# Patient Record
Sex: Female | Born: 1970 | Race: Black or African American | Hispanic: No | Marital: Single | Smoking: Current every day smoker
Health system: Southern US, Community
[De-identification: ages and names within clinical notes are randomized; demographics above are authoritative.]

## PROBLEM LIST (undated history)

## (undated) ENCOUNTER — Ambulatory Visit (HOSPITAL_COMMUNITY): Source: Home / Self Care

## (undated) DIAGNOSIS — F32A Depression, unspecified: Secondary | ICD-10-CM

## (undated) DIAGNOSIS — J45909 Unspecified asthma, uncomplicated: Secondary | ICD-10-CM

## (undated) DIAGNOSIS — F419 Anxiety disorder, unspecified: Secondary | ICD-10-CM

## (undated) DIAGNOSIS — G43019 Migraine without aura, intractable, without status migrainosus: Secondary | ICD-10-CM

## (undated) DIAGNOSIS — G4730 Sleep apnea, unspecified: Secondary | ICD-10-CM

## (undated) DIAGNOSIS — F329 Major depressive disorder, single episode, unspecified: Secondary | ICD-10-CM

## (undated) DIAGNOSIS — E785 Hyperlipidemia, unspecified: Secondary | ICD-10-CM

## (undated) DIAGNOSIS — I10 Essential (primary) hypertension: Secondary | ICD-10-CM

## (undated) HISTORY — DX: Hyperlipidemia, unspecified: E78.5

## (undated) HISTORY — DX: Unspecified asthma, uncomplicated: J45.909

## (undated) HISTORY — DX: Sleep apnea, unspecified: G47.30

## (undated) HISTORY — PX: OTHER SURGICAL HISTORY: SHX169

## (undated) HISTORY — PX: TUBAL LIGATION: SHX77

## (undated) HISTORY — DX: Migraine without aura, intractable, without status migrainosus: G43.019

## (undated) HISTORY — PX: CHOLECYSTECTOMY: SHX55

---

## 1997-06-23 ENCOUNTER — Inpatient Hospital Stay (HOSPITAL_COMMUNITY): Admission: AD | Admit: 1997-06-23 | Discharge: 1997-06-23 | Payer: Self-pay | Admitting: Family Medicine

## 1998-03-22 ENCOUNTER — Emergency Department (HOSPITAL_COMMUNITY): Admission: EM | Admit: 1998-03-22 | Discharge: 1998-03-22 | Payer: Self-pay | Admitting: Emergency Medicine

## 1998-04-29 ENCOUNTER — Inpatient Hospital Stay (HOSPITAL_COMMUNITY): Admission: AD | Admit: 1998-04-29 | Discharge: 1998-04-29 | Payer: Self-pay | Admitting: Family Medicine

## 1998-04-29 ENCOUNTER — Encounter: Payer: Self-pay | Admitting: Family Medicine

## 1998-04-30 ENCOUNTER — Emergency Department (HOSPITAL_COMMUNITY): Admission: EM | Admit: 1998-04-30 | Discharge: 1998-04-30 | Payer: Self-pay | Admitting: Emergency Medicine

## 1998-08-14 ENCOUNTER — Emergency Department (HOSPITAL_COMMUNITY): Admission: EM | Admit: 1998-08-14 | Discharge: 1998-08-14 | Payer: Self-pay | Admitting: Emergency Medicine

## 1998-08-14 ENCOUNTER — Encounter: Payer: Self-pay | Admitting: Emergency Medicine

## 1999-08-10 ENCOUNTER — Inpatient Hospital Stay (HOSPITAL_COMMUNITY): Admission: AD | Admit: 1999-08-10 | Discharge: 1999-08-10 | Payer: Self-pay | Admitting: Family Medicine

## 1999-08-10 ENCOUNTER — Encounter: Payer: Self-pay | Admitting: Obstetrics

## 1999-08-17 ENCOUNTER — Other Ambulatory Visit: Admission: RE | Admit: 1999-08-17 | Discharge: 1999-08-17 | Payer: Self-pay | Admitting: Obstetrics

## 1999-10-02 ENCOUNTER — Inpatient Hospital Stay (HOSPITAL_COMMUNITY): Admission: AD | Admit: 1999-10-02 | Discharge: 1999-10-02 | Payer: Self-pay | Admitting: Obstetrics

## 2000-01-24 ENCOUNTER — Inpatient Hospital Stay (HOSPITAL_COMMUNITY): Admission: AD | Admit: 2000-01-24 | Discharge: 2000-01-24 | Payer: Self-pay | Admitting: Obstetrics

## 2000-02-14 ENCOUNTER — Inpatient Hospital Stay (HOSPITAL_COMMUNITY): Admission: AD | Admit: 2000-02-14 | Discharge: 2000-02-14 | Payer: Self-pay | Admitting: Obstetrics

## 2000-03-15 ENCOUNTER — Encounter: Payer: Self-pay | Admitting: Obstetrics

## 2000-03-15 ENCOUNTER — Emergency Department (HOSPITAL_COMMUNITY): Admission: EM | Admit: 2000-03-15 | Discharge: 2000-03-15 | Payer: Self-pay | Admitting: Emergency Medicine

## 2000-03-21 ENCOUNTER — Inpatient Hospital Stay (HOSPITAL_COMMUNITY): Admission: AD | Admit: 2000-03-21 | Discharge: 2000-03-23 | Payer: Self-pay | Admitting: Obstetrics

## 2000-04-19 ENCOUNTER — Emergency Department (HOSPITAL_COMMUNITY): Admission: EM | Admit: 2000-04-19 | Discharge: 2000-04-19 | Payer: Self-pay | Admitting: Emergency Medicine

## 2000-06-21 ENCOUNTER — Ambulatory Visit (HOSPITAL_COMMUNITY): Admission: RE | Admit: 2000-06-21 | Discharge: 2000-06-21 | Payer: Self-pay | Admitting: Obstetrics

## 2000-08-01 ENCOUNTER — Encounter: Payer: Self-pay | Admitting: Emergency Medicine

## 2000-08-01 ENCOUNTER — Emergency Department (HOSPITAL_COMMUNITY): Admission: EM | Admit: 2000-08-01 | Discharge: 2000-08-01 | Payer: Self-pay | Admitting: Emergency Medicine

## 2000-09-07 ENCOUNTER — Encounter: Payer: Self-pay | Admitting: Emergency Medicine

## 2000-09-07 ENCOUNTER — Emergency Department (HOSPITAL_COMMUNITY): Admission: EM | Admit: 2000-09-07 | Discharge: 2000-09-07 | Payer: Self-pay | Admitting: Emergency Medicine

## 2001-03-07 ENCOUNTER — Inpatient Hospital Stay (HOSPITAL_COMMUNITY): Admission: AD | Admit: 2001-03-07 | Discharge: 2001-03-07 | Payer: Self-pay | Admitting: Obstetrics

## 2002-05-28 ENCOUNTER — Inpatient Hospital Stay (HOSPITAL_COMMUNITY): Admission: AD | Admit: 2002-05-28 | Discharge: 2002-05-28 | Payer: Self-pay | Admitting: Obstetrics

## 2002-06-09 ENCOUNTER — Encounter (INDEPENDENT_AMBULATORY_CARE_PROVIDER_SITE_OTHER): Payer: Self-pay | Admitting: Specialist

## 2002-06-09 ENCOUNTER — Ambulatory Visit (HOSPITAL_COMMUNITY): Admission: RE | Admit: 2002-06-09 | Discharge: 2002-06-10 | Payer: Self-pay | Admitting: General Surgery

## 2002-06-09 ENCOUNTER — Encounter (HOSPITAL_BASED_OUTPATIENT_CLINIC_OR_DEPARTMENT_OTHER): Payer: Self-pay | Admitting: General Surgery

## 2002-06-27 ENCOUNTER — Ambulatory Visit (HOSPITAL_COMMUNITY): Admission: RE | Admit: 2002-06-27 | Discharge: 2002-06-27 | Payer: Self-pay | Admitting: Urology

## 2002-06-27 ENCOUNTER — Encounter: Payer: Self-pay | Admitting: Urology

## 2003-01-15 ENCOUNTER — Encounter: Payer: Self-pay | Admitting: Emergency Medicine

## 2003-01-15 ENCOUNTER — Emergency Department (HOSPITAL_COMMUNITY): Admission: EM | Admit: 2003-01-15 | Discharge: 2003-01-15 | Payer: Self-pay

## 2003-02-27 ENCOUNTER — Ambulatory Visit (HOSPITAL_COMMUNITY): Admission: RE | Admit: 2003-02-27 | Discharge: 2003-02-27 | Payer: Self-pay | Admitting: Urology

## 2003-03-08 ENCOUNTER — Emergency Department (HOSPITAL_COMMUNITY): Admission: AD | Admit: 2003-03-08 | Discharge: 2003-03-08 | Payer: Self-pay | Admitting: Family Medicine

## 2003-05-08 ENCOUNTER — Emergency Department (HOSPITAL_COMMUNITY): Admission: EM | Admit: 2003-05-08 | Discharge: 2003-05-08 | Payer: Self-pay | Admitting: Family Medicine

## 2003-05-18 ENCOUNTER — Ambulatory Visit (HOSPITAL_COMMUNITY): Admission: RE | Admit: 2003-05-18 | Discharge: 2003-05-18 | Payer: Self-pay | Admitting: Urology

## 2003-06-05 ENCOUNTER — Ambulatory Visit (HOSPITAL_BASED_OUTPATIENT_CLINIC_OR_DEPARTMENT_OTHER): Admission: RE | Admit: 2003-06-05 | Discharge: 2003-06-05 | Payer: Self-pay | Admitting: Urology

## 2003-06-05 ENCOUNTER — Ambulatory Visit (HOSPITAL_COMMUNITY): Admission: RE | Admit: 2003-06-05 | Discharge: 2003-06-05 | Payer: Self-pay | Admitting: Urology

## 2003-06-05 ENCOUNTER — Encounter (INDEPENDENT_AMBULATORY_CARE_PROVIDER_SITE_OTHER): Payer: Self-pay | Admitting: Specialist

## 2003-06-09 ENCOUNTER — Ambulatory Visit (HOSPITAL_COMMUNITY): Admission: RE | Admit: 2003-06-09 | Discharge: 2003-06-09 | Payer: Self-pay | Admitting: Sports Medicine

## 2003-06-09 ENCOUNTER — Encounter: Payer: Self-pay | Admitting: Sports Medicine

## 2003-12-28 ENCOUNTER — Emergency Department (HOSPITAL_COMMUNITY): Admission: EM | Admit: 2003-12-28 | Discharge: 2003-12-28 | Payer: Self-pay | Admitting: Emergency Medicine

## 2004-01-13 ENCOUNTER — Emergency Department (HOSPITAL_COMMUNITY): Admission: EM | Admit: 2004-01-13 | Discharge: 2004-01-13 | Payer: Self-pay | Admitting: Emergency Medicine

## 2004-04-01 ENCOUNTER — Emergency Department (HOSPITAL_COMMUNITY): Admission: EM | Admit: 2004-04-01 | Discharge: 2004-04-01 | Payer: Self-pay

## 2004-04-24 ENCOUNTER — Emergency Department (HOSPITAL_COMMUNITY): Admission: EM | Admit: 2004-04-24 | Discharge: 2004-04-24 | Payer: Self-pay | Admitting: Emergency Medicine

## 2004-06-19 ENCOUNTER — Emergency Department (HOSPITAL_COMMUNITY): Admission: EM | Admit: 2004-06-19 | Discharge: 2004-06-19 | Payer: Self-pay | Admitting: Family Medicine

## 2004-08-18 ENCOUNTER — Emergency Department (HOSPITAL_COMMUNITY): Admission: EM | Admit: 2004-08-18 | Discharge: 2004-08-18 | Payer: Self-pay | Admitting: Emergency Medicine

## 2004-11-17 ENCOUNTER — Ambulatory Visit (HOSPITAL_COMMUNITY): Admission: RE | Admit: 2004-11-17 | Discharge: 2004-11-17 | Payer: Self-pay | Admitting: Obstetrics

## 2005-01-11 ENCOUNTER — Emergency Department (HOSPITAL_COMMUNITY): Admission: EM | Admit: 2005-01-11 | Discharge: 2005-01-11 | Payer: Self-pay | Admitting: Emergency Medicine

## 2005-04-08 ENCOUNTER — Emergency Department (HOSPITAL_COMMUNITY): Admission: EM | Admit: 2005-04-08 | Discharge: 2005-04-08 | Payer: Self-pay | Admitting: Emergency Medicine

## 2005-07-05 ENCOUNTER — Emergency Department (HOSPITAL_COMMUNITY): Admission: EM | Admit: 2005-07-05 | Discharge: 2005-07-05 | Payer: Self-pay | Admitting: Emergency Medicine

## 2005-11-15 ENCOUNTER — Emergency Department (HOSPITAL_COMMUNITY): Admission: RE | Admit: 2005-11-15 | Discharge: 2005-11-15 | Payer: Self-pay | Admitting: Emergency Medicine

## 2005-12-14 ENCOUNTER — Emergency Department (HOSPITAL_COMMUNITY): Admission: EM | Admit: 2005-12-14 | Discharge: 2005-12-14 | Payer: Self-pay | Admitting: Emergency Medicine

## 2006-07-03 ENCOUNTER — Emergency Department (HOSPITAL_COMMUNITY): Admission: EM | Admit: 2006-07-03 | Discharge: 2006-07-04 | Payer: Self-pay | Admitting: Emergency Medicine

## 2006-09-27 ENCOUNTER — Inpatient Hospital Stay (HOSPITAL_COMMUNITY): Admission: AD | Admit: 2006-09-27 | Discharge: 2006-09-27 | Payer: Self-pay | Admitting: Obstetrics

## 2006-11-08 ENCOUNTER — Ambulatory Visit (HOSPITAL_COMMUNITY): Admission: RE | Admit: 2006-11-08 | Discharge: 2006-11-08 | Payer: Self-pay | Admitting: Obstetrics

## 2007-02-13 ENCOUNTER — Emergency Department (HOSPITAL_COMMUNITY): Admission: EM | Admit: 2007-02-13 | Discharge: 2007-02-13 | Payer: Self-pay | Admitting: Emergency Medicine

## 2007-02-13 ENCOUNTER — Inpatient Hospital Stay (HOSPITAL_COMMUNITY): Admission: AD | Admit: 2007-02-13 | Discharge: 2007-02-13 | Payer: Self-pay | Admitting: Obstetrics

## 2007-08-04 ENCOUNTER — Emergency Department (HOSPITAL_COMMUNITY): Admission: EM | Admit: 2007-08-04 | Discharge: 2007-08-04 | Payer: Self-pay | Admitting: Emergency Medicine

## 2007-09-14 ENCOUNTER — Emergency Department (HOSPITAL_COMMUNITY): Admission: EM | Admit: 2007-09-14 | Discharge: 2007-09-14 | Payer: Self-pay | Admitting: Family Medicine

## 2007-09-27 ENCOUNTER — Inpatient Hospital Stay (HOSPITAL_COMMUNITY): Admission: AD | Admit: 2007-09-27 | Discharge: 2007-09-27 | Payer: Self-pay | Admitting: Obstetrics

## 2007-11-30 ENCOUNTER — Emergency Department (HOSPITAL_COMMUNITY): Admission: EM | Admit: 2007-11-30 | Discharge: 2007-11-30 | Payer: Self-pay | Admitting: Emergency Medicine

## 2008-03-14 ENCOUNTER — Emergency Department (HOSPITAL_COMMUNITY): Admission: EM | Admit: 2008-03-14 | Discharge: 2008-03-14 | Payer: Self-pay | Admitting: Emergency Medicine

## 2008-07-19 ENCOUNTER — Emergency Department (HOSPITAL_COMMUNITY): Admission: EM | Admit: 2008-07-19 | Discharge: 2008-07-19 | Payer: Self-pay | Admitting: Family Medicine

## 2008-07-22 ENCOUNTER — Emergency Department (HOSPITAL_COMMUNITY): Admission: EM | Admit: 2008-07-22 | Discharge: 2008-07-22 | Payer: Self-pay | Admitting: Family Medicine

## 2009-04-06 ENCOUNTER — Emergency Department (HOSPITAL_COMMUNITY): Admission: EM | Admit: 2009-04-06 | Discharge: 2009-04-06 | Payer: Self-pay | Admitting: Family Medicine

## 2009-04-12 ENCOUNTER — Emergency Department (HOSPITAL_COMMUNITY): Admission: EM | Admit: 2009-04-12 | Discharge: 2009-04-12 | Payer: Self-pay | Admitting: Family Medicine

## 2009-05-30 ENCOUNTER — Emergency Department (HOSPITAL_COMMUNITY): Admission: EM | Admit: 2009-05-30 | Discharge: 2009-05-30 | Payer: Self-pay | Admitting: Emergency Medicine

## 2009-07-20 ENCOUNTER — Emergency Department (HOSPITAL_COMMUNITY): Admission: EM | Admit: 2009-07-20 | Discharge: 2009-07-20 | Payer: Self-pay | Admitting: Family Medicine

## 2009-08-02 ENCOUNTER — Emergency Department (HOSPITAL_COMMUNITY): Admission: EM | Admit: 2009-08-02 | Discharge: 2009-08-02 | Payer: Self-pay | Admitting: Emergency Medicine

## 2009-09-06 ENCOUNTER — Emergency Department (HOSPITAL_COMMUNITY): Admission: EM | Admit: 2009-09-06 | Discharge: 2009-09-07 | Payer: Self-pay | Admitting: Emergency Medicine

## 2009-09-27 ENCOUNTER — Emergency Department (HOSPITAL_COMMUNITY)
Admission: EM | Admit: 2009-09-27 | Discharge: 2009-09-27 | Payer: Self-pay | Source: Home / Self Care | Admitting: Family Medicine

## 2009-12-22 ENCOUNTER — Emergency Department (HOSPITAL_COMMUNITY)
Admission: EM | Admit: 2009-12-22 | Discharge: 2009-12-22 | Payer: Self-pay | Source: Home / Self Care | Admitting: Emergency Medicine

## 2010-01-04 ENCOUNTER — Ambulatory Visit (HOSPITAL_BASED_OUTPATIENT_CLINIC_OR_DEPARTMENT_OTHER): Admission: RE | Admit: 2010-01-04 | Discharge: 2010-01-04 | Payer: Self-pay | Admitting: Internal Medicine

## 2010-01-08 ENCOUNTER — Ambulatory Visit: Payer: Self-pay | Admitting: Internal Medicine

## 2010-01-22 ENCOUNTER — Emergency Department (HOSPITAL_COMMUNITY)
Admission: EM | Admit: 2010-01-22 | Discharge: 2010-01-22 | Payer: Self-pay | Source: Home / Self Care | Admitting: Emergency Medicine

## 2010-01-24 ENCOUNTER — Emergency Department (HOSPITAL_COMMUNITY)
Admission: EM | Admit: 2010-01-24 | Discharge: 2010-01-24 | Payer: Self-pay | Source: Home / Self Care | Admitting: Emergency Medicine

## 2010-02-15 ENCOUNTER — Emergency Department (HOSPITAL_COMMUNITY): Admission: EM | Admit: 2010-02-15 | Discharge: 2010-02-15 | Payer: Self-pay | Admitting: Family Medicine

## 2010-04-30 ENCOUNTER — Encounter: Payer: Self-pay | Admitting: Obstetrics

## 2010-05-02 ENCOUNTER — Encounter: Payer: Self-pay | Admitting: Obstetrics

## 2010-06-22 LAB — URINALYSIS, ROUTINE W REFLEX MICROSCOPIC
Bilirubin Urine: NEGATIVE
Ketones, ur: NEGATIVE mg/dL
Nitrite: NEGATIVE
Protein, ur: NEGATIVE mg/dL
Urobilinogen, UA: 0.2 mg/dL (ref 0.0–1.0)

## 2010-08-12 ENCOUNTER — Other Ambulatory Visit: Payer: Self-pay | Admitting: Gastroenterology

## 2010-08-15 ENCOUNTER — Other Ambulatory Visit: Payer: Self-pay | Admitting: Gastroenterology

## 2010-08-19 ENCOUNTER — Other Ambulatory Visit: Payer: Self-pay

## 2010-08-26 NOTE — Op Note (Signed)
[facility]  Patient:    [name], [name]                       MRN: [id number] Adm. Date:  [id number] Attending:  Venita Sheffield                           Operative Report  PREOPERATIVE DIAGNOSIS:       Multiparity.  PROCEDURE:                    Open laparoscopic tubal sterilization.  SURGEON:                      Kathreen Cosier, M.D.  DESCRIPTION OF PROCEDURE:     Under general anesthesia with the patient in the lithotomy position the abdomen, perineum and vagina were prepped and draped and the bladder emptied with a straight catheter.  A weighted speculum was placed in the vagina and the anterior lip of the cervix grasped with a tenaculum.  A Hulka tenaculum was placed in the cervix.  An umbilical transverse incision was made and carried down to the fascia.  The fascia was cleaned, grasped with two Kochers and the fascia and the peritoneum opened with Mayo scissors.  The sleeve and the trocar were inserted intraperitoneally and 3 L of carbon dioxide was infused intraperitoneally.  The visualizing scope was inserted.  The uterus, tubes and ovaries were normal.  The cautery prove was inserted through the sleeve of the scope.  The right tube was grasped 1 inch from the cornua and cauterized.  The tube was cauterized until the ______ was moving lateral from the first site of cautery.  Approximately 2 inches of tube were cauterized.  The procedure was done in a similar fashion on the other side.  The probe was removed and CO2 gas was allowed to escape from the peritoneal cavity.  The fascia was closed with a subcuticular stitch of 3-0 plain.  The patient tolerated the procedure well and was taken to the recovery room in good condition. DD:  06/21/00 TD:  06/21/00 Job: [id number]

## 2010-08-26 NOTE — Op Note (Signed)
NAMEKESHANNA, RISO                          ACCOUNT NO.:  [id number]   MEDICAL RECORD NO.:  [id number]                   PATIENT TYPE:  OIB   LOCATION:  2550                                 FACILITY:  [facility]   PHYSICIAN:  Leonie Man, M.D.                DATE OF BIRTH:  1971/02/01   DATE OF PROCEDURE:  06/09/2002  DATE OF DISCHARGE:                                 OPERATIVE REPORT   PREOPERATIVE DIAGNOSIS:  Subacute cholecystitis and cholelithiasis.   POSTOPERATIVE DIAGNOSIS:  Subacute cholecystitis and cholelithiasis.   OPERATION:  Laparoscopic cholecystectomy with intraoperative cholangiogram.   SURGEON:  Leonie Man, M.D.   ASSISTANT:  Joanne Gavel, M.D.   ANESTHESIA:  General   INDICATIONS FOR PROCEDURE:  This patient is a 40 year old woman with a two  day history of recurring epigastric pain with nausea and emesis usually  occurring after meals.  No relief with Prevacid.  Abdominal ultrasound shows  cholelithiasis.  Recent history of increasing pain, nausea and vomiting.  No  history of fever, chills, or jaundice.  She comes to the operating room now  after the risks and potential benefits of laparoscopic cholecystectomy had  been fully discussed.  All questions answered and consent obtained.   DESCRIPTION OF PROCEDURE:  Following the induction of satisfactory general  anesthesia, the patient was positioned supinely.  The abdomen was routinely  prepped and draped to be included in the sterile operative field.  Open  laparoscopy created at the umbilicus with insufflation of the peritoneal  cavity through a Hasson cannula with 14 mmHg pressure.  Camera was inserted.  Medial exploration of the abdomen carried out.  Liver edge is sharp.  There  are multiple adhesions from the diaphragm down to the dome of the liver.  Gallbladder was noted to be intrahepatic and chronically scarred with some  acute inflammatory response near the ampulla with multiple adhesions around  the region of the ampulla.  Neither the small or large intestine appeared to  be abnormal.  No pelvic organs were visualized.   Under direct vision epigastrium and lap ports were placed.  The gallbladder  was grasped and retracted cephalad.  Dissection was carried down in the  region of the ampulla with observation of the cystic artery and cystic duct.  The cystic artery was traced to the gallbladder wall where it enters.  The  cystic duct is traced up to the gallbladder cystic duct junction.  The ring  of Calot's triangle was well visualized.  The cystic artery was doubly  clipped and transected.  The cystic duct was shifted proximally and opened.  A cystic duct cholangiogram was carried out by passing a 13 gauge  angiocatheter into the abdomen and a Reddick catheter was passed into the  cystic duct.  I injected 0.5% Isopaque into the biliary system under  fluoroscopic guidance.  The resulting cholangiogram shows free flow of  contrast into  the duodenum.  Normal tapering of the distal common bile duct.  No filling defects noted.  The upper radicles were normal.  The cystic duct  catheter was then removed.  The cystic duct was doubly clipped and  transected and the gallbladder was then dissected free from the liver bed  maintaining hemostasis throughout the course of dissection.  At the end of  the dissection, liver bed was again thoroughly inspected.  Additional  bleeding points were treated with electrocautery.  The right upper quadrant  was then thoroughly irrigated with normal saline.  Saline was aspirated.  Sponge, instrument and sharp counts verified.  Trocars were removed under  direct vision and the pneumoperitoneum allowed to deflate after the  gallbladder was retrieved from the abdomen with an EndoCatch.  Gallbladder  was forwarded for pathologic evaluation.  Wound was closed in layers as  follows:  The umbilical wound in two layers with 0 Dexon and 4-0 Dexon.  Epigastrium and  lateral flank was closed with 4-0 Dexon sutures and the  wound was then reinforced with Steri-Strips and a sterile dressing was  applied.  Anesthetic was reversed.  The patient removed from the operating  room to the recovery room in stable condition.  She tolerated the procedure  well.                                               Leonie Man, M.D.    PB/MEDQ  D:  06/09/2002  T:  06/09/2002  Job:  [id number]

## 2010-08-26 NOTE — Op Note (Signed)
[name], [name]                          ACCOUNT NO.:  [id number]   MEDICAL RECORD NO.:  [id number]                   PATIENT TYPE:  AMB   LOCATION:  [facility]                                 FACILITY:  [facility]   PHYSICIAN:  Lindaann Slough, M.D.               DATE OF BIRTH:  11-23-1970   DATE OF PROCEDURE:  06/05/2003  DATE OF DISCHARGE:                                 OPERATIVE REPORT   PREOPERATIVE DIAGNOSIS:  Urethral diverticulum.   POSTOPERATIVE DIAGNOSIS:  Urethral diverticulum.   PROCEDURES:  1. Urethral diverticulectomy.  2. Cystoscopy.   SURGEON:  Lindaann Slough, M.D.   ASSISTANT:  Susanne Borders, M.D.   ANESTHESIA:  General endotracheal plus local injection of vaginal mucosa  with 0.5% Marcaine with epinephrine.   ESTIMATED BLOOD LOSS:  50 mL.   SPECIMENS:  Urethral diverticulum to pathology.   COMPLICATIONS:  None.   DRAINS:  77 French Foley catheter to straight drain.   DISPOSITION:  Postanesthesia care unit in stable condition.   BRIEF INDICATION FOR PROCEDURE:  [name] is a 40 year old African-  American female who has had intermittent symptoms of dysuria and recurrent  urinary tract infections.  She was evaluated in the past with a voiding  cystourethrogram, which was essentially normal.  She has been treated  intermittently with antibiotics for her infections.  She was recently found  on physical examination to have a small mass protruding at her anterior  vaginal mucosa.  A repeat voiding cystourethrogram was obtained, which  demonstrated a small urethral diverticulum just to the left of the midline.  There was also a smaller diverticulum possibly on the right with  questionable communication with the left-sided diverticulum.  Because of the  patient's symptoms and recurrent infections, she was offered a urethral  diverticulectomy.  She consented to this after understanding the risks,  benefits, and alternatives.   PROCEDURE IN DETAIL:  The patient  was brought to the operating room and  correctly identified by her identification bracelet.  She was given  preoperative antibiotics and general endotracheal anesthesia.  She was  placed in the dorsal lithotomy position.  Her perineum and vagina were  prepped and draped in typical sterile fashion.  The labia majora were tacked  to the skin of the thighs for retraction with a 2-0 silk.  A weighted  vaginal speculum was placed in the posterior vagina.  Exam under anesthesia  again revealed a soft tissue mass just posterior to the urethra consistent  with the finding in the voiding cystourethrogram.  In the anterior vaginal  mucosa from just below the urethral meatus to approximately 4 cm proximal,  the vaginal mucosa was injected with 0.5% Marcaine with epinephrine.  This  resulted in creation of a tissue plane between the vaginal mucosa and the  urethra posteriorly.  An incision was made in the vaginal mucosa with a 15  blade scalpel.  Allis clamps were  used for lateral retraction of the vaginal  mucosa.  Strully scissors were then used to dissect the vaginal mucosa down  to the paraurethral tissues.  A transverse incision was then made in the  tissue overlying the diverticulum, and this was very carefully dissected  superiorly and inferiorly away from the diverticulum with Strully scissors.  This exposed the urethral diverticulum nicely, which was approximately 2 cm  in diameter.  We carefully dissected to the left and the right of the  largest diverticulum and there was no readily apparent smaller diverticulum,  although it could have been slightly more anterior and to the right of the  larger diverticulum.  A Foley catheter, which had been placed at the  beginning of the case, was removed and cystourethroscopy was performed with  a 22 French cystoscope sheath employing 12 degree lens.  The patient was  noted to have a somewhat elongated urethra.  At the level of the midurethra  on the  left and right side, there was an indentation posteriorly.  This was  actually more prominent on the right and the beak of the scope could be  torqued down into the actual diverticulum itself.  The scope could be  palpated through the diverticulum.  This was consistent with the large  diverticulum that we had dissected here.  The cystoscope was placed in the  bladder and a survey of the bladder revealed no mucosal abnormalities.  The  ureteral orifices were identified in their normal anatomic location.  The  cystoscope was removed.  The Strully scissors were then used to incise the  urethral diverticulum from the remainder of the urethra.  There appeared to  be some extra material to the right of the largest diverticulum, which might  have been the smaller subdiverticular pocket that was noted on the voiding  cystourethrogram.  This tissue was also excised.  Care was taken not to  excise excess urethral tissue for later closure.  Once the diverticulum was  satisfactorily resected, the area was carefully explored and the edges were  trimmed up for reapproximation of the urethra.  The Foley catheter was seen  below the urethral incision.  The transverse urethral incision was then  closed with interrupted 4-0 Vicryl sutures.  The cystoscope was then  replaced in the urethral meatus and irrigation was turned on.  The incision  was carefully inspected and appeared to be watertight.  Posteriorly there  was no longer the obvious urethral indentation consistent with the  diverticulum, as it had been excised.  The periurethral tissues overlying  the urethra were then reapproximated with a running 4-0 Vicryl in a  transverse fashion.  The incision was copiously irrigated with antibiotic  solution.  The vaginal mucosa was then reapproximated with a running 2-0  Vicryl.  The Foley catheter was connected to a leg bag.  The patient was awakened from her anesthesia without complications and taken to   postanesthesia care unit in stable condition, having tolerated the  procedure.  Please note that Dr. Brunilda Payor was present and participated in all  aspects of the case as he was the primary surgeon.     Susanne Borders, MD                           Lindaann Slough, M.D.    DR/MEDQ  D:  06/05/2003  T:  06/05/2003  Job:  [id number]

## 2010-08-29 ENCOUNTER — Emergency Department (HOSPITAL_COMMUNITY): Payer: Medicaid Other

## 2010-08-29 ENCOUNTER — Emergency Department (HOSPITAL_COMMUNITY)
Admission: EM | Admit: 2010-08-29 | Discharge: 2010-08-29 | Disposition: A | Discharge status: Home / Self Care | Payer: Medicaid Other | Attending: Emergency Medicine | Admitting: Emergency Medicine

## 2010-08-29 DIAGNOSIS — S139XXA Sprain of joints and ligaments of unspecified parts of neck, initial encounter: Secondary | ICD-10-CM | POA: Insufficient documentation

## 2010-08-29 DIAGNOSIS — E669 Obesity, unspecified: Secondary | ICD-10-CM | POA: Insufficient documentation

## 2010-08-29 DIAGNOSIS — S335XXA Sprain of ligaments of lumbar spine, initial encounter: Secondary | ICD-10-CM | POA: Insufficient documentation

## 2010-08-29 DIAGNOSIS — M545 Low back pain, unspecified: Secondary | ICD-10-CM | POA: Insufficient documentation

## 2010-08-29 DIAGNOSIS — M542 Cervicalgia: Secondary | ICD-10-CM | POA: Insufficient documentation

## 2010-09-07 ENCOUNTER — Inpatient Hospital Stay (HOSPITAL_COMMUNITY)
Admission: AD | Admit: 2010-09-07 | Discharge: 2010-09-07 | Disposition: A | Discharge status: Home / Self Care | Payer: Medicaid Other | Source: Ambulatory Visit | Attending: Obstetrics | Admitting: Obstetrics

## 2010-09-07 DIAGNOSIS — N368 Other specified disorders of urethra: Secondary | ICD-10-CM | POA: Insufficient documentation

## 2010-09-07 DIAGNOSIS — N949 Unspecified condition associated with female genital organs and menstrual cycle: Secondary | ICD-10-CM | POA: Insufficient documentation

## 2010-09-07 LAB — URINE MICROSCOPIC-ADD ON: WBC, UA: NONE SEEN WBC/hpf (ref <3)

## 2010-09-07 LAB — URINALYSIS, ROUTINE W REFLEX MICROSCOPIC
Bilirubin Urine: NEGATIVE
Glucose, UA: NEGATIVE mg/dL
Ketones, ur: NEGATIVE mg/dL
Nitrite: NEGATIVE
Specific Gravity, Urine: >1.03 — ABNORMAL HIGH (ref 1.005–1.030)
pH: 6 (ref 5.0–8.0)

## 2010-09-09 LAB — URINE CULTURE

## 2010-09-26 ENCOUNTER — Other Ambulatory Visit (HOSPITAL_COMMUNITY): Payer: Self-pay | Admitting: Urology

## 2010-09-26 DIAGNOSIS — N361 Urethral diverticulum: Secondary | ICD-10-CM

## 2010-10-04 ENCOUNTER — Ambulatory Visit (HOSPITAL_COMMUNITY)
Admission: RE | Admit: 2010-10-04 | Discharge: 2010-10-04 | Disposition: A | Discharge status: Home / Self Care | Payer: Medicaid Other | Source: Ambulatory Visit | Attending: Urology | Admitting: Urology

## 2010-10-04 DIAGNOSIS — N361 Urethral diverticulum: Secondary | ICD-10-CM | POA: Insufficient documentation

## 2010-10-04 MED ORDER — DIATRIZOATE MEGLUMINE 30 % UR SOLN
Freq: Once | URETHRAL | Status: AC | PRN
  Administered 2010-10-04: 210 mL

## 2010-10-06 ENCOUNTER — Emergency Department (HOSPITAL_COMMUNITY)
Admission: EM | Admit: 2010-10-06 | Discharge: 2010-10-07 | Disposition: A | Discharge status: Home / Self Care | Payer: Medicaid Other | Attending: Emergency Medicine | Admitting: Emergency Medicine

## 2010-10-06 DIAGNOSIS — I10 Essential (primary) hypertension: Secondary | ICD-10-CM | POA: Insufficient documentation

## 2010-10-06 DIAGNOSIS — E119 Type 2 diabetes mellitus without complications: Secondary | ICD-10-CM | POA: Insufficient documentation

## 2010-10-06 DIAGNOSIS — N9489 Other specified conditions associated with female genital organs and menstrual cycle: Secondary | ICD-10-CM | POA: Insufficient documentation

## 2010-10-06 DIAGNOSIS — R05 Cough: Secondary | ICD-10-CM | POA: Insufficient documentation

## 2010-10-06 DIAGNOSIS — R509 Fever, unspecified: Secondary | ICD-10-CM | POA: Insufficient documentation

## 2010-10-06 DIAGNOSIS — R059 Cough, unspecified: Secondary | ICD-10-CM | POA: Insufficient documentation

## 2010-10-07 ENCOUNTER — Encounter (HOSPITAL_COMMUNITY): Payer: Self-pay

## 2010-10-07 ENCOUNTER — Emergency Department (HOSPITAL_COMMUNITY): Payer: Medicaid Other

## 2010-10-11 ENCOUNTER — Ambulatory Visit (INDEPENDENT_AMBULATORY_CARE_PROVIDER_SITE_OTHER): Payer: Self-pay | Admitting: General Surgery

## 2010-10-17 ENCOUNTER — Emergency Department (HOSPITAL_COMMUNITY)
Admission: EM | Admit: 2010-10-17 | Discharge: 2010-10-18 | Disposition: A | Discharge status: Home / Self Care | Payer: Medicaid Other | Attending: Emergency Medicine | Admitting: Emergency Medicine

## 2010-10-17 DIAGNOSIS — E119 Type 2 diabetes mellitus without complications: Secondary | ICD-10-CM | POA: Insufficient documentation

## 2010-10-17 DIAGNOSIS — F411 Generalized anxiety disorder: Secondary | ICD-10-CM | POA: Insufficient documentation

## 2010-10-17 DIAGNOSIS — I10 Essential (primary) hypertension: Secondary | ICD-10-CM | POA: Insufficient documentation

## 2010-10-18 LAB — POCT I-STAT, CHEM 8
Calcium, Ion: 1.27 mmol/L (ref 1.12–1.32)
Chloride: 103 mEq/L (ref 96–112)
Creatinine, Ser: 0.7 mg/dL (ref 0.50–1.10)
Glucose, Bld: 136 mg/dL — ABNORMAL HIGH (ref 70–99)
HCT: 42 % (ref 36.0–46.0)

## 2010-10-18 LAB — CBC
HCT: 38.9 % (ref 36.0–46.0)
MCHC: 35.5 g/dL (ref 30.0–36.0)
MCV: 73.4 fL — ABNORMAL LOW (ref 78.0–100.0)
RDW: 14.6 % (ref 11.5–15.5)
WBC: 14.2 10*3/uL — ABNORMAL HIGH (ref 4.0–10.5)

## 2010-10-18 LAB — DIFFERENTIAL
Basophils Absolute: 0 10*3/uL (ref 0.0–0.1)
Eosinophils Relative: 1 % (ref 0–5)
Lymphocytes Relative: 32 % (ref 12–46)
Lymphs Abs: 4.5 10*3/uL — ABNORMAL HIGH (ref 0.7–4.0)
Monocytes Relative: 6 % (ref 3–12)

## 2010-10-19 ENCOUNTER — Telehealth (INDEPENDENT_AMBULATORY_CARE_PROVIDER_SITE_OTHER): Payer: Self-pay

## 2010-10-19 NOTE — Telephone Encounter (Signed)
i called [name] to reschedule her appointment on 10/20/10 per Dr Lindie Spruce because his schedule is overbooked and he has 2 cases after clinic. The only phone number that is working is [phone] and it goes straight to voicemail and the in box is full so I cannot leave a message. I was able to leave a message yesterday with her and I told her to call me back so I could reschedule it but I have not got a call from her.

## 2010-10-20 ENCOUNTER — Encounter (INDEPENDENT_AMBULATORY_CARE_PROVIDER_SITE_OTHER): Payer: Self-pay | Admitting: General Surgery

## 2010-10-21 ENCOUNTER — Ambulatory Visit (INDEPENDENT_AMBULATORY_CARE_PROVIDER_SITE_OTHER): Payer: Medicaid Other | Admitting: General Surgery

## 2011-01-17 LAB — COMPREHENSIVE METABOLIC PANEL
Albumin: 3.4 — ABNORMAL LOW
Alkaline Phosphatase: 81
BUN: 8
Creatinine, Ser: 0.57
Glucose, Bld: 83
Total Bilirubin: 0.6
Total Protein: 6.3

## 2011-01-17 LAB — WET PREP, GENITAL
Trich, Wet Prep: NONE SEEN
Yeast Wet Prep HPF POC: NONE SEEN

## 2011-01-17 LAB — DIFFERENTIAL
Basophils Absolute: 0.1
Basophils Relative: 0
Eosinophils Relative: 0
Monocytes Absolute: 0.8 — ABNORMAL HIGH
Monocytes Relative: 6

## 2011-01-17 LAB — CBC
Platelets: 264
RBC: 4.83
WBC: 14.3 — ABNORMAL HIGH

## 2011-01-17 LAB — URINE MICROSCOPIC-ADD ON

## 2011-01-17 LAB — GC/CHLAMYDIA PROBE AMP, GENITAL
Chlamydia, DNA Probe: NEGATIVE
GC Probe Amp, Genital: NEGATIVE

## 2011-01-17 LAB — URINALYSIS, ROUTINE W REFLEX MICROSCOPIC
Bilirubin Urine: NEGATIVE
Ketones, ur: NEGATIVE
Nitrite: NEGATIVE
pH: 6

## 2011-01-17 LAB — POCT PREGNANCY, URINE
Operator id: 220991
Preg Test, Ur: NEGATIVE

## 2011-01-25 LAB — URINALYSIS, ROUTINE W REFLEX MICROSCOPIC
Glucose, UA: NEGATIVE
Leukocytes, UA: NEGATIVE
Protein, ur: NEGATIVE
Specific Gravity, Urine: 1.025
Urobilinogen, UA: 0.2

## 2011-01-25 LAB — DIFFERENTIAL
Basophils Absolute: 0.1
Basophils Relative: 1
Eosinophils Absolute: 0.1
Eosinophils Relative: 1
Monocytes Absolute: 0.6
Monocytes Relative: 5

## 2011-01-25 LAB — GC/CHLAMYDIA PROBE AMP, GENITAL
Chlamydia, DNA Probe: NEGATIVE
GC Probe Amp, Genital: NEGATIVE

## 2011-01-25 LAB — CBC
Hemoglobin: 14.1
MCHC: 33.5
MCV: 77.1 — ABNORMAL LOW
RBC: 5.46 — ABNORMAL HIGH
RDW: 14.7 — ABNORMAL HIGH

## 2011-01-25 LAB — URINE MICROSCOPIC-ADD ON

## 2011-01-25 LAB — POCT PREGNANCY, URINE: Preg Test, Ur: NEGATIVE

## 2011-03-27 ENCOUNTER — Encounter (HOSPITAL_COMMUNITY): Payer: Self-pay | Admitting: Emergency Medicine

## 2011-03-27 ENCOUNTER — Emergency Department (HOSPITAL_COMMUNITY): Payer: Medicaid Other

## 2011-03-27 ENCOUNTER — Emergency Department (HOSPITAL_COMMUNITY)
Admission: EM | Admit: 2011-03-27 | Discharge: 2011-03-27 | Disposition: A | Discharge status: Home / Self Care | Payer: Medicaid Other | Attending: Emergency Medicine | Admitting: Emergency Medicine

## 2011-03-27 DIAGNOSIS — R062 Wheezing: Secondary | ICD-10-CM | POA: Insufficient documentation

## 2011-03-27 DIAGNOSIS — R21 Rash and other nonspecific skin eruption: Secondary | ICD-10-CM | POA: Insufficient documentation

## 2011-03-27 DIAGNOSIS — E119 Type 2 diabetes mellitus without complications: Secondary | ICD-10-CM | POA: Insufficient documentation

## 2011-03-27 DIAGNOSIS — B349 Viral infection, unspecified: Secondary | ICD-10-CM

## 2011-03-27 DIAGNOSIS — J40 Bronchitis, not specified as acute or chronic: Secondary | ICD-10-CM | POA: Insufficient documentation

## 2011-03-27 DIAGNOSIS — F17200 Nicotine dependence, unspecified, uncomplicated: Secondary | ICD-10-CM | POA: Insufficient documentation

## 2011-03-27 DIAGNOSIS — IMO0001 Reserved for inherently not codable concepts without codable children: Secondary | ICD-10-CM | POA: Insufficient documentation

## 2011-03-27 DIAGNOSIS — B9789 Other viral agents as the cause of diseases classified elsewhere: Secondary | ICD-10-CM | POA: Insufficient documentation

## 2011-03-27 DIAGNOSIS — I10 Essential (primary) hypertension: Secondary | ICD-10-CM | POA: Insufficient documentation

## 2011-03-27 HISTORY — DX: Essential (primary) hypertension: I10

## 2011-03-27 HISTORY — DX: Anxiety disorder, unspecified: F41.9

## 2011-03-27 HISTORY — DX: Depression, unspecified: F32.A

## 2011-03-27 HISTORY — DX: Major depressive disorder, single episode, unspecified: F32.9

## 2011-03-27 MED ORDER — ALBUTEROL SULFATE (5 MG/ML) 0.5% IN NEBU
5.0000 mg | INHALATION_SOLUTION | Freq: Once | RESPIRATORY_TRACT | Status: AC
  Administered 2011-03-27: 5 mg via RESPIRATORY_TRACT
  Filled 2011-03-27: qty 1

## 2011-03-27 MED ORDER — HYDROCOD POLST-CHLORPHEN POLST 10-8 MG/5ML PO LQCR: 5.0000 mL | Freq: Two times a day (BID) | ORAL | Status: DC | PRN

## 2011-03-27 MED ORDER — IPRATROPIUM BROMIDE 0.02 % IN SOLN
0.5000 mg | Freq: Once | RESPIRATORY_TRACT | Status: AC
  Administered 2011-03-27: 0.5 mg via RESPIRATORY_TRACT
  Filled 2011-03-27: qty 2.5

## 2011-03-27 NOTE — ED Notes (Signed)
Pt c/o cough, body aches states feels feverish but not registering on thermometer

## 2011-03-27 NOTE — ED Provider Notes (Signed)
History     CSN: [id number] Arrival date & time: 03/27/2011  5:37 PM   First MD Initiated Contact with Patient 03/27/11 1853      Chief Complaint  Patient presents with  . Cough     Patient is a 40 y.o. female presenting with cough.  Cough This is a recurrent problem. The current episode started more than 1 week ago. The problem occurs every few minutes. The problem has been gradually worsening. The cough is non-productive. There has been no fever. Associated symptoms include chills, headaches, myalgias and wheezing. Pertinent negatives include no ear pain.  Reports > 1 week of persistent dry cough that has been associated w/ other viral type symptoms. Has been using her inhaler as instructed for bronchitis but states coughing prevents her from getting any sleep. Pt admits she is a smoker.  Past Medical History  Diagnosis Date  . Hypertension   . Diabetes mellitus   . Depression   . Anxiety     History reviewed. No pertinent past surgical history.  History reviewed. No pertinent family history.  History  Substance Use Topics  . Smoking status: Current Everyday Smoker    Types: Cigarettes  . Smokeless tobacco: Not on file  . Alcohol Use: No    OB History    Grav Para Term Preterm Abortions TAB SAB Ect Mult Living                  Review of Systems  Constitutional: Positive for chills.  HENT: Negative for ear pain.   Respiratory: Positive for cough and wheezing.   Musculoskeletal: Positive for myalgias.  Neurological: Positive for headaches.    Allergies  Review of patient's allergies indicates no known allergies.  Home Medications   Current Outpatient Rx  Name Route Sig Dispense Refill  . ALBUTEROL SULFATE HFA 108 (90 BASE) MCG/ACT IN AERS Inhalation Inhale 2 puffs into the lungs every 6 (six) hours as needed. For shortness of breath.     . BECLOMETHASONE DIPROPIONATE 80 MCG/ACT IN AERS Inhalation Inhale 2 puffs into the lungs 2 (two) times daily.      Marland Kitchen  HYDROXYZINE HCL 25 MG PO TABS Oral Take 25 mg by mouth 2 (two) times daily as needed. For anxiety.     Marland Kitchen LISINOPRIL 5 MG PO TABS Oral Take 5 mg by mouth daily.      Marland Kitchen METFORMIN HCL 500 MG PO TABS Oral Take 500 mg by mouth 2 (two) times daily with a meal.      . ALKA-SELTZER PLUS COLD & FLU PO Oral Take 2 tablets by mouth 2 (two) times daily as needed. For cold symptoms.     . NYQUIL PO Oral Take 30 mLs by mouth every 6 (six) hours as needed. For cold symptoms.     . SERTRALINE HCL 50 MG PO TABS Oral Take 100 mg by mouth daily.        BP 138/88  Pulse 109  Temp(Src) 99.4 F (37.4 C) (Oral)  Resp 20  SpO2 100%  LMP 03/07/2011  Physical Exam  Constitutional: She is oriented to person, place, and time. She appears well-developed and well-nourished.  HENT:  Head: Normocephalic and atraumatic.  Eyes: Conjunctivae are normal.  Neck: Neck supple.  Cardiovascular: Normal rate and regular rhythm.   Pulmonary/Chest: Effort normal. She has wheezes.       Bil exp wheezes.  Abdominal: Soft. Bowel sounds are normal.  Musculoskeletal: Normal range of motion.  Neurological: She is  alert and oriented to person, place, and time.  Skin: Skin is warm and dry. Rash noted. Rash is papular. No erythema.  Psychiatric: She has a normal mood and affect.    ED Course  Procedures Findings and impression discussed w/ pt. Encouraged her to consider smoking cessation. Will plan for d/c home w/ short course of medication for cough.Pt agreeable w/ plan.  Labs Reviewed - No data to display Dg Chest 2 View  03/27/2011  *RADIOLOGY REPORT*  Clinical Data: Cough, fever  CHEST - 2 VIEW  Comparison:  10/07/2010  Findings:  The heart size and mediastinal contours are within normal limits.  Both lungs are clear.  The visualized skeletal structures are unremarkable.  IMPRESSION: No active cardiopulmonary disease.  Original Report Authenticated By: Judie Petit. Ruel Favors, M.D.     1. Viral syndrome   2. Bronchitis        MDM  Bronchitis/Viral syndrome   Medical screening examination/treatment/procedure(s) were performed by non-physician practitioner and as supervising physician I was immediately available for consultation/collaboration. Osvaldo Human, M.D.      Leanne Chang, NP 03/27/11 2132  Carleene Cooper III, MD 03/28/11 1100

## 2011-04-11 ENCOUNTER — Encounter (HOSPITAL_COMMUNITY): Payer: Self-pay | Admitting: *Deleted

## 2011-04-11 ENCOUNTER — Inpatient Hospital Stay (HOSPITAL_COMMUNITY)
Admission: AD | Admit: 2011-04-11 | Discharge: 2011-04-11 | Disposition: A | Discharge status: Home / Self Care | Payer: Medicaid Other | Source: Ambulatory Visit | Attending: Obstetrics | Admitting: Obstetrics

## 2011-04-11 ENCOUNTER — Emergency Department (INDEPENDENT_AMBULATORY_CARE_PROVIDER_SITE_OTHER)
Admission: EM | Admit: 2011-04-11 | Discharge: 2011-04-11 | Disposition: A | Discharge status: Nursing Home (Includes ICF) | Payer: Medicaid Other | Source: Home / Self Care

## 2011-04-11 DIAGNOSIS — N760 Acute vaginitis: Secondary | ICD-10-CM

## 2011-04-11 DIAGNOSIS — N949 Unspecified condition associated with female genital organs and menstrual cycle: Secondary | ICD-10-CM | POA: Insufficient documentation

## 2011-04-11 DIAGNOSIS — N368 Other specified disorders of urethra: Secondary | ICD-10-CM

## 2011-04-11 LAB — WET PREP, GENITAL

## 2011-04-11 MED ORDER — CEPHALEXIN 500 MG PO CAPS: 500.0000 mg | ORAL_CAPSULE | Freq: Four times a day (QID) | ORAL | Status: AC

## 2011-04-11 MED ORDER — HYDROMORPHONE HCL 4 MG PO TABS: 4.0000 mg | ORAL_TABLET | ORAL | Status: AC | PRN

## 2011-04-11 MED ORDER — CEFTRIAXONE SODIUM 1 G IJ SOLR
1.0000 g | Freq: Once | INTRAMUSCULAR | Status: AC
  Administered 2011-04-11: 1 g via INTRAMUSCULAR
  Filled 2011-04-11: qty 10

## 2011-04-11 MED ORDER — HYDROMORPHONE HCL PF 1 MG/ML IJ SOLN
2.0000 mg | Freq: Once | INTRAMUSCULAR | Status: AC
  Administered 2011-04-11: 2 mg via INTRAMUSCULAR
  Filled 2011-04-11: qty 2

## 2011-04-11 MED ORDER — IBUPROFEN 800 MG PO TABS
ORAL_TABLET | ORAL | Status: AC
  Filled 2011-04-11: qty 1

## 2011-04-11 MED ORDER — IBUPROFEN 800 MG PO TABS
800.0000 mg | ORAL_TABLET | Freq: Once | ORAL | Status: AC
  Administered 2011-04-11: 800 mg via ORAL

## 2011-04-11 NOTE — ED Notes (Signed)
PT  REPORTS  SJ=HE  HAS  SWOLLEN PAINFULL  HARD  AREA  IN  VAGINAL  REGION  SHE  REPORT   THE SYMPTOMS    X  SEV  WEEKS  GETTING  WORSE

## 2011-04-11 NOTE — Progress Notes (Signed)
E. Rice, PA at bedside.  Assessment done and poc discussed with pt.   

## 2011-04-11 NOTE — ED Provider Notes (Signed)
History     CSN: [id number]  Arrival date & time 04/11/11  1926   None     Chief Complaint  Patient presents with  . Groin Swelling    (Consider location/radiation/quality/duration/timing/severity/associated sxs/prior treatment) HPI Comments: Pt states she began having pain in her vaginal area 2 mos ago. She was seen by her PCP, Dr Harriett Sine and was unable to find anything causing her pain. Yesterday she developed a hard swollen painful lump in the area where she has been having pain, but the pain is severe, much worse than the last couple months. No discharge. She is urinating, but smaller amounts and is more difficult to go. No fever or chills. She states he had a urethral cyst years ago but was not this painful or swollen.  The history is provided by the patient.    Past Medical History  Diagnosis Date  . Hypertension   . Diabetes mellitus   . Depression   . Anxiety     Past Surgical History  Procedure Date  . Btl     History reviewed. No pertinent family history.  History  Substance Use Topics  . Smoking status: Current Everyday Smoker    Types: Cigarettes  . Smokeless tobacco: Not on file  . Alcohol Use: No    OB History    Grav Para Term Preterm Abortions TAB SAB Ect Mult Living                  Review of Systems  Constitutional: Negative for fever and chills.  Genitourinary: Positive for decreased urine volume and vaginal pain. Negative for dysuria, frequency, vaginal bleeding, vaginal discharge and pelvic pain.    Allergies  Review of patient's allergies indicates no known allergies.  Home Medications   Current Outpatient Rx  Name Route Sig Dispense Refill  . ALBUTEROL SULFATE HFA 108 (90 BASE) MCG/ACT IN AERS Inhalation Inhale 2 puffs into the lungs every 6 (six) hours as needed. For shortness of breath.     . BECLOMETHASONE DIPROPIONATE 80 MCG/ACT IN AERS Inhalation Inhale 2 puffs into the lungs 2 (two) times daily.      Marland Kitchen HYDROCOD POLST-CPM POLST  ER 10-8 MG/5ML PO LQCR Oral Take 5 mLs by mouth every 12 (twelve) hours as needed. 30 mL 0  . HYDROXYZINE HCL 25 MG PO TABS Oral Take 25 mg by mouth 2 (two) times daily as needed. For anxiety.     Marland Kitchen LISINOPRIL 5 MG PO TABS Oral Take 5 mg by mouth daily.      Marland Kitchen METFORMIN HCL 500 MG PO TABS Oral Take 500 mg by mouth 2 (two) times daily with a meal.      . ALKA-SELTZER PLUS COLD & FLU PO Oral Take 2 tablets by mouth 2 (two) times daily as needed. For cold symptoms.     . NYQUIL PO Oral Take 30 mLs by mouth every 6 (six) hours as needed. For cold symptoms.     . SERTRALINE HCL 50 MG PO TABS Oral Take 100 mg by mouth daily.        BP 148/96  Pulse 102  Temp(Src) 98 F (36.7 C) (Oral)  Resp 22  SpO2 98%  LMP 03/07/2011  Physical Exam  Nursing note and vitals reviewed. Constitutional: She appears well-developed and well-nourished. No distress.  Genitourinary:    There is no rash, tenderness or lesion on the right labia. There is no rash, tenderness or lesion on the left labia. There is tenderness around  the vagina. No bleeding around the vagina. No signs of injury around the vagina. No vaginal discharge found.  Neurological: She is alert.  Skin: Skin is warm and dry.  Psychiatric: She has a normal mood and affect.    ED Course  Procedures (including critical care time)  Labs Reviewed - No data to display No results found.   1. Abscess of vagina       MDM  Pt discharged to go by private vehicle to [facility] ED for evaluation & treatment.         Melody Comas, [place] 04/11/11 2134

## 2011-04-11 NOTE — Progress Notes (Signed)
Pt presents to [facility] from urgent care for vaginal pain.  Brought back from lobby for c/o pain.  Pt talking on cell phone and ambulating without difficulty.

## 2011-04-11 NOTE — ED Provider Notes (Signed)
History   Pt presents today c/o severe vag pain. She states the pain first began about 5 months ago. She was diagnosed with a urethrocele and saw Dr. Gaynell Face who referred her to urology. She states she saw the urologist about 2 months ago but her sx had somewhat resolved at that time. However, recently her pain has worsened and she has developed a "hard knot." She denies fever, abd pain, or any other sx. She does request STD testing.  Chief Complaint  Patient presents with  . Pain   HPI  OB History    Grav Para Term Preterm Abortions TAB SAB Ect Mult Living                  Past Medical History  Diagnosis Date  . Hypertension   . Diabetes mellitus   . Depression   . Anxiety     Past Surgical History  Procedure Date  . Btl     No family history on file.  History  Substance Use Topics  . Smoking status: Current Everyday Smoker    Types: Cigarettes  . Smokeless tobacco: Not on file  . Alcohol Use: No    Allergies: No Known Allergies  Prescriptions prior to admission  Medication Sig Dispense Refill  . albuterol (PROVENTIL HFA;VENTOLIN HFA) 108 (90 BASE) MCG/ACT inhaler Inhale 2 puffs into the lungs 2 (two) times daily as needed. For shortness of breath.      . beclomethasone (QVAR) 80 MCG/ACT inhaler Inhale 2 puffs into the lungs 2 (two) times daily.        . hydrOXYzine (ATARAX/VISTARIL) 25 MG tablet Take 25 mg by mouth 2 (two) times daily as needed. For anxiety.       Marland Kitchen lisinopril (PRINIVIL,ZESTRIL) 5 MG tablet Take 5 mg by mouth daily.        . metFORMIN (GLUCOPHAGE) 500 MG tablet Take 500 mg by mouth 2 (two) times daily with a meal.        . sertraline (ZOLOFT) 50 MG tablet Take 100 mg by mouth daily.        Marland Kitchen DISCONTD: chlorpheniramine-HYDROcodone (TUSSIONEX PENNKINETIC ER) 10-8 MG/5ML LQCR Take 5 mLs by mouth every 12 (twelve) hours as needed.  30 mL  0    Review of Systems  Constitutional: Negative for fever.  Eyes: Negative for blurred vision.    Cardiovascular: Negative for chest pain and palpitations.  Gastrointestinal: Negative for nausea, vomiting, abdominal pain, diarrhea and constipation.  Genitourinary: Negative for dysuria, urgency, frequency and hematuria.  Neurological: Negative for dizziness and headaches.  Psychiatric/Behavioral: Negative for depression and suicidal ideas.   Physical Exam   Blood pressure 153/56, pulse 96, temperature 99.3 F (37.4 C), temperature source Oral, resp. rate 22, height 5\' 5"  (1.651 m), weight 265 lb (120.203 kg), last menstrual period 03/15/2011.  Physical Exam  Nursing note and vitals reviewed. Constitutional: She is oriented to person, place, and time. She appears well-developed and well-nourished. No distress.  HENT:  Head: Normocephalic and atraumatic.  Eyes: EOM are normal. Pupils are equal, round, and reactive to light.  GI: Soft. She exhibits no distension and no mass. There is no tenderness. There is no rebound and no guarding.  Genitourinary: Vaginal discharge found.       Pt with what appears to be a large, firm urethrocele. Pt exquisitely tender to palpation.  Neurological: She is alert and oriented to person, place, and time.  Skin: Skin is warm and dry. She is not diaphoretic.  Psychiatric: She has a normal mood and affect. Her behavior is normal. Judgment and thought content normal.    MAU Course  Procedures  Wet prep done.  Results for orders placed during the hospital encounter of 04/11/11 (from the past 24 hour(s))  WET PREP, GENITAL     Status: Abnormal   Collection Time   04/11/11 10:10 PM      Component Value Range   Yeast, Wet Prep NONE SEEN  NONE SEEN    Trich, Wet Prep NONE SEEN  NONE SEEN    Clue Cells, Wet Prep NONE SEEN  NONE SEEN    WBC, Wet Prep HPF POC FEW (*) NONE SEEN    Discussed pt with Dr. Clearance Coots. Pt to f/u with Dr. Gaynell Face and urology.  Assessment and Plan  Urethrocele: pt received IM Rocephin and will be given Rx for keflex and dilaudid.  She will f/u with Dr. Gaynell Face in the am. Discussed diet, activity, risks, and precautions.  Clinton Gallant. Rice III, DrHSc, MPAS, PA-C  04/11/2011, 10:17 PM   Henrietta Hoover, PA 04/11/11 2332

## 2011-04-13 ENCOUNTER — Other Ambulatory Visit (HOSPITAL_COMMUNITY): Payer: Self-pay | Admitting: Urology

## 2011-04-13 DIAGNOSIS — N361 Urethral diverticulum: Secondary | ICD-10-CM

## 2011-04-13 LAB — GC/CHLAMYDIA PROBE AMP, URINE: Chlamydia, Swab/Urine, PCR: NEGATIVE

## 2011-04-15 NOTE — ED Provider Notes (Signed)
Medical screening examination/treatment/procedure(s) were performed by non-physician practitioner and as supervising physician I was immediately available for consultation/collaboration.  Luiz Blare MD   Luiz Blare, MD 04/15/11 [phone]

## 2011-04-17 ENCOUNTER — Inpatient Hospital Stay (HOSPITAL_COMMUNITY)
Admission: AD | Admit: 2011-04-17 | Discharge: 2011-04-17 | Disposition: A | Discharge status: Home / Self Care | Payer: Medicaid Other | Source: Ambulatory Visit | Attending: Obstetrics and Gynecology | Admitting: Obstetrics and Gynecology

## 2011-04-17 ENCOUNTER — Encounter (HOSPITAL_COMMUNITY): Payer: Self-pay | Admitting: *Deleted

## 2011-04-17 DIAGNOSIS — B379 Candidiasis, unspecified: Secondary | ICD-10-CM

## 2011-04-17 DIAGNOSIS — B3731 Acute candidiasis of vulva and vagina: Secondary | ICD-10-CM | POA: Insufficient documentation

## 2011-04-17 DIAGNOSIS — B373 Candidiasis of vulva and vagina: Secondary | ICD-10-CM | POA: Insufficient documentation

## 2011-04-17 DIAGNOSIS — L293 Anogenital pruritus, unspecified: Secondary | ICD-10-CM | POA: Insufficient documentation

## 2011-04-17 DIAGNOSIS — N898 Other specified noninflammatory disorders of vagina: Secondary | ICD-10-CM

## 2011-04-17 DIAGNOSIS — N899 Noninflammatory disorder of vagina, unspecified: Secondary | ICD-10-CM

## 2011-04-17 LAB — WET PREP, GENITAL: Yeast Wet Prep HPF POC: NONE SEEN

## 2011-04-17 LAB — HERPES SIMPLEX VIRUS CULTURE

## 2011-04-17 MED ORDER — NYSTATIN-TRIAMCINOLONE 100000-0.1 UNIT/GM-% EX OINT: TOPICAL_OINTMENT | Freq: Two times a day (BID) | CUTANEOUS | Status: AC

## 2011-04-17 MED ORDER — KETOROLAC TROMETHAMINE 60 MG/2ML IM SOLN
60.0000 mg | Freq: Once | INTRAMUSCULAR | Status: AC
  Administered 2011-04-17: 60 mg via INTRAMUSCULAR
  Filled 2011-04-17: qty 2

## 2011-04-17 MED ORDER — FLUCONAZOLE 150 MG PO TABS
150.0000 mg | ORAL_TABLET | Freq: Once | ORAL | Status: AC
  Administered 2011-04-17: 150 mg via ORAL
  Filled 2011-04-17: qty 1

## 2011-04-17 NOTE — ED Provider Notes (Signed)
History     CSN: [id number]  Arrival date & time 04/17/11  1858   None     Chief Complaint  Patient presents with  . Vaginal Itching    HPI [name] IS a 41 y.o. female who presents to [facility] for vaginal itching and burning that started 2 days ago. She was here 04/11/11 for vaginal discharge and had cultures for GC and Chlamydia that were negative. The itching and burning have gotten worse. Current sex partner x 1 year, no history of STD's. She has an appointment with Dr. Julien Girt (urology) tomorrow for ? urethral cyst. The history was provided by the patient.  Past Medical History  Diagnosis Date  . Hypertension   . Diabetes mellitus   . Depression   . Anxiety     Past Surgical History  Procedure Date  . Btl     No family history on file.  History  Substance Use Topics  . Smoking status: Current Everyday Smoker    Types: Cigarettes  . Smokeless tobacco: Not on file  . Alcohol Use: No    OB History    Grav Para Term Preterm Abortions TAB SAB Ect Mult Living                  Review of Systems  Constitutional: Negative for fever, chills, diaphoresis and fatigue.  HENT: Negative for ear pain, congestion, sore throat, facial swelling, neck pain, neck stiffness, dental problem and sinus pressure.   Eyes: Negative for photophobia, pain and discharge.  Respiratory: Negative for cough, chest tightness and wheezing.   Gastrointestinal: Negative for nausea, vomiting, abdominal pain, diarrhea, constipation and abdominal distention.  Genitourinary: Positive for vaginal discharge and vaginal pain. Negative for dysuria, frequency, flank pain, vaginal bleeding and difficulty urinating.  Musculoskeletal: Negative for myalgias, back pain and gait problem.  Skin: Negative for color change and rash.  Neurological: Negative for dizziness, speech difficulty, weakness, light-headedness, numbness and headaches.  Psychiatric/Behavioral: Negative for confusion and agitation.    Allergies   Review of patient's allergies indicates no known allergies.  Home Medications  No current outpatient prescriptions on file.  BP 132/61  Pulse 75  Temp 98.5 F (36.9 C)  Resp 20  Ht 5' 5.5" (1.664 m)  Wt 269 lb (122.018 kg)  BMI 44.08 kg/m2  LMP 04/06/2011  Physical Exam  Nursing note and vitals reviewed. Constitutional: She is oriented to person, place, and time. No distress.       Obese, A/A female  HENT:  Head: Normocephalic.  Eyes: EOM are normal.  Neck: Neck supple.  Cardiovascular: Normal rate.   Pulmonary/Chest: Effort normal.  Abdominal: Soft. There is no tenderness.  Genitourinary:       External genitalia without lesions. Erythema and irritation in area of urethra and urinary medius. Minimal tenderness on exam. White discharge vaginal vault. No cervical lesions noted, no CMT, no adnexal tenderness. Uterus without palpable enlargement, exam limited due to patient habitus.    Musculoskeletal: Normal range of motion.  Neurological: She is alert and oriented to person, place, and time. No cranial nerve deficit.  Skin: Skin is warm and dry.  Psychiatric: She has a normal mood and affect. Her behavior is normal. Judgment and thought content normal.    Assessment: Monilia vaginitis  Plan:  Culture for HSV   Toradol 60 mg IM x 1   Diflucan 150 mg po   Mycolog Cream Rx   Keep appointment with urologist tomorrow ED Course  Procedures  MDM          Kerrie Buffalo, NP 04/17/11 2144

## 2011-04-17 NOTE — Progress Notes (Signed)
Pt reports she was here last week and she was told she has a "cyst in my urethra" and was put on antibiotics. Today started having a itching, irritation in vaginal area and noticed a "blister" on labia. Denies fever.

## 2011-04-18 NOTE — ED Provider Notes (Signed)
Agree with above note.  [name] 04/18/2011 7:23 AM

## 2011-04-19 ENCOUNTER — Ambulatory Visit (HOSPITAL_COMMUNITY)
Admission: RE | Admit: 2011-04-19 | Discharge: 2011-04-19 | Disposition: A | Discharge status: Home / Self Care | Payer: Medicaid Other | Source: Ambulatory Visit | Attending: Urology | Admitting: Urology

## 2011-04-19 DIAGNOSIS — N750 Cyst of Bartholin's gland: Secondary | ICD-10-CM | POA: Insufficient documentation

## 2011-04-19 DIAGNOSIS — N9489 Other specified conditions associated with female genital organs and menstrual cycle: Secondary | ICD-10-CM | POA: Insufficient documentation

## 2011-04-19 DIAGNOSIS — N361 Urethral diverticulum: Secondary | ICD-10-CM

## 2011-04-19 DIAGNOSIS — N83209 Unspecified ovarian cyst, unspecified side: Secondary | ICD-10-CM | POA: Insufficient documentation

## 2011-04-19 LAB — CREATININE, SERUM
Creatinine, Ser: 0.7 mg/dL (ref 0.50–1.10)
GFR calc Af Amer: >90 mL/min (ref >90)
GFR calc non Af Amer: >90 mL/min (ref >90)

## 2011-04-19 MED ORDER — GADOBENATE DIMEGLUMINE 529 MG/ML IV SOLN
20.0000 mL | Freq: Once | INTRAVENOUS | Status: AC | PRN
  Administered 2011-04-19: 20 mL via INTRAVENOUS

## 2014-02-09 ENCOUNTER — Encounter (HOSPITAL_COMMUNITY): Payer: Self-pay | Admitting: *Deleted

## 2016-02-07 ENCOUNTER — Encounter (HOSPITAL_COMMUNITY): Payer: Self-pay | Admitting: Family Medicine

## 2016-02-07 ENCOUNTER — Ambulatory Visit (HOSPITAL_COMMUNITY)
Admission: EM | Admit: 2016-02-07 | Discharge: 2016-02-07 | Disposition: A | Discharge status: Home / Self Care | Payer: Medicaid Other | Attending: Emergency Medicine | Admitting: Emergency Medicine

## 2016-02-07 DIAGNOSIS — E139 Other specified diabetes mellitus without complications: Secondary | ICD-10-CM

## 2016-02-07 DIAGNOSIS — E109 Type 1 diabetes mellitus without complications: Secondary | ICD-10-CM

## 2016-02-07 DIAGNOSIS — J40 Bronchitis, not specified as acute or chronic: Secondary | ICD-10-CM

## 2016-02-07 DIAGNOSIS — Z760 Encounter for issue of repeat prescription: Secondary | ICD-10-CM

## 2016-02-07 MED ORDER — AZITHROMYCIN 250 MG PO TABS
250.0000 mg | ORAL_TABLET | Freq: Every day | ORAL | 0 refills | Status: DC
Start: 2016-02-07 — End: 2016-03-09

## 2016-02-07 MED ORDER — "INSULIN SYRINGE 29G X 1/2"" 0.5 ML MISC": 1.0000 | Freq: Two times a day (BID) | 0 refills | Status: DC

## 2016-02-07 MED ORDER — BENZONATATE 100 MG PO CAPS: 200.0000 mg | ORAL_CAPSULE | Freq: Three times a day (TID) | ORAL | 0 refills | Status: DC | PRN

## 2016-02-07 MED ORDER — INSULIN NPH ISOPHANE & REGULAR (70-30) 100 UNIT/ML ~~LOC~~ SUSP: 28.0000 [IU] | Freq: Two times a day (BID) | SUBCUTANEOUS | 0 refills | Status: DC

## 2016-02-07 NOTE — ED Triage Notes (Signed)
Pt sts that she hasn't had her insulin for a week. sts that she takes 70/30. sts she also has metformin.

## 2016-02-07 NOTE — ED Provider Notes (Signed)
CSN: [id number]     Arrival date & time 02/07/16  1641 History   None    Chief Complaint  Patient presents with  . Medication Refill   (Consider location/radiation/quality/duration/timing/severity/associated sxs/prior Treatment) Patient states she just got out of jail and she does not have her insulin.  She takes novolin 70/30 28 units bid and she needs refill.  She also needs refill on her insulin needles.  She is also c/o cough and congestion.  She is a smoker.  She has had cough for over 3 weeks.   The history is provided by the patient.  Medication Refill  Medications/supplies requested:  Insulin novolog 70/30 Reason for request:  Clinic/provider not available Medications taken before: yes - see home medications   Cough  Cough characteristics:  Productive Sputum characteristics:  Yellow Severity:  Moderate Onset quality:  Sudden Duration:  3 weeks Timing:  Constant Progression:  Worsening Chronicity:  New Smoker: yes   Context: smoke exposure and upper respiratory infection   Relieved by:  Nothing Worsened by:  Nothing Ineffective treatments:  None tried Associated symptoms: chills, shortness of breath, sore throat and wheezing     Past Medical History:  Diagnosis Date  . Anxiety   . Depression   . Diabetes mellitus   . Hypertension    Past Surgical History:  Procedure Laterality Date  . BTL     History reviewed. No pertinent family history. Social History  Substance Use Topics  . Smoking status: Current Every Day Smoker    Types: Cigarettes  . Smokeless tobacco: Never Used  . Alcohol use No   OB History    Gravida Para Term Preterm AB Living   5       2 3    SAB TAB Ectopic Multiple Live Births   1 1           Review of Systems  Constitutional: Positive for chills.  HENT: Positive for sore throat.   Eyes: Negative.   Respiratory: Positive for cough, shortness of breath and wheezing.   Gastrointestinal: Negative.   Endocrine: Negative.    Genitourinary: Negative.   Musculoskeletal: Negative.   Skin: Negative.   Allergic/Immunologic: Negative.   Neurological: Negative.   Hematological: Negative.   Psychiatric/Behavioral: Negative.     Allergies  Review of patient's allergies indicates no known allergies.  Home Medications   Prior to Admission medications   Medication Sig Start Date End Date Taking? Authorizing Provider  insulin aspart protamine- aspart (NOVOLOG MIX 70/30) (70-30) 100 UNIT/ML injection Inject into the skin.   Yes Historical Provider, MD  albuterol (PROVENTIL HFA;VENTOLIN HFA) 108 (90 BASE) MCG/ACT inhaler Inhale 2 puffs into the lungs 2 (two) times daily as needed. For shortness of breath.    Historical Provider, MD  beclomethasone (QVAR) 80 MCG/ACT inhaler Inhale 2 puffs into the lungs 2 (two) times daily.      Historical Provider, MD  hydrOXYzine (ATARAX/VISTARIL) 25 MG tablet Take 25 mg by mouth 2 (two) times daily as needed. For anxiety.     Historical Provider, MD  insulin NPH-regular Human (NOVOLIN 70/30) (70-30) 100 UNIT/ML injection Inject 28 Units into the skin 2 (two) times daily with a meal. 02/07/16   Deatra CanterWilliam J Oxford, FNP  INSULIN SYRINGE .5CC/29G (SAFETY INSULIN SYRINGES) 29G X 1/2" 0.5 ML MISC 1 Syringe by Does not apply route 2 (two) times daily. 02/07/16   Deatra CanterWilliam J Oxford, FNP  lisinopril (PRINIVIL,ZESTRIL) 5 MG tablet Take 5 mg by mouth daily.  Historical Provider, MD  metFORMIN (GLUCOPHAGE) 500 MG tablet Take 500 mg by mouth 2 (two) times daily with a meal.      Historical Provider, MD  sertraline (ZOLOFT) 50 MG tablet Take 100 mg by mouth daily.      Historical Provider, MD   Meds Ordered and Administered this Visit  Medications - No data to display  BP 112/57 (BP Location: Left Arm)   Pulse 89   Temp 98.2 F (36.8 C) (Oral)   Resp 12   SpO2 100%  No data found.   Physical Exam  Constitutional: She appears well-developed and well-nourished.  HENT:  Head: Normocephalic  and atraumatic.  Right Ear: External ear normal.  Left Ear: External ear normal.  Mouth/Throat: Oropharynx is clear and moist.  Eyes: Conjunctivae and EOM are normal. Pupils are equal, round, and reactive to light.  Neck: Normal range of motion. Neck supple.  Cardiovascular: Normal rate, regular rhythm and normal heart sounds.   Pulmonary/Chest: Effort normal and breath sounds normal.  Nursing note and vitals reviewed.   Urgent Care Course   Clinical Course    Procedures (including critical care time)  Labs Review Labs Reviewed - No data to display  Imaging Review No results found.   Visual Acuity Review  Right Eye Distance:   Left Eye Distance:   Bilateral Distance:    Right Eye Near:   Left Eye Near:    Bilateral Near:         MDM   1. Medication refill   2. Diabetes 1.5, managed as type 2 (HCC)   3. Bronchitis   Zpak as directed #6 Tessalon Perles 200mg  one po tid prn #21 Push po fluids, rest, tylenol and motrin otc prn as directed for fever, arthralgias, and myalgias.  Follow up prn if sx's continue or persist.    Deatra CanterWilliam J Oxford, FNP 02/07/16 1755    Deatra CanterWilliam J Oxford, FNP 02/07/16 [phone]

## 2016-03-09 ENCOUNTER — Ambulatory Visit (HOSPITAL_COMMUNITY)
Admission: EM | Admit: 2016-03-09 | Discharge: 2016-03-09 | Disposition: A | Discharge status: Home / Self Care | Payer: Medicaid Other | Attending: Family Medicine | Admitting: Family Medicine

## 2016-03-09 ENCOUNTER — Encounter (HOSPITAL_COMMUNITY): Payer: Self-pay | Admitting: Emergency Medicine

## 2016-03-09 DIAGNOSIS — Z794 Long term (current) use of insulin: Secondary | ICD-10-CM

## 2016-03-09 DIAGNOSIS — J069 Acute upper respiratory infection, unspecified: Secondary | ICD-10-CM | POA: Diagnosis not present

## 2016-03-09 DIAGNOSIS — E119 Type 2 diabetes mellitus without complications: Secondary | ICD-10-CM

## 2016-03-09 DIAGNOSIS — Z760 Encounter for issue of repeat prescription: Secondary | ICD-10-CM

## 2016-03-09 DIAGNOSIS — N83201 Unspecified ovarian cyst, right side: Secondary | ICD-10-CM

## 2016-03-09 MED ORDER — CHLORPHENIRAMINE-DM 2-15 MG/5ML PO LIQD: ORAL | 0 refills | Status: DC

## 2016-03-09 MED ORDER — INSULIN NPH ISOPHANE & REGULAR (70-30) 100 UNIT/ML ~~LOC~~ SUSP: SUBCUTANEOUS | 0 refills | Status: DC

## 2016-03-09 MED ORDER — DICLOFENAC POTASSIUM 50 MG PO TABS: 50.0000 mg | ORAL_TABLET | Freq: Three times a day (TID) | ORAL | 0 refills | Status: DC

## 2016-03-09 NOTE — ED Provider Notes (Signed)
CSN: [id number]     Arrival date & time 03/09/16  1914 History   None    Chief Complaint  Patient presents with  . Medication Refill   (Consider location/radiation/quality/duration/timing/severity/associated sxs/prior Treatment) 45 year old obese female with type 2 diabetes mellitus and recently released from jail presents to the urgent care for the second time in the past month for refills of her chronic medications and for pain medicine of her right ovarian cyst. She is complaining of upper respiratory congestion, sore throat and stuffy nose. She would like a refill of her insulin. She states she has a right ovarian cyst but does not have pain medicine for it. She is not taking any medications because she does not have any. No fever or chills or shortness of breath.      Past Medical History:  Diagnosis Date  . Anxiety   . Depression   . Diabetes mellitus   . Hypertension    Past Surgical History:  Procedure Laterality Date  . BTL     No family history on file. Social History  Substance Use Topics  . Smoking status: Current Every Day Smoker    Types: Cigarettes  . Smokeless tobacco: Never Used  . Alcohol use No   OB History    Gravida Para Term Preterm AB Living   5       2 3    SAB TAB Ectopic Multiple Live Births   1 1           Review of Systems  Constitutional: Negative for activity change, appetite change, chills, fatigue and fever.  HENT: Positive for congestion, postnasal drip and rhinorrhea. Negative for facial swelling.   Eyes: Negative.   Respiratory: Positive for cough.   Cardiovascular: Negative.   Musculoskeletal: Negative for neck pain and neck stiffness.  Skin: Negative for pallor and rash.  Neurological: Negative.   All other systems reviewed and are negative.   Allergies  Patient has no known allergies.  Home Medications   Prior to Admission medications   Medication Sig Start Date End Date Taking? Authorizing Provider  insulin aspart  protamine- aspart (NOVOLOG MIX 70/30) (70-30) 100 UNIT/ML injection Inject into the skin.   Yes Historical Provider, MD  INSULIN SYRINGE .5CC/29G (SAFETY INSULIN SYRINGES) 29G X 1/2" 0.5 ML MISC 1 Syringe by Does not apply route 2 (two) times daily. 02/07/16  Yes Deatra CanterWilliam J Oxford, FNP  albuterol (PROVENTIL HFA;VENTOLIN HFA) 108 (90 BASE) MCG/ACT inhaler Inhale 2 puffs into the lungs 2 (two) times daily as needed. For shortness of breath.    Historical Provider, MD  beclomethasone (QVAR) 80 MCG/ACT inhaler Inhale 2 puffs into the lungs 2 (two) times daily.      Historical Provider, MD  Chlorpheniramine-DM 2-15 MG/5ML LIQD Take 1/2-1 teaspoon by mouth every 4 hours as needed for cough and drainage. May cause drowsiness. 03/09/16   Hayden Rasmussenavid Gedalia Mcmillon, NP  diclofenac (CATAFLAM) 50 MG tablet Take 1 tablet (50 mg total) by mouth 3 (three) times daily. One tablet TID with food prn pain. 03/09/16   Hayden Rasmussenavid Henny Strauch, NP  hydrOXYzine (ATARAX/VISTARIL) 25 MG tablet Take 25 mg by mouth 2 (two) times daily as needed. For anxiety.     Historical Provider, MD  insulin NPH-regular Human (NOVOLIN 70/30) (70-30) 100 UNIT/ML injection Inject 28 units into the skin bid before meals 03/09/16   Hayden Rasmussenavid Linsi Humann, NP  lisinopril (PRINIVIL,ZESTRIL) 5 MG tablet Take 5 mg by mouth daily.      Historical Provider, MD  metFORMIN (  GLUCOPHAGE) 500 MG tablet Take 500 mg by mouth 2 (two) times daily with a meal.      Historical Provider, MD  sertraline (ZOLOFT) 50 MG tablet Take 100 mg by mouth daily.      Historical Provider, MD   Meds Ordered and Administered this Visit  Medications - No data to display  BP (!) 108/47 (BP Location: Left Arm)   Pulse 94   Temp 98.9 F (37.2 C) (Oral)   Resp 22   LMP 02/13/2016   SpO2 98%  No data found.   Physical Exam  Constitutional: She is oriented to person, place, and time. She appears well-developed and well-nourished. No distress.  HENT:  Head: Normocephalic.  Right Ear: External ear normal.  Left  Ear: External ear normal.  Mouth/Throat: No oropharyngeal exudate.  Oropharynx with clear PND. Minimal erythema. No swelling. No exudates.  Bilateral TMs are normal.  Eyes: EOM are normal.  Neck: Normal range of motion. Neck supple.  Cardiovascular: Normal rate, regular rhythm and normal heart sounds.   No murmur heard. Pulmonary/Chest: Effort normal. No respiratory distress. She has no wheezes. She has no rales.  Musculoskeletal: Normal range of motion.  Lymphadenopathy:    She has no cervical adenopathy.  Neurological: She is alert and oriented to person, place, and time. No cranial nerve deficit.  Skin: Skin is warm and dry.  Psychiatric: She has a normal mood and affect.  Nursing note and vitals reviewed.   Urgent Care Course   Clinical Course     Procedures (including critical care time)  Labs Review Labs Reviewed - No data to display  Imaging Review No results found.   Visual Acuity Review  Right Eye Distance:   Left Eye Distance:   Bilateral Distance:    Right Eye Near:   Left Eye Near:    Bilateral Near:         MDM   1. Medication refill   2. Acute URI   3. Type 2 diabetes mellitus without complication, with long-term current use of insulin (HCC)   4. Cyst of right ovary    For nasal congestion take Sudafed PE 10 mg every 4 hours. Use saline nasal spray frequently. Drink plenty fluids and stay well-hydrated. Take the medication prescription for cough and drainage as directed. May cause drowsiness. Meds ordered this encounter  Medications  . Chlorpheniramine-DM 2-15 MG/5ML LIQD    Sig: Take 1/2-1 teaspoon by mouth every 4 hours as needed for cough and drainage. May cause drowsiness.    Dispense:  120 mL    Refill:  0    Order Specific Question:   Supervising Provider    Answer:   Charm RingsHONIG, ERIN J [id number]]  . diclofenac (CATAFLAM) 50 MG tablet    Sig: Take 1 tablet (50 mg total) by mouth 3 (three) times daily. One tablet TID with food prn pain.     Dispense:  21 tablet    Refill:  0    Order Specific Question:   Supervising Provider    Answer:   Charm RingsHONIG, ERIN J [id number]]  . insulin NPH-regular Human (NOVOLIN 70/30) (70-30) 100 UNIT/ML injection    Sig: Inject 28 units into the skin bid before meals    Dispense:  10 mL    Refill:  0    Order Specific Question:   Supervising Provider    Answer:   Micheline ChapmanHONIG, ERIN J [[id number]]       Hayden Rasmussenavid Samrat Hayward, NP 03/09/16 2105

## 2016-03-09 NOTE — ED Triage Notes (Signed)
Reports congestion, sore throat, congested for a week.    Patient requesting insulin medications.  Patient recently incarcerated and does not have a pcp

## 2016-03-09 NOTE — ED Notes (Signed)
Patient has 2 minor children being seen in the same treatment room, same provider

## 2016-03-09 NOTE — Discharge Instructions (Signed)
For nasal congestion take Sudafed PE 10 mg every 4 hours. Use saline nasal spray frequently. Drink plenty fluids and stay well-hydrated. Take the medication prescription for cough and drainage as directed. May cause drowsiness.

## 2016-04-19 ENCOUNTER — Ambulatory Visit (HOSPITAL_COMMUNITY)
Admission: EM | Admit: 2016-04-19 | Discharge: 2016-04-19 | Disposition: A | Discharge status: Home / Self Care | Payer: Medicaid Other | Attending: Internal Medicine | Admitting: Internal Medicine

## 2016-04-19 ENCOUNTER — Encounter (HOSPITAL_COMMUNITY): Payer: Self-pay | Admitting: Emergency Medicine

## 2016-04-19 DIAGNOSIS — R05 Cough: Secondary | ICD-10-CM | POA: Insufficient documentation

## 2016-04-19 DIAGNOSIS — Z794 Long term (current) use of insulin: Secondary | ICD-10-CM | POA: Diagnosis not present

## 2016-04-19 DIAGNOSIS — I10 Essential (primary) hypertension: Secondary | ICD-10-CM | POA: Diagnosis not present

## 2016-04-19 DIAGNOSIS — Z79899 Other long term (current) drug therapy: Secondary | ICD-10-CM | POA: Insufficient documentation

## 2016-04-19 DIAGNOSIS — F17210 Nicotine dependence, cigarettes, uncomplicated: Secondary | ICD-10-CM | POA: Diagnosis not present

## 2016-04-19 DIAGNOSIS — B9789 Other viral agents as the cause of diseases classified elsewhere: Secondary | ICD-10-CM

## 2016-04-19 DIAGNOSIS — F329 Major depressive disorder, single episode, unspecified: Secondary | ICD-10-CM | POA: Diagnosis not present

## 2016-04-19 DIAGNOSIS — J029 Acute pharyngitis, unspecified: Secondary | ICD-10-CM | POA: Diagnosis not present

## 2016-04-19 DIAGNOSIS — F419 Anxiety disorder, unspecified: Secondary | ICD-10-CM | POA: Diagnosis not present

## 2016-04-19 DIAGNOSIS — J069 Acute upper respiratory infection, unspecified: Secondary | ICD-10-CM | POA: Diagnosis not present

## 2016-04-19 DIAGNOSIS — E119 Type 2 diabetes mellitus without complications: Secondary | ICD-10-CM | POA: Diagnosis not present

## 2016-04-19 LAB — POCT RAPID STREP A: Streptococcus, Group A Screen (Direct): NEGATIVE

## 2016-04-19 MED ORDER — PREDNISONE 10 MG (21) PO TBPK: ORAL_TABLET | ORAL | 0 refills | Status: DC

## 2016-04-19 MED ORDER — ONDANSETRON 4 MG PO TBDP: 4.0000 mg | ORAL_TABLET | Freq: Three times a day (TID) | ORAL | 0 refills | Status: DC | PRN

## 2016-04-19 MED ORDER — GUAIFENESIN-CODEINE 100-10 MG/5ML PO SOLN: 5.0000 mL | Freq: Three times a day (TID) | ORAL | 0 refills | Status: DC | PRN

## 2016-04-19 NOTE — ED Triage Notes (Signed)
[facility] a [facility]. The patient reported that the cough is productive and she has seen some blood in the phlegm.  The patient also requested a refill on her insulin..Marland Kitchen

## 2016-04-19 NOTE — ED Provider Notes (Signed)
CSN: [id number]     Arrival date & time 04/19/16  1449 History   First MD Initiated Contact with Patient 04/19/16 1654     Chief Complaint  Patient presents with  . Cough  . Medication Refill   (Consider location/radiation/quality/duration/timing/severity/associated sxs/prior Treatment) 46 year old female presents with chief complaint of cough, congestion, body aches, sore throat and fever. She denies any nausea, vomiting, or diarrhea, as well as shortness of breath or wheezing. She has had symptoms for 4-5 days and has had no improvement with OTC medications. Cough is non-productive, dry, and hacking.   The history is provided by the patient.    Past Medical History:  Diagnosis Date  . Anxiety   . Depression   . Diabetes mellitus   . Hypertension    Past Surgical History:  Procedure Laterality Date  . BTL     History reviewed. No pertinent family history. Social History  Substance Use Topics  . Smoking status: Current Every Day Smoker    Types: Cigarettes  . Smokeless tobacco: Never Used  . Alcohol use No   OB History    Gravida Para Term Preterm AB Living   5       2 3    SAB TAB Ectopic Multiple Live Births   1 1           Review of Systems  Reason unable to perform ROS: As covered in HPI.  Eyes: Negative.   Respiratory: Positive for cough. Negative for shortness of breath and wheezing.   Cardiovascular: Negative.   Musculoskeletal: Negative.   Neurological: Negative.   All other systems reviewed and are negative.   Allergies  Patient has no known allergies.  Home Medications   Prior to Admission medications   Medication Sig Start Date End Date Taking? Authorizing Provider  insulin NPH-regular Human (NOVOLIN 70/30) (70-30) 100 UNIT/ML injection Inject 28 units into the skin bid before meals 03/09/16  Yes Hayden Rasmussen, NP  lisinopril (PRINIVIL,ZESTRIL) 5 MG tablet Take 5 mg by mouth daily.     Yes Historical Provider, MD  metFORMIN (GLUCOPHAGE) 500 MG tablet  Take 500 mg by mouth 2 (two) times daily with a meal.     Yes Historical Provider, MD  guaiFENesin-codeine 100-10 MG/5ML syrup Take 5 mLs by mouth 3 (three) times daily as needed for cough. 04/19/16   Dorena Bodo, NP  ondansetron (ZOFRAN ODT) 4 MG disintegrating tablet Take 1 tablet (4 mg total) by mouth every 8 (eight) hours as needed for nausea or vomiting. 04/19/16   Dorena Bodo, NP  predniSONE (STERAPRED UNI-PAK 21 TAB) 10 MG (21) TBPK tablet Take 6 tablets today, then reduce by 1 each day till finished (6,9,6,2,9,5) 04/19/16   Dorena Bodo, NP   Meds Ordered and Administered this Visit  Medications - No data to display  BP 141/81 (BP Location: Left Arm)   Pulse 96   Temp 98.5 F (36.9 C) (Oral)   Resp 20   SpO2 100%  No data found.   Physical Exam  Constitutional: She is oriented to person, place, and time. She appears well-developed. She appears ill. No distress.  HENT:  Head: Normocephalic.  Right Ear: Tympanic membrane and external ear normal.  Left Ear: Tympanic membrane and external ear normal.  Nose: Nose normal.  Mouth/Throat: Oropharynx is clear and moist and mucous membranes are normal. No oropharyngeal exudate, posterior oropharyngeal edema or posterior oropharyngeal erythema.  Eyes: Pupils are equal, round, and reactive to light.  Neck: Normal  range of motion. Neck supple. No JVD present.  Cardiovascular: Normal rate.   Pulmonary/Chest: Effort normal and breath sounds normal.  Abdominal: Soft. Bowel sounds are normal. She exhibits no distension.  Lymphadenopathy:    She has cervical adenopathy.  Neurological: She is alert and oriented to person, place, and time.  Skin: Skin is warm and dry. Capillary refill takes less than 2 seconds. She is not diaphoretic. No erythema. No pallor.  Psychiatric: She has a normal mood and affect.  Nursing note and vitals reviewed.   Urgent Care Course   Clinical Course     Procedures (including critical care  time)  Labs Review Labs Reviewed  POCT RAPID STREP A    Imaging Review No results found.   Visual Acuity Review  Right Eye Distance:   Left Eye Distance:   Bilateral Distance:    Right Eye Near:   Left Eye Near:    Bilateral Near:         MDM   1. Viral URI with cough   You have been given a cough medicine with guaifenesine and codeine. This medicine will caw drowsiness, do not drive or drink alcohol while taking this medicine. I have also prescribed a prednisone dose pack. This will help with the swelling and pain in your throat. I have also written you a prescription for Zofran for nausea.   Take tylenol every 4-6 hours for fever and for pain and antihistamines for congestion.  I have declined to refill your insulin.  Schedule an appointment with your primary care provider for management of your diabetes and follow up with them should your symptoms fail to resolve.      Dorena BodoLawrence Riah Kehoe, NP 04/19/16 1725

## 2016-04-19 NOTE — Discharge Instructions (Signed)
You have been given a cough medicine with guaifenesine and codeine. This medicine will caw drowsiness, do not drive or drink alcohol while taking this medicine. I have also prescribed a prednisone dose pack. This will help with the swelling and pain in your throat. I have also written you a prescription for Zofran for nausea.   Take tylenol every 4-6 hours for fever and for pain and antihistamines for congestion.  I have declined to refill your insulin.  Schedule an appointment with your primary care provider for management of your diabetes and follow up with them should your symptoms fail to resolve.

## 2016-04-21 LAB — CULTURE, GROUP A STREP (THRC)

## 2016-04-24 ENCOUNTER — Emergency Department (HOSPITAL_COMMUNITY)
Admission: EM | Admit: 2016-04-24 | Discharge: 2016-04-25 | Disposition: A | Discharge status: Home / Self Care | Payer: Medicaid Other | Attending: Emergency Medicine | Admitting: Emergency Medicine

## 2016-04-24 ENCOUNTER — Ambulatory Visit (HOSPITAL_COMMUNITY)
Admission: EM | Admit: 2016-04-24 | Discharge: 2016-04-24 | Disposition: A | Discharge status: Home / Self Care | Payer: Medicaid Other | Attending: Family Medicine | Admitting: Family Medicine

## 2016-04-24 ENCOUNTER — Encounter (HOSPITAL_COMMUNITY): Payer: Self-pay | Admitting: Emergency Medicine

## 2016-04-24 ENCOUNTER — Emergency Department (HOSPITAL_COMMUNITY): Payer: Medicaid Other

## 2016-04-24 DIAGNOSIS — J40 Bronchitis, not specified as acute or chronic: Secondary | ICD-10-CM | POA: Diagnosis not present

## 2016-04-24 DIAGNOSIS — F17210 Nicotine dependence, cigarettes, uncomplicated: Secondary | ICD-10-CM | POA: Insufficient documentation

## 2016-04-24 DIAGNOSIS — E1165 Type 2 diabetes mellitus with hyperglycemia: Secondary | ICD-10-CM | POA: Diagnosis present

## 2016-04-24 DIAGNOSIS — Z79899 Other long term (current) drug therapy: Secondary | ICD-10-CM | POA: Insufficient documentation

## 2016-04-24 DIAGNOSIS — E1065 Type 1 diabetes mellitus with hyperglycemia: Secondary | ICD-10-CM

## 2016-04-24 DIAGNOSIS — Z794 Long term (current) use of insulin: Secondary | ICD-10-CM | POA: Diagnosis not present

## 2016-04-24 DIAGNOSIS — I10 Essential (primary) hypertension: Secondary | ICD-10-CM | POA: Diagnosis not present

## 2016-04-24 DIAGNOSIS — R739 Hyperglycemia, unspecified: Secondary | ICD-10-CM

## 2016-04-24 LAB — URINALYSIS, ROUTINE W REFLEX MICROSCOPIC
BACTERIA UA: NONE SEEN
Bilirubin Urine: NEGATIVE
Glucose, UA: >=500 mg/dL — AB
Hgb urine dipstick: NEGATIVE
Ketones, ur: NEGATIVE mg/dL
Leukocytes, UA: NEGATIVE
Nitrite: NEGATIVE
Protein, ur: NEGATIVE mg/dL
SPECIFIC GRAVITY, URINE: 1.036 — AB (ref 1.005–1.030)
pH: 5 (ref 5.0–8.0)

## 2016-04-24 LAB — POCT I-STAT, CHEM 8
BUN: 14 mg/dL (ref 6–20)
CALCIUM ION: 1.2 mmol/L (ref 1.15–1.40)
CHLORIDE: 97 mmol/L — AB (ref 101–111)
Creatinine, Ser: 0.8 mg/dL (ref 0.44–1.00)
GLUCOSE: 486 mg/dL — AB (ref 65–99)
HCT: 46 % (ref 36.0–46.0)
Hemoglobin: 15.6 g/dL — ABNORMAL HIGH (ref 12.0–15.0)
Potassium: 3.9 mmol/L (ref 3.5–5.1)
SODIUM: 135 mmol/L (ref 135–145)
TCO2: 25 mmol/L (ref 0–100)

## 2016-04-24 LAB — BASIC METABOLIC PANEL
ANION GAP: 11 (ref 5–15)
BUN: 11 mg/dL (ref 6–20)
CO2: 25 mmol/L (ref 22–32)
Calcium: 9.6 mg/dL (ref 8.9–10.3)
Chloride: 95 mmol/L — ABNORMAL LOW (ref 101–111)
Creatinine, Ser: 0.84 mg/dL (ref 0.44–1.00)
GFR calc Af Amer: >60 mL/min (ref >60)
GFR calc non Af Amer: >60 mL/min (ref >60)
GLUCOSE: 479 mg/dL — AB (ref 65–99)
POTASSIUM: 3.8 mmol/L (ref 3.5–5.1)
Sodium: 131 mmol/L — ABNORMAL LOW (ref 135–145)

## 2016-04-24 LAB — CBG MONITORING, ED
GLUCOSE-CAPILLARY: 500 mg/dL — AB (ref 65–99)
GLUCOSE-CAPILLARY: 503 mg/dL — AB (ref 65–99)
GLUCOSE-CAPILLARY: 471 mg/dL — AB (ref 65–99)

## 2016-04-24 LAB — CBC
HEMATOCRIT: 37.3 % (ref 36.0–46.0)
HEMOGLOBIN: 12.8 g/dL (ref 12.0–15.0)
MCH: 24.4 pg — AB (ref 26.0–34.0)
MCHC: 34.3 g/dL (ref 30.0–36.0)
MCV: 71.2 fL — ABNORMAL LOW (ref 78.0–100.0)
Platelets: 335 10*3/uL (ref 150–400)
RBC: 5.24 MIL/uL — ABNORMAL HIGH (ref 3.87–5.11)
RDW: 14.8 % (ref 11.5–15.5)
WBC: 16.2 10*3/uL — ABNORMAL HIGH (ref 4.0–10.5)

## 2016-04-24 LAB — PREGNANCY, URINE: PREG TEST UR: NEGATIVE

## 2016-04-24 MED ORDER — INSULIN ASPART 100 UNIT/ML ~~LOC~~ SOLN
10.0000 [IU] | Freq: Once | SUBCUTANEOUS | Status: AC
  Administered 2016-04-24: 10 [IU] via INTRAVENOUS
  Filled 2016-04-24: qty 1

## 2016-04-24 MED ORDER — SODIUM CHLORIDE 0.9 % IV BOLUS (SEPSIS)
1000.0000 mL | Freq: Once | INTRAVENOUS | Status: AC
  Administered 2016-04-24: 1000 mL via INTRAVENOUS

## 2016-04-24 NOTE — ED Notes (Addendum)
Pt reports high sugar. Pt reports she is a type I diabetic and has run out of insulin. Pt reports she just got out of prison and cannot get her insulin or in to see a doctor til February. Pt reports going to UC but they could not do anything for her so they referred her to the ED. Pt reports she had a cough as well that she would like to have evaluated.

## 2016-04-24 NOTE — ED Triage Notes (Addendum)
Patient reports no insulin in 5 days.  Patient just arrived home from prison.  Patient states cbg is 500 and vision is blurry.  Says she has no money and requesting we give her insulin.   Patient also complaining about cough for 2 weeks and interrupting sleep

## 2016-04-24 NOTE — ED Triage Notes (Signed)
States her sugar is high , she ran out of insulin  X 5 days states she has been going to er to get meds, states has been taking metformin, mouth is dry and she feels bad and she has a bad cough

## 2016-04-24 NOTE — ED Provider Notes (Signed)
[facility]    CSN: [id number] Arrival date & time: 04/24/16  1457     History   Chief Complaint Chief Complaint  Patient presents with  . Medication Refill    HPI [name] is a 46 y.o. female.   The history is provided by the patient.  Medication Refill  Reason for request:  Medications not available Patient has complete original prescription information: no (pt reports no insulin for 1 wk, just out of prison)   Source of information:  Patient reported - no other verification available   Past Medical History:  Diagnosis Date  . Anxiety   . Depression   . Diabetes mellitus   . Hypertension     There are no active problems to display for this patient.   Past Surgical History:  Procedure Laterality Date  . BTL      OB History    Gravida Para Term Preterm AB Living   5       2 3    SAB TAB Ectopic Multiple Live Births   1 1             Home Medications    Prior to Admission medications   Medication Sig Start Date End Date Taking? Authorizing Provider  lisinopril (PRINIVIL,ZESTRIL) 5 MG tablet Take 5 mg by mouth daily.     Yes Historical Provider, MD  metFORMIN (GLUCOPHAGE) 500 MG tablet Take 500 mg by mouth 2 (two) times daily with a meal.     Yes Historical Provider, MD  guaiFENesin-codeine 100-10 MG/5ML syrup Take 5 mLs by mouth 3 (three) times daily as needed for cough. 04/19/16   Dorena BodoLawrence Kennard, NP  insulin NPH-regular Human (NOVOLIN 70/30) (70-30) 100 UNIT/ML injection Inject 28 units into the skin bid before meals 03/09/16   Hayden Rasmussenavid Mabe, NP  ondansetron (ZOFRAN ODT) 4 MG disintegrating tablet Take 1 tablet (4 mg total) by mouth every 8 (eight) hours as needed for nausea or vomiting. 04/19/16   Dorena BodoLawrence Kennard, NP  predniSONE (STERAPRED UNI-PAK 21 TAB) 10 MG (21) TBPK tablet Take 6 tablets today, then reduce by 1 each day till finished (4,0,9,8,1,1(6,5,4,3,2,1) Patient not taking: Reported on 04/24/2016 04/19/16   Dorena BodoLawrence Kennard, NP    Family  History No family history on file.  Social History Social History  Substance Use Topics  . Smoking status: Current Every Day Smoker    Types: Cigarettes  . Smokeless tobacco: Never Used  . Alcohol use No     Allergies   Patient has no known allergies.   Review of Systems Review of Systems  Constitutional: Negative.   Eyes: Positive for visual disturbance.  Neurological: Positive for numbness.  All other systems reviewed and are negative.    Physical Exam Triage Vital Signs ED Triage Vitals  Enc Vitals Group     BP --      Pulse Rate 04/24/16 1718 84     Resp 04/24/16 1718 20     Temp 04/24/16 1718 97.5 F (36.4 C)     Temp Source 04/24/16 1718 Oral     SpO2 04/24/16 1718 97 %     Weight --      Height --      Head Circumference --      Peak Flow --      Pain Score 04/24/16 1717 9     Pain Loc --      Pain Edu? --      Excl. in GC? --  No data found.   Updated Vital Signs Pulse 84   Temp 97.5 F (36.4 C) (Oral)   Resp 20   SpO2 97%   Visual Acuity Right Eye Distance:   Left Eye Distance:   Bilateral Distance:    Right Eye Near:   Left Eye Near:    Bilateral Near:     Physical Exam  Constitutional: She is oriented to person, place, and time. She appears well-developed and well-nourished. She appears distressed.  Cardiovascular: Normal rate.   Lymphadenopathy:    She has no cervical adenopathy.  Neurological: She is alert and oriented to person, place, and time.  Skin: Skin is warm and dry.  Nursing note and vitals reviewed.    UC Treatments / Results  Labs (all labs ordered are listed, but only abnormal results are displayed) Labs Reviewed  POCT I-STAT, CHEM 8 - Abnormal; Notable for the following:       Result Value   Chloride 97 (*)    Glucose, Bld 486 (*)    Hemoglobin 15.6 (*)    All other components within normal limits    EKG  EKG Interpretation None       Radiology No results found.  Procedures Procedures  (including critical care time)  Medications Ordered in UC Medications - No data to display   Initial Impression / Assessment and Plan / UC Course  I have reviewed the triage vital signs and the nursing notes.  Pertinent labs & imaging results that were available during my care of the patient were reviewed by me and considered in my medical decision making (see chart for details).  Clinical Course     Sent for diabetic sx and sugar486 out of insulin for 1 week..  Final Clinical Impressions(s) / UC Diagnoses   Final diagnoses:  Hyperglycemia due to type 1 diabetes mellitus [facility])    New Prescriptions New Prescriptions   No medications on file     Linna Hoff, MD 04/24/16 1755

## 2016-04-24 NOTE — ED Provider Notes (Signed)
[facility] Provider Note   CSN: [id number] Arrival date & time: 04/24/16  1800     History   Chief Complaint Chief Complaint  Patient presents with  . Hyperglycemia    HPI [name] is a 46 y.o. female.  HPI [name] is a 46 y.o. female with PMH significant for anxiety, depression, DM, and HTN who presents with for evaluation of hyperglycemia.  She states she ran out of her insulin 5 days ago, she takes Novolin 70/30 28 U BID. She also takes metformin, but has not run out.  She denies fever, chills, N/V/D, or abdominal pain.  She complains of a 2 week history of productive cough (phlegm with occasional flecks of blood) with associated wheezing and shortness of breath.  No CP.  No recent immobilization, surgery, or long travel.  No hx of DVT/PE. She has taken Robitussin and prednisone for her cough. No unilateral leg swelling. She denies current tobacco use. 5 pack year history.  Past Medical History:  Diagnosis Date  . Anxiety   . Depression   . Diabetes mellitus   . Hypertension     There are no active problems to display for this patient.   Past Surgical History:  Procedure Laterality Date  . BTL      OB History    Gravida Para Term Preterm AB Living   5       2 3    SAB TAB Ectopic Multiple Live Births   1 1             Home Medications    Prior to Admission medications   Medication Sig Start Date End Date Taking? Authorizing Provider  guaiFENesin-codeine 100-10 MG/5ML syrup Take 5 mLs by mouth 3 (three) times daily as needed for cough. 04/19/16  Yes Dorena BodoLawrence Kennard, NP  insulin NPH-regular Human (NOVOLIN 70/30) (70-30) 100 UNIT/ML injection Inject 28 units into the skin bid before meals 03/09/16  Yes Hayden Rasmussenavid Mabe, NP  lisinopril (PRINIVIL,ZESTRIL) 5 MG tablet Take 5 mg by mouth daily.     Yes Historical Provider, MD  metFORMIN (GLUCOPHAGE) 500 MG tablet Take 500 mg by mouth 2 (two) times daily with a meal.     Yes Historical Provider, MD    ondansetron (ZOFRAN ODT) 4 MG disintegrating tablet Take 1 tablet (4 mg total) by mouth every 8 (eight) hours as needed for nausea or vomiting. 04/19/16  Yes Dorena BodoLawrence Kennard, NP  OVER THE COUNTER MEDICATION Inhale 1 application into the lungs at bedtime. CPAP   Yes Historical Provider, MD  predniSONE (STERAPRED UNI-PAK 21 TAB) 10 MG (21) TBPK tablet Take 6 tablets today, then reduce by 1 each day till finished (6,5,4,3,2,1) 04/19/16  Yes Dorena BodoLawrence Kennard, NP  azithromycin (ZITHROMAX) 250 MG tablet Take 1 tablet (250 mg total) by mouth daily. Take first 2 tablets together, then 1 every day until finished. 04/25/16   Cheri FowlerKayla Osiris Charles, PA-C  benzonatate (TESSALON) 100 MG capsule Take 1 capsule (100 mg total) by mouth 3 (three) times daily as needed for cough. 04/25/16   Cheri FowlerKayla Amandalee Lacap, PA-C  insulin NPH-regular Human (NOVOLIN 70/30) (70-30) 100 UNIT/ML injection Inject 28 Units into the skin 2 (two) times daily at 10 AM and 5 PM. 04/25/16   Cheri FowlerKayla Kylene Zamarron, PA-C    Family History No family history on file.  Social History Social History  Substance Use Topics  . Smoking status: Current Every Day Smoker    Types: Cigarettes  . Smokeless tobacco: Never Used  . Alcohol  use No     Allergies   Patient has no known allergies.   Review of Systems Review of Systems All other systems negative unless otherwise stated in HPI   Physical Exam Updated Vital Signs BP 136/55   Pulse 80   Temp 98.5 F (36.9 C) (Oral)   Resp 19   SpO2 96%   Physical Exam  Constitutional: She is oriented to person, place, and time. She appears well-developed and well-nourished.  Non-toxic appearance. She does not have a sickly appearance. She does not appear ill.  HENT:  Head: Normocephalic and atraumatic.  Mouth/Throat: Oropharynx is clear and moist.  Eyes: Conjunctivae are normal. Pupils are equal, round, and reactive to light.  Neck: Normal range of motion. Neck supple.  Cardiovascular: Normal rate and regular rhythm.    Pulmonary/Chest: Effort normal and breath sounds normal. No accessory muscle usage or stridor. No respiratory distress. She has no wheezes. She has no rhonchi. She has no rales.  Abdominal: Soft. Bowel sounds are normal. She exhibits no distension. There is no tenderness.  Musculoskeletal: Normal range of motion.  Lymphadenopathy:    She has no cervical adenopathy.  Neurological: She is alert and oriented to person, place, and time.  Speech clear without dysarthria.  Skin: Skin is warm and dry.  Psychiatric: She has a normal mood and affect. Her behavior is normal.     ED Treatments / Results  Labs (all labs ordered are listed, but only abnormal results are displayed) Labs Reviewed  BASIC METABOLIC PANEL - Abnormal; Notable for the following:       Result Value   Sodium 131 (*)    Chloride 95 (*)    Glucose, Bld 479 (*)    All other components within normal limits  CBC - Abnormal; Notable for the following:    WBC 16.2 (*)    RBC 5.24 (*)    MCV 71.2 (*)    MCH 24.4 (*)    All other components within normal limits  URINALYSIS, ROUTINE W REFLEX MICROSCOPIC - Abnormal; Notable for the following:    APPearance HAZY (*)    Specific Gravity, Urine 1.036 (*)    Glucose, UA >=500 (*)    Squamous Epithelial / LPF 0-5 (*)    All other components within normal limits  CBG MONITORING, ED - Abnormal; Notable for the following:    Glucose-Capillary 500 (*)    All other components within normal limits  CBG MONITORING, ED - Abnormal; Notable for the following:    Glucose-Capillary 503 (*)    All other components within normal limits  CBG MONITORING, ED - Abnormal; Notable for the following:    Glucose-Capillary 471 (*)    All other components within normal limits  CBG MONITORING, ED - Abnormal; Notable for the following:    Glucose-Capillary 350 (*)    All other components within normal limits  PREGNANCY, URINE    EKG  EKG Interpretation None       Radiology Dg Chest 2  View  Result Date: 04/24/2016 CLINICAL DATA:  Cough for 2-3 weeks. EXAM: CHEST  2 VIEW COMPARISON:  Chest x-ray dated 03/27/2011. FINDINGS: Cardiomediastinal silhouette is normal in size and configuration. Lungs are clear. Lung volumes are normal. No evidence of pneumonia. No pleural effusion. No pneumothorax. Osseous and soft tissue structures about the chest are unremarkable. IMPRESSION: No active cardiopulmonary disease.  No evidence of pneumonia. Electronically Signed   By: Bary Richard M.D.   On: 04/24/2016 21:36  Procedures Procedures (including critical care time)  Medications Ordered in ED Medications  sodium chloride 0.9 % bolus 1,000 mL (0 mLs Intravenous Stopped 04/24/16 2328)  insulin aspart (novoLOG) injection 10 Units (10 Units Intravenous Given 04/24/16 2112)  insulin aspart (novoLOG) injection 10 Units (10 Units Intravenous Given 04/24/16 2227)  sodium chloride 0.9 % bolus 1,000 mL (0 mLs Intravenous Stopped 04/25/16 0100)     Initial Impression / Assessment and Plan / ED Course  I have reviewed the triage vital signs and the nursing notes.  Pertinent labs & imaging results that were available during my care of the patient were reviewed by me and considered in my medical decision making (see chart for details).  Clinical Course    Patient presents with hyperglycemia and cough.  Vitals stable.  She appears well, non-toxic or ill. Heart sounds normal, lungs clear to auscultation bilaterally, abdomen soft and benign. Glucose 479. She does not appear to be in DKA. She was treated with IV fluids and 10 units of insulin. Chest x-ray clear. Low risk well's criteria for PE, PERC negative. This is likely bronchitis.  Glucose improved to 350 after 2L IVF and 20 U of insulin.  Increased insulin requirement due to the fact that the patient demanded to eat while in the ED.  Refill provided.  Discussed PCP follow up and discussed dangers of uncontrolled diabetes including vision loss,  cardiac disease, and kidney disease.  Return precautions discussed.  Stable for discharge.   Final Clinical Impressions(s) / ED Diagnoses   Final diagnoses:  Hyperglycemia  Bronchitis    New Prescriptions New Prescriptions   AZITHROMYCIN (ZITHROMAX) 250 MG TABLET    Take 1 tablet (250 mg total) by mouth daily. Take first 2 tablets together, then 1 every day until finished.   BENZONATATE (TESSALON) 100 MG CAPSULE    Take 1 capsule (100 mg total) by mouth 3 (three) times daily as needed for cough.   INSULIN NPH-REGULAR HUMAN (NOVOLIN 70/30) (70-30) 100 UNIT/ML INJECTION    Inject 28 Units into the skin 2 (two) times daily at 10 AM and 5 PM.     Cheri Fowler, PA-C 04/25/16 1610    Shaune Pollack, MD 04/26/16 1254

## 2016-04-24 NOTE — Discharge Instructions (Signed)
Go to ER for further sugar care.

## 2016-04-24 NOTE — ED Notes (Signed)
POCT CBG resulted 500; Cyndi, RN present

## 2016-04-25 LAB — CBG MONITORING, ED: Glucose-Capillary: 350 mg/dL — ABNORMAL HIGH (ref 65–99)

## 2016-04-25 MED ORDER — BENZONATATE 100 MG PO CAPS: 100.0000 mg | ORAL_CAPSULE | Freq: Three times a day (TID) | ORAL | 0 refills | Status: DC | PRN

## 2016-04-25 MED ORDER — AZITHROMYCIN 250 MG PO TABS: 250.0000 mg | ORAL_TABLET | Freq: Every day | ORAL | 0 refills | Status: DC

## 2016-04-25 MED ORDER — INSULIN NPH ISOPHANE & REGULAR (70-30) 100 UNIT/ML ~~LOC~~ SUSP: 28.0000 [IU] | Freq: Two times a day (BID) | SUBCUTANEOUS | 0 refills | Status: DC

## 2016-04-25 NOTE — Discharge Instructions (Signed)
We have refilled your insulin prescription today. It is extremely important that you follow up with her primary care physician. If you do not have one, please follow-up with the [facility] as they can provide further prescriptions. Uncontrolled diabetes can result in vision loss, heart problems, and kidney failure.  Your chest x-ray today is normal. Please take Tessalon Perles for your cough. Please start taking azithromycin as well. Return to the emergency department for any new or concerning symptoms.

## 2016-05-29 ENCOUNTER — Emergency Department (HOSPITAL_COMMUNITY)
Admission: EM | Admit: 2016-05-29 | Discharge: 2016-05-29 | Disposition: A | Discharge status: Home / Self Care | Payer: Medicaid Other | Attending: Emergency Medicine | Admitting: Emergency Medicine

## 2016-05-29 ENCOUNTER — Encounter (HOSPITAL_COMMUNITY): Payer: Self-pay | Admitting: Emergency Medicine

## 2016-05-29 DIAGNOSIS — R1010 Upper abdominal pain, unspecified: Secondary | ICD-10-CM | POA: Diagnosis not present

## 2016-05-29 DIAGNOSIS — R109 Unspecified abdominal pain: Secondary | ICD-10-CM

## 2016-05-29 DIAGNOSIS — I10 Essential (primary) hypertension: Secondary | ICD-10-CM | POA: Insufficient documentation

## 2016-05-29 DIAGNOSIS — Z794 Long term (current) use of insulin: Secondary | ICD-10-CM | POA: Insufficient documentation

## 2016-05-29 DIAGNOSIS — F17210 Nicotine dependence, cigarettes, uncomplicated: Secondary | ICD-10-CM | POA: Insufficient documentation

## 2016-05-29 DIAGNOSIS — R739 Hyperglycemia, unspecified: Secondary | ICD-10-CM

## 2016-05-29 DIAGNOSIS — Z79899 Other long term (current) drug therapy: Secondary | ICD-10-CM | POA: Diagnosis not present

## 2016-05-29 DIAGNOSIS — E1165 Type 2 diabetes mellitus with hyperglycemia: Secondary | ICD-10-CM | POA: Insufficient documentation

## 2016-05-29 LAB — CBC WITH DIFFERENTIAL/PLATELET
Basophils Absolute: 0 10*3/uL (ref 0.0–0.1)
Basophils Relative: 0 %
Eosinophils Absolute: 0 10*3/uL (ref 0.0–0.7)
Eosinophils Relative: 0 %
HEMATOCRIT: 45.7 % (ref 36.0–46.0)
Hemoglobin: 16.1 g/dL — ABNORMAL HIGH (ref 12.0–15.0)
LYMPHS ABS: 0.9 10*3/uL (ref 0.7–4.0)
LYMPHS PCT: 8 %
MCH: 25 pg — AB (ref 26.0–34.0)
MCHC: 35.2 g/dL (ref 30.0–36.0)
MCV: 71 fL — AB (ref 78.0–100.0)
MONO ABS: 0.3 10*3/uL (ref 0.1–1.0)
MONOS PCT: 2 %
NEUTROS ABS: 10.8 10*3/uL — AB (ref 1.7–7.7)
Neutrophils Relative %: 90 %
Platelets: 272 10*3/uL (ref 150–400)
RBC: 6.44 MIL/uL — ABNORMAL HIGH (ref 3.87–5.11)
RDW: 15.6 % — AB (ref 11.5–15.5)
WBC: 12 10*3/uL — ABNORMAL HIGH (ref 4.0–10.5)

## 2016-05-29 LAB — CBG MONITORING, ED
GLUCOSE-CAPILLARY: 360 mg/dL — AB (ref 65–99)
Glucose-Capillary: 287 mg/dL — ABNORMAL HIGH (ref 65–99)

## 2016-05-29 LAB — I-STAT VENOUS BLOOD GAS, ED
ACID-BASE DEFICIT: 5 mmol/L — AB (ref 0.0–2.0)
Bicarbonate: 20.1 mmol/L (ref 20.0–28.0)
O2 Saturation: 71 %
PCO2 VEN: 37.3 mmHg — AB (ref 44.0–60.0)
PH VEN: 7.341 (ref 7.250–7.430)
PO2 VEN: 39 mmHg (ref 32.0–45.0)
TCO2: 21 mmol/L (ref 0–100)

## 2016-05-29 LAB — LIPASE, BLOOD: Lipase: 27 U/L (ref 11–51)

## 2016-05-29 LAB — COMPREHENSIVE METABOLIC PANEL
ALK PHOS: 138 U/L — AB (ref 38–126)
ALT: 55 U/L — ABNORMAL HIGH (ref 14–54)
ANION GAP: 12 (ref 5–15)
AST: 40 U/L (ref 15–41)
Albumin: 3.6 g/dL (ref 3.5–5.0)
BILIRUBIN TOTAL: 0.9 mg/dL (ref 0.3–1.2)
BUN: 9 mg/dL (ref 6–20)
CALCIUM: 9.1 mg/dL (ref 8.9–10.3)
CO2: 19 mmol/L — ABNORMAL LOW (ref 22–32)
Chloride: 103 mmol/L (ref 101–111)
Creatinine, Ser: 0.79 mg/dL (ref 0.44–1.00)
GFR calc Af Amer: >60 mL/min (ref >60)
Glucose, Bld: 353 mg/dL — ABNORMAL HIGH (ref 65–99)
POTASSIUM: 4.2 mmol/L (ref 3.5–5.1)
Sodium: 134 mmol/L — ABNORMAL LOW (ref 135–145)
TOTAL PROTEIN: 8 g/dL (ref 6.5–8.1)

## 2016-05-29 MED ORDER — METFORMIN HCL 500 MG PO TABS: 1000.0000 mg | ORAL_TABLET | Freq: Two times a day (BID) | ORAL | 1 refills | Status: DC

## 2016-05-29 MED ORDER — SODIUM CHLORIDE 0.9 % IV SOLN: INTRAVENOUS | Status: DC

## 2016-05-29 MED ORDER — ONDANSETRON 8 MG PO TBDP: 8.0000 mg | ORAL_TABLET | Freq: Three times a day (TID) | ORAL | 0 refills | Status: DC | PRN

## 2016-05-29 MED ORDER — INSULIN ASPART 100 UNIT/ML ~~LOC~~ SOLN
8.0000 [IU] | Freq: Once | SUBCUTANEOUS | Status: AC
  Administered 2016-05-29: 8 [IU] via INTRAVENOUS
  Filled 2016-05-29: qty 1

## 2016-05-29 MED ORDER — INSULIN NPH ISOPHANE & REGULAR (70-30) 100 UNIT/ML ~~LOC~~ SUSP: 28.0000 [IU] | Freq: Two times a day (BID) | SUBCUTANEOUS | 1 refills | Status: DC

## 2016-05-29 MED ORDER — SODIUM CHLORIDE 0.9 % IV SOLN
999.0000 mL/h | INTRAVENOUS | Status: DC
  Administered 2016-05-29: 999 mL/h via INTRAVENOUS

## 2016-05-29 MED ORDER — ACETAMINOPHEN 325 MG PO TABS
650.0000 mg | ORAL_TABLET | Freq: Once | ORAL | Status: AC
  Administered 2016-05-29: 650 mg via ORAL
  Filled 2016-05-29: qty 2

## 2016-05-29 MED ORDER — SODIUM CHLORIDE 0.9 % IV BOLUS (SEPSIS)
1000.0000 mL | Freq: Once | INTRAVENOUS | Status: AC
  Administered 2016-05-29: 1000 mL via INTRAVENOUS

## 2016-05-29 NOTE — ED Notes (Signed)
Introduced self in lobby.  Offered warm blankets and explanations for delays in rooming.   

## 2016-05-29 NOTE — Discharge Instructions (Signed)
Make sure to follow up with a primary care doctor. Take your  diabetes medications as prescribed

## 2016-05-29 NOTE — ED Provider Notes (Signed)
[facility]-EMERGENCY DEPT Provider Note   CSN: [id number] Arrival date & time: 05/29/16  1744     History   Chief Complaint Chief Complaint  Patient presents with  . Hyperglycemia    HPI [name] is a 46 y.o. female.  HPI Patient presents to the emergency room for evaluation of hyperglycemia. Patient has history of diabetes. She is supposed to be on insulin. She does not have a primary doctor because she recently got out of jail.  The patient complains of upper abdominal pain. She did complains of nausea without vomiting. Her blood sugars have been elevated. Patient denies any fevers. No diarrhea. Past Medical History:  Diagnosis Date  . Anxiety   . Depression   . Diabetes mellitus   . Hypertension     There are no active problems to display for this patient.   Past Surgical History:  Procedure Laterality Date  . BTL      OB History    Gravida Para Term Preterm AB Living   5       2 3    SAB TAB Ectopic Multiple Live Births   1 1             Home Medications    Prior to Admission medications   Medication Sig Start Date End Date Taking? Authorizing Provider  albuterol (PROVENTIL HFA;VENTOLIN HFA) 108 (90 Base) MCG/ACT inhaler Inhale 1-2 puffs into the lungs every 6 (six) hours as needed for wheezing or shortness of breath.   Yes Historical Provider, MD  beclomethasone (QVAR) 40 MCG/ACT inhaler Inhale 2 puffs into the lungs 2 (two) times daily.   Yes Historical Provider, MD  benzonatate (TESSALON) 100 MG capsule Take 1 capsule (100 mg total) by mouth 3 (three) times daily as needed for cough. 04/25/16  Yes Kayla Rose, PA-C  guaiFENesin-codeine 100-10 MG/5ML syrup Take 5 mLs by mouth 3 (three) times daily as needed for cough. 04/19/16  Yes Dorena BodoLawrence Kennard, NP  lisinopril (PRINIVIL,ZESTRIL) 5 MG tablet Take 5 mg by mouth daily.     Yes Historical Provider, MD  OVER THE COUNTER MEDICATION Inhale 1 application into the lungs at bedtime. CPAP   Yes Historical Provider, MD   insulin NPH-regular Human (NOVOLIN 70/30) (70-30) 100 UNIT/ML injection Inject 28 Units into the skin 2 (two) times daily at 10 AM and 5 PM. 05/29/16   Linwood DibblesJon Yvette Loveless, MD  metFORMIN (GLUCOPHAGE) 500 MG tablet Take 2 tablets (1,000 mg total) by mouth 2 (two) times daily with a meal. 05/29/16   Linwood DibblesJon Abygail Galeno, MD  ondansetron (ZOFRAN ODT) 8 MG disintegrating tablet Take 1 tablet (8 mg total) by mouth every 8 (eight) hours as needed for nausea or vomiting. 05/29/16   Linwood DibblesJon Cutter Passey, MD    Family History No family history on file.  Social History Social History  Substance Use Topics  . Smoking status: Current Every Day Smoker    Types: Cigarettes  . Smokeless tobacco: Never Used  . Alcohol use No     Allergies   Patient has no known allergies.   Review of Systems Review of Systems  All other systems reviewed and are negative.    Physical Exam Updated Vital Signs BP 126/95 (BP Location: Right Arm)   Pulse 109   Temp 98.9 F (37.2 C) (Oral)   Resp 18   Ht 5' 5.5" (1.664 m)   Wt 106.6 kg   LMP 05/11/2016   SpO2 100%   BMI 38.51 kg/m   Physical Exam  Constitutional:  She appears well-developed and well-nourished. No distress.  HENT:  Head: Normocephalic and atraumatic.  Right Ear: External ear normal.  Left Ear: External ear normal.  Eyes: Conjunctivae are normal. Right eye exhibits no discharge. Left eye exhibits no discharge. No scleral icterus.  Neck: Neck supple. No tracheal deviation present.  Cardiovascular: Normal rate, regular rhythm and intact distal pulses.   Pulmonary/Chest: Effort normal and breath sounds normal. No stridor. No respiratory distress. She has no wheezes. She has no rales.  Abdominal: Soft. Bowel sounds are normal. She exhibits no distension. There is tenderness (Mild in the upper abdomen). There is no rebound and no guarding.  Musculoskeletal: She exhibits no edema or tenderness.  Neurological: She is alert. She has normal strength. No cranial nerve deficit (no  facial droop, extraocular movements intact, no slurred speech) or sensory deficit. She exhibits normal muscle tone. She displays no seizure activity. Coordination normal.  Skin: Skin is warm and dry. No rash noted.  Psychiatric: She has a normal mood and affect.  Nursing note and vitals reviewed.    ED Treatments / Results  Labs (all labs ordered are listed, but only abnormal results are displayed) Labs Reviewed  CBC WITH DIFFERENTIAL/PLATELET - Abnormal; Notable for the following:       Result Value   WBC 12.0 (*)    RBC 6.44 (*)    Hemoglobin 16.1 (*)    MCV 71.0 (*)    MCH 25.0 (*)    RDW 15.6 (*)    Neutro Abs 10.8 (*)    All other components within normal limits  COMPREHENSIVE METABOLIC PANEL - Abnormal; Notable for the following:    Sodium 134 (*)    CO2 19 (*)    Glucose, Bld 353 (*)    ALT 55 (*)    Alkaline Phosphatase 138 (*)    All other components within normal limits  CBG MONITORING, ED - Abnormal; Notable for the following:    Glucose-Capillary 360 (*)    All other components within normal limits  CBG MONITORING, ED - Abnormal; Notable for the following:    Glucose-Capillary 287 (*)    All other components within normal limits  I-STAT VENOUS BLOOD GAS, ED - Abnormal; Notable for the following:    pCO2, Ven 37.3 (*)    Acid-base deficit 5.0 (*)    All other components within normal limits  LIPASE, BLOOD  URINALYSIS, ROUTINE W REFLEX MICROSCOPIC    EKG  EKG Interpretation None       Radiology No results found.  Procedures Procedures (including critical care time)  Medications Ordered in ED Medications  0.9 %  sodium chloride infusion (999 mL/hr Intravenous New Bag/Given 05/29/16 2110)  sodium chloride 0.9 % bolus 1,000 mL (1,000 mLs Intravenous New Bag/Given 05/29/16 2110)  acetaminophen (TYLENOL) tablet 650 mg (650 mg Oral Given 05/29/16 2116)  insulin aspart (novoLOG) injection 8 Units (8 Units Intravenous Given 05/29/16 2117)     Initial  Impression / Assessment and Plan / ED Course  I have reviewed the triage vital signs and the nursing notes.  Pertinent labs & imaging results that were available during my care of the patient were reviewed by me and considered in my medical decision making (see chart for details).  Clinical Course as of May 29 2234  Mon May 29, 2016  2057 Patient has been seen previously in the ED for hyperglycemia. Initial laboratory tests show slightly decreased bicarbonate level. I will send off a VBG. I will give  her dose of insulin and a fluid bolus.  [JK]  2235 Patient has not provided a urine specimen. It hAs been several hours now. She is tolerating oral fluids.  [JK]    Clinical Course User Index [JK] Linwood Dibbles, MD   Patient's laboratory tests are reassuring. No evidence of diabetic ketoacidosis. She was given IV fluids and a dose of insulin. Her blood sugar improved.  I ordered a urinalysis initially but the patient was not able to provide a specimen. She denies any dysuria, urinary frequency or other urinary symptoms. She is ready to go home.  Discussed the importance of taking her diabetes medications and following up with a PCP  Final Clinical Impressions(s) / ED Diagnoses   Final diagnoses:  Hyperglycemia  Abdominal pain, unspecified abdominal location    New Prescriptions New Prescriptions   ONDANSETRON (ZOFRAN ODT) 8 MG DISINTEGRATING TABLET    Take 1 tablet (8 mg total) by mouth every 8 (eight) hours as needed for nausea or vomiting.     Linwood Dibbles, MD 05/29/16 2236

## 2016-05-29 NOTE — ED Notes (Signed)
CBG 287. Rn notified

## 2016-05-29 NOTE — ED Triage Notes (Addendum)
Pt st's she is diabetic and her blood sugar has been high.  St's she hasn't felt good in a few days.  Pt c/o nausea, dry mouth and feeling tired  St's CBG at home was 600.  Pt also st's she has been out of her insulin for 3 days.  Pt also c/o upper abd pain

## 2016-08-17 ENCOUNTER — Encounter (INDEPENDENT_AMBULATORY_CARE_PROVIDER_SITE_OTHER): Payer: Self-pay | Admitting: Physician Assistant

## 2016-08-17 ENCOUNTER — Ambulatory Visit (INDEPENDENT_AMBULATORY_CARE_PROVIDER_SITE_OTHER): Payer: Medicaid Other | Admitting: Physician Assistant

## 2016-08-17 ENCOUNTER — Telehealth (INDEPENDENT_AMBULATORY_CARE_PROVIDER_SITE_OTHER): Payer: Self-pay | Admitting: Physician Assistant

## 2016-08-17 VITALS — BP 136/88 | HR 77 | Temp 98.1°F | Ht 65.5 in | Wt 257.0 lb

## 2016-08-17 DIAGNOSIS — J301 Allergic rhinitis due to pollen: Secondary | ICD-10-CM

## 2016-08-17 DIAGNOSIS — J45909 Unspecified asthma, uncomplicated: Secondary | ICD-10-CM

## 2016-08-17 DIAGNOSIS — E119 Type 2 diabetes mellitus without complications: Secondary | ICD-10-CM | POA: Insufficient documentation

## 2016-08-17 DIAGNOSIS — E785 Hyperlipidemia, unspecified: Secondary | ICD-10-CM

## 2016-08-17 DIAGNOSIS — Z760 Encounter for issue of repeat prescription: Secondary | ICD-10-CM | POA: Diagnosis not present

## 2016-08-17 DIAGNOSIS — R05 Cough: Secondary | ICD-10-CM

## 2016-08-17 DIAGNOSIS — R739 Hyperglycemia, unspecified: Secondary | ICD-10-CM

## 2016-08-17 DIAGNOSIS — Z794 Long term (current) use of insulin: Secondary | ICD-10-CM | POA: Diagnosis not present

## 2016-08-17 DIAGNOSIS — R059 Cough, unspecified: Secondary | ICD-10-CM

## 2016-08-17 DIAGNOSIS — F329 Major depressive disorder, single episode, unspecified: Secondary | ICD-10-CM

## 2016-08-17 DIAGNOSIS — G43811 Other migraine, intractable, with status migrainosus: Secondary | ICD-10-CM | POA: Diagnosis not present

## 2016-08-17 DIAGNOSIS — E039 Hypothyroidism, unspecified: Secondary | ICD-10-CM | POA: Diagnosis not present

## 2016-08-17 DIAGNOSIS — I10 Essential (primary) hypertension: Secondary | ICD-10-CM

## 2016-08-17 DIAGNOSIS — F32A Depression, unspecified: Secondary | ICD-10-CM

## 2016-08-17 LAB — POCT CBG (FASTING - GLUCOSE)-MANUAL ENTRY: Glucose Fasting, POC: 270 mg/dL — AB (ref 70–99)

## 2016-08-17 LAB — POCT GLYCOSYLATED HEMOGLOBIN (HGB A1C): HEMOGLOBIN A1C: 11.1

## 2016-08-17 MED ORDER — PRAVASTATIN SODIUM 10 MG PO TABS: 10.0000 mg | ORAL_TABLET | Freq: Every day | ORAL | 1 refills | Status: DC

## 2016-08-17 MED ORDER — HYDROCHLOROTHIAZIDE 25 MG PO TABS: 25.0000 mg | ORAL_TABLET | Freq: Every day | ORAL | 1 refills | Status: DC

## 2016-08-17 MED ORDER — ACCU-CHEK SOFT TOUCH LANCETS MISC: 12 refills | Status: DC

## 2016-08-17 MED ORDER — SERTRALINE HCL 100 MG PO TABS: 100.0000 mg | ORAL_TABLET | Freq: Every day | ORAL | 3 refills | Status: DC

## 2016-08-17 MED ORDER — CETIRIZINE HCL 10 MG PO TABS: 10.0000 mg | ORAL_TABLET | Freq: Every day | ORAL | 2 refills | Status: DC

## 2016-08-17 MED ORDER — OMEPRAZOLE 20 MG PO CPDR: 20.0000 mg | DELAYED_RELEASE_CAPSULE | Freq: Every day | ORAL | 2 refills | Status: DC

## 2016-08-17 MED ORDER — INSULIN DETEMIR 100 UNIT/ML ~~LOC~~ SOLN: 50.0000 [IU] | Freq: Every day | SUBCUTANEOUS | 11 refills | Status: DC

## 2016-08-17 MED ORDER — SUMATRIPTAN SUCCINATE 25 MG PO TABS: 25.0000 mg | ORAL_TABLET | Freq: Every day | ORAL | 0 refills | Status: DC

## 2016-08-17 MED ORDER — GLUCOSE BLOOD VI STRP: ORAL_STRIP | 12 refills | Status: DC

## 2016-08-17 MED ORDER — TOPIRAMATE 25 MG PO TABS: 25.0000 mg | ORAL_TABLET | Freq: Two times a day (BID) | ORAL | 2 refills | Status: DC

## 2016-08-17 MED ORDER — LISINOPRIL 40 MG PO TABS: 40.0000 mg | ORAL_TABLET | Freq: Every day | ORAL | 3 refills | Status: DC

## 2016-08-17 MED ORDER — METFORMIN HCL 1000 MG PO TABS: 1000.0000 mg | ORAL_TABLET | Freq: Two times a day (BID) | ORAL | 1 refills | Status: DC

## 2016-08-17 MED ORDER — BENZONATATE 200 MG PO CAPS: 200.0000 mg | ORAL_CAPSULE | Freq: Two times a day (BID) | ORAL | 0 refills | Status: DC | PRN

## 2016-08-17 MED ORDER — GLIMEPIRIDE 4 MG PO TABS: 4.0000 mg | ORAL_TABLET | Freq: Every day | ORAL | 1 refills | Status: DC

## 2016-08-17 MED ORDER — ALBUTEROL SULFATE HFA 108 (90 BASE) MCG/ACT IN AERS: 1.0000 | INHALATION_SPRAY | Freq: Four times a day (QID) | RESPIRATORY_TRACT | 5 refills | Status: DC | PRN

## 2016-08-17 MED ORDER — HYDROXYZINE HCL 10 MG PO TABS: 10.0000 mg | ORAL_TABLET | Freq: Three times a day (TID) | ORAL | 0 refills | Status: DC | PRN

## 2016-08-17 NOTE — Telephone Encounter (Signed)
FWD to PCP. Tempestt S Roberts, CMA  

## 2016-08-17 NOTE — Patient Instructions (Signed)
Diabetes Mellitus and Food It is important for you to manage your blood sugar (glucose) level. Your blood glucose level can be greatly affected by what you eat. Eating healthier foods in the appropriate amounts throughout the day at about the same time each day will help you control your blood glucose level. It can also help slow or prevent worsening of your diabetes mellitus. Healthy eating may even help you improve the level of your blood pressure and reach or maintain a healthy weight. General recommendations for healthful eating and cooking habits include:  Eating meals and snacks regularly. Avoid going long periods of time without eating to lose weight.  Eating a diet that consists mainly of plant-based foods, such as fruits, vegetables, nuts, legumes, and whole grains.  Using low-heat cooking methods, such as baking, instead of high-heat cooking methods, such as deep frying.  Work with your dietitian to make sure you understand how to use the Nutrition Facts information on food labels. How can food affect me? Carbohydrates Carbohydrates affect your blood glucose level more than any other type of food. Your dietitian will help you determine how many carbohydrates to eat at each meal and teach you how to count carbohydrates. Counting carbohydrates is important to keep your blood glucose at a healthy level, especially if you are using insulin or taking certain medicines for diabetes mellitus. Alcohol Alcohol can cause sudden decreases in blood glucose (hypoglycemia), especially if you use insulin or take certain medicines for diabetes mellitus. Hypoglycemia can be a life-threatening condition. Symptoms of hypoglycemia (sleepiness, dizziness, and disorientation) are similar to symptoms of having too much alcohol. If your health care provider has given you approval to drink alcohol, do so in moderation and use the following guidelines:  Women should not have more than one drink per day, and men  should not have more than two drinks per day. One drink is equal to: ? 12 oz of beer. ? 5 oz of wine. ? 1 oz of hard liquor.  Do not drink on an empty stomach.  Keep yourself hydrated. Have water, diet soda, or unsweetened iced tea.  Regular soda, juice, and other mixers might contain a lot of carbohydrates and should be counted.  What foods are not recommended? As you make food choices, it is important to remember that all foods are not the same. Some foods have fewer nutrients per serving than other foods, even though they might have the same number of calories or carbohydrates. It is difficult to get your body what it needs when you eat foods with fewer nutrients. Examples of foods that you should avoid that are high in calories and carbohydrates but low in nutrients include:  Trans fats (most processed foods list trans fats on the Nutrition Facts label).  Regular soda.  Juice.  Candy.  Sweets, such as cake, pie, doughnuts, and cookies.  Fried foods.  What foods can I eat? Eat nutrient-rich foods, which will nourish your body and keep you healthy. The food you should eat also will depend on several factors, including:  The calories you need.  The medicines you take.  Your weight.  Your blood glucose level.  Your blood pressure level.  Your cholesterol level.  You should eat a variety of foods, including:  Protein. ? Lean cuts of meat. ? Proteins low in saturated fats, such as fish, egg whites, and beans. Avoid processed meats.  Fruits and vegetables. ? Fruits and vegetables that may help control blood glucose levels, such as apples,   mangoes, and yams.  Dairy products. ? Choose fat-free or low-fat dairy products, such as milk, yogurt, and cheese.  Grains, bread, pasta, and rice. ? Choose whole grain products, such as multigrain bread, whole oats, and brown rice. These foods may help control blood pressure.  Fats. ? Foods containing healthful fats, such as  nuts, avocado, olive oil, canola oil, and fish.  Does everyone with diabetes mellitus have the same meal plan? Because every person with diabetes mellitus is different, there is not one meal plan that works for everyone. It is very important that you meet with a dietitian who will help you create a meal plan that is just right for you. This information is not intended to replace advice given to you by your health care provider. Make sure you discuss any questions you have with your health care provider. Document Released: 12/22/2004 Document Revised: 09/02/2015 Document Reviewed: 02/21/2013 Elsevier Interactive Patient Education  2017 [employer] Inc[employer]  

## 2016-08-17 NOTE — Progress Notes (Signed)
Subjective:  Patient ID: [name], female    DOB: May 20, 1970  Age: 46 y.o. MRN: [id number]  CC: several problems   HPI [name] is a 46 y.o. female with a PMH of Anxiety, depression, DM2, Asthma, and HTN presents to address these issues. She has been recently let out of prison. Received all her meds from prison but she is now needing refills. Diabetic for 7 years now. Has been using Novolin 70/30 28 units BID and Metformin 500 mg BID. Endorses polydipsia, visual blurring, fatigue, and tinglin/numbness of the hands and feet. Does not have a log to report numbers. Needs refill of strips and lancets. In addition, she requests a refill of her asthma inhaler. Uses Asthma inhaler every other day. She is coughing and believes pollen is exacerbating her asthma. Furthermore, would like medication for her migraine headaches. Has a migraine a few times per week. Migraine does not relent until she takes NSAID and goes to sleep. Lastly, would like "something for my depression". Depressed since before going to prison and continues to struggle with the acceptance of the death of one of her children. No suicidal/homicidal ideation/intent. Does not endorse any other symptoms.     Outpatient Medications Prior to Visit  Medication Sig Dispense Refill  . albuterol (PROVENTIL HFA;VENTOLIN HFA) 108 (90 Base) MCG/ACT inhaler Inhale 1-2 puffs into the lungs every 6 (six) hours as needed for wheezing or shortness of breath.    . beclomethasone (QVAR) 40 MCG/ACT inhaler Inhale 2 puffs into the lungs 2 (two) times daily.    . insulin NPH-regular Human (NOVOLIN 70/30) (70-30) 100 UNIT/ML injection Inject 28 Units into the skin 2 (two) times daily at 10 AM and 5 PM. 10 mL 1  . benzonatate (TESSALON) 100 MG capsule Take 1 capsule (100 mg total) by mouth 3 (three) times daily as needed for cough. (Patient not taking: Reported on 08/17/2016) 21 capsule 0  . guaiFENesin-codeine 100-10 MG/5ML syrup Take 5 mLs by mouth 3  (three) times daily as needed for cough. (Patient not taking: Reported on 08/17/2016) 120 mL 0  . lisinopril (PRINIVIL,ZESTRIL) 5 MG tablet Take 5 mg by mouth daily.      . metFORMIN (GLUCOPHAGE) 500 MG tablet Take 2 tablets (1,000 mg total) by mouth 2 (two) times daily with a meal. (Patient not taking: Reported on 08/17/2016) 120 tablet 1  . ondansetron (ZOFRAN ODT) 8 MG disintegrating tablet Take 1 tablet (8 mg total) by mouth every 8 (eight) hours as needed for nausea or vomiting. (Patient not taking: Reported on 08/17/2016) 12 tablet 0  . OVER THE COUNTER MEDICATION Inhale 1 application into the lungs at bedtime. CPAP     No facility-administered medications prior to visit.      ROS Review of Systems  Constitutional: Negative for chills, fever and malaise/fatigue.  HENT: Positive for congestion.   Eyes: Negative for blurred vision.  Respiratory: Positive for cough. Negative for shortness of breath.   Cardiovascular: Negative for chest pain and palpitations.  Gastrointestinal: Negative for abdominal pain and nausea.  Genitourinary: Negative for dysuria and hematuria.  Musculoskeletal: Negative for joint pain and myalgias.  Skin: Negative for rash.  Neurological: Positive for headaches. Negative for tingling.  Psychiatric/Behavioral: Positive for depression. The patient is not nervous/anxious.     Objective:  BP 136/88 (BP Location: Right Arm, Patient Position: Sitting, Cuff Size: Large)   Pulse 77   Temp 98.1 F (36.7 C) (Oral)   Ht 5' 5.5" (1.664 m)   [employer]  257 lb (116.6 kg)   LMP 08/09/2016 (Exact Date)   SpO2 98%   BMI 42.12 kg/m   BP/Weight 08/17/2016 05/29/2016 04/25/2016  Systolic BP 136 135 136  Diastolic BP 88 80 55  Wt. (Lbs) 257 235 -  BMI 42.12 38.51 -      Physical Exam  Constitutional: She is oriented to person, place, and time.  Well developed, obese, NAD, reserved  HENT:  Head: Normocephalic and atraumatic.  Eyes: No scleral icterus.  Neck: Normal range of  motion. Neck supple. No thyromegaly present.  Cardiovascular: Normal rate, regular rhythm and normal heart sounds.   Pulmonary/Chest: Effort normal and breath sounds normal.  Musculoskeletal: She exhibits no edema.  Neurological: She is alert and oriented to person, place, and time.  Skin: Skin is warm and dry. No rash noted. No erythema. No pallor.  Psychiatric: She has a normal mood and affect. Her behavior is normal. Thought content normal.  Vitals reviewed.    Assessment & Plan:   1. Type 2 diabetes mellitus without complication, with long-term current use of insulin (HCC) - A1c 11.1% in clinic today - CBG 270 in clinic today - Microalbumin/Creatinine Ratio, Urine - Comprehensive metabolic panel - Begin glucose blood (ACCU-CHEK AVIVA PLUS) test strip; Use as instructed  Dispense: 100 each; Refill: 12 - Begin Lancets (ACCU-CHEK SOFT TOUCH) lancets; Use as instructed  Dispense: 100 each; Refill: 12 - Begin insulin detemir (LEVEMIR) 100 UNIT/ML injection; Inject 0.5 mLs (50 Units total) into the skin at bedtime.  Dispense: 10 mL; Refill: 11 - Begin metFORMIN (GLUCOPHAGE) 1000 MG tablet; Take 1 tablet (1,000 mg total) by mouth 2 (two) times daily with a meal.  Dispense: 180 tablet; Refill: 1   2. Hypothyroidism, unspecified type - Thyroid Panel With TSH  3. Hypertension, unspecified type - Begin lisinopril (PRINIVIL,ZESTRIL) 40 MG tablet; Take 1 tablet (40 mg total) by mouth daily.  Dispense: 90 tablet; Refill: 3 - Begin hydrochlorothiazide (HYDRODIURIL) 25 MG tablet; Take 1 tablet (25 mg total) by mouth daily.  Dispense: 90 tablet; Refill: 1 - Comprehensive metabolic panel - CBC with Differential  4. Cough - Begin benzonatate (TESSALON) 200 MG capsule; Take 1 capsule (200 mg total) by mouth 2 (two) times daily as needed for cough.  Dispense: 20 capsule; Refill: 0 - CBC with Differential  5. Seasonal allergic rhinitis due to pollen - Refill cetirizine (ZYRTEC) 10 MG tablet; Take  1 tablet (10 mg total) by mouth daily.  Dispense: 30 tablet; Refill: 2  6. Moderate asthma, unspecified whether complicated, unspecified whether persistent - Refill albuterol inhaler  7. Medication refill - Refill omeprazole (PRILOSEC) 20 MG capsule; Take 1 capsule (20 mg total) by mouth daily.  Dispense: 30 capsule; Refill: 2  8. Hyperlipidemia, unspecified hyperlipidemia type - refill pravastatin (PRAVACHOL) 10 MG tablet; Take 1 tablet (10 mg total) by mouth daily.  Dispense: 90 tablet; Refill: 1 - Lipid Panel  9. Depression, unspecified depression type - Begin sertraline (ZOLOFT) 100 MG tablet; Take 1 tablet (100 mg total) by mouth daily.  Dispense: 30 tablet; Refill: 3 - Begin hydrOXYzine (ATARAX/VISTARIL) 10 MG tablet; Take 1 tablet (10 mg total) by mouth 3 (three) times daily as needed.  Dispense: 30 tablet; Refill: 0  10. Other migraine with status migrainosus, intractable - Begin topiramate (TOPAMAX) 25 MG tablet; Take 1 tablet (25 mg total) by mouth 2 (two) times daily.  Dispense: 30 tablet; Refill: 2 - Begin SUMAtriptan (IMITREX) 25 MG tablet; Take 1 tablet (25 mg  total) by mouth daily. May take one more tablet two hours after the first. No more than two tablets per day.  Dispense: 30 tablet; Refill: 0   Meds ordered this encounter  Medications  . glucose blood (ACCU-CHEK AVIVA PLUS) test strip    Sig: Use as instructed    Dispense:  100 each    Refill:  12    Order Specific Question:   Supervising Provider    Answer:   Quentin AngstJEGEDE, OLUGBEMIGA E [[id number]]  . Lancets (ACCU-CHEK SOFT TOUCH) lancets    Sig: Use as instructed    Dispense:  100 each    Refill:  12    Order Specific Question:   Supervising Provider    Answer:   Quentin AngstJEGEDE, OLUGBEMIGA E [[id number]]  . lisinopril (PRINIVIL,ZESTRIL) 40 MG tablet    Sig: Take 1 tablet (40 mg total) by mouth daily.    Dispense:  90 tablet    Refill:  3    Order Specific Question:   Supervising Provider    Answer:   Quentin AngstJEGEDE, OLUGBEMIGA E  [id number]]  . insulin detemir (LEVEMIR) 100 UNIT/ML injection    Sig: Inject 0.5 mLs (50 Units total) into the skin at bedtime.    Dispense:  10 mL    Refill:  11    Order Specific Question:   Supervising Provider    Answer:   Quentin AngstJEGEDE, OLUGBEMIGA E [id number]  . cetirizine (ZYRTEC) 10 MG tablet    Sig: Take 1 tablet (10 mg total) by mouth daily.    Dispense:  30 tablet    Refill:  2    Order Specific Question:   Supervising Provider    Answer:   Quentin AngstJEGEDE, OLUGBEMIGA E [id number]  . hydrochlorothiazide (HYDRODIURIL) 25 MG tablet    Sig: Take 1 tablet (25 mg total) by mouth daily.    Dispense:  90 tablet    Refill:  1    Order Specific Question:   Supervising Provider    Answer:   Quentin AngstJEGEDE, OLUGBEMIGA E [id number]]  . metFORMIN (GLUCOPHAGE) 1000 MG tablet    Sig: Take 1 tablet (1,000 mg total) by mouth 2 (two) times daily with a meal.    Dispense:  180 tablet    Refill:  1    Order Specific Question:   Supervising Provider    Answer:   Quentin AngstJEGEDE, OLUGBEMIGA E [id number]]  . omeprazole (PRILOSEC) 20 MG capsule    Sig: Take 1 capsule (20 mg total) by mouth daily.    Dispense:  30 capsule    Refill:  2    Order Specific Question:   Supervising Provider    Answer:   Quentin AngstJEGEDE, OLUGBEMIGA E [id number]  . pravastatin (PRAVACHOL) 10 MG tablet    Sig: Take 1 tablet (10 mg total) by mouth daily.    Dispense:  90 tablet    Refill:  1    Order Specific Question:   Supervising Provider    Answer:   Quentin AngstJEGEDE, OLUGBEMIGA E [id number]]  . sertraline (ZOLOFT) 100 MG tablet    Sig: Take 1 tablet (100 mg total) by mouth daily.    Dispense:  30 tablet    Refill:  3    Order Specific Question:   Supervising Provider    Answer:   Quentin AngstJEGEDE, OLUGBEMIGA E [id number]]  . hydrOXYzine (ATARAX/VISTARIL) 10 MG tablet    Sig: Take 1 tablet (10 mg total) by mouth 3 (three) times daily as needed.    Dispense:  30 tablet  Refill:  0    Order Specific Question:   Supervising Provider    Answer:   Quentin Angst [id number]   . benzonatate (TESSALON) 200 MG capsule    Sig: Take 1 capsule (200 mg total) by mouth 2 (two) times daily as needed for cough.    Dispense:  20 capsule    Refill:  0    Order Specific Question:   Supervising Provider    Answer:   Quentin Angst [id number]  . topiramate (TOPAMAX) 25 MG tablet    Sig: Take 1 tablet (25 mg total) by mouth 2 (two) times daily.    Dispense:  30 tablet    Refill:  2    Order Specific Question:   Supervising Provider    Answer:   Quentin Angst [id number]  . SUMAtriptan (IMITREX) 25 MG tablet    Sig: Take 1 tablet (25 mg total) by mouth daily. May take one more tablet two hours after the first. No more than two tablets per day.    Dispense:  30 tablet    Refill:  0    Order Specific Question:   Supervising Provider    Answer:   Quentin Angst [id number]  . glimepiride (AMARYL) 4 MG tablet    Sig: Take 1 tablet (4 mg total) by mouth daily before breakfast.    Dispense:  90 tablet    Refill:  1    Order Specific Question:   Supervising Provider    Answer:   Quentin Angst [id number]    Follow-up: Return in about 2 weeks (around 08/31/2016) for Diabetes and bring glucose log.   Loletta Specter PA

## 2016-08-17 NOTE — Telephone Encounter (Signed)
Pharmacy called left voice mail stated:  Needs to know how many times per day patient will be testing.  Please call back at [phone]

## 2016-08-18 LAB — COMPREHENSIVE METABOLIC PANEL
A/G RATIO: 1.4 (ref 1.2–2.2)
ALBUMIN: 4 g/dL (ref 3.5–5.5)
ALT: 299 IU/L — ABNORMAL HIGH (ref 0–32)
AST: 215 IU/L — ABNORMAL HIGH (ref 0–40)
Alkaline Phosphatase: 162 IU/L — ABNORMAL HIGH (ref 39–117)
BUN / CREAT RATIO: 11 (ref 9–23)
BUN: 7 mg/dL (ref 6–24)
Bilirubin Total: 0.3 mg/dL (ref 0.0–1.2)
CALCIUM: 8.8 mg/dL (ref 8.7–10.2)
CO2: 22 mmol/L (ref 18–29)
CREATININE: 0.66 mg/dL (ref 0.57–1.00)
Chloride: 102 mmol/L (ref 96–106)
GFR, EST AFRICAN AMERICAN: 121 mL/min/{1.73_m2} (ref >59)
GFR, EST NON AFRICAN AMERICAN: 105 mL/min/{1.73_m2} (ref >59)
GLOBULIN, TOTAL: 2.9 g/dL (ref 1.5–4.5)
Glucose: 218 mg/dL — ABNORMAL HIGH (ref 65–99)
Potassium: 4 mmol/L (ref 3.5–5.2)
SODIUM: 138 mmol/L (ref 134–144)
Total Protein: 6.9 g/dL (ref 6.0–8.5)

## 2016-08-18 LAB — LIPID PANEL
CHOL/HDL RATIO: 3.3 ratio (ref 0.0–4.4)
Cholesterol, Total: 159 mg/dL (ref 100–199)
HDL: 48 mg/dL (ref >39)
LDL CALC: 98 mg/dL (ref 0–99)
TRIGLYCERIDES: 66 mg/dL (ref 0–149)
VLDL Cholesterol Cal: 13 mg/dL (ref 5–40)

## 2016-08-18 LAB — CBC WITH DIFFERENTIAL/PLATELET
BASOS: 0 %
Basophils Absolute: 0 10*3/uL (ref 0.0–0.2)
EOS (ABSOLUTE): 0.1 10*3/uL (ref 0.0–0.4)
EOS: 1 %
Hematocrit: 39.5 % (ref 34.0–46.6)
Hemoglobin: 12.9 g/dL (ref 11.1–15.9)
IMMATURE GRANULOCYTES: 0 %
Immature Grans (Abs): 0 10*3/uL (ref 0.0–0.1)
Lymphocytes Absolute: 3.6 10*3/uL — ABNORMAL HIGH (ref 0.7–3.1)
Lymphs: 37 %
MCH: 24.8 pg — AB (ref 26.6–33.0)
MCHC: 32.7 g/dL (ref 31.5–35.7)
MCV: 76 fL — AB (ref 79–97)
MONOS ABS: 0.5 10*3/uL (ref 0.1–0.9)
Monocytes: 5 %
NEUTROS ABS: 5.6 10*3/uL (ref 1.4–7.0)
Neutrophils: 57 %
Platelets: 306 10*3/uL (ref 150–379)
RBC: 5.21 x10E6/uL (ref 3.77–5.28)
RDW: 16.4 % — AB (ref 12.3–15.4)
WBC: 9.7 10*3/uL (ref 3.4–10.8)

## 2016-08-18 LAB — MICROALBUMIN / CREATININE URINE RATIO
CREATININE, UR: 104.9 mg/dL
MICROALB/CREAT RATIO: 28.3 mg/g{creat} (ref 0.0–30.0)
MICROALBUM., U, RANDOM: 29.7 ug/mL

## 2016-08-18 LAB — THYROID PANEL WITH TSH
Free Thyroxine Index: 2.2 (ref 1.2–4.9)
T3 Uptake Ratio: 27 % (ref 24–39)
T4, Total: 8.2 ug/dL (ref 4.5–12.0)
TSH: 1.4 u[IU]/mL (ref 0.450–4.500)

## 2016-08-18 NOTE — Telephone Encounter (Signed)
I called [facility] and stated patient should be testing glucose TID.

## 2016-08-22 ENCOUNTER — Other Ambulatory Visit (INDEPENDENT_AMBULATORY_CARE_PROVIDER_SITE_OTHER): Payer: Self-pay | Admitting: Physician Assistant

## 2016-08-22 DIAGNOSIS — R059 Cough, unspecified: Secondary | ICD-10-CM

## 2016-08-22 DIAGNOSIS — R05 Cough: Secondary | ICD-10-CM

## 2016-08-22 MED ORDER — DEXTROMETHORPHAN HBR 10 MG/15ML PO SYRP: 15.0000 mL | ORAL_SOLUTION | Freq: Two times a day (BID) | ORAL | 0 refills | Status: DC

## 2016-08-22 NOTE — Progress Notes (Signed)
Could not afford Tessalon perles at $16.

## 2016-08-23 ENCOUNTER — Other Ambulatory Visit (INDEPENDENT_AMBULATORY_CARE_PROVIDER_SITE_OTHER): Payer: Self-pay | Admitting: Physician Assistant

## 2016-08-23 DIAGNOSIS — R059 Cough, unspecified: Secondary | ICD-10-CM

## 2016-08-23 DIAGNOSIS — R05 Cough: Secondary | ICD-10-CM

## 2016-08-23 MED ORDER — DEXTROMETHORPHAN POLISTIREX ER 30 MG/5ML PO SUER: 60.0000 mg | Freq: Two times a day (BID) | ORAL | 0 refills | Status: AC

## 2016-08-23 NOTE — Progress Notes (Signed)
Resent rx

## 2016-08-30 ENCOUNTER — Ambulatory Visit (INDEPENDENT_AMBULATORY_CARE_PROVIDER_SITE_OTHER): Payer: Medicaid Other | Admitting: Physician Assistant

## 2016-09-07 ENCOUNTER — Encounter (INDEPENDENT_AMBULATORY_CARE_PROVIDER_SITE_OTHER): Payer: Self-pay | Admitting: Physician Assistant

## 2016-09-07 ENCOUNTER — Ambulatory Visit (INDEPENDENT_AMBULATORY_CARE_PROVIDER_SITE_OTHER): Payer: Medicaid Other | Admitting: Physician Assistant

## 2016-09-07 VITALS — BP 123/82 | HR 80 | Temp 97.9°F | Wt 247.2 lb

## 2016-09-07 DIAGNOSIS — R740 Nonspecific elevation of levels of transaminase and lactic acid dehydrogenase [LDH]: Secondary | ICD-10-CM

## 2016-09-07 DIAGNOSIS — R1013 Epigastric pain: Secondary | ICD-10-CM

## 2016-09-07 DIAGNOSIS — R1011 Right upper quadrant pain: Secondary | ICD-10-CM

## 2016-09-07 DIAGNOSIS — E119 Type 2 diabetes mellitus without complications: Secondary | ICD-10-CM | POA: Diagnosis not present

## 2016-09-07 DIAGNOSIS — R7401 Elevation of levels of liver transaminase levels: Secondary | ICD-10-CM

## 2016-09-07 DIAGNOSIS — G43909 Migraine, unspecified, not intractable, without status migrainosus: Secondary | ICD-10-CM | POA: Diagnosis not present

## 2016-09-07 MED ORDER — NAPROXEN 500 MG PO TABS: 500.0000 mg | ORAL_TABLET | Freq: Two times a day (BID) | ORAL | 0 refills | Status: DC

## 2016-09-07 MED ORDER — INSULIN DETEMIR 100 UNIT/ML ~~LOC~~ SOLN: 60.0000 [IU] | Freq: Every day | SUBCUTANEOUS | 11 refills | Status: DC

## 2016-09-07 NOTE — Patient Instructions (Signed)
Acute Pancreatitis  Acute pancreatitis is a condition in which the pancreas suddenly becomes irritated and swollen (has inflammation). The pancreas is a gland that is located behind the stomach. It produces enzymes that help to digest food. The pancreas also releases the hormones glucagon and insulin, which help to regulate blood sugar. Damage to the pancreas occurs when the digestive enzymes from the pancreas are activated before they are released into the intestine.  Most acute attacks last a couple of days and can cause serious problems. Some people become dehydrated and develop low blood pressure. In severe cases, bleeding into the pancreas can lead to shock and can be life-threatening. The lungs, heart, and kidneys may fail.  What are the causes?  The most common causes of this condition are:  · Alcohol abuse.  · Gallstones.    Other causes include:  · Certain medicines.  · Exposure to certain chemicals.  · Infection.  · Damage caused by an accident (trauma).  · Abdominal surgery.    In some cases, the cause may not be known.  What are the signs or symptoms?  Symptoms of this condition include:  · Pain in the upper abdomen that may radiate to the back.  · Tenderness and swelling of the abdomen.  · Nausea and vomiting.    How is this diagnosed?  This condition may be diagnosed based on:  · A physical exam.  · Blood tests.  · Imaging tests, such as X-rays, CT scans, or an ultrasound of the abdomen.    How is this treated?  Treatment for this condition usually requires a stay in the hospital. Treatment may include:  · Pain medicine.  · Fluid replacement through an IV tube.  · Placing a tube in the stomach to remove stomach contents and to control vomiting (NG tube, or nasogastric tube).  · Not eating for 3-4 days. This gives the pancreas a rest, because enzymes are not being produced that can cause further damage.  · Antibiotic medicines, if your condition is caused by an infection.  · Surgery on the pancreas or  gallbladder.    Follow these instructions at home:  Eating and drinking  · Follow instructions from your health care provider about diet. This may involve avoiding alcohol and decreasing the amount of fat in your diet.  · Eat smaller, more frequent meals. This reduces the amount of digestive fluids that the pancreas produces.  · Drink enough fluid to keep your urine clear or pale yellow.  · Do not drink alcohol if it caused your condition.  General instructions  · Take over-the-counter and prescription medicines only as told by your health care provider.  · Do not use any tobacco products, such as cigarettes, chewing tobacco, and e-cigarettes. If you need help quitting, ask your health care provider.  · Get plenty of rest.  · If directed, check your blood sugar at home as told by your health care provider.  · Keep all follow-up visits as told by your health care provider. This is important.  Contact a health care provider if:  · You do not recover as quickly as expected.  · You develop new or worsening symptoms.  · You have persistent pain, weakness, or nausea.  · You recover and then have another episode of pain.  · You have a fever.  Get help right away if:  · You cannot eat or keep fluids down.  · Your pain becomes severe.  · Your skin or the   white part of your eyes turns yellow (jaundice).  · You vomit.  · You feel dizzy or you faint.  · Your blood sugar is high (over 300 mg/dL).  This information is not intended to replace advice given to you by your health care provider. Make sure you discuss any questions you have with your health care provider.  Document Released: 03/27/2005 Document Revised: 08/04/2015 Document Reviewed: 12/29/2014  Elsevier Interactive Patient Education © 2018 [employer]

## 2016-09-07 NOTE — Progress Notes (Signed)
Subjective:  Patient ID: [name], female    DOB: [birth year]  Age: 46 y.o. MRN: [id number]  CC: f/u DM  HPI [name] is a 46 y.o. female with a PMH of HTN, DM2, Anxiety, and Depression presents for f/u of DM2. Glucose readings regularly in the 200s. Low 130 and high 340. Taking anti-glycemics as directed.     Main complaint today is of chronic RUQ and epigastric pain. Said that prison doctors would tell her "it is gas". However, pt said pain persists and can not be gas. Feels epigastric pain radiate posteriorly to the back. Feels worse after eating a meal. Associated abdominal bloating. She was also found to have transaminitis in recent labs. Denies f/c/n/v, melena, BRBPR, CP, SOB, HA, presyncope, syncope, rash, bruising, or GU sxs.     Lastly, wants something stronger for her headaches. Was prescribed topiramate and sumatriptan at her last visit. Says sumatriptan works but headache would return.      ROS Review of Systems  Constitutional: Negative for chills, fever and malaise/fatigue.  Eyes: Negative for blurred vision.  Respiratory: Negative for shortness of breath.   Cardiovascular: Negative for chest pain and palpitations.  Gastrointestinal: Positive for abdominal pain and heartburn. Negative for blood in stool, constipation, diarrhea, melena, nausea and vomiting.  Genitourinary: Negative for dysuria and hematuria.  Musculoskeletal: Negative for joint pain and myalgias.  Skin: Negative for rash.  Neurological: Positive for headaches. Negative for tingling.  Psychiatric/Behavioral: Negative for depression. The patient is not nervous/anxious.     Objective:  BP 123/82 (BP Location: Right Arm, Patient Position: Sitting, Cuff Size: Large)   Pulse 80   Temp 97.9 F (36.6 C) (Oral)   Wt 247 lb 3.2 oz (112.1 kg)   LMP 08/09/2016 (Exact Date)   SpO2 96%   BMI 40.51 kg/m   BP/Weight 09/07/2016 08/17/2016 05/29/2016  Systolic BP 123 136 135  Diastolic BP 82 88 80  Wt.  (Lbs) 247.2 257 235  BMI 40.51 42.12 38.51      Physical Exam  Constitutional: She is oriented to person, place, and time.  Well developed, obese, NAD, polite  HENT:  Head: Normocephalic and atraumatic.  Eyes: Conjunctivae are normal. No scleral icterus.  Neck: Normal range of motion. Neck supple.  Cardiovascular: Normal rate, regular rhythm and normal heart sounds.   Pulmonary/Chest: Effort normal and breath sounds normal.  Abdominal: Soft. Bowel sounds are normal. She exhibits no distension and no mass. There is tenderness (mild RUQ and epigastric TTP). There is no rebound and no guarding.  Musculoskeletal: She exhibits no edema.  Neurological: She is alert and oriented to person, place, and time. No cranial nerve deficit. Coordination normal.  Skin: Skin is warm and dry. No rash noted. No erythema. No pallor.  Psychiatric: She has a normal mood and affect. Her behavior is normal. Thought content normal.  Vitals reviewed.    Assessment & Plan:   1. Type 2 diabetes mellitus without complication, without long-term current use of insulin (HCC) - Increase insulin detemir (LEVEMIR) 100 UNIT/ML injection; Inject 0.6 mLs (60 Units total) into the skin at bedtime.  Dispense: 10 mL; Refill: 11  2. Epigastric pain - US Abdomen Limited RUQ; Future - Lipase  3. RUQ pain - US Abdomen Limited RUQ; Future - Begin naproxen (NAPROSYN) 500 MG tablet; Take 1 tablet (500 mg total) by mouth 2 (two) times daily with a meal.  Dispense: 14 tablet; Refill: 0  4. Transaminitis - US Abdomen Limited RUQ; Future -  Hepatitis panel, acute - Hepatic Function Panel  5. Migraine without status migrainosus, not intractable, unspecified migraine type - Ambulatory referral to Neurology - Continue on Topiramate and Sumatriptan  Meds ordered this encounter  Medications  . naproxen (NAPROSYN) 500 MG tablet    Sig: Take 1 tablet (500 mg total) by mouth 2 (two) times daily with a meal.    Dispense:  14  tablet    Refill:  0    Order Specific Question:   Supervising Provider    Answer:   Quentin AngstJEGEDE, OLUGBEMIGA E [id number]]  . insulin detemir (LEVEMIR) 100 UNIT/ML injection    Sig: Inject 0.6 mLs (60 Units total) into the skin at bedtime.    Dispense:  10 mL    Refill:  11    Order Specific Question:   Supervising Provider    Answer:   Quentin AngstJEGEDE, OLUGBEMIGA E [[id number]]    Follow-up: Return in about 4 weeks (around 10/05/2016) for f/u abdominal pain DM2.   Loletta Specteroger David Bayron Dalto PA

## 2016-09-08 LAB — HEPATIC FUNCTION PANEL
ALT: 35 IU/L — ABNORMAL HIGH (ref 0–32)
AST: 27 IU/L (ref 0–40)
Albumin: 4.4 g/dL (ref 3.5–5.5)
Alkaline Phosphatase: 131 IU/L — ABNORMAL HIGH (ref 39–117)
BILIRUBIN TOTAL: 0.2 mg/dL (ref 0.0–1.2)
Bilirubin, Direct: 0.06 mg/dL (ref 0.00–0.40)
Total Protein: 7.4 g/dL (ref 6.0–8.5)

## 2016-09-08 LAB — LIPASE: LIPASE: 22 U/L (ref 14–72)

## 2016-09-08 LAB — HEPATITIS PANEL, ACUTE
HEP C VIRUS AB: 0.4 {s_co_ratio} (ref 0.0–0.9)
Hep A IgM: NEGATIVE
Hep B C IgM: NEGATIVE
Hepatitis B Surface Ag: NEGATIVE

## 2016-09-11 ENCOUNTER — Ambulatory Visit (INDEPENDENT_AMBULATORY_CARE_PROVIDER_SITE_OTHER): Payer: Medicaid Other | Admitting: Physician Assistant

## 2016-09-11 ENCOUNTER — Encounter (INDEPENDENT_AMBULATORY_CARE_PROVIDER_SITE_OTHER): Payer: Self-pay | Admitting: Physician Assistant

## 2016-09-11 VITALS — BP 116/82 | HR 95 | Temp 98.1°F | Resp 16 | Ht 65.5 in | Wt 241.0 lb

## 2016-09-11 DIAGNOSIS — E1143 Type 2 diabetes mellitus with diabetic autonomic (poly)neuropathy: Secondary | ICD-10-CM | POA: Diagnosis not present

## 2016-09-11 DIAGNOSIS — R8290 Unspecified abnormal findings in urine: Secondary | ICD-10-CM | POA: Diagnosis not present

## 2016-09-11 DIAGNOSIS — K3184 Gastroparesis: Secondary | ICD-10-CM | POA: Diagnosis not present

## 2016-09-11 LAB — POCT URINALYSIS DIPSTICK
BILIRUBIN UA: NEGATIVE
Glucose, UA: NEGATIVE
Ketones, UA: NEGATIVE
Leukocytes, UA: NEGATIVE
NITRITE UA: NEGATIVE
PH UA: 5 (ref 5.0–8.0)
RBC UA: NEGATIVE
Urobilinogen, UA: 0.2 E.U./dL

## 2016-09-11 MED ORDER — METOCLOPRAMIDE HCL 5 MG PO TABS: 5.0000 mg | ORAL_TABLET | Freq: Three times a day (TID) | ORAL | 1 refills | Status: DC

## 2016-09-11 NOTE — Progress Notes (Signed)
Subjective:  Patient ID: [name], female    DOB: 05/14/1970  Age: 46 y.o. MRN: [id number]  CC: here for PPD placement  HPI [name] is a 46 y.o. female here for PPD placement. However, she complains of "penicillin" smell to urine. Also of epigastric pain. There is associated anorexia and constipation. Last A1c 11.1. Has recently begun to control blood glucose better and thinks epigastric pain and its associated symptoms are decreasing with better glucose control. Labs from 4 days ago revealed slightly elevated liver enzymes improved over previous LFTs. Normal lipase, negative Hepatitis panel. Labs from two weeks ago revealed normal lipid panel, WBC, RBC, Plts, thyroid panel, and microalb/creat ratio. Does not endorse any other symptoms.   Outpatient Medications Prior to Visit  Medication Sig Dispense Refill  . albuterol (PROVENTIL HFA;VENTOLIN HFA) 108 (90 Base) MCG/ACT inhaler Inhale 1-2 puffs into the lungs every 6 (six) hours as needed for wheezing or shortness of breath. 1 Inhaler 5  . aspirin EC 81 MG tablet Take 81 mg by mouth once.    . beclomethasone (QVAR) 40 MCG/ACT inhaler Inhale 2 puffs into the lungs 2 (two) times daily.    . benzonatate (TESSALON) 200 MG capsule Take 1 capsule (200 mg total) by mouth 2 (two) times daily as needed for cough. 20 capsule 0  . cetirizine (ZYRTEC) 10 MG tablet Take 1 tablet (10 mg total) by mouth daily. 30 tablet 2  . docusate sodium (COLACE) 100 MG capsule Take 100 mg by mouth daily.    Marland Kitchen glimepiride (AMARYL) 4 MG tablet Take 1 tablet (4 mg total) by mouth daily before breakfast. 90 tablet 1  . glucose blood (ACCU-CHEK AVIVA PLUS) test strip Use as instructed 100 each 12  . hydrochlorothiazide (HYDRODIURIL) 25 MG tablet Take 1 tablet (25 mg total) by mouth daily. 90 tablet 1  . hydrOXYzine (ATARAX/VISTARIL) 10 MG tablet Take 1 tablet (10 mg total) by mouth 3 (three) times daily as needed. 30 tablet 0  . insulin detemir (LEVEMIR) 100  UNIT/ML injection Inject 0.6 mLs (60 Units total) into the skin at bedtime. 10 mL 11  . lactulose (CHRONULAC) 10 GM/15ML solution Take 10 g by mouth 2 (two) times daily as needed for mild constipation.    . Lancets (ACCU-CHEK SOFT TOUCH) lancets Use as instructed 100 each 12  . lisinopril (PRINIVIL,ZESTRIL) 40 MG tablet Take 1 tablet (40 mg total) by mouth daily. 90 tablet 3  . metFORMIN (GLUCOPHAGE) 1000 MG tablet Take 1 tablet (1,000 mg total) by mouth 2 (two) times daily with a meal. 180 tablet 1  . naproxen (NAPROSYN) 500 MG tablet Take 1 tablet (500 mg total) by mouth 2 (two) times daily with a meal. 14 tablet 0  . pravastatin (PRAVACHOL) 10 MG tablet Take 1 tablet (10 mg total) by mouth daily. 90 tablet 1  . sertraline (ZOLOFT) 100 MG tablet Take 1 tablet (100 mg total) by mouth daily. 30 tablet 3  . SUMAtriptan (IMITREX) 25 MG tablet Take 1 tablet (25 mg total) by mouth daily. May take one more tablet two hours after the first. No more than two tablets per day. 30 tablet 0  . topiramate (TOPAMAX) 25 MG tablet Take 1 tablet (25 mg total) by mouth 2 (two) times daily. 30 tablet 2   No facility-administered medications prior to visit.      ROS Review of Systems  Constitutional: Negative for chills, fever and malaise/fatigue.  Eyes: Negative for blurred vision.  Respiratory: Negative for shortness  of breath.   Cardiovascular: Negative for chest pain and palpitations.  Gastrointestinal: Positive for abdominal pain (epigastric pain). Negative for nausea.  Genitourinary: Negative for dysuria and hematuria.       Urinary odor  Musculoskeletal: Negative for joint pain and myalgias.  Skin: Negative for rash.  Neurological: Negative for tingling and headaches.  Psychiatric/Behavioral: Negative for depression. The patient is not nervous/anxious.     Objective:  BP 116/82 (BP Location: Right Arm, Patient Position: Sitting, Cuff Size: Large)   Pulse 95   Temp 98.1 F (36.7 C) (Oral)    Resp 16   Ht 5' 5.5" (1.664 m)   Wt 241 lb (109.3 kg)   SpO2 98%   BMI 39.49 kg/m   BP/Weight 09/11/2016 09/07/2016 08/17/2016  Systolic BP 116 123 136  Diastolic BP 82 82 88  Wt. (Lbs) 241 247.2 257  BMI 39.49 40.51 42.12      Physical Exam  Constitutional: She is oriented to person, place, and time.  Well developed, obese, in discomfort, polite  HENT:  Head: Normocephalic and atraumatic.  Eyes: No scleral icterus.  Neck: Normal range of motion. Neck supple. No thyromegaly present.  Cardiovascular: Normal rate, regular rhythm and normal heart sounds.   Pulmonary/Chest: Effort normal and breath sounds normal.  Abdominal: Soft. Bowel sounds are normal. She exhibits no distension and no mass. There is tenderness (mild epigastric TTP). There is no rebound and no guarding.  Musculoskeletal: She exhibits no edema.  Neurological: She is alert and oriented to person, place, and time. No cranial nerve deficit. Coordination normal.  Skin: Skin is warm and dry. No rash noted. No erythema. No pallor.  Psychiatric: She has a normal mood and affect. Her behavior is normal. Thought content normal.  Vitals reviewed.    Assessment & Plan:   1. Bad odor of urine - Urinalysis Dipstick negative in clinic today.  2. Gastroparesis due to DM (HCC) - Patient's blood sugars are improving and abdominal pain seems to directly correlate with blood sugar control. Somewhat less pain with better blood glucose control. - Begin metoCLOPramide (REGLAN) 5 MG tablet; Take 1 tablet (5 mg total) by mouth 3 (three) times daily before meals.  Dispense: 90 tablet; Refill: 1 - I have advised pt to go to ED if pain worsens despite use of reglan and better glucose control.   Meds ordered this encounter  Medications  . DISCONTD: metoCLOPramide (REGLAN) 5 MG tablet    Sig: Take 1 tablet (5 mg total) by mouth 3 (three) times daily before meals.    Dispense:  90 tablet    Refill:  1    Order Specific Question:    Supervising Provider    Answer:   Quentin AngstJEGEDE, OLUGBEMIGA E [id number]  . metoCLOPramide (REGLAN) 5 MG tablet    Sig: Take 1 tablet (5 mg total) by mouth 3 (three) times daily before meals.    Dispense:  90 tablet    Refill:  1    Order Specific Question:   Supervising Provider    Answer:   Quentin AngstJEGEDE, OLUGBEMIGA E [[id number]]    Follow-up: Return for keep appointment.   Loletta Specteroger David Minor Iden PA

## 2016-09-11 NOTE — Patient Instructions (Signed)
Go to ED if no better within a few days of taking Reglan. Go to ED if at anytime the pain becomes more severe.

## 2016-09-14 ENCOUNTER — Encounter (HOSPITAL_COMMUNITY): Payer: Self-pay | Admitting: Emergency Medicine

## 2016-09-14 ENCOUNTER — Ambulatory Visit (HOSPITAL_COMMUNITY): Payer: Medicaid Other

## 2016-09-14 ENCOUNTER — Encounter (INDEPENDENT_AMBULATORY_CARE_PROVIDER_SITE_OTHER): Payer: Self-pay | Admitting: Physician Assistant

## 2016-09-14 ENCOUNTER — Ambulatory Visit (INDEPENDENT_AMBULATORY_CARE_PROVIDER_SITE_OTHER): Payer: Medicaid Other | Admitting: Physician Assistant

## 2016-09-14 ENCOUNTER — Emergency Department (HOSPITAL_COMMUNITY)
Admission: EM | Admit: 2016-09-14 | Discharge: 2016-09-14 | Disposition: A | Discharge status: Home / Self Care | Payer: Medicaid Other | Attending: Emergency Medicine | Admitting: Emergency Medicine

## 2016-09-14 DIAGNOSIS — Z7982 Long term (current) use of aspirin: Secondary | ICD-10-CM | POA: Diagnosis not present

## 2016-09-14 DIAGNOSIS — I10 Essential (primary) hypertension: Secondary | ICD-10-CM | POA: Insufficient documentation

## 2016-09-14 DIAGNOSIS — T783XXA Angioneurotic edema, initial encounter: Secondary | ICD-10-CM | POA: Diagnosis not present

## 2016-09-14 DIAGNOSIS — Z79899 Other long term (current) drug therapy: Secondary | ICD-10-CM | POA: Diagnosis not present

## 2016-09-14 DIAGNOSIS — E119 Type 2 diabetes mellitus without complications: Secondary | ICD-10-CM | POA: Diagnosis not present

## 2016-09-14 DIAGNOSIS — Z111 Encounter for screening for respiratory tuberculosis: Secondary | ICD-10-CM

## 2016-09-14 DIAGNOSIS — F17210 Nicotine dependence, cigarettes, uncomplicated: Secondary | ICD-10-CM | POA: Insufficient documentation

## 2016-09-14 DIAGNOSIS — Z7984 Long term (current) use of oral hypoglycemic drugs: Secondary | ICD-10-CM | POA: Insufficient documentation

## 2016-09-14 DIAGNOSIS — T7840XA Allergy, unspecified, initial encounter: Secondary | ICD-10-CM | POA: Diagnosis present

## 2016-09-14 LAB — READ PPD
Induration: 0 mm
TB Skin Test: NEGATIVE

## 2016-09-14 MED ORDER — DIPHENHYDRAMINE HCL 25 MG PO CAPS: 25.0000 mg | ORAL_CAPSULE | ORAL | 0 refills | Status: DC | PRN

## 2016-09-14 MED ORDER — SODIUM CHLORIDE 0.9 % IV SOLN
INTRAVENOUS | Status: DC
  Administered 2016-09-14: 10:00:00 via INTRAVENOUS
  Filled 2016-09-14: qty 1000

## 2016-09-14 MED ORDER — DIPHENHYDRAMINE HCL 50 MG/ML IJ SOLN
25.0000 mg | Freq: Once | INTRAMUSCULAR | Status: AC
  Administered 2016-09-14: 25 mg via INTRAVENOUS
  Filled 2016-09-14: qty 1

## 2016-09-14 MED ORDER — PREDNISONE 20 MG PO TABS: 40.0000 mg | ORAL_TABLET | Freq: Every day | ORAL | 0 refills | Status: DC

## 2016-09-14 MED ORDER — METHYLPREDNISOLONE SODIUM SUCC 125 MG IJ SOLR
125.0000 mg | Freq: Once | INTRAMUSCULAR | Status: AC
  Administered 2016-09-14: 125 mg via INTRAVENOUS
  Filled 2016-09-14: qty 2

## 2016-09-14 NOTE — ED Notes (Signed)
Pt reports she does not feel her swelling has gotten any better or any worse. MD notified and advises continued monitoring. NAD.

## 2016-09-14 NOTE — ED Notes (Signed)
[place] sandwich and crackers given with water. Pt able to tolerate.

## 2016-09-14 NOTE — ED Provider Notes (Signed)
[facility]EMER[facility]ENCY DEPT Provider Note   CSN: [id number] Arrival date & time: 09/14/16  0800   History   Chief Complaint Chief Complaint  Patient presents with  . Allergic Reaction    HPI [name] is a 46 y.o. female.  Patient reports she started Reglan about 2 days ago and woke up this morning and noticed her tongue was swollen.  She denies rash elsewhere, SOB.  She reports dysphagia, noting she has a sensation that things are getting stuck in her throat.  She reports baseline nausea w/ vomiting multiple times daily.  She denies hematemesis.  She denies constipation, diarrhea.  She has not taken any medications for symptoms yet.  She called EMS straight away after waking up this am.  Nothing like this has ever happened to her before.  No known allergies.      Past Medical History:  Diagnosis Date  . Anxiety   . Depression   . Diabetes mellitus   . Hypertension     Patient Active Problem List   Diagnosis Date Noted  . Type 2 diabetes mellitus without complication, without long-term current use of insulin (HCC) 08/17/2016    Past Surgical History:  Procedure Laterality Date  . BTL      OB History    Gravida Para Term Preterm AB Living   5       2 3    SAB TAB Ectopic Multiple Live Births   1 1             Home Medications    Prior to Admission medications   Medication Sig Start Date End Date Taking? Authorizing Provider  albuterol (PROVENTIL HFA;VENTOLIN HFA) 108 (90 Base) MCG/ACT inhaler Inhale 1-2 puffs into the lungs every 6 (six) hours as needed for wheezing or shortness of breath. 08/17/16  Yes Loletta Specter, PA-C  aspirin EC 81 MG tablet Take 81 mg by mouth every 6 (six) hours as needed for mild pain.    Yes [provider]  beclomethasone (QVAR) 40 MCG/ACT inhaler Inhale 2 puffs into the lungs 2 (two) times daily.   Yes [provider]  benzonatate (TESSALON) 200 MG capsule Take 1 capsule (200 mg total) by mouth 2 (two) times daily as  needed for cough. 08/17/16  Yes Loletta Specter, PA-C  cetirizine (ZYRTEC) 10 MG tablet Take 1 tablet (10 mg total) by mouth daily. 08/17/16  Yes Loletta Specter, PA-C  docusate sodium (COLACE) 100 MG capsule Take 100 mg by mouth daily.   Yes [provider]  glimepiride (AMARYL) 4 MG tablet Take 1 tablet (4 mg total) by mouth daily before breakfast. 08/17/16  Yes Loletta Specter, PA-C  glucose blood (ACCU-CHEK AVIVA PLUS) test strip Use as instructed 08/17/16  Yes Loletta Specter, PA-C  hydrochlorothiazide (HYDRODIURIL) 25 MG tablet Take 1 tablet (25 mg total) by mouth daily. 08/17/16  Yes Loletta Specter, PA-C  hydrOXYzine (ATARAX/VISTARIL) 10 MG tablet Take 1 tablet (10 mg total) by mouth 3 (three) times daily as needed. Patient taking differently: Take 10 mg by mouth 3 (three) times daily as needed for itching.  08/17/16  Yes Loletta Specter, PA-C  insulin detemir (LEVEMIR) 100 UNIT/ML injection Inject 0.6 mLs (60 Units total) into the skin at bedtime. 09/07/16  Yes Loletta Specter, PA-C  lactulose [facility]) 10 GM/15ML solution Take 10 g by mouth 2 (two) times daily as needed for mild constipation.   Yes [provider]  Lancets (ACCU-CHEK SOFT TOUCH)  lancets Use as instructed 08/17/16  Yes Loletta Specter, PA-C  metFORMIN (GLUCOPHAGE) 1000 MG tablet Take 1 tablet (1,000 mg total) by mouth 2 (two) times daily with a meal. 08/17/16  Yes Loletta Specter, PA-C  metoCLOPramide (REGLAN) 5 MG tablet Take 1 tablet (5 mg total) by mouth 3 (three) times daily before meals. 09/11/16  Yes Loletta Specter, PA-C  naproxen (NAPROSYN) 500 MG tablet Take 1 tablet (500 mg total) by mouth 2 (two) times daily with a meal. 09/07/16  Yes Loletta Specter, PA-C  pravastatin (PRAVACHOL) 10 MG tablet Take 1 tablet (10 mg total) by mouth daily. 08/17/16  Yes Loletta Specter, PA-C  sertraline (ZOLOFT) 100 MG tablet Take 1 tablet (100 mg total) by mouth daily. 08/17/16  Yes Loletta Specter, PA-C  SUMAtriptan (IMITREX) 25 MG tablet Take 1 tablet (25 mg total) by mouth daily. May take one more tablet two hours after the first. No more than two tablets per day. 08/17/16  Yes Loletta Specter, PA-C  topiramate (TOPAMAX) 25 MG tablet Take 1 tablet (25 mg total) by mouth 2 (two) times daily. 08/17/16  Yes Loletta Specter, PA-C  diphenhydrAMINE (BENADRYL) 25 mg capsule Take 1 capsule (25 mg total) by mouth every 4 (four) hours as needed. 09/14/16 09/14/17  Raliegh Ip, DO  predniSONE (DELTASONE) 20 MG tablet Take 2 tablets (40 mg total) by mouth daily. x4 days.  Start 09/15/16. 09/14/16 09/14/17  Raliegh Ip, DO    Family History No family history on file.  Social History Social History  Substance Use Topics  . Smoking status: Current Every Day Smoker    Types: Cigarettes  . Smokeless tobacco: Never Used  . Alcohol use No     Allergies   Patient has no known allergies.   Review of Systems Review of Systems  Constitutional: Negative for activity change, chills, fatigue and fever.  HENT: Positive for trouble swallowing and voice change. Negative for congestion and drooling.   Respiratory: Negative for cough, chest tightness, shortness of breath and wheezing.   Cardiovascular: Negative for chest pain.  Gastrointestinal: Positive for abdominal pain (chronic), nausea (chronic) and vomiting (chronic).  Skin: Negative for rash.  Allergic/Immunologic: Negative for environmental allergies and food allergies.  Neurological: Negative for dizziness, syncope and weakness.     Physical Exam Updated Vital Signs BP 118/88   Pulse 64   Temp 98.3 F (36.8 C) (Oral)   Resp 18   SpO2 96%   Physical Exam  Constitutional: She is oriented to person, place, and time. She appears well-developed and well-nourished. No distress.  HENT:  Head: Normocephalic and atraumatic.  Mouth/Throat: Uvula is midline and mucous membranes are normal. No oropharyngeal exudate.    Mallampati 3 w/ patent airway.  Sublingual edema appreciated.  No perioral rash or lip swelling appreciated.  Cardiovascular: Normal rate, regular rhythm, normal heart sounds and intact distal pulses.   No murmur heard. Pulmonary/Chest: Effort normal and breath sounds normal. She has no wheezes.  Abdominal: Soft. She exhibits no mass. There is tenderness (LUQ and epigratric (per patient being w/u by PCP and not worse)). There is no rebound and no guarding.  obese  Musculoskeletal: Normal range of motion. She exhibits no edema.  Neurological: She is alert and oriented to person, place, and time.  Skin: Skin is warm and dry. Capillary refill takes less than 2 seconds. No rash noted. She is not diaphoretic.  Psychiatric: She has a normal mood  and affect. Her behavior is normal. Judgment and thought content normal.     ED Treatments / Results  Labs (all labs ordered are listed, but only abnormal results are displayed) Labs Reviewed - No data to display  EKG  EKG Interpretation None       Radiology No results found.  Procedures Procedures (including critical care time)  Medications Ordered in ED Medications  sodium chloride 0.9 % 1,000 mL infusion ( Intravenous Restarted 09/14/16 1133)  methylPREDNISolone sodium succinate (SOLU-MEDROL) 125 mg/2 mL injection 125 mg (125 mg Intravenous Given 09/14/16 0907)  diphenhydrAMINE (BENADRYL) injection 25 mg (25 mg Intravenous Given 09/14/16 0908)     Initial Impression / Assessment and Plan / ED Course  I have reviewed the triage vital signs and the nursing notes.  Pertinent labs & imaging results that were available during my care of the patient were reviewed by me and considered in my medical decision making (see chart for details).    0900: Symptoms concerning for possible ACE-I related angioedema.  Mallampati 3.  Patient protecting airway with no evidence of respiratory distress.  VSS. Solumedrol and Benadryl ordered.  [id number]:  Mallampati 3, persistent sublingual edema.  Continues to protect airway with no respiratory distress.  S/p Solumedrol and Benadryl.  SBP noted to be in 90s.  Baseline appears to be around 110's.  Discussed with Pfeiffer.  MIVF ordered.  0945: Sleepy, Mallampati 2-3, no respiratory distress. Easily rouseable.    1015: Sleepy, reports tongue seems to be going down.  No SOB.  BP stable.  Mallampati 2 w/ good reduction in sublingual edema.  1045: Sleepy but easily arouseable.  She notes that tongue swelling feels significantly better.  She denies dysphagia, SOB, foreign body sensation in throat.  Mallampati 2 on exam with reduction of sublingual edema.  Patient reports she has ride home.  1133: recheck.  Patient clarifies and reports that she feels that tongue swelling is improving but she still has a sensation of something in her throat.  She reports she is hungry and would like to eat.  1237: recheck: Much more awake.  Patient tolerated sandwich, crackers, oral fluids and peanut butter without difficulty.  She continues to endorse sensation of something in her throat and thinks that the left side of her tongue seems a little more swollen that the right.  No SOB.  Final Clinical Impressions(s) / ED Diagnoses   Final diagnoses:  Angioedema, initial encounter   Novella Robamela Slingerland is a 46 y.o. female that presents with tongue swelling and feeling of dysphagia.  She did not exhibit signs of anaphylaxis.  Presentation clinically consistent with ACE-I induced angioedema.  She was given IV solumedrol and benadryl to which she responded well.  She was given IVFs for slightly soft BPs.  She tolerated above therapies well.  Lisinopril discontinued.  Discussed this with patient.  Her BPs were on softer side.  Therefore, no replacement antihypertensive prescribed.  This was reviewed with patient, who will follow up with PCP in next week for BP check and medication management.  Upon discharge symptoms had improved  some.  She was tolerating PO without difficulty.  She was discharged in stable condition home w/ strict return precautions.   New Prescriptions Current Discharge Medication List    START taking these medications   Details  diphenhydrAMINE (BENADRYL) 25 mg capsule Take 1 capsule (25 mg total) by mouth every 4 (four) hours as needed. Qty: 12 capsule, Refills: 0    predniSONE (DELTASONE) 20 MG  tablet Take 2 tablets (40 mg total) by mouth daily. x4 days.  Start 09/15/16. Qty: 8 tablet, Refills: 0         Raliegh Ip, DO 09/14/16 1259    Arby Barrette, MD 09/18/16 1150

## 2016-09-14 NOTE — Discharge Instructions (Addendum)
STOP taking your Lisinopril.  Your blood pressure is on the lower side today, so no replacement medication was prescribed.  Please follow up with your Primary care provider within the next week for blood pressure check.  Please call your primary care provider's office to reschedule your ultrasound.  As we discussed, if you develop worsening sensation of swelling in your throat or in your tongue, feel short of breath, or are unable to stay hydrated, please return to ED for evaluation.

## 2016-09-14 NOTE — ED Triage Notes (Signed)
Pt started taking reglan 2 days ago last does yesterday afternoon, pt woke this am with a swollen tongue. Pt has equal clear breath sounds , no rash.

## 2016-09-14 NOTE — Progress Notes (Signed)
CMA read PPD as zero mm.

## 2016-09-18 ENCOUNTER — Ambulatory Visit (HOSPITAL_COMMUNITY)
Admission: RE | Admit: 2016-09-18 | Discharge: 2016-09-18 | Disposition: A | Discharge status: Home / Self Care | Payer: Medicaid Other | Source: Ambulatory Visit | Attending: Physician Assistant | Admitting: Physician Assistant

## 2016-09-18 DIAGNOSIS — R1013 Epigastric pain: Secondary | ICD-10-CM

## 2016-09-18 DIAGNOSIS — R740 Nonspecific elevation of levels of transaminase and lactic acid dehydrogenase [LDH]: Secondary | ICD-10-CM | POA: Diagnosis not present

## 2016-09-18 DIAGNOSIS — Z9049 Acquired absence of other specified parts of digestive tract: Secondary | ICD-10-CM | POA: Diagnosis not present

## 2016-09-18 DIAGNOSIS — R1011 Right upper quadrant pain: Secondary | ICD-10-CM | POA: Diagnosis not present

## 2016-09-18 DIAGNOSIS — R7401 Elevation of levels of liver transaminase levels: Secondary | ICD-10-CM

## 2016-09-18 DIAGNOSIS — K760 Fatty (change of) liver, not elsewhere classified: Secondary | ICD-10-CM | POA: Diagnosis not present

## 2016-09-27 ENCOUNTER — Other Ambulatory Visit (INDEPENDENT_AMBULATORY_CARE_PROVIDER_SITE_OTHER): Payer: Self-pay | Admitting: Physician Assistant

## 2016-09-27 DIAGNOSIS — G43811 Other migraine, intractable, with status migrainosus: Secondary | ICD-10-CM

## 2016-09-27 NOTE — Telephone Encounter (Signed)
FWD to PCP. Tempestt S Roberts, CMA  

## 2016-10-02 ENCOUNTER — Ambulatory Visit (INDEPENDENT_AMBULATORY_CARE_PROVIDER_SITE_OTHER): Payer: Medicaid Other | Admitting: Neurology

## 2016-10-02 ENCOUNTER — Encounter: Payer: Self-pay | Admitting: Neurology

## 2016-10-02 DIAGNOSIS — G43909 Migraine, unspecified, not intractable, without status migrainosus: Secondary | ICD-10-CM | POA: Insufficient documentation

## 2016-10-02 DIAGNOSIS — G43019 Migraine without aura, intractable, without status migrainosus: Secondary | ICD-10-CM

## 2016-10-02 DIAGNOSIS — G43811 Other migraine, intractable, with status migrainosus: Secondary | ICD-10-CM | POA: Diagnosis not present

## 2016-10-02 HISTORY — DX: Migraine without aura, intractable, without status migrainosus: G43.019

## 2016-10-02 MED ORDER — ALPRAZOLAM 0.5 MG PO TABS: ORAL_TABLET | ORAL | 0 refills | Status: DC

## 2016-10-02 MED ORDER — TOPIRAMATE 25 MG PO TABS: ORAL_TABLET | ORAL | 3 refills | Status: DC

## 2016-10-02 MED ORDER — SUMATRIPTAN SUCCINATE 100 MG PO TABS: 100.0000 mg | ORAL_TABLET | Freq: Two times a day (BID) | ORAL | 3 refills | Status: DC | PRN

## 2016-10-02 NOTE — Patient Instructions (Addendum)
   Stop using BC powders, OK to use Tylenol or Advil.   We will go up on the Topamax, call for dose adjustments.    We will check MRI of the brain.  Topamax (topiramate) is a seizure medication that has an FDA approval for seizures and for migraine headache. Potential side effects of this medication include weight loss, cognitive slowing, tingling in the fingers and toes, and carbonated drinks will taste bad. If any significant side effects are noted on this drug, please contact our office.

## 2016-10-02 NOTE — Progress Notes (Signed)
Reason for visit: Migraine headache  Referring physician: Dr. Janith Lima is a 46 y.o. female  History of present illness:  Ms. Givhan is a 46 year old right-handed black female with a history of migraine headaches that have been present over the last 2 years. The onset of the headaches apparently correlated with loss of her son. The patient indicates that she had increased stress around that time that has continued. The patient has been incarcerated in the recent past as well. She is applying for disability for multiple issues including her diabetes and degenerative arthritis and headaches. The patient indicates that her headaches are daily in nature, she cannot remember the last day without a headache. The headaches are in the temporal regions bilaterally and may be associated with some nausea and vomiting, photophobia and phonophobia. The patient denies any neck stiffness. The patient has tenderness of the scalp as well. The headaches are not associated with any numbness or weakness of the face, arms, or legs. The patient does report some shortness of breath and fatigue. She may occasionally have some white spots in the vision. She has sleep apnea as well. The patient denies any family history of headache. She takes BC powders on a frequent basis, 3-4 packets every other day. The patient may also take Tylenol. She was put on low-dose Topamax taking 25 mg twice daily and low-dose Imitrex 25 mg daily which has not been extremely helpful. The patient is sent to this office for an evaluation. She denies any allergy symptoms.  Past Medical History:  Diagnosis Date  . Anxiety   . Depression   . Diabetes mellitus   . Hypertension     Past Surgical History:  Procedure Laterality Date  . BTL      History reviewed. No pertinent family history.  Social history:  reports that she has been smoking Cigarettes.  She has been smoking about 0.24 packs per day. She has never used smokeless  tobacco. She reports that she does not drink alcohol or use drugs.  Medications:  Prior to Admission medications   Medication Sig Start Date End Date Taking? Authorizing Provider  albuterol (PROVENTIL HFA;VENTOLIN HFA) 108 (90 Base) MCG/ACT inhaler Inhale 1-2 puffs into the lungs every 6 (six) hours as needed for wheezing or shortness of breath. 08/17/16  Yes Loletta Specter, PA-C  aspirin EC 81 MG tablet Take 81 mg by mouth every 6 (six) hours as needed for mild pain.    Yes [provider]  beclomethasone (QVAR) 40 MCG/ACT inhaler Inhale 2 puffs into the lungs 2 (two) times daily.   Yes [provider]  benzonatate (TESSALON) 200 MG capsule Take 1 capsule (200 mg total) by mouth 2 (two) times daily as needed for cough. 08/17/16  Yes Loletta Specter, PA-C  cetirizine (ZYRTEC) 10 MG tablet Take 1 tablet (10 mg total) by mouth daily. 08/17/16  Yes Loletta Specter, PA-C  diphenhydrAMINE (BENADRYL) 25 mg capsule Take 1 capsule (25 mg total) by mouth every 4 (four) hours as needed. 09/14/16 09/14/17 Yes Gottschalk, Kathie Rhodes M, DO  docusate sodium (COLACE) 100 MG capsule Take 100 mg by mouth daily.   Yes [provider]  glimepiride (AMARYL) 4 MG tablet Take 1 tablet (4 mg total) by mouth daily before breakfast. 08/17/16  Yes Loletta Specter, PA-C  glucose blood (ACCU-CHEK AVIVA PLUS) test strip Use as instructed 08/17/16  Yes Loletta Specter, PA-C  hydrochlorothiazide (HYDRODIURIL) 25 MG tablet Take 1 tablet (  25 mg total) by mouth daily. 08/17/16  Yes Loletta Specter, PA-C  hydrOXYzine (ATARAX/VISTARIL) 10 MG tablet Take 1 tablet (10 mg total) by mouth 3 (three) times daily as needed. Patient taking differently: Take 10 mg by mouth 3 (three) times daily as needed for itching.  08/17/16  Yes Loletta Specter, PA-C  insulin detemir (LEVEMIR) 100 UNIT/ML injection Inject 0.6 mLs (60 Units total) into the skin at bedtime. 09/07/16  Yes Loletta Specter, PA-C  lactulose  [facility]) 10 GM/15ML solution Take 10 g by mouth 2 (two) times daily as needed for mild constipation.   Yes [provider]  Lancets (ACCU-CHEK SOFT TOUCH) lancets Use as instructed 08/17/16  Yes Loletta Specter, PA-C  metFORMIN (GLUCOPHAGE) 1000 MG tablet Take 1 tablet (1,000 mg total) by mouth 2 (two) times daily with a meal. 08/17/16  Yes Loletta Specter, PA-C  metoCLOPramide (REGLAN) 5 MG tablet Take 1 tablet (5 mg total) by mouth 3 (three) times daily before meals. 09/11/16  Yes Loletta Specter, PA-C  naproxen (NAPROSYN) 500 MG tablet Take 1 tablet (500 mg total) by mouth 2 (two) times daily with a meal. 09/07/16  Yes Loletta Specter, PA-C  pravastatin (PRAVACHOL) 10 MG tablet Take 1 tablet (10 mg total) by mouth daily. 08/17/16  Yes Loletta Specter, PA-C  predniSONE (DELTASONE) 20 MG tablet Take 2 tablets (40 mg total) by mouth daily. x4 days.  Start 09/15/16. 09/14/16 09/14/17 Yes Gottschalk, Ashly M, DO  sertraline (ZOLOFT) 100 MG tablet Take 1 tablet (100 mg total) by mouth daily. 08/17/16  Yes Loletta Specter, PA-C  SUMAtriptan (IMITREX) 25 MG tablet Take 1 tablet (25 mg total) by mouth daily. May take one more tablet two hours after the first. No more than two tablets per day. 09/27/16  Yes Loletta Specter, PA-C  topiramate (TOPAMAX) 25 MG tablet Take 1 tablet (25 mg total) by mouth 2 (two) times daily. 08/17/16  Yes Loletta Specter, PA-C     No Known Allergies  ROS:  Out of a complete 14 system review of symptoms, the patient complains only of the following symptoms, and all other reviewed systems are negative.  Weight loss Chest pain Blurred vision, eye pain Shortness of breath, cough, wheezing, snoring Constipation Swollen lymph nodes Feeling hot, cold, increased thirst, flushing Joint pain, achy muscles Headache, dizziness Depression, anxiety, too much sleep, decreased energy, change in appetite, disinterest in activities Restless legs   Blood  pressure 110/78, pulse 71, height 5' 5.5" (1.664 m), weight 246 lb (111.6 kg), SpO2 94 %.  Physical Exam  General: The patient is alert and cooperative at the time of the examination. The patient is markedly obese.  Eyes: Pupils are equal, round, and reactive to light. Discs are flat bilaterally.  Neck: The neck is supple, no carotid bruits are noted.  Respiratory: The respiratory examination is clear.  Cardiovascular: The cardiovascular examination reveals a regular rate and rhythm, no obvious murmurs or rubs are noted.  Neuromuscular: Range of movement of the cervical spine is full. No crepitus is noted in the temporomandibular joints.  Skin: Extremities are without significant edema.  Neurologic Exam  Mental status: The patient is alert and oriented x 3 at the time of the examination. The patient has apparent normal recent and remote memory, with an apparently normal attention span and concentration ability.  Cranial nerves: Facial symmetry is present. There is good sensation of the face to pinprick and soft touch bilaterally.  The strength of the facial muscles and the muscles to head turning and shoulder shrug are normal bilaterally. Speech is well enunciated, no aphasia or dysarthria is noted. Extraocular movements are full. Visual fields are full. The tongue is midline, and the patient has symmetric elevation of the soft palate. No obvious hearing deficits are noted.  Motor: The motor testing reveals 5 over 5 strength of all 4 extremities. Good symmetric motor tone is noted throughout.  Sensory: Sensory testing is intact to pinprick, soft touch, vibration sensation, and position sense on all 4 extremities. No evidence of extinction is noted.  Coordination: Cerebellar testing reveals good finger-nose-finger and heel-to-shin bilaterally.  Gait and station: Gait is normal. Tandem gait is normal. Romberg is negative. No drift is seen.  Reflexes: Deep tendon reflexes are symmetric  and normal bilaterally in the arms, symmetric but decreased in the legs. Toes are downgoing bilaterally.   Assessment/Plan:  1. Common migraine headache, intractable  The patient is having daily headaches. She will be increased on Topamax taking 75 mg night for a week and then go to 100 mg at night. If this is not effective, she will call for dose adjustments. The Imitrex will be increased to 100 mg tablets taking 1 twice daily needed. The patient will stop using BC powders as they have quite a bit of caffeine, she does not use a lot of coffee or tea or soft drinks during the week. The patient may use Tylenol or Advil. She will follow-up in 3 months, sooner if needed.  Marlan Palau. Keith Ricardo Schubach MD 10/02/2016 10:00 AM  [facility] [address] [place], [place] [id number]  Phone [phone] Fax [phone]

## 2016-10-06 ENCOUNTER — Ambulatory Visit (INDEPENDENT_AMBULATORY_CARE_PROVIDER_SITE_OTHER): Payer: Medicaid Other | Admitting: Physician Assistant

## 2016-10-06 ENCOUNTER — Encounter (INDEPENDENT_AMBULATORY_CARE_PROVIDER_SITE_OTHER): Payer: Self-pay | Admitting: Physician Assistant

## 2016-10-06 VITALS — BP 106/74 | HR 73 | Temp 98.3°F | Wt 244.0 lb

## 2016-10-06 DIAGNOSIS — F4323 Adjustment disorder with mixed anxiety and depressed mood: Secondary | ICD-10-CM

## 2016-10-06 DIAGNOSIS — K5900 Constipation, unspecified: Secondary | ICD-10-CM

## 2016-10-06 MED ORDER — LACTULOSE 10 GM/15ML PO SOLN: 20.0000 g | Freq: Two times a day (BID) | ORAL | 3 refills | Status: DC | PRN

## 2016-10-06 NOTE — Patient Instructions (Signed)
Community Resources  Advocacy/Legal Legal Aid [place]:  1-866-219-5262  /  336-272-0148  Family Justice Center:  336-641-7233  [facility] 24-hr Crisis line:  336-273-7273  Women's Resource Center, [place]:  336-275-6090  Court Watch (custody):  336-275-2346  [facility]:   336-279-9299    Baby & Breastfeeding Car Seat Inspection @ Various [place] Fire Depts.- call 336-373-2177  [facility]  336-832-6860  [facility] 336-878-6712  WIC: 336-641-3663 ([place]);  336-641-7571 ([place])  La Leche League:  1-877-452-5321   Childcare [place] [facility]: 336-369-5097 ([place]) / 336-887-8224 ([place])  - Child Care Resources/ Referrals/ Scholarships  - Head Start/ Early Head Start (call or apply online)  [place] DHHS: [place] Pre-K :  1-800-859-0829 / 336-274-5437   Employment / Job Search [facility] [place]: 336-275-6090 / [address]  [place] [facility] [employer] [facility] ([employer]Link): 336-373-5922 ([place]) / 336-882-4141 ([place])  [facility]/ Career Center: 336-275-9801 / 336-282-7307  [facility] Job [facility] Career [facility]: 336-373-3764  DHHS Work First: 336-641-3447 ([place]) / 336-641-3447 ([place])  [facility] [place]:  336-676-5871   Financial Assistance [facility]:  336-553-2657  [employer]: 336-235-0368  [employer] (furniture):  336-370-4002  [place] [facility]: 336-373-4264  Low Income Energy Assistance  336-641-3000   Food Assistance DHHS- SNAP/ Food Stamps: 336-641-4588  WIC: [place]- 336-641-3663 ;  [place] 336-641-7571  Little Green Book- Free Meals  Little Blue Book- Free Food Pantries  During the summer, text "FOOD" to 877877   General Health / Clinics (Adults) Orange Card (for Adults) through [facility]: (336) 895-4900  [facility]:   336-832-8035  [facility]:   336-832-4444  Health Department:  336-641-3245  [facility]:  336-415-3877 / 336-641-2100  [facility] [place]:   336-373-0678  [facility]:   336-334-4822 x 50251   Housing [place] [facility]:   336-691-9521  [place] [facility] Authority:  336-275-8501  Affordable Housing Managemnt:  336-273-0568   Immigrant/ Refugee Center for [place]ians ([employer]CG):  336-256-1065  Fa[facility]:  336-379-0037  [facility] [employer] [facility]:  336-937-4701  [employer] World Services:  336-617-0381  African Services Coalition:  336-574-2677   LGBTQ YouthSAFE  www.youthsafe[place].org  PFLAG  336-541-6754 / info@[place].org  The Trevor Project:  1-866-488-7386   Mental Health/ Substance Use [facility]  336-387-6161  [facility]:  336-832-9700 or 1-800-711-2635  [facility] of Care:  336-271-5888  [facility]:  336-294-1349  [facility]:  336-542-2884  [facility] (walk-ins)  336-676-6840 / [address]  [place]:  800-449-1287  Alcoholics Anonymous:  336-854-4278  Narcotics Anonymous:  800-365-1036  Quit Smoking Hotline:  800-QUIT-NOW (800-784-8669)   Parenting Children's Home Society:  800-632-1400  [place]: Education Center & Support Groups:  336-832-6682  YWCA: 336-273-3461  [employer]: Bringing Out the Best:  336-334-3120               Thriving at Three (Hispanic families): 336-256-1066  Healthy Start ([facility]):  336-387-6161 x2288  Parents as Teachers:  336-691-0024  [place] [facility]- Learning Together (Immigrants): 336-369-5001   Poison Control 800-222-1222  Sports & Recreation [employer] Open Doors Application: ymcanwnc.org/join/open-doors-financial-assistance/  City of [place] Recreation Centers: http://www.[place].gov/index.aspx?page=3615   Special Needs Family Support Network:  336-832-6507  Autism Society of :   336-333-0197 x1402 or x1412 /  800-785-1035  [facility] [place]:  336-334-5773     ARC of [place]:  (219) 023-3484(952)041-9484  Children's Developmental Service Agency (CDSA):  (804) 288-0859(769)463-4106  [facility] (Care Coordination for Children):  224-086-2944651-792-0346   Transportation Medicaid Transportation: [phone] to apply  [place] [facility]: 920 [phone] (reduced-fare bus ID to Medicaid/ Medicare/ Orange Card)  SCAT Paratransit services: Eligible riders only, call [phone] for application   Tutoring/Mentoring [facility] Child Development Institute: (563)527-2767  Big Brothers/ Big Sisters: (475)433-5184717-356-1918 Manley Mason([place])  680-691-6756838-693-2910 ([place])  ACES through child's school: 314-018-7578  [employer] Achievers: contact your local Y  SHIELD Mentor Program: 3515261334(810)044-8291      Adjustment Disorder, Adult Adjustment disorder is a group of symptoms that can develop after a stressful life event, such as the loss of a job or serious physical illness. The symptoms can affect how you feel, think, and act. They may interfere with your relationships. Adjustment disorder increases your risk of suicide and substance abuse. If this disorder is not managed early, it can develop into a more serious condition, such as major depressive disorder or post-traumatic stress disorder. What are the causes? This condition happens when you have trouble recovering from or coping with a stressful life event. What increases the risk? You are more likely to develop this condition if:  You have had depression or anxiety.  You are being treated for a long-term (chronic) illness.  You are being treated for an illness that cannot be cured (terminal illness).  You have a family history of mental illness.  What are the signs or symptoms? Symptoms of this condition include:  Extreme trouble doing daily tasks, such as going to work.  Sadness, depression, or crying spells.  Worrying a lot.  Loss of enjoyment.  Change in appetite or weight.  Feelings of loss or hopelessness.  Thoughts of suicide.  Anxiety, worry, or  nervousness.  Trouble sleeping.  Avoiding family and friends.  Fighting or vandalism.  Complaining of feeling sick without being ill.  Feeling dazed or disconnected.  Nightmares.  Trouble sleeping.  Irritability.  Reckless driving.  Poor work International aid/development workerperformance.  Ignoring bills.  Symptoms of this condition start within three months of the stressful event. They do not last more than six months, unless the stressful circumstances last longer. Normal grieving after the death of a loved one is not a symptom of this condition. How is this diagnosed? To diagnose this condition, your health care provider will ask about what has happened in your life and how it has affected you. He or she may also ask about your medical history and your use of medicines, alcohol, and other substances. Your health care provider may do a physical exam and order lab tests or other studies. You may be referred to a mental health specialist. How is this treated? Treatment options for this condition include:  Counseling or talk therapy. Talk therapy is usually provided by mental health specialists.  Medicines. Certain medicines may help with depression, anxiety, and sleep.  Support groups. These offer emotional support, advice, and guidance. They are made up of people who have had similar experiences.  Observation and time. This is sometimes called "watchful waiting." In this treatment, health care providers monitor your health and behavior without other treatment. Adjustment disorder sometimes gets better on its own with time.  Follow these instructions at home:  Take over-the-counter and prescription medicines only as told by your health care provider.  Keep all follow-up visits as told by your health care provider. This is important. Contact a health care provider if:  Your symptoms do not improve in six  Your symptoms get worse. Get help right away if:  You have serious thoughts about hurting  yourself or someone else. If you ever feel like you may hurt yourself or others, or have thoughts about taking your own life, get help right away. You can go to your nearest emergency department or call:  Your local emergency services (911 in the [place]  A suicide crisis helpline, such as the National Suicide Prevention Lifeline at 1-800-273-8255. This is open 24 hours a day.  Summary  Adjustment disorder is a group of symptoms that can develop after a stressful life event, such as the loss of a job or serious physical illness. The symptoms can affect how you feel, think, and act. They may interfere with your relationships.  Symptoms of this condition start within three months of the stressful event. They do not last more than six months, unless the stressful circumstances last longer.  Treatment may include talk therapy, medicines, participation in a support group, or observation to see if symptoms improve.  Contact your health care provider if your symptoms get worse or do not improve in six months.  If you ever feel like you may hurt yourself or others, or have thoughts about taking your own life, get help right away. This information is not intended to replace advice given to you by your health care provider. Make sure you discuss any questions you have with your health care provider. Document Released: 11/29/2005 Document Revised: 05/26/2016 Document Reviewed: 05/26/2016 Elsevier Interactive Patient Education  2018 Elsevier Inc.  

## 2016-10-06 NOTE — Progress Notes (Signed)
Subjective:  Patient ID: [name], female    DOB: 1971-01-24  Age: 46 y.o. MRN: [id number]  CC: f/u headache  HPI [name] is a 46 y.o. female with a PMH of anxiety, depression, DM2, and HTN presents for f/u of headache. Had a neurology appointment for her headache on 10/02/16. She was diagnosed with Common migraine headache, intractable. She was prescribed Topamax 75mg  for a week and then 100mg  qhs. Also 100mg  tablets of Imitrex up to twice per day. Patient says the increase is helping somewhat. Headaches are triggered by stress. Current stressors include problems with her 59 year old daughter with a rebellious attitude. Two nephews were shot here in [place] in one week. Also has a history of her son death due to a shooting. Feels depressed and would like information to speak to a psychologist.    Outpatient Medications Prior to Visit  Medication Sig Dispense Refill  . albuterol (PROVENTIL HFA;VENTOLIN HFA) 108 (90 Base) MCG/ACT inhaler Inhale 1-2 puffs into the lungs every 6 (six) hours as needed for wheezing or shortness of breath. 1 Inhaler 5  . ALPRAZolam (XANAX) 0.5 MG tablet Take 2 tablets approximately 45 minutes prior to the MRI study, take a third tablet if needed. 3 tablet 0  . aspirin EC 81 MG tablet Take 81 mg by mouth every 6 (six) hours as needed for mild pain.     . beclomethasone (QVAR) 40 MCG/ACT inhaler Inhale 2 puffs into the lungs 2 (two) times daily.    . benzonatate (TESSALON) 200 MG capsule Take 1 capsule (200 mg total) by mouth 2 (two) times daily as needed for cough. 20 capsule 0  . cetirizine (ZYRTEC) 10 MG tablet Take 1 tablet (10 mg total) by mouth daily. 30 tablet 2  . diphenhydrAMINE (BENADRYL) 25 mg capsule Take 1 capsule (25 mg total) by mouth every 4 (four) hours as needed. 12 capsule 0  . docusate sodium (COLACE) 100 MG capsule Take 100 mg by mouth daily.    Marland Kitchen glimepiride (AMARYL) 4 MG tablet Take 1 tablet (4 mg total) by mouth daily before  breakfast. 90 tablet 1  . glucose blood (ACCU-CHEK AVIVA PLUS) test strip Use as instructed 100 each 12  . hydrochlorothiazide (HYDRODIURIL) 25 MG tablet Take 1 tablet (25 mg total) by mouth daily. 90 tablet 1  . hydrOXYzine (ATARAX/VISTARIL) 10 MG tablet Take 1 tablet (10 mg total) by mouth 3 (three) times daily as needed. (Patient taking differently: Take 10 mg by mouth 3 (three) times daily as needed for itching. ) 30 tablet 0  . insulin detemir (LEVEMIR) 100 UNIT/ML injection Inject 0.6 mLs (60 Units total) into the skin at bedtime. 10 mL 11  . Lancets (ACCU-CHEK SOFT TOUCH) lancets Use as instructed 100 each 12  . metFORMIN (GLUCOPHAGE) 1000 MG tablet Take 1 tablet (1,000 mg total) by mouth 2 (two) times daily with a meal. 180 tablet 1  . metoCLOPramide (REGLAN) 5 MG tablet Take 1 tablet (5 mg total) by mouth 3 (three) times daily before meals. 90 tablet 1  . naproxen (NAPROSYN) 500 MG tablet Take 1 tablet (500 mg total) by mouth 2 (two) times daily with a meal. 14 tablet 0  . pravastatin (PRAVACHOL) 10 MG tablet Take 1 tablet (10 mg total) by mouth daily. 90 tablet 1  . sertraline (ZOLOFT) 100 MG tablet Take 1 tablet (100 mg total) by mouth daily. 30 tablet 3  . SUMAtriptan (IMITREX) 100 MG tablet Take 1 tablet (100 mg total)  by mouth 2 (two) times daily as needed for migraine. 10 tablet 3  . topiramate (TOPAMAX) 25 MG tablet 3 tablets at night for one week then take 4 tablets at night 120 tablet 3  . lactulose (CHRONULAC) 10 GM/15ML solution Take 10 g by mouth 2 (two) times daily as needed for mild constipation.     No facility-administered medications prior to visit.      ROS Review of Systems  Constitutional: Negative for chills, fever and malaise/fatigue.  Eyes: Negative for blurred vision.  Respiratory: Negative for shortness of breath.   Cardiovascular: Negative for chest pain and palpitations.  Gastrointestinal: Negative for abdominal pain and nausea.  Genitourinary: Negative  for dysuria and hematuria.  Musculoskeletal: Negative for joint pain and myalgias.  Skin: Negative for rash.  Neurological: Positive for headaches. Negative for tingling.  Psychiatric/Behavioral: Positive for depression. The patient is nervous/anxious.     Objective:  BP 106/74 (BP Location: Right Arm, Patient Position: Sitting, Cuff Size: Large)   Pulse 73   Temp 98.3 F (36.8 C) (Oral)   Wt 244 lb (110.7 kg)   LMP 08/23/2016 (Approximate)   SpO2 96%   BMI 39.99 kg/m   BP/Weight 10/06/2016 10/02/2016 09/14/2016  Systolic BP 106 110 109  Diastolic BP 74 78 71  Wt. (Lbs) 244 246 -  BMI 39.99 40.31 -      Physical Exam  Constitutional: She is oriented to person, place, and time.  Well developed, well nourished, NAD, polite  HENT:  Head: Normocephalic and atraumatic.  Cardiovascular: Normal rate, regular rhythm and normal heart sounds.   Pulmonary/Chest: Effort normal and breath sounds normal.  Musculoskeletal: She exhibits no edema.  Neurological: She is alert and oriented to person, place, and time.  Skin: Skin is warm and dry. No rash noted. No erythema. No pallor.  Psychiatric: Her behavior is normal. Thought content normal.  Depressed, tearful  Vitals reviewed.    Assessment & Plan:   1. Adjustment disorder with mixed anxiety and depressed mood - Ambulatory referral to Psychiatry - Continue Zoloft 100mg  and Xanax 0.5mg   2. Constipation, unspecified constipation type - Refill lactulose (CHRONULAC) 10 GM/15ML solution; Take 30 mLs (20 g total) by mouth 2 (two) times daily as needed for mild constipation.  Dispense: 236 mL; Refill: 3   Meds ordered this encounter  Medications  . lactulose (CHRONULAC) 10 GM/15ML solution    Sig: Take 30 mLs (20 g total) by mouth 2 (two) times daily as needed for mild constipation.    Dispense:  236 mL    Refill:  3    Order Specific Question:   Supervising Provider    Answer:   Quentin AngstJEGEDE, OLUGBEMIGA E [id number]]    Follow-up:  Return if symptoms worsen or fail to improve.   Loletta Specteroger David Yaeko Fazekas PA

## 2016-10-14 ENCOUNTER — Inpatient Hospital Stay: Admission: RE | Admit: 2016-10-14 | Payer: Medicaid Other | Source: Ambulatory Visit

## 2016-10-28 ENCOUNTER — Other Ambulatory Visit: Payer: Medicaid Other

## 2016-11-03 ENCOUNTER — Other Ambulatory Visit (INDEPENDENT_AMBULATORY_CARE_PROVIDER_SITE_OTHER): Payer: Self-pay | Admitting: Physician Assistant

## 2016-11-03 DIAGNOSIS — J301 Allergic rhinitis due to pollen: Secondary | ICD-10-CM

## 2016-11-03 DIAGNOSIS — E1143 Type 2 diabetes mellitus with diabetic autonomic (poly)neuropathy: Secondary | ICD-10-CM

## 2016-11-03 DIAGNOSIS — K3184 Gastroparesis: Secondary | ICD-10-CM

## 2016-11-03 DIAGNOSIS — Z760 Encounter for issue of repeat prescription: Secondary | ICD-10-CM

## 2016-11-03 NOTE — Telephone Encounter (Signed)
FWD to PCP. Tempestt S Roberts, CMA  

## 2016-12-13 ENCOUNTER — Other Ambulatory Visit (INDEPENDENT_AMBULATORY_CARE_PROVIDER_SITE_OTHER): Payer: Self-pay | Admitting: Physician Assistant

## 2016-12-13 DIAGNOSIS — F32A Depression, unspecified: Secondary | ICD-10-CM

## 2016-12-13 DIAGNOSIS — F329 Major depressive disorder, single episode, unspecified: Secondary | ICD-10-CM

## 2016-12-15 NOTE — Telephone Encounter (Signed)
Refill for Sertraline request

## 2017-01-03 ENCOUNTER — Ambulatory Visit: Payer: Medicaid Other | Admitting: Adult Health

## 2017-01-04 ENCOUNTER — Encounter: Payer: Self-pay | Admitting: Adult Health

## 2017-01-08 ENCOUNTER — Ambulatory Visit (INDEPENDENT_AMBULATORY_CARE_PROVIDER_SITE_OTHER): Payer: Self-pay | Admitting: Physician Assistant

## 2017-01-11 ENCOUNTER — Ambulatory Visit (INDEPENDENT_AMBULATORY_CARE_PROVIDER_SITE_OTHER): Payer: Medicaid Other | Admitting: Physician Assistant

## 2017-01-11 ENCOUNTER — Encounter (INDEPENDENT_AMBULATORY_CARE_PROVIDER_SITE_OTHER): Payer: Self-pay | Admitting: Physician Assistant

## 2017-01-11 VITALS — BP 126/83 | HR 78 | Temp 98.1°F | Wt 241.2 lb

## 2017-01-11 DIAGNOSIS — R350 Frequency of micturition: Secondary | ICD-10-CM | POA: Diagnosis not present

## 2017-01-11 DIAGNOSIS — R232 Flushing: Secondary | ICD-10-CM | POA: Diagnosis not present

## 2017-01-11 DIAGNOSIS — N3000 Acute cystitis without hematuria: Secondary | ICD-10-CM | POA: Diagnosis not present

## 2017-01-11 DIAGNOSIS — G43811 Other migraine, intractable, with status migrainosus: Secondary | ICD-10-CM | POA: Diagnosis not present

## 2017-01-11 DIAGNOSIS — E119 Type 2 diabetes mellitus without complications: Secondary | ICD-10-CM

## 2017-01-11 DIAGNOSIS — E785 Hyperlipidemia, unspecified: Secondary | ICD-10-CM

## 2017-01-11 LAB — POCT URINALYSIS DIPSTICK
BILIRUBIN UA: NEGATIVE
Glucose, UA: NEGATIVE
KETONES UA: NEGATIVE
NITRITE UA: NEGATIVE
PROTEIN UA: 100
Spec Grav, UA: 1.025 (ref 1.010–1.025)
Urobilinogen, UA: 0.2 E.U./dL
pH, UA: 5.5 (ref 5.0–8.0)

## 2017-01-11 LAB — POCT GLYCOSYLATED HEMOGLOBIN (HGB A1C): Hemoglobin A1C: 10.7

## 2017-01-11 MED ORDER — METFORMIN HCL 1000 MG PO TABS: 1000.0000 mg | ORAL_TABLET | Freq: Two times a day (BID) | ORAL | 3 refills | Status: DC

## 2017-01-11 MED ORDER — INSULIN ASPART 100 UNIT/ML ~~LOC~~ SOLN
10.0000 [IU] | Freq: Three times a day (TID) | SUBCUTANEOUS | 11 refills | Status: DC
Start: 2017-01-11 — End: 2018-01-02

## 2017-01-11 MED ORDER — PRAVASTATIN SODIUM 10 MG PO TABS: 10.0000 mg | ORAL_TABLET | Freq: Every day | ORAL | 1 refills | Status: DC

## 2017-01-11 MED ORDER — CIPROFLOXACIN HCL 500 MG PO TABS: 500.0000 mg | ORAL_TABLET | Freq: Two times a day (BID) | ORAL | 0 refills | Status: AC

## 2017-01-11 MED ORDER — SUMATRIPTAN SUCCINATE 100 MG PO TABS: 100.0000 mg | ORAL_TABLET | Freq: Two times a day (BID) | ORAL | 3 refills | Status: DC | PRN

## 2017-01-11 MED ORDER — GLIMEPIRIDE 4 MG PO TABS: 4.0000 mg | ORAL_TABLET | Freq: Every day | ORAL | 1 refills | Status: DC

## 2017-01-11 MED ORDER — CITALOPRAM HYDROBROMIDE 40 MG PO TABS: 40.0000 mg | ORAL_TABLET | Freq: Every day | ORAL | 3 refills | Status: DC

## 2017-01-11 MED ORDER — ACCU-CHEK SOFT TOUCH LANCETS MISC: 12 refills | Status: DC

## 2017-01-11 MED ORDER — TOPIRAMATE 25 MG PO TABS: ORAL_TABLET | ORAL | 3 refills | Status: DC

## 2017-01-11 MED ORDER — INSULIN DETEMIR 100 UNIT/ML ~~LOC~~ SOLN: 50.0000 [IU] | Freq: Two times a day (BID) | SUBCUTANEOUS | 11 refills | Status: DC

## 2017-01-11 NOTE — Patient Instructions (Signed)
Take Novolog as follows:  If sugar 150-200 take 2 units If sugar 201-251 take 4 units If sugar 251-300 take 6 units If sugar 301-350 take 8 units If sugar 351-400 take 10 units    Diabetes Mellitus and Exercise Exercising regularly is important for your overall health, especially when you have diabetes (diabetes mellitus). Exercising is not only about losing weight. It has many health benefits, such as increasing muscle strength and bone density and reducing body fat and stress. This leads to improved fitness, flexibility, and endurance, all of which result in better overall health. Exercise has additional benefits for people with diabetes, including:  Reducing appetite.  Helping to lower and control blood glucose.  Lowering blood pressure.  Helping to control amounts of fatty substances (lipids) in the blood, such as cholesterol and triglycerides.  Helping the body to respond better to insulin (improving insulin sensitivity).  Reducing how much insulin the body needs.  Decreasing the risk for heart disease by: ? Lowering cholesterol and triglyceride levels. ? Increasing the levels of good cholesterol. ? Lowering blood glucose levels.  What is my activity plan? Your health care provider or certified diabetes educator can help you make a plan for the type and frequency of exercise (activity plan) that works for you. Make sure that you:  Do at least 150 minutes of moderate-intensity or vigorous-intensity exercise each week. This could be brisk walking, biking, or water aerobics. ? Do stretching and strength exercises, such as yoga or weightlifting, at least 2 times a week. ? Spread out your activity over at least 3 days of the week.  Get some form of physical activity every day. ? Do not go more than 2 days in a row without some kind of physical activity. ? Avoid being inactive for more than 90 minutes at a time. Take frequent breaks to walk or stretch.  Choose a type of  exercise or activity that you enjoy, and set realistic goals.  Start slowly, and gradually increase the intensity of your exercise over time.  What do I need to know about managing my diabetes?  Check your blood glucose before and after exercising. ? If your blood glucose is higher than 240 mg/dL (16.1 mmol/L) before you exercise, check your urine for ketones. If you have ketones in your urine, do not exercise until your blood glucose returns to normal.  Know the symptoms of low blood glucose (hypoglycemia) and how to treat it. Your risk for hypoglycemia increases during and after exercise. Common symptoms of hypoglycemia can include: ? Hunger. ? Anxiety. ? Sweating and feeling clammy. ? Confusion. ? Dizziness or feeling light-headed. ? Increased heart rate or palpitations. ? Blurry vision. ? Tingling or numbness around the mouth, lips, or tongue. ? Tremors or shakes. ? Irritability.  Keep a rapid-acting carbohydrate snack available before, during, and after exercise to help prevent or treat hypoglycemia.  Avoid injecting insulin into areas of the body that are going to be exercised. For example, avoid injecting insulin into: ? The arms, when playing tennis. ? The legs, when jogging.  Keep records of your exercise habits. Doing this can help you and your health care provider adjust your diabetes management plan as needed. Write down: ? Food that you eat before and after you exercise. ? Blood glucose levels before and after you exercise. ? The type and amount of exercise you have done. ? When your insulin is expected to peak, if you use insulin. Avoid exercising at times when your insulin  is peaking.  When you start a new exercise or activity, work with your health care provider to make sure the activity is safe for you, and to adjust your insulin, medicines, or food intake as needed.  Drink plenty of water while you exercise to prevent dehydration or heat stroke. Drink enough fluid  to keep your urine clear or pale yellow. This information is not intended to replace advice given to you by your health care provider. Make sure you discuss any questions you have with your health care provider. Document Released: 06/17/2003 Document Revised: 10/15/2015 Document Reviewed: 09/06/2015 Elsevier Interactive Patient Education  2018 [employer].

## 2017-01-11 NOTE — Progress Notes (Signed)
Subjective:  Patient ID: [name], female    DOB: May 10, 1970  Age: 46 y.o. MRN: [id number]  CC: possible UTI  HPI [name] is a 46 y.o. female with a PMH of anxiety, depression, DM2, and HTN presents with concern for UTI. Has dysuria and increased urinary frequency. Does not endorse back pain, flank pain, fever, chills, nausea, or vomiting.     Also here to f/u on DM. A1c 10.7% in clinic today, previous 11.1% in May 2018. Taking all medications as directed. No particular diet is followed but tries to cut down on sweets. No regular exercise. Endorses fatigue, polyuria, polydipsia. No other complaint or symptoms.    Outpatient Medications Prior to Visit  Medication Sig Dispense Refill  . albuterol (PROVENTIL HFA;VENTOLIN HFA) 108 (90 Base) MCG/ACT inhaler Inhale 1-2 puffs into the lungs every 6 (six) hours as needed for wheezing or shortness of breath. 1 Inhaler 5  . aspirin EC 81 MG tablet Take 81 mg by mouth every 6 (six) hours as needed for mild pain.     . beclomethasone (QVAR) 40 MCG/ACT inhaler Inhale 2 puffs into the lungs 2 (two) times daily.    . cetirizine (ZYRTEC) 10 MG tablet Take 1 tablet (10 mg total) by mouth daily. 30 tablet 0  . glimepiride (AMARYL) 4 MG tablet Take 1 tablet (4 mg total) by mouth daily before breakfast. 90 tablet 1  . glucose blood (ACCU-CHEK AVIVA PLUS) test strip Use as instructed 100 each 12  . hydrochlorothiazide (HYDRODIURIL) 25 MG tablet Take 1 tablet (25 mg total) by mouth daily. 90 tablet 1  . hydrOXYzine (ATARAX/VISTARIL) 10 MG tablet Take 1 tablet (10 mg total) by mouth 3 (three) times daily as needed. (Patient taking differently: Take 10 mg by mouth 3 (three) times daily as needed for itching. ) 30 tablet 0  . insulin detemir (LEVEMIR) 100 UNIT/ML injection Inject 0.6 mLs (60 Units total) into the skin at bedtime. 10 mL 11  . lactulose (CHRONULAC) 10 GM/15ML solution Take 30 mLs (20 g total) by mouth 2 (two) times daily as needed for mild  constipation. 236 mL 3  . Lancets (ACCU-CHEK SOFT TOUCH) lancets Use as instructed 100 each 12  . metFORMIN (GLUCOPHAGE) 1000 MG tablet Take 1 tablet (1,000 mg total) by mouth 2 (two) times daily with a meal. 180 tablet 1  . metoCLOPramide (REGLAN) 5 MG tablet Take 1 tablet (5 mg total) by mouth 3 (three) times daily before meals. 45 tablet 0  . naproxen (NAPROSYN) 500 MG tablet Take 1 tablet (500 mg total) by mouth 2 (two) times daily with a meal. 14 tablet 0  . omeprazole (PRILOSEC) 20 MG capsule Take 1 capsule (20 mg total) by mouth daily. 30 capsule 0  . pravastatin (PRAVACHOL) 10 MG tablet Take 1 tablet (10 mg total) by mouth daily. 90 tablet 1  . sertraline (ZOLOFT) 100 MG tablet Take 1 tablet (100 mg total) by mouth daily. 90 tablet 0  . SUMAtriptan (IMITREX) 100 MG tablet Take 1 tablet (100 mg total) by mouth 2 (two) times daily as needed for migraine. 10 tablet 3  . topiramate (TOPAMAX) 25 MG tablet 3 tablets at night for one week then take 4 tablets at night 120 tablet 3  . ALPRAZolam (XANAX) 0.5 MG tablet Take 2 tablets approximately 45 minutes prior to the MRI study, take a third tablet if needed. (Patient not taking: Reported on 01/11/2017) 3 tablet 0  . benzonatate (TESSALON) 200 MG capsule Take  1 capsule (200 mg total) by mouth 2 (two) times daily as needed for cough. (Patient not taking: Reported on 01/11/2017) 20 capsule 0  . diphenhydrAMINE (BENADRYL) 25 mg capsule Take 1 capsule (25 mg total) by mouth every 4 (four) hours as needed. (Patient not taking: Reported on 01/11/2017) 12 capsule 0  . docusate sodium (COLACE) 100 MG capsule Take 100 mg by mouth daily.     No facility-administered medications prior to visit.      ROS Review of Systems  Constitutional: Positive for malaise/fatigue. Negative for chills and fever.  Eyes: Negative for blurred vision.  Respiratory: Negative for shortness of breath.   Cardiovascular: Negative for chest pain and palpitations.   Gastrointestinal: Negative for abdominal pain and nausea.  Genitourinary: Negative for dysuria and hematuria.  Musculoskeletal: Negative for joint pain and myalgias.  Skin: Negative for rash.  Neurological: Negative for tingling and headaches.  Endo/Heme/Allergies: Positive for polydipsia.  Psychiatric/Behavioral: Negative for depression. The patient is not nervous/anxious.     Objective:  BP 126/83 (BP Location: Right Arm, Patient Position: Sitting, Cuff Size: Large)   Pulse 78   Temp 98.1 F (36.7 C) (Oral)   Wt 241 lb 3.2 oz (109.4 kg)   SpO2 98%   BMI 39.53 kg/m   BP/Weight 01/11/2017 10/06/2016 10/02/2016  Systolic BP 126 106 110  Diastolic BP 83 74 78  Wt. (Lbs) 241.2 244 246  BMI 39.53 39.99 40.31      Physical Exam  Constitutional: She is oriented to person, place, and time.  Well developed, obese, NAD, polite  HENT:  Head: Normocephalic and atraumatic.  Eyes: Conjunctivae are normal. No scleral icterus.  Neck: Normal range of motion. Neck supple. No thyromegaly present.  Cardiovascular: Normal rate, regular rhythm and normal heart sounds.   Pulmonary/Chest: Effort normal and breath sounds normal.  Genitourinary:  Genitourinary Comments: No CVA tenderness  Musculoskeletal: She exhibits no edema.  Neurological: She is alert and oriented to person, place, and time. No cranial nerve deficit. Coordination normal.  Skin: Skin is warm and dry. No rash noted. No erythema. No pallor.  Psychiatric: Her behavior is normal. Thought content normal.  Depressed mood  Vitals reviewed.    Assessment & Plan:    1. Type 2 diabetes mellitus without complication, without long-term current use of insulin (HCC) - HgB A1c 10.7% in clinic today - Increase insulin detemir (LEVEMIR) 100 UNIT/ML injection; Inject 0.5 mLs (50 Units total) into the skin 2 (two) times daily.  Dispense: 10 mL; Refill: 11 - Begin insulin aspart (NOVOLOG) 100 UNIT/ML injection; Inject 10 Units into the  skin 3 (three) times daily before meals.  Dispense: 10 mL; Refill: 11 - Refill metFORMIN (GLUCOPHAGE) 1000 MG tablet; Take 1 tablet (1,000 mg total) by mouth 2 (two) times daily with a meal.  Dispense: 180 tablet; Refill: 3 - Refill glimepiride (AMARYL) 4 MG tablet; Take 1 tablet (4 mg total) by mouth daily before breakfast.  Dispense: 90 tablet; Refill: 1 - Lancets (ACCU-CHEK SOFT TOUCH) lancets; Use as instructed  Dispense: 100 each; Refill: 12  2. UTI  - Urine Culture - Begin ciprofloxacin (CIPRO) 500 MG tablet; Take 1 tablet (500 mg total) by mouth 2 (two) times daily.  Dispense: 6 tablet; Refill: 0  3. Urinary frequency - Urinalysis Dipstick with moderate leukocytes  4. Hot flashes - Begin citalopram (CELEXA) 40 MG tablet; Take 1 tablet (40 mg total) by mouth daily.  Dispense: 30 tablet; Refill: 3  5. Hyperlipidemia, unspecified hyperlipidemia  type - Refill pravastatin (PRAVACHOL) 10 MG tablet; Take 1 tablet (10 mg total) by mouth daily.  Dispense: 90 tablet; Refill: 1  6. Other migraine with status migrainosus, intractable - Refill topiramate (TOPAMAX) 25 MG tablet; 3 tablets at night for one week then take 4 tablets at night  Dispense: 120 tablet; Refill: 3   Meds ordered this encounter  Medications  . insulin detemir (LEVEMIR) 100 UNIT/ML injection    Sig: Inject 0.5 mLs (50 Units total) into the skin 2 (two) times daily.    Dispense:  10 mL    Refill:  11    Order Specific Question:   Supervising Provider    Answer:   Quentin Angst [id number]  . insulin aspart (NOVOLOG) 100 UNIT/ML injection    Sig: Inject 10 Units into the skin 3 (three) times daily before meals.    Dispense:  10 mL    Refill:  11    Order Specific Question:   Supervising Provider    Answer:   Quentin Angst [id number]  . ciprofloxacin (CIPRO) 500 MG tablet    Sig: Take 1 tablet (500 mg total) by mouth 2 (two) times daily.    Dispense:  6 tablet    Refill:  0    Order Specific Question:    Supervising Provider    Answer:   Quentin Angst [id number]  . metFORMIN (GLUCOPHAGE) 1000 MG tablet    Sig: Take 1 tablet (1,000 mg total) by mouth 2 (two) times daily with a meal.    Dispense:  180 tablet    Refill:  3    Order Specific Question:   Supervising Provider    Answer:   Quentin Angst [id number]  . citalopram (CELEXA) 40 MG tablet    Sig: Take 1 tablet (40 mg total) by mouth daily.    Dispense:  30 tablet    Refill:  3    Order Specific Question:   Supervising Provider    Answer:   Quentin Angst [id number]  . glimepiride (AMARYL) 4 MG tablet    Sig: Take 1 tablet (4 mg total) by mouth daily before breakfast.    Dispense:  90 tablet    Refill:  1    Order Specific Question:   Supervising Provider    Answer:   Quentin Angst [id number]  . Lancets (ACCU-CHEK SOFT TOUCH) lancets    Sig: Use as instructed    Dispense:  100 each    Refill:  12    Order Specific Question:   Supervising Provider    Answer:   Quentin Angst [[id number]]  . pravastatin (PRAVACHOL) 10 MG tablet    Sig: Take 1 tablet (10 mg total) by mouth daily.    Dispense:  90 tablet    Refill:  1    Order Specific Question:   Supervising Provider    Answer:   Quentin Angst [id number]  . SUMAtriptan (IMITREX) 100 MG tablet    Sig: Take 1 tablet (100 mg total) by mouth 2 (two) times daily as needed for migraine.    Dispense:  10 tablet    Refill:  3    Order Specific Question:   Supervising Provider    Answer:   Quentin Angst [id number]  . topiramate (TOPAMAX) 25 MG tablet    Sig: 3 tablets at night for one week then take 4 tablets at night    Dispense:  120 tablet    Refill:  3    Order Specific Question:   Supervising Provider    Answer:   Quentin Angst [[id number]]    Follow-up: Return in about 4 weeks (around 02/08/2017) for DM. Bring glucose log.  Loletta Specter PA

## 2017-01-12 ENCOUNTER — Other Ambulatory Visit (INDEPENDENT_AMBULATORY_CARE_PROVIDER_SITE_OTHER): Payer: Self-pay | Admitting: Physician Assistant

## 2017-01-12 DIAGNOSIS — I10 Essential (primary) hypertension: Secondary | ICD-10-CM

## 2017-01-12 NOTE — Telephone Encounter (Signed)
FWD to PCP. Tempestt S Roberts, CMA  

## 2017-01-17 LAB — URINE CULTURE

## 2017-01-18 ENCOUNTER — Encounter (INDEPENDENT_AMBULATORY_CARE_PROVIDER_SITE_OTHER): Payer: Self-pay | Admitting: Physician Assistant

## 2017-02-08 ENCOUNTER — Ambulatory Visit (INDEPENDENT_AMBULATORY_CARE_PROVIDER_SITE_OTHER): Payer: Medicaid Other | Admitting: Physician Assistant

## 2017-02-23 ENCOUNTER — Other Ambulatory Visit (INDEPENDENT_AMBULATORY_CARE_PROVIDER_SITE_OTHER): Payer: Self-pay | Admitting: Physician Assistant

## 2017-02-23 DIAGNOSIS — F32A Depression, unspecified: Secondary | ICD-10-CM

## 2017-02-23 DIAGNOSIS — F329 Major depressive disorder, single episode, unspecified: Secondary | ICD-10-CM

## 2017-03-30 ENCOUNTER — Other Ambulatory Visit (INDEPENDENT_AMBULATORY_CARE_PROVIDER_SITE_OTHER): Payer: Self-pay | Admitting: Physician Assistant

## 2017-03-30 DIAGNOSIS — F32A Depression, unspecified: Secondary | ICD-10-CM

## 2017-03-30 DIAGNOSIS — F329 Major depressive disorder, single episode, unspecified: Secondary | ICD-10-CM

## 2017-03-30 DIAGNOSIS — J45909 Unspecified asthma, uncomplicated: Secondary | ICD-10-CM

## 2017-04-05 NOTE — Telephone Encounter (Signed)
FWD to PCP. Tempestt S Roberts, CMA  

## 2017-04-25 ENCOUNTER — Telehealth (INDEPENDENT_AMBULATORY_CARE_PROVIDER_SITE_OTHER): Payer: Self-pay | Admitting: Physician Assistant

## 2017-04-25 NOTE — Telephone Encounter (Signed)
Pain has a severe stomachache x 2 days and she wants pa Lily KocherGomez to rx something  Patient use [facility] . Please, call her back  Thank You

## 2017-04-25 NOTE — Telephone Encounter (Signed)
These type of request will require an appointment. Please call patient and schedule an appointment

## 2017-04-30 ENCOUNTER — Ambulatory Visit (INDEPENDENT_AMBULATORY_CARE_PROVIDER_SITE_OTHER): Payer: Medicaid Other | Admitting: Physician Assistant

## 2017-05-07 ENCOUNTER — Other Ambulatory Visit (INDEPENDENT_AMBULATORY_CARE_PROVIDER_SITE_OTHER): Payer: Self-pay | Admitting: Physician Assistant

## 2017-05-07 DIAGNOSIS — R232 Flushing: Secondary | ICD-10-CM

## 2017-05-08 ENCOUNTER — Ambulatory Visit (INDEPENDENT_AMBULATORY_CARE_PROVIDER_SITE_OTHER): Payer: Medicaid Other | Admitting: Physician Assistant

## 2017-05-08 ENCOUNTER — Encounter (INDEPENDENT_AMBULATORY_CARE_PROVIDER_SITE_OTHER): Payer: Self-pay | Admitting: Physician Assistant

## 2017-05-08 VITALS — BP 138/87 | HR 95 | Temp 98.4°F | Resp 18 | Ht 65.5 in | Wt 252.0 lb

## 2017-05-08 DIAGNOSIS — E1142 Type 2 diabetes mellitus with diabetic polyneuropathy: Secondary | ICD-10-CM | POA: Diagnosis not present

## 2017-05-08 DIAGNOSIS — Z8742 Personal history of other diseases of the female genital tract: Secondary | ICD-10-CM | POA: Diagnosis not present

## 2017-05-08 DIAGNOSIS — J0110 Acute frontal sinusitis, unspecified: Secondary | ICD-10-CM

## 2017-05-08 DIAGNOSIS — R102 Pelvic and perineal pain: Secondary | ICD-10-CM | POA: Diagnosis not present

## 2017-05-08 DIAGNOSIS — R05 Cough: Secondary | ICD-10-CM

## 2017-05-08 DIAGNOSIS — R059 Cough, unspecified: Secondary | ICD-10-CM

## 2017-05-08 LAB — GLUCOSE, POCT (MANUAL RESULT ENTRY): POC GLUCOSE: 92 mg/dL (ref 70–99)

## 2017-05-08 MED ORDER — BENZONATATE 200 MG PO CAPS: 200.0000 mg | ORAL_CAPSULE | Freq: Two times a day (BID) | ORAL | 0 refills | Status: DC | PRN

## 2017-05-08 MED ORDER — NAPROXEN 500 MG PO TABS: 500.0000 mg | ORAL_TABLET | Freq: Two times a day (BID) | ORAL | 0 refills | Status: DC

## 2017-05-08 MED ORDER — AMOXICILLIN-POT CLAVULANATE 875-125 MG PO TABS: 1.0000 | ORAL_TABLET | Freq: Two times a day (BID) | ORAL | 0 refills | Status: DC

## 2017-05-08 MED ORDER — GABAPENTIN 100 MG PO CAPS: 100.0000 mg | ORAL_CAPSULE | Freq: Three times a day (TID) | ORAL | 3 refills | Status: DC

## 2017-05-08 NOTE — Telephone Encounter (Signed)
Pt had an appointment 05-08-17

## 2017-05-08 NOTE — Patient Instructions (Signed)

## 2017-05-08 NOTE — Progress Notes (Signed)
Subjective:  Patient ID: [name], female    DOB: 1971/03/31  Age: 47 y.o. MRN: [id number]  CC: DM and URI  HPI  [name] a 47 y.o.femalewith a PMH of anxiety, depression, DM2, and HTN presents with concern for URI. Cough, nasal congestion, fever, chills, ear pain, sore throat, eye pressure, and body aches since two weeks ago. Chest tightness with coughing. Grandchildren were ill with similar symptoms 2-3 weeks ago. Has taken Mucinex and Delsym with no relief.     Patient complains of right sided pelvic pain. Had been diagnosed with an ovarian cyst a few years ago. Was initially offered to have the cyst removed but she was in prison and did not want to recover in prison. Has been having "menstrual like cramps" on the right adnexal region. Does not endorse current bleeding. Greater than 12 months with amenorrhea.         Outpatient Medications Prior to Visit  Medication Sig Dispense Refill  . aspirin EC 81 MG tablet Take 81 mg by mouth every 6 (six) hours as needed for mild pain.     . beclomethasone (QVAR) 40 MCG/ACT inhaler Inhale 2 puffs into the lungs 2 (two) times daily.    . cetirizine (ZYRTEC) 10 MG tablet Take 1 tablet (10 mg total) by mouth daily. 30 tablet 0  . citalopram (CELEXA) 40 MG tablet Take 1 tablet (40 mg total) by mouth daily. 30 tablet 3  . docusate sodium (COLACE) 100 MG capsule Take 100 mg by mouth daily.    Marland Kitchen glimepiride (AMARYL) 4 MG tablet Take 1 tablet (4 mg total) by mouth daily before breakfast. 90 tablet 1  . glucose blood (ACCU-CHEK AVIVA PLUS) test strip Use as instructed 100 each 12  . hydrochlorothiazide (HYDRODIURIL) 25 MG tablet Take 1 tablet (25 mg total) by mouth daily. 90 tablet 1  . insulin aspart (NOVOLOG) 100 UNIT/ML injection Inject 10 Units into the skin 3 (three) times daily before meals. 10 mL 11  . insulin detemir (LEVEMIR) 100 UNIT/ML injection Inject 0.5 mLs (50 Units total) into the skin 2 (two) times daily. 10 mL 11  .  lactulose (CHRONULAC) 10 GM/15ML solution Take 30 mLs (20 g total) by mouth 2 (two) times daily as needed for mild constipation. 236 mL 3  . Lancets (ACCU-CHEK SOFT TOUCH) lancets Use as instructed 100 each 12  . metFORMIN (GLUCOPHAGE) 1000 MG tablet Take 1 tablet (1,000 mg total) by mouth 2 (two) times daily with a meal. 180 tablet 3  . metoCLOPramide (REGLAN) 5 MG tablet Take 1 tablet (5 mg total) by mouth 3 (three) times daily before meals. 45 tablet 0  . naproxen (NAPROSYN) 500 MG tablet Take 1 tablet (500 mg total) by mouth 2 (two) times daily with a meal. 14 tablet 0  . omeprazole (PRILOSEC) 20 MG capsule Take 1 capsule (20 mg total) by mouth daily. 30 capsule 0  . pravastatin (PRAVACHOL) 10 MG tablet Take 1 tablet (10 mg total) by mouth daily. 90 tablet 1  . PROVENTIL HFA 108 (90 Base) MCG/ACT inhaler Inhale 1-2 puffs into the lungs every 6 (six) hours as needed for wheezing or shortness of breath. 1 Inhaler 5  . sertraline (ZOLOFT) 100 MG tablet Take 1 tablet (100 mg total) by mouth daily. 90 tablet 0  . SUMAtriptan (IMITREX) 100 MG tablet Take 1 tablet (100 mg total) by mouth 2 (two) times daily as needed for migraine. 10 tablet 3  . topiramate (TOPAMAX) 25 MG  tablet 3 tablets at night for one week then take 4 tablets at night 120 tablet 3   No facility-administered medications prior to visit.      ROS Review of Systems  Constitutional: Positive for chills, fever and malaise/fatigue.  HENT: Positive for congestion, ear pain, sinus pain and sore throat.   Eyes: Positive for pain. Negative for blurred vision.  Respiratory: Positive for cough. Negative for shortness of breath.   Cardiovascular: Negative for chest pain and palpitations.       Chest tightness  Gastrointestinal: Negative for abdominal pain, blood in stool, diarrhea, nausea and vomiting.  Genitourinary: Negative for dysuria and hematuria.  Musculoskeletal: Negative for joint pain and myalgias.  Skin: Negative for rash.   Neurological: Negative for tingling and headaches.  Psychiatric/Behavioral: Negative for depression. The patient is not nervous/anxious.     Objective:  BP 138/87 (BP Location: Right Arm, Patient Position: Sitting, Cuff Size: Large)   Pulse 95   Temp 98.4 F (36.9 C) (Oral)   Resp 18   Ht 5' 5.5" (1.664 m)   Wt 252 lb (114.3 kg)   SpO2 100%   BMI 41.30 kg/m   BP/Weight 05/08/2017 01/11/2017 10/06/2016  Systolic BP 138 126 106  Diastolic BP 87 83 74  Wt. (Lbs) 252 241.2 244  BMI 41.3 39.53 39.99      Physical Exam  Constitutional: She is oriented to person, place, and time.  Well developed, obese, appears moderately ill but not toxic, polite  HENT:  Head: Normocephalic and atraumatic.  Mouth/Throat: No oropharyngeal exudate.  TM erythematous and bulging bilaterally, Turbinates moderately hypertrophic, mild postnasal drip, Tenderness of the frontal sinus bilaterally.   Eyes: Conjunctivae and EOM are normal. Pupils are equal, round, and reactive to light. Right eye exhibits no discharge. Left eye exhibits no discharge. No scleral icterus.  Neck: Normal range of motion. Neck supple. No thyromegaly present.  Cardiovascular: Normal rate, regular rhythm and normal heart sounds.  Pulmonary/Chest: Effort normal and breath sounds normal. No respiratory distress. She has no wheezes.  Musculoskeletal: She exhibits no edema.  Lymphadenopathy:    She has cervical adenopathy (mild anterior chain cervical lymphadenopathy).  Neurological: She is alert and oriented to person, place, and time. No cranial nerve deficit. Coordination normal.  Skin: Skin is warm and dry. No rash noted. No erythema. No pallor.  Psychiatric: She has a normal mood and affect. Her behavior is normal. Thought content normal.  Vitals reviewed.    Assessment & Plan:   1. Type 2 diabetes mellitus with diabetic polyneuropathy, without long-term current use of insulin (HCC) - Glucose (CBG) - Hemoglobin A1c -  gabapentin (NEURONTIN) 100 MG capsule; Take 1 capsule (100 mg total) by mouth 3 (three) times daily.  Dispense: 90 capsule; Refill: 3  2. Acute non-recurrent frontal sinusitis - amoxicillin-clavulanate (AUGMENTIN) 875-125 MG tablet; Take 1 tablet by mouth 2 (two) times daily.  Dispense: 20 tablet; Refill: 0 - naproxen (NAPROSYN) 500 MG tablet; Take 1 tablet (500 mg total) by mouth 2 (two) times daily with a meal.  Dispense: 30 tablet; Refill: 0  3. Cough - amoxicillin-clavulanate (AUGMENTIN) 875-125 MG tablet; Take 1 tablet by mouth 2 (two) times daily.  Dispense: 20 tablet; Refill: 0 - naproxen (NAPROSYN) 500 MG tablet; Take 1 tablet (500 mg total) by mouth 2 (two) times daily with a meal.  Dispense: 30 tablet; Refill: 0 - benzonatate (TESSALON) 200 MG capsule; Take 1 capsule (200 mg total) by mouth 2 (two) times daily as  needed for cough.  Dispense: 20 capsule; Refill: 0  4. Pelvic pain in female - US Pelvis Complete; Future - US Transvaginal Non-OB; Future  5. History of ovarian cyst - US Pelvis Complete; Future - US Transvaginal Non-OB; Future   Meds ordered this encounter  Medications  . amoxicillin-clavulanate (AUGMENTIN) 875-125 MG tablet    Sig: Take 1 tablet by mouth 2 (two) times daily.    Dispense:  20 tablet    Refill:  0    Order Specific Question:   Supervising Provider    Answer:   Quentin AngstJEGEDE, OLUGBEMIGA E [id number]  . naproxen (NAPROSYN) 500 MG tablet    Sig: Take 1 tablet (500 mg total) by mouth 2 (two) times daily with a meal.    Dispense:  30 tablet    Refill:  0    Order Specific Question:   Supervising Provider    Answer:   Quentin AngstJEGEDE, OLUGBEMIGA E [id number]  . gabapentin (NEURONTIN) 100 MG capsule    Sig: Take 1 capsule (100 mg total) by mouth 3 (three) times daily.    Dispense:  90 capsule    Refill:  3    Order Specific Question:   Supervising Provider    Answer:   Quentin AngstJEGEDE, OLUGBEMIGA E [id number]  . benzonatate (TESSALON) 200 MG capsule    Sig: Take 1 capsule  (200 mg total) by mouth 2 (two) times daily as needed for cough.    Dispense:  20 capsule    Refill:  0    Order Specific Question:   Supervising Provider    Answer:   Quentin AngstJEGEDE, OLUGBEMIGA E [[id number]]    Follow-up: Return in about 3 months (around 08/06/2017) for diabetes.   Loletta Specteroger David Iriana Artley PA

## 2017-05-09 LAB — HEMOGLOBIN A1C
Est. average glucose Bld gHb Est-mCnc: 166 mg/dL
HEMOGLOBIN A1C: 7.4 % — AB (ref 4.8–5.6)

## 2017-05-09 NOTE — Telephone Encounter (Signed)
Patient verified DOB Patient is aware of A1C BEING 7.4 which is almost at goal of below 7.0. Patient advised to continue with daily exercise.

## 2017-05-09 NOTE — Telephone Encounter (Signed)
-----   Message from Loletta Specteroger David Gomez, PA-C sent at 05/09/2017  8:58 AM EST ----- A1c is better than previous. She is almost at her goal. Should be below seven. Can exercise more regularly.

## 2017-05-10 ENCOUNTER — Telehealth (INDEPENDENT_AMBULATORY_CARE_PROVIDER_SITE_OTHER): Payer: Self-pay | Admitting: Physician Assistant

## 2017-05-10 NOTE — Telephone Encounter (Signed)
Medical Assistant left message on [name]'s work voicemail. Voicemail states to give a call back to [name] at [phone] for any concerns. PA was completed for Ultrasounds. Authorization number is [id number] Effective 05/10/17-06/09/17.

## 2017-05-10 NOTE — Telephone Encounter (Signed)
[facility]  Dept. Elita Quick(Pam) called to request a Pre-Auth from medicaid since this Pt has medicaid and has an appt for tomorrow and she need this  by 2PM today if you have any question please call Pam at [phone]

## 2017-05-11 ENCOUNTER — Ambulatory Visit (HOSPITAL_COMMUNITY)
Admission: RE | Admit: 2017-05-11 | Discharge: 2017-05-11 | Disposition: A | Discharge status: Home / Self Care | Payer: Medicaid Other | Source: Ambulatory Visit | Attending: Physician Assistant | Admitting: Physician Assistant

## 2017-05-11 DIAGNOSIS — Z8742 Personal history of other diseases of the female genital tract: Secondary | ICD-10-CM | POA: Diagnosis present

## 2017-05-11 DIAGNOSIS — R102 Pelvic and perineal pain: Secondary | ICD-10-CM | POA: Diagnosis present

## 2017-05-11 DIAGNOSIS — N83201 Unspecified ovarian cyst, right side: Secondary | ICD-10-CM | POA: Diagnosis not present

## 2017-05-14 ENCOUNTER — Other Ambulatory Visit (INDEPENDENT_AMBULATORY_CARE_PROVIDER_SITE_OTHER): Payer: Self-pay | Admitting: Physician Assistant

## 2017-05-14 DIAGNOSIS — N83201 Unspecified ovarian cyst, right side: Secondary | ICD-10-CM

## 2017-05-15 ENCOUNTER — Telehealth (INDEPENDENT_AMBULATORY_CARE_PROVIDER_SITE_OTHER): Payer: Self-pay | Admitting: *Deleted

## 2017-05-15 NOTE — Telephone Encounter (Signed)
Pelvic pain discussed in her encounters but not specifically leg pain. Needs evaluation.

## 2017-05-15 NOTE — Telephone Encounter (Signed)
Please schedule patient for OV in regards to leg pain and referral to [facility].

## 2017-05-15 NOTE — Telephone Encounter (Signed)
-----   Message from Loletta Specteroger David Gomez, PA-C sent at 05/14/2017 11:09 AM EST ----- 1. Interval 3.4 cm hemorrhagic right ovarian cyst. 2. Stable 6.9 cm essentially simple right ovarian cyst. 3. Nonvisualized left ovary.  I will send to GYN for consult. She should receive a call in a few weeks about her consult.

## 2017-05-15 NOTE — Telephone Encounter (Signed)
Patient verified DOB Patient is aware of Interval 3.4cm hemorrhagic cyst and Simple 6.9cm being noted on the right ovary. Patient is aware of referral being placed to the gynecologist and patient receiving a call within 3 weeks for initial appointment. Patient complains of also of increased leg pain and requesting a referral back to surgeon. MA will forward request to PCP for approval.

## 2017-05-15 NOTE — Telephone Encounter (Signed)
Pt was called to schedule appt woith the pcp for OV and referral, LVM

## 2017-05-17 ENCOUNTER — Encounter (INDEPENDENT_AMBULATORY_CARE_PROVIDER_SITE_OTHER): Payer: Self-pay | Admitting: Physician Assistant

## 2017-05-17 ENCOUNTER — Ambulatory Visit (INDEPENDENT_AMBULATORY_CARE_PROVIDER_SITE_OTHER): Payer: Medicaid Other | Admitting: Physician Assistant

## 2017-05-17 VITALS — BP 129/84 | HR 82 | Temp 97.5°F | Resp 18 | Ht 65.5 in | Wt 254.0 lb

## 2017-05-17 DIAGNOSIS — M1711 Unilateral primary osteoarthritis, right knee: Secondary | ICD-10-CM

## 2017-05-17 DIAGNOSIS — H538 Other visual disturbances: Secondary | ICD-10-CM | POA: Diagnosis not present

## 2017-05-17 MED ORDER — ACETAMINOPHEN-CODEINE #3 300-30 MG PO TABS: 1.0000 | ORAL_TABLET | Freq: Two times a day (BID) | ORAL | 0 refills | Status: DC

## 2017-05-17 MED ORDER — NAPROXEN 500 MG PO TABS: 500.0000 mg | ORAL_TABLET | Freq: Two times a day (BID) | ORAL | 0 refills | Status: DC

## 2017-05-17 NOTE — Patient Instructions (Signed)

## 2017-05-17 NOTE — Progress Notes (Signed)
Subjective:  Patient ID: [name], female    DOB: October 20, 1970  Age: 47 y.o. MRN: [id number]  CC: leg pain  HPI [name] a 47 y.o.femalewith a PMH of anxiety, depression, DM2, and HTN presents with right knee pain since 1998. [name]. Had been receiving intraarticular steroid injections but treatment stopped when she went to prison for six years. Had taken NSAIDs and warm compresses in prison. Last xray was done 08/15/1998 which revealed the following:  RIGHT KNEE TWO VIEWS: WHEN COMPARED TO 05-20-96 FILMS, THE PATIENT HAS DEVELOPED DEGENERATIVE OSTEOARTHRITIS CHANGES OF THE LATERAL FEMORAL TIBIAL JOINT.  THERE IS JOINT SPACE NARROWING AND OSTEOPHYTIC LIPPING.  NO RADIOPAQUE LOOSE BODY OR JOINT EFFUSION IS DETECTED.  Right knee pain is severe and exacerbated with prolonged standing or walking. Blames the orthopedic surgeon at [name] and [facility] for her knee pain. Said the surgeon removed all her ligaments in the right knee. Would like to be referred to orthopedic specialist. Would also like a referral for ophthalmologist for visual acuity testing since her vision is somewhat blurred.     Outpatient Medications Prior to Visit  Medication Sig Dispense Refill  . amoxicillin-clavulanate (AUGMENTIN) 875-125 MG tablet Take 1 tablet by mouth 2 (two) times daily. 20 tablet 0  . aspirin EC 81 MG tablet Take 81 mg by mouth every 6 (six) hours as needed for mild pain.     . beclomethasone (QVAR) 40 MCG/ACT inhaler Inhale 2 puffs into the lungs 2 (two) times daily.    . benzonatate (TESSALON) 200 MG capsule Take 1 capsule (200 mg total) by mouth 2 (two) times daily as needed for cough. 20 capsule 0  . cetirizine (ZYRTEC) 10 MG tablet Take 1 tablet (10 mg total) by mouth daily. 30 tablet 0  . citalopram (CELEXA) 40 MG tablet Take 1 tablet (40 mg total) by mouth daily. 30 tablet 3  . docusate sodium (COLACE) 100 MG capsule Take 100 mg by mouth daily.     Marland Kitchen gabapentin (NEURONTIN) 100 MG capsule Take 1 capsule (100 mg total) by mouth 3 (three) times daily. 90 capsule 3  . glimepiride (AMARYL) 4 MG tablet Take 1 tablet (4 mg total) by mouth daily before breakfast. 90 tablet 1  . glucose blood (ACCU-CHEK AVIVA PLUS) test strip Use as instructed 100 each 12  . hydrochlorothiazide (HYDRODIURIL) 25 MG tablet Take 1 tablet (25 mg total) by mouth daily. 90 tablet 1  . insulin aspart (NOVOLOG) 100 UNIT/ML injection Inject 10 Units into the skin 3 (three) times daily before meals. 10 mL 11  . insulin detemir (LEVEMIR) 100 UNIT/ML injection Inject 0.5 mLs (50 Units total) into the skin 2 (two) times daily. 10 mL 11  . lactulose (CHRONULAC) 10 GM/15ML solution Take 30 mLs (20 g total) by mouth 2 (two) times daily as needed for mild constipation. 236 mL 3  . Lancets (ACCU-CHEK SOFT TOUCH) lancets Use as instructed 100 each 12  . metFORMIN (GLUCOPHAGE) 1000 MG tablet Take 1 tablet (1,000 mg total) by mouth 2 (two) times daily with a meal. 180 tablet 3  . metoCLOPramide (REGLAN) 5 MG tablet Take 1 tablet (5 mg total) by mouth 3 (three) times daily before meals. 45 tablet 0  . naproxen (NAPROSYN) 500 MG tablet Take 1 tablet (500 mg total) by mouth 2 (two) times daily with a meal. 30 tablet 0  . omeprazole (PRILOSEC) 20 MG capsule Take 1 capsule (20 mg total) by mouth daily.  30 capsule 0  . pravastatin (PRAVACHOL) 10 MG tablet Take 1 tablet (10 mg total) by mouth daily. 90 tablet 1  . PROVENTIL HFA 108 (90 Base) MCG/ACT inhaler Inhale 1-2 puffs into the lungs every 6 (six) hours as needed for wheezing or shortness of breath. 1 Inhaler 5  . sertraline (ZOLOFT) 100 MG tablet Take 1 tablet (100 mg total) by mouth daily. 90 tablet 0  . SUMAtriptan (IMITREX) 100 MG tablet Take 1 tablet (100 mg total) by mouth 2 (two) times daily as needed for migraine. 10 tablet 3  . topiramate (TOPAMAX) 25 MG tablet 3 tablets at night for one week then take 4 tablets at night 120 tablet  3   No facility-administered medications prior to visit.      ROS Review of Systems  Constitutional: Negative for chills, fever and malaise/fatigue.  Eyes: Positive for blurred vision.  Respiratory: Negative for shortness of breath.   Cardiovascular: Negative for chest pain and palpitations.  Gastrointestinal: Negative for abdominal pain and nausea.  Genitourinary: Negative for dysuria and hematuria.  Musculoskeletal: Positive for joint pain. Negative for myalgias.  Skin: Negative for rash.  Neurological: Negative for tingling and headaches.  Psychiatric/Behavioral: Negative for depression. The patient is not nervous/anxious.     Objective:  BP 129/84 (BP Location: Left Arm, Patient Position: Sitting, Cuff Size: Large)   Pulse 82   Temp (!) 97.5 F (36.4 C) (Oral)   Resp 18   Ht 5' 5.5" (1.664 m)   Wt 254 lb (115.2 kg)   SpO2 100%   BMI 41.62 kg/m   BP/Weight 05/17/2017 05/08/2017 01/11/2017  Systolic BP 129 138 126  Diastolic BP 84 87 83  Wt. (Lbs) 254 252 241.2  BMI 41.62 41.3 39.53      Physical Exam  Constitutional: She is oriented to person, place, and time.  Well developed, obese, NAD, blunt and somewhat rude  HENT:  Head: Normocephalic and atraumatic.  Eyes: No scleral icterus.  Pulmonary/Chest: Effort normal.  Musculoskeletal:  Right knee is mildly edematous compared to the left. Midline joint tenderness greater on medial aspect. Mild patellar TTP.  Neurological: She is alert and oriented to person, place, and time.  Mildly antalgic gait favoring the right side  Skin: Skin is warm and dry. No rash noted. No erythema. No pallor.  Psychiatric: She has a normal mood and affect. Her behavior is normal. Thought content normal.  Vitals reviewed.    Assessment & Plan:    1. Osteoarthritis of right knee, unspecified osteoarthritis type - DG Knee Complete 4 Views Right; Future - AMB referral to orthopedics - acetaminophen-codeine (TYLENOL #3) 300-30 MG  tablet; Take 1 tablet by mouth 2 times daily at 12 noon and 4 pm.  Dispense: 40 tablet; Refill: 0 - naproxen (NAPROSYN) 500 MG tablet; Take 1 tablet (500 mg total) by mouth 2 (two) times daily with a meal.  Dispense: 30 tablet; Refill: 0  2. Blurring of visual image - Ambulatory referral to Ophthalmology   Meds ordered this encounter  Medications  . acetaminophen-codeine (TYLENOL #3) 300-30 MG tablet    Sig: Take 1 tablet by mouth 2 times daily at 12 noon and 4 pm.    Dispense:  40 tablet    Refill:  0    Order Specific Question:   Supervising Provider    Answer:   Quentin Angst [id number]  . naproxen (NAPROSYN) 500 MG tablet    Sig: Take 1 tablet (500 mg total) by mouth  2 (two) times daily with a meal.    Dispense:  30 tablet    Refill:  0    Order Specific Question:   Supervising Provider    Answer:   Quentin AngstJEGEDE, OLUGBEMIGA E [id number]]    Follow-up: Return in about 8 weeks (around 07/12/2017).   Loletta Specteroger David Oumou Smead PA

## 2017-05-21 ENCOUNTER — Ambulatory Visit (INDEPENDENT_AMBULATORY_CARE_PROVIDER_SITE_OTHER): Payer: Medicaid Other | Admitting: Family Medicine

## 2017-05-21 ENCOUNTER — Other Ambulatory Visit (HOSPITAL_COMMUNITY)
Admission: RE | Admit: 2017-05-21 | Discharge: 2017-05-21 | Disposition: A | Discharge status: Home / Self Care | Payer: Medicaid Other | Source: Ambulatory Visit | Attending: Family Medicine | Admitting: Family Medicine

## 2017-05-21 ENCOUNTER — Telehealth: Payer: Self-pay | Admitting: *Deleted

## 2017-05-21 ENCOUNTER — Encounter: Payer: Self-pay | Admitting: Family Medicine

## 2017-05-21 VITALS — BP 146/75 | HR 69 | Ht 65.5 in | Wt 254.6 lb

## 2017-05-21 DIAGNOSIS — N950 Postmenopausal bleeding: Secondary | ICD-10-CM

## 2017-05-21 DIAGNOSIS — N83201 Unspecified ovarian cyst, right side: Secondary | ICD-10-CM | POA: Diagnosis present

## 2017-05-21 LAB — POCT PREGNANCY, URINE: PREG TEST UR: NEGATIVE

## 2017-05-21 MED ORDER — DICLOFENAC SODIUM 75 MG PO TBEC: 75.0000 mg | DELAYED_RELEASE_TABLET | Freq: Two times a day (BID) | ORAL | 3 refills | Status: DC | PRN

## 2017-05-21 MED ORDER — SULINDAC 150 MG PO TABS: 150.0000 mg | ORAL_TABLET | Freq: Two times a day (BID) | ORAL | 0 refills | Status: DC

## 2017-05-21 NOTE — Addendum Note (Signed)
Addended by: Levie HeritageSTINSON, Ambert Virrueta J on: 05/21/2017 05:05 PM   Modules accepted: Orders

## 2017-05-21 NOTE — Telephone Encounter (Signed)
Received fax from after hours call center stating that pt was calling regarding a medication issue. I called pt's pharmacy and spoke w/pharmacist who stated that Medicaid would not pay for the Sulindac due to pt already taking Naproxen. Pt informed him that she has stopped the Naproxen so he will run the prescription again tomorrow with that information and stated that it will be approved by Medicaid. I called pt and let her know to expect to obtain the new Rx tomorrow. I also informed her that she definitely may not take both Naproxen and Sulindac. Pt expressed gratitude and voiced understanding.

## 2017-05-21 NOTE — Patient Instructions (Signed)

## 2017-05-21 NOTE — Progress Notes (Signed)
Subjective:    Patient ID: [name], female    DOB: 11-05-1970, 47 y.o.   MRN: [id number]  HPI  Patient seen for right pelvic pain that she has had intermittently. Pain has been more constant, worse over past month. Was diagnosed with ovarian cyst years ago, was offered surgery for removal, but declined as she was in prison and did not want to recover there. Was seen by PA in PCP's office - referred here after US showed 2 cysts: 7cm simple cyst and 3.5cm hemorrhagic cyst.   Pt also had vaginal bleeding at end of January for 2-3 days. Had been amenorrheic for >12 months at that point. US showed endometrial stripe of 10cm. DM2, obese risk factors for endometrial cancer. Last HgA1c 7.4  I have reviewed the patients past medical, family, and social history.  I have reviewed the patient's medication list and allergies.  Review of Systems  All other systems reviewed and are negative.      Objective:   Physical Exam  Constitutional: She is oriented to person, place, and time. She appears well-developed and well-nourished.  HENT:  Head: Normocephalic and atraumatic.  Right Ear: External ear normal.  Left Ear: External ear normal.  Cardiovascular: Normal rate, regular rhythm and normal heart sounds.  Pulmonary/Chest: Effort normal and breath sounds normal.  Abdominal: Soft. Bowel sounds are normal. She exhibits no distension. There is tenderness (RLQ/right pelvic pain). Hernia confirmed negative in the right inguinal area and confirmed negative in the left inguinal area.  Genitourinary: There is no rash, tenderness or lesion on the right labia. There is no rash, tenderness or lesion on the left labia. Cervix exhibits no motion tenderness, no discharge and no friability. No erythema, tenderness or bleeding in the vagina. No foreign body in the vagina. No signs of injury around the vagina. No vaginal discharge found.  Lymphadenopathy:       Right: No inguinal adenopathy present.   Left: No inguinal adenopathy present.  Neurological: She is alert and oriented to person, place, and time.  Skin: Skin is warm and dry.  Psychiatric: She has a normal mood and affect. Her behavior is normal. Judgment and thought content normal.   ENDOMETRIAL BIOPSY     The indications for endometrial biopsy were reviewed.   Risks of the biopsy including cramping, bleeding, infection, uterine perforation, inadequate specimen and need for additional procedures  were discussed. The patient states she understands and agrees to undergo procedure today. Consent was signed. Time out was performed. Urine HCG was negative. A sterile speculum was placed in the patient's vagina and the cervix was prepped with Betadine. A single-toothed tenaculum was placed on the anterior lip of the cervix to stabilize it. The 3 mm pipelle was introduced into the endometrial cavity without difficulty to a depth of 10 cm, and a scant amount of tissue was obtained and sent to pathology. The instruments were removed from the patient's vagina. Minimal bleeding from the cervix was noted. The patient tolerated the procedure well. Routine post-procedure instructions were given to the patient. The patient will follow up to review the results and for further management.       Assessment & Plan:  1. Cyst of right ovary Patient wants cyst removed. I discussed with her that difficult to just remove cyst, esp with hemorrhagic cyst. She may lose her ovary, which she is fine with. Will have next appt with gyn surgeon. At this point, will treat with diclofenac for pain control.  2. Postmenopausal bleeding Endometrial biopsy done. - Surgical pathology

## 2017-05-22 ENCOUNTER — Ambulatory Visit (HOSPITAL_COMMUNITY)
Admission: RE | Admit: 2017-05-22 | Discharge: 2017-05-22 | Disposition: A | Discharge status: Home / Self Care | Payer: Medicaid Other | Source: Ambulatory Visit | Attending: Physician Assistant | Admitting: Physician Assistant

## 2017-05-22 ENCOUNTER — Encounter: Payer: Self-pay | Admitting: *Deleted

## 2017-05-22 ENCOUNTER — Telehealth (INDEPENDENT_AMBULATORY_CARE_PROVIDER_SITE_OTHER): Payer: Self-pay | Admitting: Physician Assistant

## 2017-05-22 DIAGNOSIS — M1711 Unilateral primary osteoarthritis, right knee: Secondary | ICD-10-CM | POA: Diagnosis present

## 2017-05-22 NOTE — Telephone Encounter (Signed)
[facility] and she had a biopsy  Of the Right ovary they give her Naproxen but is not helping and she has a severe pain  Please, call her at  [phone]  Thank You

## 2017-05-22 NOTE — Telephone Encounter (Signed)
Patient verified DOB Patient is aware of PCP agreeing with plan of taking naprosyn to address biopsy pain. Patient is aware of pain being common after a biopsy. Patient advised to take medication and monitor pain over the next few days. If pain persist or increases contact gynecology. No further questions.

## 2017-05-25 ENCOUNTER — Telehealth: Payer: Self-pay | Admitting: Obstetrics and Gynecology

## 2017-05-25 NOTE — Telephone Encounter (Signed)
Calling to get test results and said she is in a lot of pain and feel funny in her vaginal area.

## 2017-05-28 ENCOUNTER — Telehealth: Payer: Self-pay

## 2017-05-28 DIAGNOSIS — N939 Abnormal uterine and vaginal bleeding, unspecified: Secondary | ICD-10-CM

## 2017-05-28 MED ORDER — MEGESTROL ACETATE 40 MG PO TABS: 40.0000 mg | ORAL_TABLET | Freq: Two times a day (BID) | ORAL | 1 refills | Status: DC

## 2017-05-28 NOTE — Telephone Encounter (Addendum)
Pt called the office stating that she has been bleeding since her endometrial biopsy on 05/21/17. Per Dr. Marice Potterove, pt should take Megace to help with the bleeding. Megace 40mg  bid was sent to pt's pharmacy. Pt advised to call our office if she has any questions. Pt verbalized understanding.

## 2017-05-29 NOTE — Telephone Encounter (Signed)
Called patient to check on her. Verified that patient was informed of negative biopsy results. Also asked about pain and bleeding. She states pain is a bit better, she is picking up her megace today and will let us know if this does not help. She had no further questions or needs.

## 2017-05-31 ENCOUNTER — Telehealth (INDEPENDENT_AMBULATORY_CARE_PROVIDER_SITE_OTHER): Payer: Self-pay | Admitting: Physician Assistant

## 2017-05-31 NOTE — Telephone Encounter (Signed)
Thank you  All the Ophthalmology specialist are the same  . Patient need to paid out of her pocket

## 2017-05-31 NOTE — Telephone Encounter (Signed)
[facility] did not want to schedule an appt with them since Medicaid does not paid ruting eye exam only medical, so she did not schedule an appt

## 2017-06-01 ENCOUNTER — Other Ambulatory Visit (INDEPENDENT_AMBULATORY_CARE_PROVIDER_SITE_OTHER): Payer: Self-pay | Admitting: Physician Assistant

## 2017-06-01 DIAGNOSIS — M1711 Unilateral primary osteoarthritis, right knee: Secondary | ICD-10-CM

## 2017-06-12 NOTE — Telephone Encounter (Signed)
FWD to PCP. [name], CMA  

## 2017-06-14 ENCOUNTER — Ambulatory Visit (INDEPENDENT_AMBULATORY_CARE_PROVIDER_SITE_OTHER): Payer: Medicaid Other | Admitting: Orthopedic Surgery

## 2017-06-14 ENCOUNTER — Other Ambulatory Visit (INDEPENDENT_AMBULATORY_CARE_PROVIDER_SITE_OTHER): Payer: Self-pay | Admitting: Physician Assistant

## 2017-06-14 DIAGNOSIS — G43811 Other migraine, intractable, with status migrainosus: Secondary | ICD-10-CM

## 2017-06-14 DIAGNOSIS — E119 Type 2 diabetes mellitus without complications: Secondary | ICD-10-CM

## 2017-06-14 NOTE — Telephone Encounter (Signed)
FWD to PCP. Kenny Rea S Charli Halle, CMA  

## 2017-06-18 ENCOUNTER — Ambulatory Visit: Payer: Medicaid Other | Admitting: Obstetrics & Gynecology

## 2017-06-25 ENCOUNTER — Ambulatory Visit (INDEPENDENT_AMBULATORY_CARE_PROVIDER_SITE_OTHER): Payer: Medicaid Other | Admitting: Orthopedic Surgery

## 2017-07-04 ENCOUNTER — Ambulatory Visit (INDEPENDENT_AMBULATORY_CARE_PROVIDER_SITE_OTHER): Payer: Medicaid Other | Admitting: Orthopedic Surgery

## 2017-07-04 ENCOUNTER — Encounter (INDEPENDENT_AMBULATORY_CARE_PROVIDER_SITE_OTHER): Payer: Self-pay | Admitting: Orthopedic Surgery

## 2017-07-04 DIAGNOSIS — M1711 Unilateral primary osteoarthritis, right knee: Secondary | ICD-10-CM | POA: Diagnosis not present

## 2017-07-04 MED ORDER — TRAMADOL HCL 50 MG PO TABS: ORAL_TABLET | ORAL | 0 refills | Status: DC

## 2017-07-07 ENCOUNTER — Encounter (INDEPENDENT_AMBULATORY_CARE_PROVIDER_SITE_OTHER): Payer: Self-pay | Admitting: Orthopedic Surgery

## 2017-07-07 NOTE — Progress Notes (Addendum)
Office Visit Note   Patient: [name]           Date of Birth: 1970-12-29           MRN: [id number] Visit Date: 07/04/2017 Requested by: Loletta Specter, PA-C [address] [place], [place] [address] PCP: Denny Levy  Subjective: Chief Complaint  Patient presents with  . Right Knee - Pain    HPI: [name] is a patient with right knee pain.  She injured her knee initially in 2007 and had what sounds like arthroscopic surgery and partial meniscectomy.  She reports continued pain and it is getting worse.  She has been in prison for a while.  She describes swelling weakness giving way and popping in that right knee.  She states that she cannot work or hold a job due to the pain.  She does have type 2 diabetes but her hemoglobin A1c by her report was 6.0.  Denies any family or personal history of DVT or pulmonary embolism.              ROS: All systems reviewed are negative as they relate to the chief complaint within the history of present illness.  Patient denies  fevers or chills.   Assessment & Plan: Visit Diagnoses:  1. Unilateral primary osteoarthritis, right knee     Plan: Impression is end-stage right knee arthritis in the 47 year old patient with slight valgus alignment diminishing range of motion.  This is a very difficult situation for [name].  She will likely need knee replacement at some time in the future.  She has global pain in the knee so I do not think she is a candidate for partial knee replacement.  I would like to inject that knee today and try her on tramadol.  If she is going to get a knee replacement and I have encouraged her to see the dentist to make sure that there are no tooth issues that would potentially come up after the knee has been replaced.  I will see her back in 8 weeks for clinical recheck and possible decision for or against knee replacement at that time  Follow-Up Instructions: Return in about 8 weeks (around 08/29/2017).    Orders:  No orders of the defined types were placed in this encounter.  Meds ordered this encounter  Medications  . traMADol (ULTRAM) 50 MG tablet    Sig: 1 po q 8 hrs prn    Dispense:  35 tablet    Refill:  0      Procedures: Large Joint Inj: R knee on 07/08/2017 12:02 AM Indications: diagnostic evaluation, joint swelling and pain Details: 18 G 1.5 in needle, superolateral approach  Arthrogram: No  Medications: 5 mL lidocaine 1 %; 40 mg methylPREDNISolone acetate 40 MG/ML; 4 mL bupivacaine 0.25 % Outcome: tolerated well, no immediate complications Procedure, treatment alternatives, risks and benefits explained, specific risks discussed. Consent was given by the patient. Immediately prior to procedure a time out was called to verify the correct patient, procedure, equipment, support staff and site/side marked as required. Patient was prepped and draped in the usual sterile fashion.       Clinical Data: No additional findings.  Objective: Vital Signs: There were no vitals taken for this visit.  Physical Exam:   Constitutional: Patient appears well-developed HEENT:  Head: Normocephalic Eyes:EOM are normal Neck: Normal range of motion Cardiovascular: Normal rate Pulmonary/chest: Effort normal Neurologic: Patient is alert Skin: Skin is warm Psychiatric: Patient has  normal mood and affect    Ortho Exam: Orthopedic exam demonstrates full active and passive range of motion of the left knee.  The right knee has slight valgus alignment.  Palpable pedal pulses present.  She has range of motion from about 0-100 degrees on the right knee with global pain and tenderness to palpation throughout the knee.  Patellofemoral crepitus is present.  Collateral and cruciate ligaments are stable.  Ankle dorsiflexion intact.  Specialty Comments:  No specialty comments available.  Imaging: No results found.   PMFS History: Patient Active Problem List   Diagnosis Date Noted  .  Migraine 10/02/2016  . Common migraine with intractable migraine 10/02/2016  . Type 2 diabetes mellitus without complication, without long-term current use of insulin (HCC) 08/17/2016   Past Medical History:  Diagnosis Date  . Anxiety   . Common migraine with intractable migraine 10/02/2016  . Depression   . Diabetes mellitus   . Hypertension     Family History  Problem Relation Age of Onset  . Diabetes Mother   . Hypertension Mother   . Renal Disease Mother   . Cancer Father     Past Surgical History:  Procedure Laterality Date  . BTL     Social History   Occupational History  . Not on file  Tobacco Use  . Smoking status: Current Every Day Smoker    Packs/day: 0.24    Types: Cigarettes  . Smokeless tobacco: Never Used  Substance and Sexual Activity  . Alcohol use: No  . Drug use: No  . Sexual activity: Yes    Birth control/protection: None

## 2017-07-08 DIAGNOSIS — M1711 Unilateral primary osteoarthritis, right knee: Secondary | ICD-10-CM | POA: Diagnosis not present

## 2017-07-08 MED ORDER — BUPIVACAINE HCL 0.25 % IJ SOLN
4.0000 mL | INTRAMUSCULAR | Status: AC | PRN
  Administered 2017-07-08: 4 mL via INTRA_ARTICULAR

## 2017-07-08 MED ORDER — LIDOCAINE HCL 1 % IJ SOLN
5.0000 mL | INTRAMUSCULAR | Status: AC | PRN
  Administered 2017-07-08: 5 mL

## 2017-07-08 MED ORDER — METHYLPREDNISOLONE ACETATE 40 MG/ML IJ SUSP
40.0000 mg | INTRAMUSCULAR | Status: AC | PRN
  Administered 2017-07-08: 40 mg via INTRA_ARTICULAR

## 2017-07-19 ENCOUNTER — Ambulatory Visit: Payer: Medicaid Other | Admitting: Obstetrics & Gynecology

## 2017-07-19 ENCOUNTER — Ambulatory Visit (HOSPITAL_COMMUNITY)
Admission: EM | Admit: 2017-07-19 | Discharge: 2017-07-19 | Disposition: A | Discharge status: Home / Self Care | Payer: Medicaid Other | Attending: Internal Medicine | Admitting: Internal Medicine

## 2017-07-19 ENCOUNTER — Encounter (HOSPITAL_BASED_OUTPATIENT_CLINIC_OR_DEPARTMENT_OTHER): Payer: Self-pay | Admitting: Obstetrics & Gynecology

## 2017-07-19 DIAGNOSIS — G43909 Migraine, unspecified, not intractable, without status migrainosus: Secondary | ICD-10-CM | POA: Diagnosis not present

## 2017-07-19 MED ORDER — DEXAMETHASONE SODIUM PHOSPHATE 10 MG/ML IJ SOLN
INTRAMUSCULAR | Status: AC
  Filled 2017-07-19: qty 1

## 2017-07-19 MED ORDER — METOCLOPRAMIDE HCL 5 MG/ML IJ SOLN
5.0000 mg | Freq: Once | INTRAMUSCULAR | Status: AC
  Administered 2017-07-19: 5 mg via INTRAMUSCULAR

## 2017-07-19 MED ORDER — KETOROLAC TROMETHAMINE 60 MG/2ML IM SOLN
INTRAMUSCULAR | Status: AC
  Filled 2017-07-19: qty 2

## 2017-07-19 MED ORDER — KETOROLAC TROMETHAMINE 60 MG/2ML IM SOLN
60.0000 mg | Freq: Once | INTRAMUSCULAR | Status: AC
  Administered 2017-07-19: 60 mg via INTRAMUSCULAR

## 2017-07-19 MED ORDER — METOCLOPRAMIDE HCL 5 MG/ML IJ SOLN
INTRAMUSCULAR | Status: AC
  Filled 2017-07-19: qty 2

## 2017-07-19 MED ORDER — DEXAMETHASONE SODIUM PHOSPHATE 10 MG/ML IJ SOLN
10.0000 mg | Freq: Once | INTRAMUSCULAR | Status: AC
  Administered 2017-07-19: 10 mg via INTRAMUSCULAR

## 2017-07-19 MED ORDER — SUMATRIPTAN SUCCINATE 6 MG/0.5ML ~~LOC~~ SOLN
SUBCUTANEOUS | Status: AC
  Filled 2017-07-19: qty 0.5

## 2017-07-19 MED ORDER — SUMATRIPTAN SUCCINATE 6 MG/0.5ML ~~LOC~~ SOLN
6.0000 mg | Freq: Once | SUBCUTANEOUS | Status: AC
  Administered 2017-07-19: 6 mg via SUBCUTANEOUS

## 2017-07-19 NOTE — ED Triage Notes (Signed)
Pt c/o headache x5 days, tried allergy med, tried otc pain medication without relief.

## 2017-07-19 NOTE — ED Provider Notes (Signed)
[facility]    CSN: [id number] Arrival date & time: 07/19/17  1237     History   Chief Complaint Chief Complaint  Patient presents with  . Headache    HPI [name] is a 47 y.o. female history of hypertension, diabetes mellitus, migraines presenting today with a headache.  Headache has been going on for approximately 5 days now.  States that it came on all of a sudden.  It has been associated with photophobia, phonophobia as well as nausea.  She has tried Claritin, Benadryl, Aleve, Tylenol, vinegar, sumatriptan, Topamax without relief.  He does endorse some tingling sensation in her right hand.  Denies difficulty speaking.  HPI  Past Medical History:  Diagnosis Date  . Anxiety   . Common migraine with intractable migraine 10/02/2016  . Depression   . Diabetes mellitus   . Hypertension     Patient Active Problem List   Diagnosis Date Noted  . Migraine 10/02/2016  . Common migraine with intractable migraine 10/02/2016  . Type 2 diabetes mellitus without complication, without long-term current use of insulin (HCC) 08/17/2016    Past Surgical History:  Procedure Laterality Date  . BTL      OB History    Gravida  5   Para  3   Term  3   Preterm      AB  2   Living  3     SAB  1   TAB  1   Ectopic      Multiple      Live Births               Home Medications    Prior to Admission medications   Medication Sig Start Date End Date Taking? Authorizing Provider  acetaminophen-codeine (TYLENOL #3) 300-30 MG tablet Take 1 tablet by mouth 2 times daily at 12 noon and 4 pm. 05/17/17   Loletta Specter, PA-C  amoxicillin-clavulanate (AUGMENTIN) 875-125 MG tablet Take 1 tablet by mouth 2 (two) times daily. Patient not taking: Reported on 05/21/2017 05/08/17   Loletta Specter, PA-C  aspirin EC 81 MG tablet Take 81 mg by mouth every 6 (six) hours as needed for mild pain.     [provider]  beclomethasone (QVAR) 40 MCG/ACT inhaler  Inhale 2 puffs into the lungs 2 (two) times daily.    [provider]  benzonatate (TESSALON) 200 MG capsule Take 1 capsule (200 mg total) by mouth 2 (two) times daily as needed for cough. 05/08/17   Loletta Specter, PA-C  cetirizine (ZYRTEC) 10 MG tablet Take 1 tablet (10 mg total) by mouth daily. 11/07/16   Loletta Specter, PA-C  citalopram (CELEXA) 40 MG tablet Take 1 tablet (40 mg total) by mouth daily. 01/11/17   Loletta Specter, PA-C  docusate sodium (COLACE) 100 MG capsule Take 100 mg by mouth daily.    [provider]  gabapentin (NEURONTIN) 100 MG capsule Take 1 capsule (100 mg total) by mouth 3 (three) times daily. 05/08/17   Loletta Specter, PA-C  glimepiride (AMARYL) 4 MG tablet Take 1 tablet (4 mg total) by mouth daily before breakfast. 06/18/17   Loletta Specter, PA-C  glucose blood (ACCU-CHEK AVIVA PLUS) test strip Use as instructed 08/17/16   Loletta Specter, PA-C  hydrochlorothiazide (HYDRODIURIL) 25 MG tablet Take 1 tablet (25 mg total) by mouth daily. 01/15/17   Loletta Specter, PA-C  insulin aspart (NOVOLOG) 100 UNIT/ML injection Inject 10  Units into the skin 3 (three) times daily before meals. 01/11/17   Loletta Specter, PA-C  insulin detemir (LEVEMIR) 100 UNIT/ML injection Inject 0.5 mLs (50 Units total) into the skin 2 (two) times daily. 01/11/17   Loletta Specter, PA-C  lactulose (CHRONULAC) 10 GM/15ML solution Take 30 mLs (20 g total) by mouth 2 (two) times daily as needed for mild constipation. 10/06/16   Loletta Specter, PA-C  Lancets [facility]) lancets Use as instructed 01/11/17   Loletta Specter, PA-C  megestrol (MEGACE) 40 MG tablet Take 1 tablet (40 mg total) by mouth 2 (two) times daily. 05/28/17   Allie Bossier, MD  metFORMIN (GLUCOPHAGE) 1000 MG tablet Take 1 tablet (1,000 mg total) by mouth 2 (two) times daily with a meal. 01/11/17   Loletta Specter, PA-C  metoCLOPramide (REGLAN) 5 MG tablet Take 1 tablet (5 mg  total) by mouth 3 (three) times daily before meals. 11/07/16   Loletta Specter, PA-C  omeprazole (PRILOSEC) 20 MG capsule Take 1 capsule (20 mg total) by mouth daily. 11/07/16   Loletta Specter, PA-C  pravastatin (PRAVACHOL) 10 MG tablet Take 1 tablet (10 mg total) by mouth daily. 01/11/17   Loletta Specter, PA-C  PROVENTIL HFA 108 [phone] Base) MCG/ACT inhaler Inhale 1-2 puffs into the lungs every 6 (six) hours as needed for wheezing or shortness of breath. 04/06/17   Loletta Specter, PA-C  sertraline (ZOLOFT) 100 MG tablet Take 1 tablet (100 mg total) by mouth daily. 04/06/17   Loletta Specter, PA-C  sulindac (CLINORIL) 150 MG tablet Take 1 tablet (150 mg total) by mouth 2 (two) times daily. 05/21/17   Levie Heritage, DO  SUMAtriptan (IMITREX) 100 MG tablet Take 1 tablet (100 mg total) by mouth 2 (two) times daily as needed for migraine. 06/18/17   Loletta Specter, PA-C  topiramate (TOPAMAX) 25 MG tablet 3 tablets at night for one week then take 4 tablets at night 06/18/17   Loletta Specter, PA-C  traMADol Janean Sark) 50 MG tablet 1 po q 8 hrs prn 07/04/17   Cammy Copa, MD    Family History Family History  Problem Relation Age of Onset  . Diabetes Mother   . Hypertension Mother   . Renal Disease Mother   . Cancer Father     Social History Social History   Tobacco Use  . Smoking status: Current Every Day Smoker    Packs/day: 0.24    Types: Cigarettes  . Smokeless tobacco: Never Used  Substance Use Topics  . Alcohol use: No  . Drug use: No     Allergies   Lisinopril   Review of Systems Review of Systems  Constitutional: Negative for fatigue and fever.  HENT: Negative for congestion and sore throat.   Eyes: Positive for photophobia. Negative for pain and visual disturbance.  Respiratory: Negative for cough and shortness of breath.   Cardiovascular: Negative for chest pain.  Gastrointestinal: Positive for nausea. Negative for constipation, diarrhea and  vomiting.  Skin: Negative for rash.  Neurological: Positive for numbness and headaches. Negative for dizziness, syncope, weakness and light-headedness.     Physical Exam Triage Vital Signs ED Triage Vitals [07/19/17 1337]  Enc Vitals Group     BP 123/85     Pulse Rate 65     Resp 16     Temp 98.1 F (36.7 C)     Temp src      SpO2  100 %     Weight      Height      Head Circumference      Peak Flow      Pain Score      Pain Loc      Pain Edu?      Excl. in GC?    No data found.  Updated Vital Signs BP 123/85   Pulse 65   Temp 98.1 F (36.7 C)   Resp 16   SpO2 100%   Visual Acuity Right Eye Distance:   Left Eye Distance:   Bilateral Distance:    Right Eye Near:   Left Eye Near:    Bilateral Near:     Physical Exam  Constitutional: She is oriented to person, place, and time. She appears well-developed and well-nourished. No distress.  HENT:  Head: Normocephalic and atraumatic.  Eyes: Pupils are equal, round, and reactive to light. Conjunctivae and EOM are normal.  Neck: Normal range of motion. Neck supple.  Cardiovascular: Normal rate and regular rhythm.  No murmur heard. Pulmonary/Chest: Effort normal and breath sounds normal. No respiratory distress.  Abdominal: Soft. There is no tenderness.  Musculoskeletal: She exhibits no edema.  Neurological: She is alert and oriented to person, place, and time. No cranial nerve deficit. GCS eye subscore is 4. GCS verbal subscore is 5. GCS motor subscore is 6.  Radial nerves II through XII grossly intact, strength 5/5 and equal bilaterally at shoulders, hips and knees.  Biceps and patellar reflexes 2+ bilaterally.  Patient with normal coordination and no ab normality with gait.  Normal mentation.  Normal finger to nose, rapid alternating movements, heel to shin.  Skin: Skin is warm and dry.  Psychiatric: She has a normal mood and affect.  Nursing note and vitals reviewed.    UC Treatments / Results  Labs (all labs  ordered are listed, but only abnormal results are displayed) Labs Reviewed - No data to display  EKG None Radiology No results found.  Procedures Procedures (including critical care time)  Medications Ordered in UC Medications  ketorolac (TORADOL) injection 60 mg (60 mg Intramuscular Given 07/19/17 1500)  metoCLOPramide (REGLAN) injection 5 mg (5 mg Intramuscular Given 07/19/17 1459)  dexamethasone (DECADRON) injection 10 mg (10 mg Intramuscular Given 07/19/17 1459)  SUMAtriptan (IMITREX) injection 6 mg (6 mg Subcutaneous Given 07/19/17 1459)     Initial Impression / Assessment and Plan / UC Course  I have reviewed the triage vital signs and the nursing notes.  Pertinent labs & imaging results that were available during my care of the patient were reviewed by me and considered in my medical decision making (see chart for details).     Patient with persistent migraine.  No focal neuro deficits.  Blood pressure stable.  Will provide patient with Toradol, Reglan, Decadron and sumatriptan.  Will send home to use anti-inflammatories at home.  Discussed signs and symptoms to go to emergency room.Discussed strict return precautions. Patient verbalized understanding and is agreeable with plan.   Final Clinical Impressions(s) / UC Diagnoses   Final diagnoses:  Migraine without status migrainosus, not intractable, unspecified migraine type    ED Discharge Orders    None       Controlled Substance Prescriptions [place] Controlled Substance Registry consulted? Not Applicable   Lew DawesWieters, Hallie C, [place]PA-C 07/19/17 1515

## 2017-07-19 NOTE — Discharge Instructions (Addendum)
We gave you a shot of Toradol, Decadron, Reglan and sumatriptan today, please should begin working in approximately 30-40 minutes.  Please continue to use over-the-counter anti-inflammatories for further headache management.  Please go to emergency room if symptoms persisting or developing any one-sided weakness, changes in vision, difficulty speaking or fainting.

## 2017-07-27 ENCOUNTER — Other Ambulatory Visit: Payer: Self-pay | Admitting: Obstetrics & Gynecology

## 2017-07-27 DIAGNOSIS — N939 Abnormal uterine and vaginal bleeding, unspecified: Secondary | ICD-10-CM

## 2017-07-27 NOTE — Progress Notes (Signed)
[name]  [name] [name]: [name] [name] [name] DOB: 10-01-1970   REASON FOR VISIT: Follow-up for [name] [name], [name] [name] [name], [name] [name] [name]    HISTORY OF PRESENT ILLNESS:UPDATE 4/22/2019CM [name] [name] [name], 47 year old female returns for follow-up with history of [name] [name] headaches that have been present over 2-1/2 years.  She reports that her headaches are daily in nature usually on awakening in the morning.  She has fatigue, daytime drowsiness.  She is morbidly obese.  She snores.  She reports that Topamax and Imitrex has not been that helpful.  Headaches typically start in the temporal regions and may be associated with nausea and vomiting photophobia and phonophobia.  She was in the emergency room on 07/19/17  for [name] [name] given , Toradol Reglan Decadron and sumatriptan without much benefit.  The [name] [name] also has tenderness of the scalp.  The headaches are not associated with any numbness or weakness of the face arms or legs.  She was diagnosed with obstructive sleep apnea in 2011 however does not use her machine because it is broken.  She never had a sleep doctor.  She did not have her MRI of the brain.  She returns for reevaluation 6/25/18KWMs. [name] [name] [name] is a 47 year old right-handed black female with a history of [name] [name] headaches that have been present over the last 2 years. The onset of the headaches apparently correlated with loss of her son. The [name] [name] indicates that she had increased stress around that time that has continued. The [name] [name] has been incarcerated in the recent past as well. She is applying for disability for multiple issues including her diabetes and degenerative arthritis and headaches. The [name] [name] indicates that her headaches are daily in nature, she cannot remember the last day without a [name] [name]. The headaches are in the temporal regions bilaterally and may be associated with some nausea and vomiting,  photophobia and phonophobia. The [name] [name] denies any neck stiffness. The [name] [name] has tenderness of the scalp as well. The headaches are not associated with any numbness or weakness of the face, arms, or legs. The [name] [name] does report some shortness of breath and fatigue. She may occasionally have some white spots in the vision. She has sleep apnea as well. The [name] [name] denies any family history of [name] [name]. She takes BC powders on a frequent basis, 3-4 packets every other day. The [name] [name] may also take Tylenol. She was put on low-dose Topamax taking 25 mg twice daily and low-dose Imitrex 25 mg daily which has not been extremely helpful. The [name] [name] is sent to this office for an evaluation. She denies any allergy symptoms   REVIEW OF SYSTEMS: Full 14 system review of systems performed and notable only for those listed, all others are neg:  Constitutional: neg  Cardiovascular: neg Ear/Nose/Throat: neg  Skin: neg Eyes: neg Respiratory: neg Gastroitestinal: neg  Hematology/Lymphatic: neg  Endocrine: neg Musculoskeletal:neg Allergy/Immunology: neg Neurological: Headaches Psychiatric: neg Sleep : Daytime drowsiness [name]   ALLERGIES: Allergies  Allergen Reactions  . Lisinopril     angioedema    HOME MEDICATIONS: Outpatient Medications Prior to Visit  Medication Sig Dispense Refill  . aspirin EC 81 MG tablet Take 81 mg by mouth every 6 (six) hours as needed for mild pain.     . beclomethasone (QVAR) 40 MCG/ACT inhaler Inhale 2 puffs into the lungs 2 (two) times daily.    . benzonatate (TESSALON) 200 MG capsule Take 1 capsule (200 mg total) by mouth 2 (two) times daily as needed for cough. 20  capsule 0  . cetirizine (ZYRTEC) 10 MG tablet Take 1 tablet (10 mg total) by mouth daily. 30 tablet 0  . citalopram (CELEXA) 40 MG tablet Take 1 tablet (40 mg total) by mouth daily. 30 tablet 3  . docusate sodium (COLACE) 100 MG capsule Take 100 mg by mouth daily as needed.     . gabapentin  (NEURONTIN) 100 MG capsule Take 1 capsule (100 mg total) by mouth 3 (three) times daily. 90 capsule 3  . glimepiride (AMARYL) 4 MG tablet Take 1 tablet (4 mg total) by mouth daily before breakfast. 90 tablet 1  . glucose blood (ACCU-CHEK AVIVA PLUS) test strip Use as instructed 100 each 12  . hydrochlorothiazide (HYDRODIURIL) 25 MG tablet Take 1 tablet (25 mg total) by mouth daily. 90 tablet 1  . insulin aspart (NOVOLOG) 100 UNIT/ML injection Inject 10 Units into the skin 3 (three) times daily before meals. 10 mL 11  . insulin detemir (LEVEMIR) 100 UNIT/ML injection Inject 0.5 mLs (50 Units total) into the skin 2 (two) times daily. 10 mL 11  . lactulose (CHRONULAC) 10 GM/15ML solution Take 30 mLs (20 g total) by mouth 2 (two) times daily as needed for mild constipation. 236 mL 3  . Lancets (ACCU-CHEK SOFT TOUCH) lancets Use as instructed 100 each 12  . megestrol (MEGACE) 40 MG tablet Take 1 tablet (40 mg total) by mouth 2 (two) times daily. 60 tablet 1  . metFORMIN (GLUCOPHAGE) 1000 MG tablet Take 1 tablet (1,000 mg total) by mouth 2 (two) times daily with a meal. 180 tablet 3  . metoCLOPramide (REGLAN) 5 MG tablet Take 1 tablet (5 mg total) by mouth 3 (three) times daily before meals. 45 tablet 0  . omeprazole (PRILOSEC) 20 MG capsule Take 1 capsule (20 mg total) by mouth daily. 30 capsule 0  . pravastatin (PRAVACHOL) 10 MG tablet Take 1 tablet (10 mg total) by mouth daily. 90 tablet 1  . PROVENTIL HFA 108 (90 Base) MCG/ACT inhaler Inhale 1-2 puffs into the lungs every 6 (six) hours as needed for wheezing or shortness of breath. 1 Inhaler 5  . sertraline (ZOLOFT) 100 MG tablet Take 1 tablet (100 mg total) by mouth daily. 90 tablet 0  . sulindac (CLINORIL) 150 MG tablet Take 1 tablet (150 mg total) by mouth 2 (two) times daily. 60 tablet 0  . SUMAtriptan (IMITREX) 100 MG tablet Take 1 tablet (100 mg total) by mouth 2 (two) times daily as needed for [name] [name]. 10 tablet 3  . topiramate (TOPAMAX) 25 MG  tablet 3 tablets at night for one week then take 4 tablets at night 120 tablet 3  . traMADol (ULTRAM) 50 MG tablet 1 po q 8 hrs prn 35 tablet 0  . acetaminophen-codeine (TYLENOL #3) 300-30 MG tablet Take 1 tablet by mouth 2 times daily at 12 noon and 4 pm. ([name] [name] not taking: Reported on 07/30/2017) 40 tablet 0  . amoxicillin-clavulanate (AUGMENTIN) 875-125 MG tablet Take 1 tablet by mouth 2 (two) times daily. ([name] [name] not taking: Reported on 07/30/2017) 20 tablet 0   No facility-administered medications prior to visit.     PAST MEDICAL HISTORY: Past Medical History:  Diagnosis Date  . Anxiety   . Common [name] [name] with intractable [name] [name] 10/02/2016  . Depression   . Diabetes mellitus   . Hypertension     PAST SURGICAL HISTORY: Past Surgical History:  Procedure Laterality Date  . BTL      FAMILY HISTORY: Family History  Problem Relation  Age of Onset  . Diabetes Mother   . Hypertension Mother   . Renal Disease Mother   . Cancer Father     SOCIAL HISTORY: Social History   Socioeconomic History  . Marital status: Single    Spouse name: Not on file  . Number of children: Not on file  . Years of education: Not on file  . Highest education level: Not on file  Occupational History  . Not on file  Social Needs  . Financial resource strain: Not on file  . Food insecurity:    Worry: Not on file    Inability: Not on file  . Transportation needs:    Medical: Not on file    Non-medical: Not on file  Tobacco Use  . Smoking status: Current Every Day Smoker    Packs/day: 0.24    Types: Cigarettes  . Smokeless tobacco: Never Used  Substance and Sexual Activity  . Alcohol use: No  . Drug use: No  . Sexual activity: Yes    Birth control/protection: None  Lifestyle  . Physical activity:    Days per week: Not on file    Minutes per session: Not on file  . Stress: Not on file  Relationships  . Social connections:    Talks on phone: Not on file    Gets together: Not on  file    Attends religious service: Not on file    Active member of club or organization: Not on file    Attends meetings of clubs or organizations: Not on file    Relationship status: Not on file  . Intimate partner violence:    Fear of current or ex partner: Not on file    Emotionally abused: Not on file    Physically abused: Not on file    Forced sexual activity: Not on file  Other Topics Concern  . Not on file  Social History Narrative   Right handed    Lives with daughter   Caffeine use: Drinks coffee/tea/soda sometimes     PHYSICAL EXAM  Vitals:   07/30/17 1433  BP: (!) 144/91  Pulse: 94  Weight: 248 lb (112.5 kg)  Height: 5' 5.5" (1.664 m)   Body mass index is 40.64 kg/m.  Generalized: Well developed, morbidly obese female in no acute distress  Head: normocephalic and atraumatic,. Oropharynx benign, mallopatti5  Neck: Supple, circumference 15 Musculoskeletal: No deformity   Neurological examination   Mentation: Alert oriented to time, place, history taking. Attention span and concentration appropriate. Recent and remote memory intact.  Follows all commands speech and language fluent. ESS 23  Cranial nerve II-XII: Pupils were equal round reactive to light extraocular movements were full, visual field were full on confrontational test. Facial sensation and strength were normal. hearing was intact to finger rubbing bilaterally. Uvula tongue midline. head turning and shoulder shrug were normal and symmetric.Tongue protrusion into cheek strength was normal. Motor: normal bulk and tone, full strength in the BUE, BLE, fine finger movements normal, no pronator drift. No focal weakness Sensory: normal and symmetric to light touch, pinprick, and  Vibration, in the upper and lower extremities Coordination: finger-nose-finger, heel-to-shin bilaterally, no dysmetria Reflexes: Symmetric upper and lower plantar responses were flexor bilaterally. Gait and Station: Rising up from  seated position without assistance, normal stance,  moderate stride, good arm swing, smooth turning, able to perform tiptoe, and heel walking without difficulty. Tandem gait is steady  DIAGNOSTIC DATA (LABS, IMAGING, TESTING) - I reviewed [name] [name] records, labs,  notes, testing and imaging myself where available.  Lab Results  Component Value Date   WBC 9.7 08/17/2016   HGB 12.9 08/17/2016   HCT 39.5 08/17/2016   MCV 76 (L) 08/17/2016   PLT 306 08/17/2016      Component Value Date/Time   NA 138 08/17/2016 1143   K 4.0 08/17/2016 1143   CL 102 08/17/2016 1143   CO2 22 08/17/2016 1143   GLUCOSE 218 (H) 08/17/2016 1143   GLUCOSE 353 (H) 05/29/2016 1812   BUN 7 08/17/2016 1143   CREATININE 0.66 08/17/2016 1143   CALCIUM 8.8 08/17/2016 1143   PROT 7.4 09/07/2016 1036   ALBUMIN 4.4 09/07/2016 1036   AST 27 09/07/2016 1036   ALT 35 (H) 09/07/2016 1036   ALKPHOS 131 (H) 09/07/2016 1036   BILITOT 0.2 09/07/2016 1036   GFRNONAA 105 08/17/2016 1143   GFRAA 121 08/17/2016 1143   Lab Results  Component Value Date   CHOL 159 08/17/2016   HDL 48 08/17/2016   LDLCALC 98 08/17/2016   TRIG 66 08/17/2016   CHOLHDL 3.3 08/17/2016   Lab Results  Component Value Date   HGBA1C 7.4 (H) 05/08/2017    Lab Results  Component Value Date   TSH 1.400 08/17/2016      ASSESSMENT AND PLAN  47 y.o. year old female  has a past medical history of Anxiety, Common [name] [name] with intractable [name] [name] (10/02/2016), Depression, Diabetes mellitus, and Hypertension. here to follow-up for her [name] [name].  She has [name] complaints of [name] morning [name] [name] fatigue daytime somnolence.  Sleep study in 2011 was positive for obstructive sleep apnea she has not used her machine in some time because it is broken.  She would qualify for a [name] sleep study .  PLAN: We will set up for sleep study due to excessive fatigue morning [name] [name], elevated BMI , [name], daytime drowsiness Continue Topamax and Imitrex for  now Stop any over-the-counter medications for [name] [name] I explained in particular the risks and ramifications of untreated moderate to severe OSA, especially with respect to cardiovascular disease  including congestive heart failure, difficult to treat hypertension, cardiac arrhythmias, or stroke. Even type 2 diabetes has, in part, been linked to untreated OSA. Symptoms of untreated OSA include daytime sleepiness, memory problems, mood irritability and mood disorder such as depression and anxiety, lack of energy, as well as recurrent headaches, especially morning headaches. We talked about trying to maintain a healthy lifestyle in general, as well as the importance of weight control. I encouraged the [name] [name] to eat healthy, exercise daily and keep well hydrated, to keep a scheduled bedtime and wake time routine, to not skip any meals and eat healthy snacks in between meals Follow up after sleep study I spent 25 minutes in total face to face time with the [name] [name] more than 50% of which was spent counseling and coordination of care, reviewing test results reviewing medications and discussing and reviewing the diagnosis of [name] [name] and untreated obstructive sleep apnea.  We reviewed the symptoms of untreated obstructive sleep apnea .  She will hold off on MRI of the brain for now [name] [name] [name], [facility], [facility], APRN  [facility] [address], [address] [place], [place] [address] [phone]

## 2017-07-30 ENCOUNTER — Encounter: Payer: Self-pay | Admitting: Nurse Practitioner

## 2017-07-30 ENCOUNTER — Ambulatory Visit: Payer: Medicaid Other | Admitting: Nurse Practitioner

## 2017-07-30 VITALS — BP 144/91 | HR 94 | Ht 65.5 in | Wt 248.0 lb

## 2017-07-30 DIAGNOSIS — G43019 Migraine without aura, intractable, without status migrainosus: Secondary | ICD-10-CM

## 2017-07-30 DIAGNOSIS — R0683 Snoring: Secondary | ICD-10-CM | POA: Insufficient documentation

## 2017-07-30 DIAGNOSIS — R400 Somnolence: Secondary | ICD-10-CM | POA: Diagnosis not present

## 2017-07-30 NOTE — Patient Instructions (Addendum)
We will set up for sleep study due to excessive fatigue morning headache, elevated BMI , snoring, daytime drowsiness Continue Topamax and Imitrex for now Stop any over-the-counter medications for headache I explained in particular the risks and ramifications of untreated moderate to severe OSA, especially with respect to cardiovascular disease  including congestive heart failure, difficult to treat hypertension, cardiac arrhythmias, or stroke. Even type 2 diabetes has, in part, been linked to untreated OSA. Symptoms of untreated OSA include daytime sleepiness, memory problems, mood irritability and mood disorder such as depression and anxiety, lack of energy, as well as recurrent headaches, especially morning headaches.

## 2017-08-01 ENCOUNTER — Encounter (INDEPENDENT_AMBULATORY_CARE_PROVIDER_SITE_OTHER): Payer: Self-pay | Admitting: Physician Assistant

## 2017-08-01 ENCOUNTER — Other Ambulatory Visit: Payer: Self-pay

## 2017-08-01 ENCOUNTER — Ambulatory Visit (INDEPENDENT_AMBULATORY_CARE_PROVIDER_SITE_OTHER): Payer: Medicaid Other | Admitting: Physician Assistant

## 2017-08-01 VITALS — BP 137/86 | HR 82 | Temp 97.9°F | Ht 65.5 in | Wt 246.6 lb

## 2017-08-01 DIAGNOSIS — E119 Type 2 diabetes mellitus without complications: Secondary | ICD-10-CM | POA: Diagnosis not present

## 2017-08-01 DIAGNOSIS — F411 Generalized anxiety disorder: Secondary | ICD-10-CM | POA: Diagnosis not present

## 2017-08-01 DIAGNOSIS — G4733 Obstructive sleep apnea (adult) (pediatric): Secondary | ICD-10-CM | POA: Diagnosis not present

## 2017-08-01 DIAGNOSIS — G43909 Migraine, unspecified, not intractable, without status migrainosus: Secondary | ICD-10-CM | POA: Diagnosis not present

## 2017-08-01 LAB — POCT GLYCOSYLATED HEMOGLOBIN (HGB A1C): HEMOGLOBIN A1C: 8.2

## 2017-08-01 MED ORDER — ESCITALOPRAM OXALATE 20 MG PO TABS: 20.0000 mg | ORAL_TABLET | Freq: Every day | ORAL | 5 refills | Status: DC

## 2017-08-01 MED ORDER — GABAPENTIN 300 MG PO CAPS: 300.0000 mg | ORAL_CAPSULE | Freq: Three times a day (TID) | ORAL | 3 refills | Status: DC

## 2017-08-01 MED ORDER — BUTALBITAL-APAP-CAFFEINE 50-300-40 MG PO CAPS
1.0000 | ORAL_CAPSULE | Freq: Three times a day (TID) | ORAL | 0 refills | Status: DC | PRN
Start: 2017-08-01 — End: 2017-08-01

## 2017-08-01 MED ORDER — BUTALBITAL-APAP-CAFFEINE 50-300-40 MG PO CAPS: 1.0000 | ORAL_CAPSULE | Freq: Three times a day (TID) | ORAL | 0 refills | Status: DC | PRN

## 2017-08-01 MED ORDER — CLONAZEPAM 1 MG PO TABS
1.0000 mg | ORAL_TABLET | Freq: Two times a day (BID) | ORAL | 0 refills | Status: DC | PRN
Start: 2017-08-01 — End: 2018-01-02

## 2017-08-01 NOTE — Patient Instructions (Signed)
Migraine Headache A migraine headache is a very strong throbbing pain on one side or both sides of your head. Migraines can also cause other symptoms. Talk with your doctor about what things may bring on (trigger) your migraine headaches. Follow these instructions at home: Medicines  Take over-the-counter and prescription medicines only as told by your doctor.  Do not drive or use heavy machinery while taking prescription pain medicine.  To prevent or treat constipation while you are taking prescription pain medicine, your doctor may recommend that you: ? Drink enough fluid to keep your pee (urine) clear or pale yellow. ? Take over-the-counter or prescription medicines. ? Eat foods that are high in fiber. These include fresh fruits and vegetables, whole grains, and beans. ? Limit foods that are high in fat and processed sugars. These include fried and sweet foods. Lifestyle  Avoid alcohol.  Do not use any products that contain nicotine or tobacco, such as cigarettes and e-cigarettes. If you need help quitting, ask your doctor.  Get at least 8 hours of sleep every night.  Limit your stress. General instructions   Keep a journal to find out what may bring on your migraines. For example, write down: ? What you eat and drink. ? How much sleep you get. ? Any change in what you eat or drink. ? Any change in your medicines.  If you have a migraine: ? Avoid things that make your symptoms worse, such as bright lights. ? It may help to lie down in a dark, quiet room. ? Do not drive or use heavy machinery. ? Ask your doctor what activities are safe for you.  Keep all follow-up visits as told by your doctor. This is important. Contact a doctor if:  You get a migraine that is different or worse than your usual migraines. Get help right away if:  Your migraine gets very bad.  You have a fever.  You have a stiff neck.  You have trouble seeing.  Your muscles feel weak or like you  cannot control them.  You start to lose your balance a lot.  You start to have trouble walking.  You pass out (faint). This information is not intended to replace advice given to you by your health care provider. Make sure you discuss any questions you have with your health care provider. Document Released: 01/04/2008 Document Revised: 10/15/2015 Document Reviewed: 09/13/2015 Elsevier Interactive Patient Education  2018 Elsevier Inc.   Sleep Apnea Sleep apnea is a condition that affects breathing. People with sleep apnea have moments during sleep when their breathing pauses briefly or gets shallow. Sleep apnea can cause these symptoms:  Trouble staying asleep.  Sleepiness or tiredness during the day.  Irritability.  Loud snoring.  Morning headaches.  Trouble concentrating.  Forgetting things.  Less interest in sex.  Being sleepy for no reason.  Mood swings.  Personality changes.  Depression.  Waking up a lot during the night to pee (urinate).  Dry mouth.  Sore throat.  Follow these instructions at home:  Make any changes in your routine that your doctor recommends.  Eat a healthy, well-balanced diet.  Take over-the-counter and prescription medicines only as told by your doctor.  Avoid using alcohol, calming medicines (sedatives), and narcotic medicines.  Take steps to lose weight if you are overweight.  If you were given a machine (device) to use while you sleep, use it only as told by your doctor.  Do not use any tobacco products, such as cigarettes, chewing  tobacco, and e-cigarettes. If you need help quitting, ask your doctor.  Keep all follow-up visits as told by your doctor. This is important. Contact a doctor if:  The machine that you were given to use during sleep is uncomfortable or does not seem to be working.  Your symptoms do not get better.  Your symptoms get worse. Get help right away if:  Your chest hurts.  You have trouble  breathing in enough air (shortness of breath).  You have an uncomfortable feeling in your back, arms, or stomach.  You have trouble talking.  One side of your body feels weak.  A part of your face is hanging down (drooping). These symptoms may be an emergency. Do not wait to see if the symptoms will go away. Get medical help right away. Call your local emergency services (911 in the [place] Do not drive yourself to the hospital. This information is not intended to replace advice given to you by your health care provider. Make sure you discuss any questions you have with your health care provider. Document Released: 01/04/2008 Document Revised: 11/21/2015 Document Reviewed: 01/04/2015 Elsevier Interactive Patient Education  [employer]2018 Elsevier Inc.

## 2017-08-01 NOTE — Progress Notes (Signed)
Subjective:  Patient ID: [name], female    DOB: 28-Aug-1970  Age: 47 y.o. MRN: [id number]  CC: DM f/u. Migraines.  HPI [name] a 47 y.o.femalewith a PMH of anxiety, depression, DM2, OSA, and HTN presents to f/u on diabetes and on ED visit for Migraines. ED visit 13 days ago. Complaint of headache and no relief with Claritin, Benadryl, Aleve, Tylenol, vinegar, sumatriptan, and Topamax. No labs or imaging conducted. Diagnosed with migraine. Treated with Toradol, Reglan, Decadron, and sumatriptan. Had previously been referred to neurology 10 months ago. Advised to take Topamax 100 mg qhs and Sumatriptan 100 mg one tablet twice daily prn. Also advised to abstain from caffeine and to f/u in three month. Pt did not f/u as directed but has recently seen another neurologist and advised to have another sleep study done. Has not heard from anyone to schedule a sleep study. Current headache associated with phonophobia, photophobia. Waxing and waning over four weeks.      Also here to f/u on DM2. Last A1c 7.4% three months ago. A1c 8.2% today. Reducing sweets but still eating carbs. She is not sure what foods may contain carbs.      Outpatient Medications Prior to Visit  Medication Sig Dispense Refill  . aspirin EC 81 MG tablet Take 81 mg by mouth every 6 (six) hours as needed for mild pain.     . beclomethasone (QVAR) 40 MCG/ACT inhaler Inhale 2 puffs into the lungs 2 (two) times daily.    . cetirizine (ZYRTEC) 10 MG tablet Take 1 tablet (10 mg total) by mouth daily. 30 tablet 0  . citalopram (CELEXA) 40 MG tablet Take 1 tablet (40 mg total) by mouth daily. 30 tablet 3  . docusate sodium (COLACE) 100 MG capsule Take 100 mg by mouth daily as needed.     . gabapentin (NEURONTIN) 100 MG capsule Take 1 capsule (100 mg total) by mouth 3 (three) times daily. 90 capsule 3  . glimepiride (AMARYL) 4 MG tablet Take 1 tablet (4 mg total) by mouth daily before breakfast. 90 tablet 1  . glucose  blood (ACCU-CHEK AVIVA PLUS) test strip Use as instructed 100 each 12  . hydrochlorothiazide (HYDRODIURIL) 25 MG tablet Take 1 tablet (25 mg total) by mouth daily. 90 tablet 1  . insulin aspart (NOVOLOG) 100 UNIT/ML injection Inject 10 Units into the skin 3 (three) times daily before meals. 10 mL 11  . insulin detemir (LEVEMIR) 100 UNIT/ML injection Inject 0.5 mLs (50 Units total) into the skin 2 (two) times daily. 10 mL 11  . lactulose (CHRONULAC) 10 GM/15ML solution Take 30 mLs (20 g total) by mouth 2 (two) times daily as needed for mild constipation. 236 mL 3  . Lancets (ACCU-CHEK SOFT TOUCH) lancets Use as instructed 100 each 12  . megestrol (MEGACE) 40 MG tablet Take 1 tablet (40 mg total) by mouth 2 (two) times daily. 60 tablet 1  . metFORMIN (GLUCOPHAGE) 1000 MG tablet Take 1 tablet (1,000 mg total) by mouth 2 (two) times daily with a meal. 180 tablet 3  . metoCLOPramide (REGLAN) 5 MG tablet Take 1 tablet (5 mg total) by mouth 3 (three) times daily before meals. 45 tablet 0  . PROVENTIL HFA 108 (90 Base) MCG/ACT inhaler Inhale 1-2 puffs into the lungs every 6 (six) hours as needed for wheezing or shortness of breath. 1 Inhaler 5  . sertraline (ZOLOFT) 100 MG tablet Take 1 tablet (100 mg total) by mouth daily. 90 tablet 0  .  SUMAtriptan (IMITREX) 100 MG tablet Take 1 tablet (100 mg total) by mouth 2 (two) times daily as needed for migraine. 10 tablet 3  . topiramate (TOPAMAX) 25 MG tablet 3 tablets at night for one week then take 4 tablets at night 120 tablet 3  . benzonatate (TESSALON) 200 MG capsule Take 1 capsule (200 mg total) by mouth 2 (two) times daily as needed for cough. 20 capsule 0  . omeprazole (PRILOSEC) 20 MG capsule Take 1 capsule (20 mg total) by mouth daily. 30 capsule 0  . pravastatin (PRAVACHOL) 10 MG tablet Take 1 tablet (10 mg total) by mouth daily. 90 tablet 1  . sulindac (CLINORIL) 150 MG tablet Take 1 tablet (150 mg total) by mouth 2 (two) times daily. 60 tablet 0  .  traMADol (ULTRAM) 50 MG tablet 1 po q 8 hrs prn 35 tablet 0   No facility-administered medications prior to visit.      ROS Review of Systems  Constitutional: Negative for chills, fever and malaise/fatigue.  Eyes: Negative for blurred vision.  Respiratory: Negative for shortness of breath.   Cardiovascular: Negative for chest pain and palpitations.  Gastrointestinal: Negative for abdominal pain and nausea.  Genitourinary: Negative for dysuria and hematuria.  Musculoskeletal: Negative for joint pain and myalgias.  Skin: Negative for rash.  Neurological: Positive for headaches. Negative for tingling.  Psychiatric/Behavioral: Negative for depression. The patient is nervous/anxious.     Objective:  BP 137/86 (BP Location: Left Arm, Patient Position: Sitting, Cuff Size: Large)   Pulse 82   Temp 97.9 F (36.6 C) (Oral)   Ht 5' 5.5" (1.664 m)   Wt 246 lb 9.6 oz (111.9 kg)   LMP 05/17/2017 (Approximate)   SpO2 97%   BMI 40.41 kg/m   BP/Weight 08/01/2017 07/30/2017 07/19/2017  Systolic BP 137 144 123  Diastolic BP 86 91 85  Wt. (Lbs) 246.6 248 -  BMI 40.41 40.64 -      Physical Exam  Constitutional: She is oriented to person, place, and time.  Well developed, obese, laying on exam table covering eyes, polite  HENT:  Head: Normocephalic and atraumatic.  Eyes: Pupils are equal, round, and reactive to light. Conjunctivae are normal. No scleral icterus.  Neck: Normal range of motion. Neck supple.  Cardiovascular: Normal rate, regular rhythm and normal heart sounds.  Pulmonary/Chest: Effort normal and breath sounds normal.  Musculoskeletal: She exhibits no edema.  Lymphadenopathy:    She has no cervical adenopathy.  Neurological: She is alert and oriented to person, place, and time.  Skin: Skin is warm and dry. No rash noted. No erythema. No pallor.  Psychiatric: She has a normal mood and affect. Her behavior is normal. Thought content normal.  Vitals  reviewed.    Assessment & Plan:   1. Type 2 diabetes mellitus without complication, without long-term current use of insulin (HCC) - HgB A1c 8.2% - Referral to Nutrition and Diabetes Services  2. Migraine without status migrainosus, not intractable, unspecified migraine type - CT Head Wo Contrast; Future - Butalbital-APAP-Caffeine (FIORICET) 50-300-40 MG CAPS; Take 1 capsule by mouth 3 (three) times daily as needed.  Dispense: 60 capsule; Refill: 0 - gabapentin (NEURONTIN) 300 MG capsule; Take 1 capsule (300 mg total) by mouth 3 (three) times daily.  Dispense: 90 capsule; Refill: 3  3. Generalized anxiety disorder - escitalopram (LEXAPRO) 20 MG tablet; Take 1 tablet (20 mg total) by mouth daily.  Dispense: 30 tablet; Refill: 5 - clonazePAM (KLONOPIN) 1 MG tablet; Take  1 tablet (1 mg total) by mouth 2 (two) times daily as needed for anxiety.  Dispense: 30 tablet; Refill: 0  4. OSA (obstructive sleep apnea) - Cpap titration; Future   Meds ordered this encounter  Medications  . Butalbital-APAP-Caffeine (FIORICET) 50-300-40 MG CAPS    Sig: Take 1 capsule by mouth 3 (three) times daily as needed.    Dispense:  60 capsule    Refill:  0    Order Specific Question:   Supervising Provider    Answer:   Quentin AngstJEGEDE, OLUGBEMIGA E [id number]  . escitalopram (LEXAPRO) 20 MG tablet    Sig: Take 1 tablet (20 mg total) by mouth daily.    Dispense:  30 tablet    Refill:  5    Order Specific Question:   Supervising Provider    Answer:   Quentin AngstJEGEDE, OLUGBEMIGA E [id number]  . clonazePAM (KLONOPIN) 1 MG tablet    Sig: Take 1 tablet (1 mg total) by mouth 2 (two) times daily as needed for anxiety.    Dispense:  30 tablet    Refill:  0    Order Specific Question:   Supervising Provider    Answer:   Quentin AngstJEGEDE, OLUGBEMIGA E [id number]  . gabapentin (NEURONTIN) 300 MG capsule    Sig: Take 1 capsule (300 mg total) by mouth 3 (three) times daily.    Dispense:  90 capsule    Refill:  3    Order Specific Question:    Supervising Provider    Answer:   Quentin AngstJEGEDE, OLUGBEMIGA E [id number]    Follow-up: Return in about 1 month (around 08/29/2017) for anxiety and HA.   Loletta Specteroger David Gomez PA

## 2017-08-03 ENCOUNTER — Telehealth (INDEPENDENT_AMBULATORY_CARE_PROVIDER_SITE_OTHER): Payer: Self-pay | Admitting: Physician Assistant

## 2017-08-03 NOTE — Telephone Encounter (Signed)
[facility] called to request a previous sleep study to conduct the CPAP titration. since there is no previous sleep study on file they don't know if the patient is currently using a CPAP machine. The order should be a  split night.Please follow up PHONE:[phone]  FAX:[phone]

## 2017-08-03 NOTE — Telephone Encounter (Signed)
Pt is using a CPAP machine that is reportedly too old.

## 2017-08-07 ENCOUNTER — Other Ambulatory Visit (INDEPENDENT_AMBULATORY_CARE_PROVIDER_SITE_OTHER): Payer: Self-pay | Admitting: Physician Assistant

## 2017-08-07 DIAGNOSIS — G4733 Obstructive sleep apnea (adult) (pediatric): Secondary | ICD-10-CM

## 2017-08-07 NOTE — Telephone Encounter (Signed)
Sleep Center states that since there is not a sleep study on file for patient [name] provider needs to order a split night study ( [id number]). Maryjean Morn, CMA

## 2017-08-07 NOTE — Telephone Encounter (Signed)
Split night is ordered. Let patient know she should call [facility] if she has not heard from them in the next couple of days. Thanks for investigating.

## 2017-08-08 ENCOUNTER — Ambulatory Visit (HOSPITAL_COMMUNITY)
Admission: RE | Admit: 2017-08-08 | Discharge: 2017-08-08 | Disposition: A | Discharge status: Home / Self Care | Payer: Medicaid Other | Source: Ambulatory Visit | Attending: Physician Assistant | Admitting: Physician Assistant

## 2017-08-08 DIAGNOSIS — G43909 Migraine, unspecified, not intractable, without status migrainosus: Secondary | ICD-10-CM | POA: Insufficient documentation

## 2017-08-08 NOTE — Telephone Encounter (Signed)
Patient aware. [name], CMA  

## 2017-08-09 ENCOUNTER — Telehealth (INDEPENDENT_AMBULATORY_CARE_PROVIDER_SITE_OTHER): Payer: Self-pay | Admitting: Physician Assistant

## 2017-08-09 ENCOUNTER — Other Ambulatory Visit (INDEPENDENT_AMBULATORY_CARE_PROVIDER_SITE_OTHER): Payer: Self-pay | Admitting: Physician Assistant

## 2017-08-09 DIAGNOSIS — E236 Other disorders of pituitary gland: Secondary | ICD-10-CM

## 2017-08-09 NOTE — Telephone Encounter (Signed)
Pt called to request her CT head scan results, preformed on 08/08/2017. Please follow up

## 2017-08-10 ENCOUNTER — Telehealth (INDEPENDENT_AMBULATORY_CARE_PROVIDER_SITE_OTHER): Payer: Self-pay

## 2017-08-10 NOTE — Telephone Encounter (Signed)
Patient is aware of CT results showing no acute abnormality, only incidental findings of an empty sella, possibly due to idiopathic intracranial hypertension. Referral placed to neurology to further investigate. Go to ED if headaches persist or worsen. [name], CMA

## 2017-08-10 NOTE — Telephone Encounter (Signed)
-----   Message from Loletta Specter, PA-C sent at 08/09/2017  5:25 PM EDT ----- Does not seem to have an acute abnormality. Incidental finding of empty sella which may be due to idiopathic intracranial hypertension. I will make referral to neurology to investigate further. May go to ED if headaches persist or worsen.

## 2017-08-14 ENCOUNTER — Ambulatory Visit: Payer: Medicaid Other | Admitting: Neurology

## 2017-08-14 ENCOUNTER — Other Ambulatory Visit: Payer: Self-pay

## 2017-08-14 ENCOUNTER — Encounter: Payer: Self-pay | Admitting: Neurology

## 2017-08-14 VITALS — BP 133/80 | HR 85 | Ht 65.5 in | Wt 249.0 lb

## 2017-08-14 DIAGNOSIS — G43019 Migraine without aura, intractable, without status migrainosus: Secondary | ICD-10-CM

## 2017-08-14 DIAGNOSIS — G43811 Other migraine, intractable, with status migrainosus: Secondary | ICD-10-CM

## 2017-08-14 MED ORDER — NORTRIPTYLINE HCL 10 MG PO CAPS: ORAL_CAPSULE | ORAL | 3 refills | Status: DC

## 2017-08-14 NOTE — Progress Notes (Signed)
Reason for visit: Intractable headache  [name] is an 48 y.o. female  History of present illness:  [name] is a 47 year old right-handed white female with a history of intractable headache.  The patient is having daily headaches, she does not remember the last day she had without a headache.  The headaches are throughout the head, there is minimal neck stiffness.  The patient does have photophobia and phonophobia with a headache, and the scalp is sore with the headache.  She may have some nausea without vomiting.  She does not report any problems with muffled hearing, she may have some blurring of vision at times.  She does have episodes of shortness of breath, she has been told by her primary care physician that she was having anxiety episodes.  The patient has been given a trial on Topamax without benefit, she has recently been placed on gabapentin taking 300 mg 3 times daily.  The patient is on Lexapro as well.  She is now taking Excedrin Migraine regularly, she was taking BC powders, and she has recently been given Fioricet to take for her headache.  The patient also takes Tylenol.  She has Imitrex 100 mg tablets to take if needed.  So far, nothing has helped her headache.  She returns for an evaluation.  She will be getting a reevaluation for her CPAP machine in the near future.  Past Medical History:  Diagnosis Date  . Anxiety   . Common migraine with intractable migraine 10/02/2016  . Depression   . Diabetes mellitus   . Hypertension     Past Surgical History:  Procedure Laterality Date  . BTL      Family History  Problem Relation Age of Onset  . Diabetes Mother   . Hypertension Mother   . Renal Disease Mother   . Cancer Father     Social history:  reports that she has been smoking cigarettes.  She has been smoking about 0.24 packs per day. She has never used smokeless tobacco. She reports that she does not drink alcohol or use drugs.    Allergies  Allergen  Reactions  . Lisinopril     angioedema    Medications:  Prior to Admission medications   Medication Sig Start Date End Date Taking? Authorizing Provider  aspirin EC 81 MG tablet Take 81 mg by mouth every 6 (six) hours as needed for mild pain.    Yes [provider]  beclomethasone (QVAR) 40 MCG/ACT inhaler Inhale 2 puffs into the lungs 2 (two) times daily.   Yes [provider]  Butalbital-APAP-Caffeine (FIORICET) 50-300-40 MG CAPS Take 1 capsule by mouth 3 (three) times daily as needed. 08/01/17  Yes Loletta Specter, PA-C  cetirizine (ZYRTEC) 10 MG tablet Take 1 tablet (10 mg total) by mouth daily. 11/07/16  Yes Loletta Specter, PA-C  clonazePAM (KLONOPIN) 1 MG tablet Take 1 tablet (1 mg total) by mouth 2 (two) times daily as needed for anxiety. 08/01/17  Yes Loletta Specter, PA-C  docusate sodium (COLACE) 100 MG capsule Take 100 mg by mouth daily as needed.    Yes [provider]  escitalopram (LEXAPRO) 20 MG tablet Take 1 tablet (20 mg total) by mouth daily. 08/01/17  Yes Loletta Specter, PA-C  gabapentin (NEURONTIN) 300 MG capsule Take 1 capsule (300 mg total) by mouth 3 (three) times daily. 08/01/17  Yes Loletta Specter, PA-C  glimepiride (AMARYL) 4 MG tablet Take 1 tablet (4 mg total)  by mouth daily before breakfast. 06/18/17  Yes Loletta Specter, PA-C  glucose blood (ACCU-CHEK AVIVA PLUS) test strip Use as instructed 08/17/16  Yes Loletta Specter, PA-C  hydrochlorothiazide (HYDRODIURIL) 25 MG tablet Take 1 tablet (25 mg total) by mouth daily. 01/15/17  Yes Loletta Specter, PA-C  insulin aspart (NOVOLOG) 100 UNIT/ML injection Inject 10 Units into the skin 3 (three) times daily before meals. 01/11/17  Yes Loletta Specter, PA-C  insulin detemir (LEVEMIR) 100 UNIT/ML injection Inject 0.5 mLs (50 Units total) into the skin 2 (two) times daily. 01/11/17  Yes Loletta Specter, PA-C  lactulose [facility]) 10 GM/15ML solution Take 30 mLs (20 g total)  by mouth 2 (two) times daily as needed for mild constipation. 10/06/16  Yes Loletta Specter, PA-C  Lancets [facility]) lancets Use as instructed 01/11/17  Yes Loletta Specter, PA-C  megestrol (MEGACE) 40 MG tablet Take 1 tablet (40 mg total) by mouth 2 (two) times daily. 07/30/17  Yes Allie Bossier, MD  metFORMIN (GLUCOPHAGE) 1000 MG tablet Take 1 tablet (1,000 mg total) by mouth 2 (two) times daily with a meal. 01/11/17  Yes Loletta Specter, PA-C  metoCLOPramide (REGLAN) 5 MG tablet Take 1 tablet (5 mg total) by mouth 3 (three) times daily before meals. 11/07/16  Yes Loletta Specter, PA-C  PROVENTIL HFA 108 [phone] Base) MCG/ACT inhaler Inhale 1-2 puffs into the lungs every 6 (six) hours as needed for wheezing or shortness of breath. 04/06/17  Yes Loletta Specter, PA-C    ROS:  Out of a complete 14 system review of symptoms, the patient complains only of the following symptoms, and all other reviewed systems are negative.  Blurred vision Eye pain Shortness of breath, wheezing Feeling hot, cold, increased thirst, flushing Allergies Memory loss, confusion, headache, weakness, dizziness Depression, anxiety, too much sleep, change in appetite Sleepiness, snoring, restless legs  Blood pressure 133/80, pulse 85, height 5' 5.5" (1.664 m), weight 249 lb (112.9 kg).  Physical Exam  General: The patient is alert and cooperative at the time of the examination.  The patient is markedly obese.  Skin: No significant peripheral edema is noted.   Neurologic Exam  Mental status: The patient is alert and oriented x 3 at the time of the examination. The patient has apparent normal recent and remote memory, with an apparently normal attention span and concentration ability.   Cranial nerves: Facial symmetry is present. Speech is normal, no aphasia or dysarthria is noted. Extraocular movements are full. Visual fields are full.  Pupils are equal, round, and reactive to light.  Discs  appear to have some mild blurring of the margins medially, no venous pulsations are seen.  Motor: The patient has good strength in all 4 extremities.  Sensory examination: Soft touch sensation is symmetric on the face, arms, and legs.  Coordination: The patient has good finger-nose-finger and heel-to-shin bilaterally.  Gait and station: The patient has a normal gait. Tandem gait is normal. Romberg is negative. No drift is seen.  Reflexes: Deep tendon reflexes are symmetric.   CT head 08/08/17:  IMPRESSION: 1. No evidence of acute intracranial abnormality. 2. Partially empty sella, typically an incidental finding though can be seen with idiopathic intracranial hypertension.  * CT scan images were reviewed online. I agree with the written report.    Assessment/Plan:  1.  Chronic daily headache, intractable migraine  2.  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  The patient has already been on Topamax which treats this condition and she did not get benefit from the medication.  The patient is to stop the Fioricet and the Excedrin Migraine as these products contain caffeine and may result in rebound headaches.  The patient has Imitrex to take if needed, I have recommended the use of Advil primarily for treatment of the headache.  The patient will be placed on nortriptyline, dose will be increased gradually over time, she will call for any dose adjustments.  In the future, the patient may be a candidate for Botox or Aimovig therapy if the headaches do not abate.  She will follow-up in 3 months.  Marlan Palau MD 08/14/2017 8:36 AM  [facility] [address] [place], [place] [address]  Phone [phone] Fax [phone]

## 2017-08-14 NOTE — Patient Instructions (Signed)
We will get a spinal tap to look at the spinal fluid pressure.  We will start Nortriptyline for the headache.   Pamelor (nortriptyline) is an antidepressant medication that has many uses that may include headache, whiplash injuries, or for peripheral neuropathy pain. Side effects may include drowsiness, dry mouth, blurred vision, or constipation. As with any antidepressant medication, worsening depression may occur. If you had any significant side effects, please call our office. The full effects of this medication may take 7-10 days after starting the drug, or going up on the dose.

## 2017-08-16 ENCOUNTER — Encounter: Payer: Self-pay | Admitting: Neurology

## 2017-08-16 ENCOUNTER — Telehealth: Payer: Self-pay | Admitting: Neurology

## 2017-08-16 ENCOUNTER — Telehealth (INDEPENDENT_AMBULATORY_CARE_PROVIDER_SITE_OTHER): Payer: Self-pay

## 2017-08-16 NOTE — Telephone Encounter (Signed)
Patient called requesting a letter stating that she is not good health to be traveling. Patient has an upcoming court date and suffers from re-occuring headaches. Patient is scheduled for court on 5/16 and a spinal tap on 5/17. Please advise. [name], CMA

## 2017-08-16 NOTE — Telephone Encounter (Signed)
I am unable to provide a letter stating she can not travel. She can speak with her neurologist regarding this if she would like.

## 2017-08-16 NOTE — Telephone Encounter (Signed)
I called the patient.  The patient is to go out of state to [place] to testify in a court case, she does not want to do this as she is feeling bad with the headaches.  The patient wants a letter for her attorney indicating that she will not be able to travel for a couple months.  I will dictate a note.  The patient will be having a spinal tap in the near future.

## 2017-08-16 NOTE — Telephone Encounter (Signed)
Pt has called asking for Dr Anne Hahn to call her re: continually having head aches and she is asking to speak with him about a future appointment that she has at another office

## 2017-08-17 NOTE — Telephone Encounter (Signed)
Called pt back. Advised we need signed release form to be able to fax letter to attorney. She states she is unable to come today. She will come Monday to sign. She also requested copy be mailed to her. I placed this in the mail and placed form to be signed up front.

## 2017-08-17 NOTE — Telephone Encounter (Signed)
Pt has called back asking if this letter prepared by Dr Anne Hahn can be faxed to her attorney at [phone] attention Thomasena Edis.  Pt is also asking if a copy of the letter can be mailed to her as well.

## 2017-08-17 NOTE — Telephone Encounter (Signed)
Patient informed that letter will not be written by covering provider. Patient instructed to contact neurologist. She stated that the neurologist did write the letter for her. Maryjean Morn, CMA

## 2017-08-22 ENCOUNTER — Other Ambulatory Visit (INDEPENDENT_AMBULATORY_CARE_PROVIDER_SITE_OTHER): Payer: Self-pay | Admitting: Physician Assistant

## 2017-08-22 DIAGNOSIS — E119 Type 2 diabetes mellitus without complications: Secondary | ICD-10-CM

## 2017-08-22 NOTE — Telephone Encounter (Signed)
FWD to covering provider at [facility]. Gabriana Wilmott S Bess Saltzman, CMA  

## 2017-08-24 ENCOUNTER — Telehealth (INDEPENDENT_AMBULATORY_CARE_PROVIDER_SITE_OTHER): Payer: Self-pay

## 2017-08-24 ENCOUNTER — Inpatient Hospital Stay: Admission: RE | Admit: 2017-08-24 | Payer: Medicaid Other | Source: Ambulatory Visit

## 2017-08-24 NOTE — Telephone Encounter (Signed)
Patients pharmacy called stating that the levemir will only last 10 days requesting a change to levemir flex pen. Please send to [facility]. Maryjean Morn, CMA

## 2017-08-27 ENCOUNTER — Other Ambulatory Visit: Payer: Self-pay | Admitting: Nurse Practitioner

## 2017-08-27 MED ORDER — INSULIN PEN NEEDLE 31G X 5 MM MISC
1 refills | Status: DC
Start: 2017-08-27 — End: 2017-11-06

## 2017-08-27 MED ORDER — INSULIN DETEMIR 100 UNIT/ML FLEXPEN: 50.0000 [IU] | Freq: Two times a day (BID) | SUBCUTANEOUS | 2 refills | Status: DC

## 2017-08-27 MED ORDER — INSULIN DETEMIR 100 UNIT/ML FLEXPEN: 50.0000 [IU] | PEN_INJECTOR | Freq: Two times a day (BID) | SUBCUTANEOUS | 11 refills | Status: DC

## 2017-08-27 NOTE — Telephone Encounter (Signed)
Order has been changed to levemir flex pen

## 2017-08-29 ENCOUNTER — Ambulatory Visit (INDEPENDENT_AMBULATORY_CARE_PROVIDER_SITE_OTHER): Payer: Medicaid Other | Admitting: Orthopedic Surgery

## 2017-08-31 ENCOUNTER — Ambulatory Visit
Admission: RE | Admit: 2017-08-31 | Discharge: 2017-08-31 | Disposition: A | Discharge status: Home / Self Care | Payer: Medicaid Other | Source: Ambulatory Visit | Attending: Neurology | Admitting: Neurology

## 2017-08-31 VITALS — BP 184/84 | HR 77

## 2017-08-31 DIAGNOSIS — G43019 Migraine without aura, intractable, without status migrainosus: Secondary | ICD-10-CM

## 2017-08-31 LAB — CSF CELL COUNT WITH DIFFERENTIAL
RBC COUNT CSF: 3 {cells}/uL (ref 0–10)
WBC CSF: 0 {cells}/uL (ref 0–5)

## 2017-08-31 LAB — GLUCOSE, CSF: Glucose, CSF: 79 mg/dL (ref 40–80)

## 2017-08-31 LAB — PROTEIN, CSF: TOTAL PROTEIN, CSF: 38 mg/dL (ref 15–45)

## 2017-08-31 NOTE — Discharge Instructions (Signed)

## 2017-09-03 ENCOUNTER — Telehealth: Payer: Self-pay | Admitting: Neurology

## 2017-09-03 MED ORDER — ACETAZOLAMIDE 250 MG PO TABS: ORAL_TABLET | ORAL | 3 refills | Status: DC

## 2017-09-03 NOTE — Telephone Encounter (Signed)
I called the patient.  The spinal fluid opening pressure was 290, the patient may have pseudotumor cerebri, we will start Diamox.  Spinal fluid itself was normal.

## 2017-09-04 ENCOUNTER — Encounter: Payer: Medicaid Other | Admitting: Dietician

## 2017-09-05 ENCOUNTER — Ambulatory Visit (HOSPITAL_BASED_OUTPATIENT_CLINIC_OR_DEPARTMENT_OTHER): Payer: Medicaid Other | Attending: Physician Assistant | Admitting: Internal Medicine

## 2017-09-05 VITALS — Ht 65.5 in | Wt 250.0 lb

## 2017-09-05 DIAGNOSIS — G4733 Obstructive sleep apnea (adult) (pediatric): Secondary | ICD-10-CM | POA: Insufficient documentation

## 2017-09-06 ENCOUNTER — Encounter (INDEPENDENT_AMBULATORY_CARE_PROVIDER_SITE_OTHER): Payer: Self-pay | Admitting: Orthopedic Surgery

## 2017-09-06 ENCOUNTER — Ambulatory Visit (INDEPENDENT_AMBULATORY_CARE_PROVIDER_SITE_OTHER): Payer: Medicaid Other | Admitting: Orthopedic Surgery

## 2017-09-06 DIAGNOSIS — M1711 Unilateral primary osteoarthritis, right knee: Secondary | ICD-10-CM | POA: Diagnosis not present

## 2017-09-08 ENCOUNTER — Encounter (INDEPENDENT_AMBULATORY_CARE_PROVIDER_SITE_OTHER): Payer: Self-pay | Admitting: Orthopedic Surgery

## 2017-09-08 NOTE — Progress Notes (Signed)
Office Visit Note   Patient: [name]           Date of Birth: 1970-10-16           MRN: [id number] Visit Date: 09/06/2017 Requested by: [name] Specter, PA-C [address] [place], [place] [address] PCP: Denny Levy  Subjective: Chief Complaint  Patient presents with  . Right Knee - Follow-up    HPI: Satina is a patient with right knee arthritis.  Injection 07/04/2017 only gave her short relief.  She is 47 years old with essentially end-stage arthritis in her knee.  Quality of life has been affected.  She is taking tramadol for her symptoms.  She was recommended to see the dentist about any type of work the need to be done which would preferably be done before any type of knee replacement.  She has not done that yet.              ROS: All systems reviewed are negative as they relate to the chief complaint within the history of present illness.  Patient denies  fevers or chills.   Assessment & Plan: Visit Diagnoses:  1. Unilateral primary osteoarthritis, right knee     Plan: Impression is end-stage arthritis in patient with high BMI.  She weighs 250 at height of 5 5.  If she could lose about 30 pounds I would get her closer to a BMI of 40.  She also needs to see the dentist about getting teeth pulled.  Would rather have that done before rather than after knee replacement.  We will get her a hinged knee brace.  We talked about gel injection but that is not covered by her insurance.  I will see her back in 3 months for recheck on weight loss and dental preoperative evaluation  Follow-Up Instructions: Return in about 3 months (around 12/07/2017).   Orders:  No orders of the defined types were placed in this encounter.  No orders of the defined types were placed in this encounter.     Procedures: No procedures performed   Clinical Data: No additional findings.  Objective: Vital Signs: There were no vitals taken for this visit.  Physical Exam:    Constitutional: Patient appears well-developed HEENT:  Head: Normocephalic Eyes:EOM are normal Neck: Normal range of motion Cardiovascular: Normal rate Pulmonary/chest: Effort normal Neurologic: Patient is alert Skin: Skin is warm Psychiatric: Patient has normal mood and affect    Ortho Exam: Orthopedic exam demonstrates full active and passive range of motion of the left knee.  Right knee demonstrates valgus alignment with palpable pedal pulses intact extensor mechanism does have medial and lateral joint space tenderness and joint line tenderness.  Extensor mechanism is intact.  No other masses lymph adenopathy or skin changes noted in that right knee region  Specialty Comments:  No specialty comments available.  Imaging: No results found.   PMFS History: Patient Active Problem List   Diagnosis Date Noted  . Somnolence, daytime 07/30/2017  . Snoring 07/30/2017  . Morbid obesity (HCC) 07/30/2017  . Migraine 10/02/2016  . Common migraine with intractable migraine 10/02/2016  . Type 2 diabetes mellitus without complication, without long-term current use of insulin (HCC) 08/17/2016   Past Medical History:  Diagnosis Date  . Anxiety   . Common migraine with intractable migraine 10/02/2016  . Depression   . Diabetes mellitus   . Hypertension     Family History  Problem Relation Age of Onset  . Diabetes  Mother   . Hypertension Mother   . Renal Disease Mother   . Cancer Father     Past Surgical History:  Procedure Laterality Date  . BTL     Social History   Occupational History  . Not on file  Tobacco Use  . Smoking status: Current Every Day Smoker    Packs/day: 0.24    Types: Cigarettes  . Smokeless tobacco: Never Used  Substance and Sexual Activity  . Alcohol use: No  . Drug use: No  . Sexual activity: Yes    Birth control/protection: None

## 2017-09-12 ENCOUNTER — Telehealth: Payer: Self-pay | Admitting: Neurology

## 2017-09-12 NOTE — Telephone Encounter (Signed)
Pt calling to inform that acetaZOLAMIDE (DIAMOX) 250 MG tablet has not helped her and she is having terrible head aches.  Pt is asking for a call back to discuss

## 2017-09-12 NOTE — Telephone Encounter (Signed)
I called the patient.  The patient has just started to acetazolamide for her pseudotumor cerebri.  She is still taking just 1 tablet daily, we will have her start taking 1 twice a day of the 250 mg tablets.  The patient be seen in the next week or so in the office.

## 2017-09-18 ENCOUNTER — Encounter: Payer: Self-pay | Admitting: Neurology

## 2017-09-18 ENCOUNTER — Telehealth: Payer: Self-pay | Admitting: Neurology

## 2017-09-18 ENCOUNTER — Ambulatory Visit (INDEPENDENT_AMBULATORY_CARE_PROVIDER_SITE_OTHER): Payer: Medicaid Other | Admitting: Neurology

## 2017-09-18 DIAGNOSIS — R0683 Snoring: Secondary | ICD-10-CM

## 2017-09-18 NOTE — Telephone Encounter (Signed)
Pt presents for apt today. As I am getting her checked in the patient takes a personal call, I wait for her to finish her conversation before continuing to check her in. I then ask the patient what brings her here today. Pt states she is not sure if she is here for her headaches or sleep apnea. I informed the patient we had her coming in for a sleep consult to rule out sleep apnea. Pt states " I just had a test last week and they told me I was going to get a cpap machine." I inquired more information because if that is the case I wasn't understanding why the patient is here. The patient had a referral placed for a sleep consult on 07/30/17 with her visit with [name], NP. The patient was then set up on 08/02/17 with an apt today for a sleep consult. In the meantime the patient had went to her PCP who ordered a sleep study which appears to have been completed by Dr Maple HudsonYoung. Pt states her PCP is suppose to be setting up her getting a CPAP. I informed her that it appears that she has completed everything that we were going to do today and that this visit doesn't need to take place. Informed her that she would need to follow up with either Dr Maple HudsonYoung or her PCP in regards to her CPAP since the sleep study was not completed through us. Pt states so I came here for nothing. I asked the patient did she contact our office to inform us that she was completing the study through someone else. She didn't. I informed her that we didn't know that she chose to get treatment through someone else and I walked her out to the front desk to inform them that the pt would not be seen today. Pt verbalized understanding and was already calling her PCP to follow up on the CPAP machine as she was leaving.

## 2017-09-19 ENCOUNTER — Other Ambulatory Visit (INDEPENDENT_AMBULATORY_CARE_PROVIDER_SITE_OTHER): Payer: Self-pay | Admitting: Physician Assistant

## 2017-09-19 ENCOUNTER — Ambulatory Visit: Payer: Medicaid Other | Admitting: *Deleted

## 2017-09-19 DIAGNOSIS — F329 Major depressive disorder, single episode, unspecified: Secondary | ICD-10-CM

## 2017-09-19 DIAGNOSIS — F32A Depression, unspecified: Secondary | ICD-10-CM

## 2017-09-23 DIAGNOSIS — G4733 Obstructive sleep apnea (adult) (pediatric): Secondary | ICD-10-CM

## 2017-09-23 NOTE — Procedures (Signed)
Patient Name: [name], [name] Date: 09/05/2017 Gender: Female D.O.B: Nov 25, 1970 Age (years): 47 Referring Provider: Clent Demark PA Height (inches): 11 Interpreting Physician: Baird Lyons MD, ABSM Weight (lbs): 250 RPSGT: Carolin Coy BMI: 41 MRN: [id number] Neck Size: 15.50  CLINICAL INFORMATION Sleep Study Type: Split Night CPAP Indication for sleep study: Diabetes, Fatigue, Hypertension, Obesity, OSA, Snoring  Epworth Sleepiness Score: 22  SLEEP STUDY TECHNIQUE As per the AASM Manual for the Scoring of Sleep and Associated Events v2.3 (April 2016) with a hypopnea requiring 4% desaturations.  The channels recorded and monitored were frontal, central and occipital EEG, electrooculogram (EOG), submentalis EMG (chin), nasal and oral airflow, thoracic and abdominal wall motion, anterior tibialis EMG, snore microphone, electrocardiogram, and pulse oximetry. Continuous positive airway pressure (CPAP) was initiated when the patient met split night criteria and was titrated according to treat sleep-disordered breathing.  MEDICATIONS Medications self-administered by patient taken the night of the study : none reported  RESPIRATORY PARAMETERS Diagnostic  Total AHI (/hr): 22.5 RDI (/hr): 43.3 OA Index (/hr): 0.3 CA Index (/hr): 0.3 REM AHI (/hr): 18.9 NREM AHI (/hr): 22.7 Supine AHI (/hr): 19.0 Non-supine AHI (/hr): 23.59 Min O2 Sat (%): 87.0 Mean O2 (%): 96.5 Time below 88% (min): 0.1   Titration  Optimal Pressure (cm): 17 AHI at Optimal Pressure (/hr): 1.7 Min O2 at Optimal Pressure (%): 96.0 Supine % at Optimal (%): 100 Sleep % at Optimal (%): 99   SLEEP ARCHITECTURE The recording time for the entire night was 435.7 minutes.  During a baseline period of 190.1 minutes, the patient slept for 175.8 minutes in REM and nonREM, yielding a sleep efficiency of 92.5%%. Sleep onset after lights out was 2.8 minutes with a REM latency of 89.5 minutes. The patient spent  15.1%% of the night in stage N1 sleep, 79.5%% in stage N2 sleep, 0.0%% in stage N3 and 5.4%% in REM.  During the titration period of 241.1 minutes, the patient slept for 234.5 minutes in REM and nonREM, yielding a sleep efficiency of 97.2%%. Sleep onset after CPAP initiation was 4.3 minutes with a REM latency of 41.0 minutes. The patient spent 5.3%% of the night in stage N1 sleep, 62.5%% in stage N2 sleep, 0.0%% in stage N3 and 32.2%% in REM.  CARDIAC DATA The 2 lead EKG demonstrated sinus rhythm. The mean heart rate was 100.0 beats per minute. Other EKG findings include: None.  LEG MOVEMENT DATA The total Periodic Limb Movements of Sleep (PLMS) were 0. The PLMS index was 0.0 .  IMPRESSIONS - Moderate obstructive sleep apnea occurred during the diagnostic portion of the study(AHI = 22.5/hour). An optimal PAP pressure was selected for this patient ( 17 cm of water) - No significant central sleep apnea occurred during the diagnostic portion of the study (CAI = 0.3/hour). - The patient had minimal or no oxygen desaturation during the diagnostic portion of the study (Min O2 = 87.0%) - The patient snored with moderate snoring volume during the diagnostic portion of the study. - No cardiac abnormalities were noted during this study. - Clinically significant periodic limb movements did not occur during sleep. DIAGNOSIS - Obstructive Sleep Apnea (327.23 [G47.33 ICD-10])  RECOMMENDATIONS - Trial of CPAP therapy on 17 cm H2O or autopap 10-20. Patient wore a Medium size Resmed Full Face Mask AirFit F20 mask and heated humidification. - Be careful with alcohol, sedatives and other CNS depressants that may worsen sleep apnea and disrupt normal sleep architecture. - Sleep hygiene should be reviewed to  assess factors that may improve sleep quality. - Weight management and regular exercise should be initiated or continued.  [Electronically signed] 09/23/2017 06:35 PM  Baird Lyons MD, [place],  American Board of Sleep Medicine   NPI: [id number]                          [place], [place] [facility]  ELECTRONICALLY SIGNED ON:  09/23/2017, 6:33 PM [place] PH: [phone]   FX: [phone] [place]

## 2017-09-24 ENCOUNTER — Telehealth (INDEPENDENT_AMBULATORY_CARE_PROVIDER_SITE_OTHER): Payer: Self-pay

## 2017-09-24 NOTE — Telephone Encounter (Signed)
Patient is aware of OSA diagnosis. Per the split night study providers recommendation are already noted patient does not need titration. Called sleep center to verify and terri verified that patient does not need titration. Proceed to DME company for CPAP equipment. Maryjean Mornempestt S Starlene Consuegra, CMA

## 2017-09-24 NOTE — Telephone Encounter (Signed)
-----   Message from Loletta Specteroger David Gomez, PA-C sent at 09/24/2017  8:44 AM EDT ----- Diagnosis is OSA. Unsure whether or not patient needs an order of CPAP titration. Please call sleep center and find out if they need an order from us. Thank you.

## 2017-09-25 NOTE — Telephone Encounter (Signed)
Patient has medicaid, she will need to go through [facility]. Documents have already been faxed to them for CPAP equipment. [name], CMA

## 2017-09-25 NOTE — Telephone Encounter (Signed)
Noted. Thanks.

## 2017-09-25 NOTE — Telephone Encounter (Signed)
Sounds like she needs the [employer] form for a free CPAP. Please have them fax to us.

## 2017-09-26 ENCOUNTER — Encounter (HOSPITAL_COMMUNITY): Payer: Self-pay | Admitting: Emergency Medicine

## 2017-09-26 ENCOUNTER — Ambulatory Visit (HOSPITAL_COMMUNITY)
Admission: EM | Admit: 2017-09-26 | Discharge: 2017-09-26 | Disposition: A | Discharge status: Home / Self Care | Payer: Medicaid Other | Attending: Internal Medicine | Admitting: Internal Medicine

## 2017-09-26 ENCOUNTER — Ambulatory Visit (INDEPENDENT_AMBULATORY_CARE_PROVIDER_SITE_OTHER): Payer: Medicaid Other

## 2017-09-26 DIAGNOSIS — J181 Lobar pneumonia, unspecified organism: Secondary | ICD-10-CM | POA: Diagnosis not present

## 2017-09-26 DIAGNOSIS — J189 Pneumonia, unspecified organism: Secondary | ICD-10-CM

## 2017-09-26 MED ORDER — HYDROCOD POLST-CPM POLST ER 10-8 MG/5ML PO SUER: 5.0000 mL | Freq: Two times a day (BID) | ORAL | 0 refills | Status: AC | PRN

## 2017-09-26 MED ORDER — AZITHROMYCIN 250 MG PO TABS: 250.0000 mg | ORAL_TABLET | Freq: Every day | ORAL | 0 refills | Status: DC

## 2017-09-26 NOTE — ED Provider Notes (Signed)
[facility]    CSN: [id number] Arrival date & time: 09/26/17  1144     History   Chief Complaint Chief Complaint  Patient presents with  . URI    HPI [name] is a 47 y.o. female.   This is a 47 year old female with history of hypertension, diabetes, depression, anxiety and migraine comes today for cold symptoms.  Symptoms started 5 days ago with productive coughing, congestion subjective fever, chills, low appetite, some shortness of breath.  Denies chest pain, wheezing, headache, abdominal pain.  Patient also denies sinus pressure or pain.  Patient have tried multiple over-the-counter medications without significant improvement.  Patient googled her symptoms and believes that she has pneumonia.  Denies history of pneumonia in the past.     Past Medical History:  Diagnosis Date  . Anxiety   . Common migraine with intractable migraine 10/02/2016  . Depression   . Diabetes mellitus   . Hypertension     Patient Active Problem List   Diagnosis Date Noted  . Somnolence, daytime 07/30/2017  . Snoring 07/30/2017  . Morbid obesity (HCC) 07/30/2017  . Migraine 10/02/2016  . Common migraine with intractable migraine 10/02/2016  . Type 2 diabetes mellitus without complication, without long-term current use of insulin (HCC) 08/17/2016    Past Surgical History:  Procedure Laterality Date  . BTL      OB History    Gravida  5   Para  3   Term  3   Preterm      AB  2   Living  3     SAB  1   TAB  1   Ectopic      Multiple      Live Births               Home Medications    Prior to Admission medications   Medication Sig Start Date End Date Taking? Authorizing Provider  acetaZOLAMIDE (DIAMOX) 250 MG tablet 1 tablet daily for 2 weeks and then take 1 tablet twice daily. 09/03/17   York Spaniel, MD  aspirin EC 81 MG tablet Take 81 mg by mouth every 6 (six) hours as needed for mild pain.     [provider]  azithromycin  (ZITHROMAX) 250 MG tablet Take 1 tablet (250 mg total) by mouth daily. Take first 2 tablets together, then 1 every day until finished. 09/26/17   Lucia Estelle, NP  beclomethasone (QVAR) 40 MCG/ACT inhaler Inhale 2 puffs into the lungs 2 (two) times daily.    [provider]  Butalbital-APAP-Caffeine (FIORICET) 50-300-40 MG CAPS Take 1 capsule by mouth 3 (three) times daily as needed. 08/01/17   Loletta Specter, PA-C  cetirizine (ZYRTEC) 10 MG tablet Take 1 tablet (10 mg total) by mouth daily. 11/07/16   Loletta Specter, PA-C  clonazePAM (KLONOPIN) 1 MG tablet Take 1 tablet (1 mg total) by mouth 2 (two) times daily as needed for anxiety. 08/01/17   Loletta Specter, PA-C  docusate sodium (COLACE) 100 MG capsule Take 100 mg by mouth daily as needed.     [provider]  escitalopram (LEXAPRO) 20 MG tablet Take 1 tablet (20 mg total) by mouth daily. 08/01/17   Loletta Specter, PA-C  gabapentin (NEURONTIN) 300 MG capsule Take 1 capsule (300 mg total) by mouth 3 (three) times daily. 08/01/17   Loletta Specter, PA-C  glimepiride (AMARYL) 4 MG tablet Take 1 tablet (4 mg total) by mouth daily  before breakfast. 06/18/17   Loletta SpecterGomez, Roger David, PA-C  glucose blood (ACCU-CHEK AVIVA PLUS) test strip Use as instructed 08/17/16   Loletta SpecterGomez, Roger David, PA-C  hydrochlorothiazide (HYDRODIURIL) 25 MG tablet Take 1 tablet (25 mg total) by mouth daily. 01/15/17   Loletta SpecterGomez, Roger David, PA-C  insulin aspart (NOVOLOG) 100 UNIT/ML injection Inject 10 Units into the skin 3 (three) times daily before meals. 01/11/17   Loletta SpecterGomez, Roger David, PA-C  Insulin Detemir (LEVEMIR) 100 UNIT/ML Pen Inject 50 Units into the skin 2 (two) times daily. 08/27/17 09/26/17  Claiborne RiggFleming, Zelda W, NP  Insulin Pen Needle (B-D UF III MINI PEN NEEDLES) 31G X 5 MM MISC Use as instructed. Twice daily. 08/27/17   Claiborne RiggFleming, Zelda W, NP  lactulose (CHRONULAC) 10 GM/15ML solution Take 30 mLs (20 g total) by mouth 2 (two) times daily as needed for mild  constipation. 10/06/16   Loletta SpecterGomez, Roger David, PA-C  Lancets [facility](ACCU-CHEK SOFT TOUCH) lancets Use as instructed 01/11/17   Loletta SpecterGomez, Roger David, PA-C  megestrol (MEGACE) 40 MG tablet Take 1 tablet (40 mg total) by mouth 2 (two) times daily. 07/30/17   Allie Bossierove, Myra C, MD  metFORMIN (GLUCOPHAGE) 1000 MG tablet Take 1 tablet (1,000 mg total) by mouth 2 (two) times daily with a meal. 01/11/17   Loletta SpecterGomez, Roger David, PA-C  metoCLOPramide (REGLAN) 5 MG tablet Take 1 tablet (5 mg total) by mouth 3 (three) times daily before meals. 11/07/16   Loletta SpecterGomez, Roger David, PA-C  nortriptyline (PAMELOR) 10 MG capsule Take one capsule at night for one week, then take 2 capsules at night for one week, then take 3 capsules at night 08/14/17   York SpanielWillis, Charles K, MD  PROVENTIL HFA 108 [phone](90 Base) MCG/ACT inhaler Inhale 1-2 puffs into the lungs every 6 (six) hours as needed for wheezing or shortness of breath. 04/06/17   Loletta SpecterGomez, Roger David, PA-C    Family History Family History  Problem Relation Age of Onset  . Diabetes Mother   . Hypertension Mother   . Renal Disease Mother   . Cancer Father     Social History Social History   Tobacco Use  . Smoking status: Current Every Day Smoker    Packs/day: 0.24    Types: Cigarettes  . Smokeless tobacco: Never Used  Substance Use Topics  . Alcohol use: No  . Drug use: No     Allergies   Lisinopril   Review of Systems Review of Systems  Constitutional:       As stated in the HPI     Physical Exam Triage Vital Signs ED Triage Vitals [09/26/17 1211]  Enc Vitals Group     BP (!) 165/98     Pulse Rate 96     Resp 18     Temp 99.2 F (37.3 C)     Temp Source Oral     SpO2 96 %     Weight      Height      Head Circumference      Peak Flow      Pain Score      Pain Loc      Pain Edu?      Excl. in GC?    No data found.  Updated Vital Signs BP (!) 165/98 (BP Location: Left Arm)   Pulse 96   Temp 99.2 F (37.3 C) (Oral)   Resp 18   LMP 09/09/2017 (Approximate)    SpO2 96%   Physical Exam  Constitutional: She is  oriented to person, place, and time. She appears well-developed and well-nourished.  HENT:  Head: Normocephalic and atraumatic.  Right Ear: External ear normal.  Left Ear: External ear normal.  Nose: Nose normal.  Mouth/Throat: Oropharynx is clear and moist. No oropharyngeal exudate.  TM pearly gray without erythema.  No sinus tenderness to percuss.  Eyes: Pupils are equal, round, and reactive to light. EOM are normal.  Neck: Normal range of motion. Neck supple.  Cardiovascular: Normal rate, regular rhythm and normal heart sounds.  No murmur heard. Pulmonary/Chest: Effort normal and breath sounds normal. No respiratory distress. She has no wheezes.  Abdominal: Soft. Bowel sounds are normal. There is no tenderness.  Lymphadenopathy:    She has no cervical adenopathy.  Neurological: She is alert and oriented to person, place, and time.  Skin: Skin is warm and dry.  Psychiatric: She has a normal mood and affect.  Nursing note and vitals reviewed.   UC Treatments / Results  Labs (all labs ordered are listed, but only abnormal results are displayed) Labs Reviewed - No data to display  EKG None  Radiology Dg Chest 2 View  Result Date: 09/26/2017 CLINICAL DATA:  Dry cough the past week with fever.  History COPD. EXAM: CHEST - 2 VIEW COMPARISON:  Chest x-ray dated April 24, 2016. FINDINGS: The heart size and mediastinal contours are within normal limits. Normal pulmonary vascularity. Faint hazy opacity at the left lung base. No focal consolidation, pleural effusion, or pneumothorax. No acute osseous abnormality. IMPRESSION: 1. Faint hazy opacity at the left lung base may be infectious or inflammatory. No consolidation. Electronically Signed   By: Obie Dredge M.D.   On: 09/26/2017 12:55    Procedures Procedures (including critical care time)  Medications Ordered in UC Medications - No data to display  Initial Impression /  Assessment and Plan / UC Course  I have reviewed the triage vital signs and the nursing notes.  Pertinent labs & imaging results that were available during my care of the patient were reviewed by me and considered in my medical decision making (see chart for details).  Final Clinical Impressions(s) / UC Diagnoses   Final diagnoses:  Community acquired pneumonia of left lower lobe of lung (HCC)   Checks x-ray shows faint hazy opacity at the left lung base this may be infectious or inflammatory.  Will treat empirically for CAP with Z-Pak.  Advised patient to follow-up with PCP in 1 week for reevaluation. Patient states understanding and will call with questions or problems. Discharge instruction given.  Discharge Instructions   None    ED Prescriptions    Medication Sig Dispense Auth. Provider   azithromycin (ZITHROMAX) 250 MG tablet Take 1 tablet (250 mg total) by mouth daily. Take first 2 tablets together, then 1 every day until finished. 6 tablet Lucia Estelle, NP     Controlled Substance Prescriptions [place] Controlled Substance Registry consulted? Not Applicable   Lucia Estelle, NP 09/26/17 1311

## 2017-09-26 NOTE — ED Triage Notes (Signed)
Pt talking on cell phone during triage.

## 2017-10-03 ENCOUNTER — Other Ambulatory Visit: Payer: Self-pay | Admitting: Family Medicine

## 2017-10-03 ENCOUNTER — Telehealth: Payer: Self-pay | Admitting: Family Medicine

## 2017-10-03 NOTE — Telephone Encounter (Addendum)
[name] also called and left a voice message on nurse voicemail asking for medicine for the cysts on her ovary. I called [name] and left a  Message I got her call and was returning her call and that if she is having severe pain needs to [facility] ; otherwise must be seen in our office to receive meds for pain. May call us back to discuss . I do not see that we have seen her in our office since February but [facility].

## 2017-10-03 NOTE — Telephone Encounter (Signed)
[facility]   Patient need some pain meds said she's in a lot of pain

## 2017-10-08 ENCOUNTER — Ambulatory Visit (INDEPENDENT_AMBULATORY_CARE_PROVIDER_SITE_OTHER): Payer: Medicaid Other | Admitting: Physician Assistant

## 2017-10-09 ENCOUNTER — Telehealth (INDEPENDENT_AMBULATORY_CARE_PROVIDER_SITE_OTHER): Payer: Self-pay | Admitting: Physician Assistant

## 2017-10-09 NOTE — Telephone Encounter (Signed)
I see her note says to do Lantus 50 units twice a day. Thank you.

## 2017-10-09 NOTE — Telephone Encounter (Signed)
Patient  called stating that with a little confusion about medication. Patient states that she is supposed to take 60 units of insulin but her pharmacists says to take 50 units. She would like to receive a call from PCP to clarify the amount of insulin she should take.  Insulin Detemir (LEVEMIR) 100 UNIT/ML Pen   Please Advise [phone]  Thank you

## 2017-10-15 NOTE — Telephone Encounter (Signed)
Patient states acetaZOLAMIDE (DIAMOX) 250 MG tablet is not helping with migraines. Can something else be called to [facility]?  I advised Dr. Anne HahnWillis is out of the office and will send message to his nurse. Please call and discuss.

## 2017-10-16 MED ORDER — NORTRIPTYLINE HCL 10 MG PO CAPS: ORAL_CAPSULE | ORAL | 3 refills | Status: DC

## 2017-10-16 NOTE — Addendum Note (Signed)
Addended by: Hillis RangeKING, Eura Mccauslin L on: 10/16/2017 09:14 AM   Modules accepted: Orders

## 2017-10-16 NOTE — Telephone Encounter (Signed)
Called pt. She is taking acetazolamide 2tabs BID. She stated Dr. Anne HahnWillis advised her to take it like this and stop nortriptyline. She stopped nortriptyline around 09/12/17. She is agreeable to try restarting nortriptyline to take with the acetzolamide.  Received the following VO from Dr. Terrace ArabiaYan: "nortriptyline 10mg  qhs for 1 week, then 20mg  qhs for 1 week, then 30mg  qhs after then". I advised her that once reaching maintenance dose she should give it 2-3 weeks to see if its effective.  She will call back if she has further questions/concerns.  [facility].

## 2017-10-16 NOTE — Telephone Encounter (Signed)
Spoke with Dr. Terrace ArabiaYan this am. Dr. Anne HahnWillis out. Per Dr. Terrace ArabiaYan- she should continue the Diamox. We can increase her nortriptyline dose to 50mg  qhs if she is tolerating the 30mg  qhs okay. Can switch her to maxalt ODT 10mg , qty 10, 0 refills if imitrex not working well.

## 2017-10-19 LAB — HM DIABETES EYE EXAM

## 2017-10-22 ENCOUNTER — Ambulatory Visit: Payer: Medicaid Other | Admitting: Family Medicine

## 2017-10-26 ENCOUNTER — Other Ambulatory Visit (INDEPENDENT_AMBULATORY_CARE_PROVIDER_SITE_OTHER): Payer: Self-pay | Admitting: Physician Assistant

## 2017-10-26 DIAGNOSIS — G4733 Obstructive sleep apnea (adult) (pediatric): Secondary | ICD-10-CM

## 2017-10-29 ENCOUNTER — Other Ambulatory Visit (INDEPENDENT_AMBULATORY_CARE_PROVIDER_SITE_OTHER): Payer: Self-pay | Admitting: Physician Assistant

## 2017-10-29 DIAGNOSIS — G4733 Obstructive sleep apnea (adult) (pediatric): Secondary | ICD-10-CM

## 2017-10-30 ENCOUNTER — Other Ambulatory Visit (INDEPENDENT_AMBULATORY_CARE_PROVIDER_SITE_OTHER): Payer: Self-pay | Admitting: Physician Assistant

## 2017-10-30 ENCOUNTER — Other Ambulatory Visit: Payer: Self-pay | Admitting: Obstetrics & Gynecology

## 2017-10-30 DIAGNOSIS — G43909 Migraine, unspecified, not intractable, without status migrainosus: Secondary | ICD-10-CM

## 2017-10-30 DIAGNOSIS — I10 Essential (primary) hypertension: Secondary | ICD-10-CM

## 2017-10-30 DIAGNOSIS — F329 Major depressive disorder, single episode, unspecified: Secondary | ICD-10-CM

## 2017-10-30 DIAGNOSIS — E785 Hyperlipidemia, unspecified: Secondary | ICD-10-CM

## 2017-10-30 DIAGNOSIS — N939 Abnormal uterine and vaginal bleeding, unspecified: Secondary | ICD-10-CM

## 2017-10-30 DIAGNOSIS — F32A Depression, unspecified: Secondary | ICD-10-CM

## 2017-10-30 NOTE — Telephone Encounter (Signed)
FWD to PCP. Tempestt S Roberts, CMA  

## 2017-11-01 ENCOUNTER — Telehealth: Payer: Self-pay | Admitting: Neurology

## 2017-11-01 NOTE — Telephone Encounter (Signed)
Called and spoke with pt. Scheduled appt for 11/06/17 at 9am, check in no later than 845am.

## 2017-11-01 NOTE — Telephone Encounter (Signed)
I called pt to get some more information. She is taking diamox 250mg  2 tabs BID and nortriptyline 10mg  2 tabs in the am and 3 caps qhs. I went over instructions of how she should be taking medication: "3 capsules at night". She verbalized understanding but is taking 2 caps in the am because her head is "hurting so bad and we don't understand". I advised next time she should contact our office before increasing her dose to ensure it is safe prior to doing this. She stopped taking ibuprofen and other OTC meds for her headaches.   I inquired about her CPAP machine via Dr. Maple HudsonYoung. She states she has not started on new machine yet. She is supposed to go to a class next Thursday for this. She has been using an old machine she has had from years ago. I educated patient that this may be what Is making her headaches worse. She states her oxygen levels were low per Dr. Maple HudsonYoung.

## 2017-11-01 NOTE — Telephone Encounter (Signed)
I called the patient.  The patient is on acetazolamide and nortriptyline, the headaches remain quite severe.  Headaches are daily in nature.  The patient does report photophobia and phonophobia with the headache.  The patient had an opening pressure 290 suggesting pseudotumor cerebri.  The patient may have some migrainous component as well, could potentially benefit from Botox therapy.  I will get a revisit for her. We may need to repeat another lumbar puncture.

## 2017-11-01 NOTE — Telephone Encounter (Signed)
Called patient to reschedule her appointment with [name] due to [name] being out the day of her appt. While talking to the patient, she expressed that her headaches have not improved and she asked for the nurse to call her about scheduling an appointment as soon as possible with Dr. Anne HahnWillis.

## 2017-11-05 ENCOUNTER — Ambulatory Visit (INDEPENDENT_AMBULATORY_CARE_PROVIDER_SITE_OTHER): Payer: Medicaid Other | Admitting: Physician Assistant

## 2017-11-05 ENCOUNTER — Encounter

## 2017-11-06 ENCOUNTER — Encounter: Payer: Self-pay | Admitting: Neurology

## 2017-11-06 ENCOUNTER — Ambulatory Visit: Payer: Medicaid Other | Admitting: Neurology

## 2017-11-06 ENCOUNTER — Other Ambulatory Visit: Payer: Self-pay | Admitting: Nurse Practitioner

## 2017-11-06 ENCOUNTER — Telehealth: Payer: Self-pay | Admitting: Neurology

## 2017-11-06 VITALS — BP 118/77 | HR 75 | Ht 65.5 in | Wt 253.0 lb

## 2017-11-06 DIAGNOSIS — G932 Benign intracranial hypertension: Secondary | ICD-10-CM | POA: Insufficient documentation

## 2017-11-06 DIAGNOSIS — Z5181 Encounter for therapeutic drug level monitoring: Secondary | ICD-10-CM

## 2017-11-06 DIAGNOSIS — G43019 Migraine without aura, intractable, without status migrainosus: Secondary | ICD-10-CM

## 2017-11-06 MED ORDER — NORTRIPTYLINE HCL 25 MG PO CAPS: 50.0000 mg | ORAL_CAPSULE | Freq: Every day | ORAL | 3 refills | Status: DC

## 2017-11-06 NOTE — Telephone Encounter (Signed)
Patient was brought to me today by Emma RN fKara Meador new enrollment for Botox therapy. An apt was made with the patient and we discussed the use of the Specialty Pharmacy. I gave her a new patient folder. Paperwork has been filled out for [place] [employer] authorization and [facility] enrollment and placed on Emma's desk for signature by Dr. Anne HahnWillis.

## 2017-11-06 NOTE — Telephone Encounter (Signed)
Gave completed/signed forms back to Living[place].

## 2017-11-06 NOTE — Patient Instructions (Signed)
We will go up on the nortriptyline to 50 mg at night.   We will set up Botox therapy.  We will get a spinal tap to look at the spinal pressure, if it remains high, we will increase the diamox.

## 2017-11-06 NOTE — Progress Notes (Signed)
Reason for visit: Headache  [name] is an 47 y.o. female  History of present illness:  [name] is a 47 year old right-handed black female with a history of migraine headaches, diabetes, and hypertension.  She has undergone a lumbar puncture in May 2019 that documented an opening pressure of 29.  The patient has been placed on Diamox but her headaches have not improved whatsoever.  The patient is on nortriptyline 30 mg at night, she tolerates the medication well.  She is on 250 mg twice daily of Diamox.  The patient is unable to function, light and sound are bothersome to her, she remains queasy and nauseated all the time.  The patient returns to this office on an urgent basis for an evaluation.  Past Medical History:  Diagnosis Date  . Anxiety   . Common migraine with intractable migraine 10/02/2016  . Depression   . Diabetes mellitus   . Hypertension     Past Surgical History:  Procedure Laterality Date  . BTL      Family History  Problem Relation Age of Onset  . Diabetes Mother   . Hypertension Mother   . Renal Disease Mother   . Cancer Father     Social history:  reports that she has been smoking cigarettes.  She has been smoking about 0.24 packs per day. She has never used smokeless tobacco. She reports that she does not drink alcohol or use drugs.    Allergies  Allergen Reactions  . Lisinopril     angioedema    Medications:  Prior to Admission medications   Medication Sig Start Date End Date Taking? Authorizing Provider  acetaZOLAMIDE (DIAMOX) 250 MG tablet 1 tablet daily for 2 weeks and then take 1 tablet twice daily. Patient taking differently: 500 mg 2 (two) times daily. 1 tablet daily for 2 weeks and then take 1 tablet twice daily. 09/03/17  Yes York Spaniel, MD  aspirin EC 81 MG tablet Take 81 mg by mouth every 6 (six) hours as needed for mild pain.    Yes [provider]  beclomethasone (QVAR) 40 MCG/ACT inhaler Inhale 2 puffs into  the lungs 2 (two) times daily.   Yes [provider]  Butalbital-APAP-Caffeine (FIORICET) 50-300-40 MG CAPS Take 1 capsule by mouth 3 (three) times daily as needed. 08/01/17  Yes Loletta Specter, PA-C  cetirizine (ZYRTEC) 10 MG tablet Take 1 tablet (10 mg total) by mouth daily. 11/07/16  Yes Loletta Specter, PA-C  clonazePAM (KLONOPIN) 1 MG tablet Take 1 tablet (1 mg total) by mouth 2 (two) times daily as needed for anxiety. 08/01/17  Yes Loletta Specter, PA-C  docusate sodium (COLACE) 100 MG capsule Take 100 mg by mouth daily as needed.    Yes [provider]  escitalopram (LEXAPRO) 20 MG tablet Take 1 tablet (20 mg total) by mouth daily. 08/01/17  Yes Loletta Specter, PA-C  gabapentin (NEURONTIN) 300 MG capsule Take 1 capsule (300 mg total) by mouth 3 (three) times daily. 08/01/17  Yes Loletta Specter, PA-C  glimepiride (AMARYL) 4 MG tablet Take 1 tablet (4 mg total) by mouth daily before breakfast. 06/18/17  Yes Loletta Specter, PA-C  glucose blood (ACCU-CHEK AVIVA PLUS) test strip Use as instructed 08/17/16  Yes Loletta Specter, PA-C  hydrochlorothiazide (HYDRODIURIL) 25 MG tablet TAKE 1 TABLET (25 MG TOTAL) BY MOUTH DAILY. 10/30/17  Yes Loletta Specter, PA-C  insulin aspart (NOVOLOG) 100 UNIT/ML injection Inject 10 Units  into the skin 3 (three) times daily before meals. 01/11/17  Yes Loletta Specter, PA-C  Insulin Detemir (LEVEMIR) 100 UNIT/ML Pen Inject 50 Units into the skin 2 (two) times daily. 08/27/17 11/06/17 Yes Claiborne Rigg, NP  Insulin Pen Needle (B-D UF III MINI PEN NEEDLES) 31G X 5 MM MISC Use as instructed. Twice daily. 08/27/17  Yes Claiborne Rigg, NP  lactulose (CHRONULAC) 10 GM/15ML solution Take 30 mLs (20 g total) by mouth 2 (two) times daily as needed for mild constipation. 10/06/16  Yes Loletta Specter, PA-C  Lancets [facility]) lancets Use as instructed 01/11/17  Yes Loletta Specter, PA-C  megestrol (MEGACE) 40 MG tablet  TAKE 1 TABLET (40 MG TOTAL) BY MOUTH 2 (TWO) TIMES DAILY. 10/31/17  Yes Allie Bossier, MD  metFORMIN (GLUCOPHAGE) 1000 MG tablet Take 1 tablet (1,000 mg total) by mouth 2 (two) times daily with a meal. 01/11/17  Yes Loletta Specter, PA-C  metoCLOPramide (REGLAN) 5 MG tablet Take 1 tablet (5 mg total) by mouth 3 (three) times daily before meals. 11/07/16  Yes Loletta Specter, PA-C  nortriptyline (PAMELOR) 10 MG capsule Take one capsule at night for one week, then take 2 capsules at night for one week, then take 3 capsules at night 10/16/17  Yes Levert Feinstein, MD  PROVENTIL HFA 108 [phone] Base) MCG/ACT inhaler Inhale 1-2 puffs into the lungs every 6 (six) hours as needed for wheezing or shortness of breath. 04/06/17  Yes Loletta Specter, PA-C  sulindac (CLINORIL) 150 MG tablet TAKE 1 TABLET (150 MG TOTAL) BY MOUTH 2 (TWO) TIMES DAILY. 10/04/17  Yes Levie Heritage, DO    ROS:  Out of a complete 14 system review of symptoms, the patient complains only of the following symptoms, and all other reviewed systems are negative.  Excessive sweating Light sensitivity, blurred vision Shortness of breath Sleep apnea, snoring Joint pain, aching muscles Dizziness, headache  Blood pressure 118/77, pulse 75, height 5' 5.5" (1.664 m), weight 253 lb (114.8 kg).  Physical Exam  General: The patient is alert and cooperative at the time of the examination.  The patient is markedly obese.  Skin: No significant peripheral edema is noted.   Neurologic Exam  Mental status: The patient is alert and oriented x 3 at the time of the examination. The patient has apparent normal recent and remote memory, with an apparently normal attention span and concentration ability.   Cranial nerves: Facial symmetry is present. Speech is normal, no aphasia or dysarthria is noted. Extraocular movements are full. Visual fields are full.  Motor: The patient has good strength in all 4 extremities.  Sensory examination: Soft touch  sensation is symmetric on the face, arms, and legs.  Coordination: The patient has good finger-nose-finger and heel-to-shin bilaterally.  Gait and station: The patient has a normal gait. Tandem gait is normal. Romberg is negative. No drift is seen.  Reflexes: Deep tendon reflexes are symmetric.   Assessment/Plan:  1.  Pseudotumor cerebri  2.  Intractable migraine headache  The patient is not responding at all to medical therapy.  The patient has some migrainous features.  She is on Diamox for pseudotumor cerebri as well.  The patient will be sent for blood work today looking at the chemistry panel, she will be set up for Botox therapy and undergo a repeat lumbar puncture to determine what the opening pressure is.  If it remains high, the Diamox will be increased.  The  nortriptyline will be increased to 50 mg at night.  The patient will follow-up in 4 months.  The patient has been on gabapentin, Lexapro, Excedrin Migraine, BC powders, Fioricet, Imitrex, and Topamax in the past.  Marlan Palau. Keith Willis MD 11/06/2017 9:03 AM  [facility] [address] [place], [place] [id number]  Phone [phone] Fax [phone]

## 2017-11-06 NOTE — Telephone Encounter (Signed)
Faxed completed forms to [place] [facility] and [facility].

## 2017-11-07 ENCOUNTER — Telehealth: Payer: Self-pay | Admitting: *Deleted

## 2017-11-07 LAB — COMPREHENSIVE METABOLIC PANEL
A/G RATIO: 1.4 (ref 1.2–2.2)
ALBUMIN: 3.9 g/dL (ref 3.5–5.5)
ALT: 39 IU/L — AB (ref 0–32)
AST: 22 IU/L (ref 0–40)
Alkaline Phosphatase: 130 IU/L — ABNORMAL HIGH (ref 39–117)
BILIRUBIN TOTAL: 0.2 mg/dL (ref 0.0–1.2)
BUN / CREAT RATIO: 17 (ref 9–23)
BUN: 11 mg/dL (ref 6–24)
CHLORIDE: 107 mmol/L — AB (ref 96–106)
CO2: 19 mmol/L — ABNORMAL LOW (ref 20–29)
Calcium: 9.3 mg/dL (ref 8.7–10.2)
Creatinine, Ser: 0.65 mg/dL (ref 0.57–1.00)
GFR calc Af Amer: 121 mL/min/{1.73_m2} (ref >59)
GFR calc non Af Amer: 105 mL/min/{1.73_m2} (ref >59)
Globulin, Total: 2.7 g/dL (ref 1.5–4.5)
Glucose: 266 mg/dL — ABNORMAL HIGH (ref 65–99)
POTASSIUM: 4.3 mmol/L (ref 3.5–5.2)
Sodium: 141 mmol/L (ref 134–144)
Total Protein: 6.6 g/dL (ref 6.0–8.5)

## 2017-11-07 NOTE — Telephone Encounter (Signed)
Called and spoke w/ pt about lab results per Dr. Anne HahnWillis note. She verbalized understanding.   Advised if she does not hear about scheduling LP to call and let us know.

## 2017-11-07 NOTE — Telephone Encounter (Signed)
-----   Message from York Spanielharles K Willis, MD sent at 11/07/2017  8:23 AM EDT ----- Chemistry profile shows an elevated glucose level, chloride level is minimally elevated and CO2 slightly low secondary to the use of Diamox.  Stable elevations of alkaline phosphatase and ALT enzyme, no change in dosing of the Diamox for now. Please call the patient. ----- Message ----- From: Nell RangeInterface, [facility] Lab Results In Sent: 11/07/2017   5:39 AM To: York Spanielharles K Willis, MD

## 2017-11-14 ENCOUNTER — Telehealth: Payer: Self-pay | Admitting: Neurology

## 2017-11-14 MED ORDER — PROPRANOLOL HCL 20 MG PO TABS: ORAL_TABLET | ORAL | 3 refills | Status: DC

## 2017-11-14 NOTE — Telephone Encounter (Signed)
I called the patient.  We are trying to get the patient on Botox but her insurance company will not cover Botox unless we try other medications such as a beta-blocker.  This needs to be used for at least 3 months.  She has already been on a tricyclic antidepressant and anticonvulsant medication.  I will call in a prescription for propranolol.

## 2017-11-21 NOTE — Progress Notes (Signed)
Patient had outside sleep study and will need to continue sleep care at the place of testing.

## 2017-11-22 ENCOUNTER — Ambulatory Visit: Payer: Medicaid Other | Admitting: Nurse Practitioner

## 2017-11-22 NOTE — Patient Instructions (Signed)
Patient left without being seen- sleep study not performed at our facility,

## 2017-11-23 ENCOUNTER — Ambulatory Visit
Admission: RE | Admit: 2017-11-23 | Discharge: 2017-11-23 | Disposition: A | Discharge status: Home / Self Care | Payer: Medicaid Other | Source: Ambulatory Visit | Attending: Neurology | Admitting: Neurology

## 2017-11-23 DIAGNOSIS — G932 Benign intracranial hypertension: Secondary | ICD-10-CM

## 2017-11-23 LAB — CSF CELL COUNT WITH DIFFERENTIAL
RBC COUNT CSF: 0 {cells}/uL (ref 0–10)
WBC CSF: 3 {cells}/uL (ref 0–5)

## 2017-11-23 LAB — GLUCOSE, CSF: Glucose, CSF: 94 mg/dL — ABNORMAL HIGH (ref 40–80)

## 2017-11-23 LAB — PROTEIN, CSF: TOTAL PROTEIN, CSF: 40 mg/dL (ref 15–45)

## 2017-11-23 NOTE — Discharge Instructions (Signed)

## 2017-11-25 ENCOUNTER — Telehealth: Payer: Self-pay | Admitting: Neurology

## 2017-11-25 DIAGNOSIS — Z79899 Other long term (current) drug therapy: Secondary | ICD-10-CM

## 2017-11-25 DIAGNOSIS — G4489 Other headache syndrome: Secondary | ICD-10-CM

## 2017-11-25 MED ORDER — ACETAZOLAMIDE ER 500 MG PO CP12: ORAL_CAPSULE | ORAL | 1 refills | Status: DC

## 2017-11-25 NOTE — Telephone Encounter (Signed)
I called the patient.  The opening pressure to the spinal tap was 270, was 290 previously.  The patient is already taking Diamox 500 mg twice daily.  The prescription will be increased to 500 mg in the morning and thousand milligrams in the evening.  If the headaches do not abate, we will get MRI of the brain with MRV evaluation.  A prescription was sent in.

## 2017-11-26 ENCOUNTER — Ambulatory Visit (INDEPENDENT_AMBULATORY_CARE_PROVIDER_SITE_OTHER): Payer: Medicaid Other | Admitting: Orthopedic Surgery

## 2017-11-26 ENCOUNTER — Encounter (INDEPENDENT_AMBULATORY_CARE_PROVIDER_SITE_OTHER): Payer: Self-pay | Admitting: Orthopedic Surgery

## 2017-11-26 DIAGNOSIS — M1711 Unilateral primary osteoarthritis, right knee: Secondary | ICD-10-CM | POA: Diagnosis not present

## 2017-11-26 MED ORDER — TRAMADOL HCL 50 MG PO TABS: ORAL_TABLET | ORAL | 0 refills | Status: DC

## 2017-11-27 ENCOUNTER — Encounter (INDEPENDENT_AMBULATORY_CARE_PROVIDER_SITE_OTHER): Payer: Self-pay | Admitting: Orthopedic Surgery

## 2017-11-27 NOTE — Progress Notes (Signed)
Office Visit Note   Patient: [name]           Date of Birth: [birth year]           MRN: [id number] Visit Date: 11/26/2017 Requested by: Loletta SpecterGomez, Roger David, PA-C [address] [place], [place] [address] PCP: Denny LevyGomez, Roger David, PA-C  Subjective: Chief Complaint  Patient presents with  . Right Knee - Follow-up    HPI: [name] is a patient with end-stage right knee arthritis.  It is difficult for her to hold down a job because of her knee pain.  She has primarily valgus alignment but global knee pain.  She is had one injection in the past which helps her.  Her blood glucose is been under very good control.  She reports night pain and rest pain.  Would like to consider knee replacement surgery but I had a long discussion with her today about the risk and benefits and she may want to put that off.  I did discuss with her in detail about the grueling rehab that would be involved and how the pain would be worse before it would be better.  I am not sure the patient had a full understanding of the nature of knee replacement and how difficult it is to regain strength and range of motion.  Also told her that she would be at increased risk of complications because of her diabetes as well as her increased body mass index.              ROS: All systems reviewed are negative as they relate to the chief complaint within the history of present illness.  Patient denies  fevers or chills.   Assessment & Plan: Visit Diagnoses:  1. Unilateral primary osteoarthritis, right knee     Plan: Impression is right knee arthritis symptomatic and end-stage and the patient who is likely heading for total knee replacement at some time in the future.  She did see a dentist and has no dental issues at this time by her report.  We will try an aspiration and injection today into the knee for temporary pain relief per patient request.  I think when she is ready to tackle the rehab portion of this procedure then we  could consider knee replacement.  High likelihood that she would require revision in her lifetime.  Also encouraged quad strengthening exercises and weight loss.  Follow-Up Instructions: Return if symptoms worsen or fail to improve.   Orders:  No orders of the defined types were placed in this encounter.  Meds ordered this encounter  Medications  . traMADol (ULTRAM) 50 MG tablet    Sig: 1 po q bid prn pain    Dispense:  50 tablet    Refill:  0      Procedures: No procedures performed   Clinical Data: No additional findings.  Objective: Vital Signs: There were no vitals taken for this visit.  Physical Exam:   Constitutional: Patient appears well-developed HEENT:  Head: Normocephalic Eyes:EOM are normal Neck: Normal range of motion Cardiovascular: Normal rate Pulmonary/chest: Effort normal Neurologic: Patient is alert Skin: Skin is warm Psychiatric: Patient has normal mood and affect    Ortho Exam: Ortho exam demonstrates valgus alignment right lower extremity which is mild.  Pedal pulses palpable.  Ankle dorsiflexion plantarflexion intact.  No other masses lymph adenopathy or skin changes noted in that knee region.  No groin pain with internal/external rotation of the leg.  Range of motion is from  full extension to past 90 degrees of flexion.  Lateral joint line tenderness is present.  Specialty Comments:  No specialty comments available.  Imaging: No results found.   PMFS History: Patient Active Problem List   Diagnosis Date Noted  . BIH (benign intracranial hypertension) 11/06/2017  . Somnolence, daytime 07/30/2017  . Snoring 07/30/2017  . Morbid obesity (HCC) 07/30/2017  . Migraine 10/02/2016  . Common migraine with intractable migraine 10/02/2016  . Type 2 diabetes mellitus without complication, without long-term current use of insulin (HCC) 08/17/2016   Past Medical History:  Diagnosis Date  . Anxiety   . Common migraine with intractable migraine  10/02/2016  . Depression   . Diabetes mellitus   . Hypertension     Family History  Problem Relation Age of Onset  . Diabetes Mother   . Hypertension Mother   . Renal Disease Mother   . Cancer Father     Past Surgical History:  Procedure Laterality Date  . BTL     Social History   Occupational History  . Not on file  Tobacco Use  . Smoking status: Current Every Day Smoker    Packs/day: 0.24    Types: Cigarettes  . Smokeless tobacco: Never Used  Substance and Sexual Activity  . Alcohol use: No  . Drug use: No  . Sexual activity: Yes    Birth control/protection: None

## 2017-11-28 ENCOUNTER — Ambulatory Visit: Payer: Medicaid Other | Admitting: Neurology

## 2017-11-28 DIAGNOSIS — Z0271 Encounter for disability determination: Secondary | ICD-10-CM

## 2017-12-03 ENCOUNTER — Other Ambulatory Visit (INDEPENDENT_AMBULATORY_CARE_PROVIDER_SITE_OTHER): Payer: Self-pay | Admitting: Physician Assistant

## 2017-12-03 DIAGNOSIS — G43909 Migraine, unspecified, not intractable, without status migrainosus: Secondary | ICD-10-CM

## 2017-12-03 DIAGNOSIS — E785 Hyperlipidemia, unspecified: Secondary | ICD-10-CM

## 2017-12-03 NOTE — Telephone Encounter (Signed)
FWD to PCP. [name], CMA  

## 2017-12-05 DIAGNOSIS — G43909 Migraine, unspecified, not intractable, without status migrainosus: Secondary | ICD-10-CM

## 2017-12-06 ENCOUNTER — Ambulatory Visit (INDEPENDENT_AMBULATORY_CARE_PROVIDER_SITE_OTHER): Payer: Medicaid Other | Admitting: Physician Assistant

## 2017-12-06 ENCOUNTER — Ambulatory Visit (INDEPENDENT_AMBULATORY_CARE_PROVIDER_SITE_OTHER): Payer: Medicaid Other | Admitting: Orthopedic Surgery

## 2017-12-11 ENCOUNTER — Other Ambulatory Visit (INDEPENDENT_AMBULATORY_CARE_PROVIDER_SITE_OTHER): Payer: Self-pay

## 2017-12-11 DIAGNOSIS — Z79899 Other long term (current) drug therapy: Secondary | ICD-10-CM

## 2017-12-11 DIAGNOSIS — Z0289 Encounter for other administrative examinations: Secondary | ICD-10-CM

## 2017-12-11 NOTE — Telephone Encounter (Signed)
I called the patient.  We will get MRI and MRV evaluation.  Headaches continue.  The patient had blood work today to include a comprehensive metabolic profile

## 2017-12-11 NOTE — Telephone Encounter (Addendum)
Spoke with Dr. Lucia Gaskins ([facility] this am) since Dr. Anne Hahn out today. Received the following VO: can offer pt migraine infusion including depacon 1gm and toradol IV once. Also wants the following labs on her: CBC,CMP, acetazolamide level to have done prior to infusion.   Dr. Lucia Gaskins would like for Dr. Anne Hahn' return to place order for MRI/MRV tomorrow

## 2017-12-11 NOTE — Addendum Note (Signed)
Addended by: York Spaniel on: 12/11/2017 05:52 PM   Modules accepted: Orders

## 2017-12-11 NOTE — Telephone Encounter (Signed)
Pt has called asking to speak with Dr Anne Hahn, she was informed he is not in the office today.  Pt is asking that RN Kara Mead calls her re: her head beginning to hurt since Sunday.  Please call

## 2017-12-11 NOTE — Telephone Encounter (Signed)
I called patient back. Verified she is taking diamox 500mg  in the morning and 1000mg  in the evening. Her headaches started back this past Sunday. She woke up with it. She received CPAP machine and started last Thursday. She tried taking BC powder and goodie powder last night. Stopped for a little while and came right back per pt. Yesterday it was severe. Today, still hurting. Took two BC powder this morning. I cautioned pt that this can cause rebound headaches and to use very sparingly. Recommended she drink plenty of fluids, get rest and decrease stimulating actitivities such as TV, cell phone use. Advised Dr. Anne Hahn out of the office. Will speak with [facility] to see what they recommend. I see where Dr. Johnnette Gourd recommended MRI brain w/ MRV eval. We will call back to advise. She verbalized understanding.

## 2017-12-11 NOTE — Addendum Note (Signed)
Addended by: Brooke Dare, EMMA L on: 12/11/2017 11:00 AM   Modules accepted: Orders

## 2017-12-11 NOTE — Telephone Encounter (Addendum)
I called pt. Advised I spoke with [name] and she can fit her in at 1:30pm or 3:00pm. Advised her to bring a driver. Was going to give her IV depacon 1 gram and toradol IV once.  Advised Dr. Lucia Gaskins also would like her to have labs prior to infusion. Advised her to ask to get labs and then infusion. Pt verbalized understanding and will come at 3pm.  Placed order for labs in Epic and placed pt on lab schedule. Gave signed orders by Dr. Lucia Gaskins to [name] in intrafusion for migraine infusion.

## 2017-12-11 NOTE — Telephone Encounter (Signed)
Dr. Anne Hahn- would you like to order MRI/MRV? I saw you recommended this last time you spoke with pt. Dr. Lucia Gaskins wanted to wait until you returned to office for this

## 2017-12-12 ENCOUNTER — Telehealth: Payer: Self-pay | Admitting: Neurology

## 2017-12-12 NOTE — Telephone Encounter (Signed)
I called the patient.  The patient has chronic elevations of white blood count, she is not anemic.  Etiology of this is not clear.  The patient has chronic elevations in the alkaline phosphatase and ALT enzyme, not out of portion to what was seen in the past.  The patient will be getting MRI of the brain evaluation.

## 2017-12-12 NOTE — Telephone Encounter (Signed)
Medicaid order sent to GI. They will obtain the auth and will reach out to the pt to schedule.  °

## 2017-12-20 LAB — COMPREHENSIVE METABOLIC PANEL
A/G RATIO: 1.4 (ref 1.2–2.2)
ALT: 70 IU/L — AB (ref 0–32)
AST: 28 IU/L (ref 0–40)
Albumin: 4.4 g/dL (ref 3.5–5.5)
Alkaline Phosphatase: 154 IU/L — ABNORMAL HIGH (ref 39–117)
BUN/Creatinine Ratio: 15 (ref 9–23)
BUN: 12 mg/dL (ref 6–24)
CO2: 21 mmol/L (ref 20–29)
Calcium: 10.2 mg/dL (ref 8.7–10.2)
Chloride: 107 mmol/L — ABNORMAL HIGH (ref 96–106)
Creatinine, Ser: 0.79 mg/dL (ref 0.57–1.00)
GFR calc non Af Amer: 88 mL/min/{1.73_m2} (ref >59)
GFR, EST AFRICAN AMERICAN: 102 mL/min/{1.73_m2} (ref >59)
Globulin, Total: 3.1 g/dL (ref 1.5–4.5)
Glucose: 178 mg/dL — ABNORMAL HIGH (ref 65–99)
POTASSIUM: 4.4 mmol/L (ref 3.5–5.2)
Sodium: 143 mmol/L (ref 134–144)
TOTAL PROTEIN: 7.5 g/dL (ref 6.0–8.5)

## 2017-12-20 LAB — CBC WITH DIFFERENTIAL/PLATELET
BASOS ABS: 0 10*3/uL (ref 0.0–0.2)
Basos: 0 %
EOS (ABSOLUTE): 0.2 10*3/uL (ref 0.0–0.4)
Eos: 1 %
Hematocrit: 43.1 % (ref 34.0–46.6)
Hemoglobin: 13.9 g/dL (ref 11.1–15.9)
IMMATURE GRANS (ABS): 0 10*3/uL (ref 0.0–0.1)
Immature Granulocytes: 0 %
LYMPHS: 33 %
Lymphocytes Absolute: 5.2 10*3/uL — ABNORMAL HIGH (ref 0.7–3.1)
MCH: 24.9 pg — AB (ref 26.6–33.0)
MCHC: 32.3 g/dL (ref 31.5–35.7)
MCV: 77 fL — ABNORMAL LOW (ref 79–97)
Monocytes Absolute: 0.8 10*3/uL (ref 0.1–0.9)
Monocytes: 5 %
NEUTROS ABS: 9.6 10*3/uL — AB (ref 1.4–7.0)
Neutrophils: 61 %
PLATELETS: 369 10*3/uL (ref 150–450)
RBC: 5.58 x10E6/uL — ABNORMAL HIGH (ref 3.77–5.28)
RDW: 14.8 % (ref 12.3–15.4)
WBC: 15.8 10*3/uL — ABNORMAL HIGH (ref 3.4–10.8)

## 2017-12-20 LAB — ACETAZOLAMIDE, (DIAMOX), S/P: ACETAZOLAMIDE: 1.2 ug/mL — AB

## 2017-12-24 ENCOUNTER — Ambulatory Visit
Admission: RE | Admit: 2017-12-24 | Discharge: 2017-12-24 | Disposition: A | Discharge status: Home / Self Care | Payer: Medicaid Other | Source: Ambulatory Visit | Attending: Neurology | Admitting: Neurology

## 2017-12-24 ENCOUNTER — Telehealth: Payer: Self-pay | Admitting: *Deleted

## 2017-12-24 ENCOUNTER — Encounter: Payer: Self-pay | Admitting: Neurology

## 2017-12-24 DIAGNOSIS — G4489 Other headache syndrome: Secondary | ICD-10-CM

## 2017-12-24 MED ORDER — GADOBENATE DIMEGLUMINE 529 MG/ML IV SOLN
20.0000 mL | Freq: Once | INTRAVENOUS | Status: AC | PRN
  Administered 2017-12-24: 20 mL via INTRAVENOUS

## 2017-12-24 NOTE — Telephone Encounter (Signed)
The patient apparently has a court case [place] in [place] that she needs to go to, she claims that she cannot because of her ongoing headaches, she needs a letter stating that she is having ongoing treatment for headaches, I will provide this.

## 2017-12-24 NOTE — Telephone Encounter (Signed)
Pt want Dr. Anne HahnWillis to give her a call. [phone]

## 2017-12-25 ENCOUNTER — Telehealth: Payer: Self-pay | Admitting: Neurology

## 2017-12-25 MED ORDER — POTASSIUM CHLORIDE ER 10 MEQ PO TBCR: 20.0000 meq | EXTENDED_RELEASE_TABLET | Freq: Every day | ORAL | 3 refills | Status: DC

## 2017-12-25 MED ORDER — FUROSEMIDE 20 MG PO TABS: ORAL_TABLET | ORAL | 3 refills | Status: DC

## 2017-12-25 NOTE — Telephone Encounter (Signed)
I called, LVM for pt letting her know letter ready for pick up.

## 2017-12-25 NOTE — Telephone Encounter (Signed)
I called the patient.  The patient is having ongoing headaches.  She is on Diamox taking 500 mg, 3 tablets daily, she is not getting any relief, she is only having about 2 days a week without headache.  She is on propranolol and nortriptyline for the headache also without benefit.  The patient has had MRI of the brain that is relatively unremarkable exception some chronic sinus issues, MRV is normal.  We will taper off the Diamox by 500 mg every week until off the drug, I will start Lasix 20 mg daily for a week and then go to 40 mg daily.  The patient will also go on potassium supplementation.   MRV head 12/24/17:  IMPRESSION: This is a normal MR venogram of the intracranial veins and sinuses.  There is no occlusion or significant stenosis noted.  The left transverse and sigmoid sinuses are larger than the right, a normal variant.    MRI brain 12/24/17:  IMPRESSION:  This MRI of the brain with and without contrast shows the following: 1.   The brain appears normal for age. 2.   Chronic maxillary and ethmoid sinusitis. 3.   There is a normal enhancement pattern and there are no acute findings.

## 2017-12-25 NOTE — Telephone Encounter (Signed)
Pt called stating she would like a call once letter in ready for pick up. (spoke with front desk advised they did not have it at this time)

## 2018-01-02 ENCOUNTER — Encounter (INDEPENDENT_AMBULATORY_CARE_PROVIDER_SITE_OTHER): Payer: Self-pay | Admitting: Physician Assistant

## 2018-01-02 ENCOUNTER — Ambulatory Visit (INDEPENDENT_AMBULATORY_CARE_PROVIDER_SITE_OTHER): Payer: Medicaid Other | Admitting: Physician Assistant

## 2018-01-02 ENCOUNTER — Other Ambulatory Visit: Payer: Self-pay

## 2018-01-02 VITALS — BP 131/81 | HR 83 | Temp 98.0°F | Ht 65.5 in | Wt 258.4 lb

## 2018-01-02 DIAGNOSIS — D72829 Elevated white blood cell count, unspecified: Secondary | ICD-10-CM

## 2018-01-02 DIAGNOSIS — Z760 Encounter for issue of repeat prescription: Secondary | ICD-10-CM

## 2018-01-02 DIAGNOSIS — E119 Type 2 diabetes mellitus without complications: Secondary | ICD-10-CM | POA: Diagnosis not present

## 2018-01-02 DIAGNOSIS — Z23 Encounter for immunization: Secondary | ICD-10-CM | POA: Diagnosis not present

## 2018-01-02 DIAGNOSIS — F411 Generalized anxiety disorder: Secondary | ICD-10-CM

## 2018-01-02 LAB — POCT GLYCOSYLATED HEMOGLOBIN (HGB A1C): Hemoglobin A1C: 7 % — AB (ref 4.0–5.6)

## 2018-01-02 MED ORDER — INSULIN ASPART 100 UNIT/ML ~~LOC~~ SOLN: 10.0000 [IU] | Freq: Three times a day (TID) | SUBCUTANEOUS | 11 refills | Status: DC

## 2018-01-02 MED ORDER — INSULIN DETEMIR 100 UNIT/ML FLEXPEN: 50.0000 [IU] | PEN_INJECTOR | Freq: Two times a day (BID) | SUBCUTANEOUS | 11 refills | Status: DC

## 2018-01-02 MED ORDER — ACCU-CHEK SOFT TOUCH LANCETS MISC: 12 refills | Status: DC

## 2018-01-02 MED ORDER — CLONAZEPAM 1 MG PO TABS: 1.0000 mg | ORAL_TABLET | Freq: Every day | ORAL | 0 refills | Status: DC

## 2018-01-02 MED ORDER — GLIMEPIRIDE 4 MG PO TABS: 4.0000 mg | ORAL_TABLET | Freq: Every day | ORAL | 1 refills | Status: DC

## 2018-01-02 MED ORDER — HYDROCHLOROTHIAZIDE 25 MG PO TABS: 25.0000 mg | ORAL_TABLET | Freq: Every day | ORAL | 1 refills | Status: DC

## 2018-01-02 MED ORDER — METFORMIN HCL 1000 MG PO TABS: 1000.0000 mg | ORAL_TABLET | Freq: Two times a day (BID) | ORAL | 3 refills | Status: DC

## 2018-01-02 MED ORDER — GLUCOSE BLOOD VI STRP: ORAL_STRIP | 12 refills | Status: DC

## 2018-01-02 NOTE — Progress Notes (Signed)
Subjective:  Patient ID: [name], female    DOB: [birth year]  Age: 47 y.o. MRN: [id number]  CC: anxiety. Clonazepam refill   HPI [name] a 47 y.o.femalewith a PMH of anxiety, depression, DM2, OSA, and HTN presents with anxiety. Last seen here five months ago for multiple complaints, one of which was anxiety. She was prescribed escitalopram 20 mg and Clonazepam 1 mg BID PRN #30. Was instructed to return in one month but never did. Pt says she was busy with neurology and orthopedics. She says she had a panic attack when she was in the MRI for her headaches. Has noticed her anxiety has been recurring ever since the MRI. Estimates approximately 5-6 panic attacks per week. She also notices anxiety around crowds. PHQ9 17 and GAD7 9. Taking Nortriptyline for headache control. Stopped taking escitalopram 20 mg as she felt it was ineffective. She has not seen a psychiatrist yet as she did not have insurance or coverage. She agrees to see psychiatrist now that she has insurance coverage.     Pt was incidentally found to have leukocytosis of 15.8 K/uL on 12/11/17. Pt states she was ill with a sinus infection. Feeling better in regards to sinusitis.    Outpatient Medications Prior to Visit  Medication Sig Dispense Refill  . beclomethasone (QVAR) 40 MCG/ACT inhaler Inhale 2 puffs into the lungs 2 (two) times daily.    . Butalbital-APAP-Caffeine (FIORICET) 50-300-40 MG CAPS Take 1 capsule by mouth 3 (three) times daily as needed. 60 capsule 0  . escitalopram (LEXAPRO) 20 MG tablet Take 1 tablet (20 mg total) by mouth daily. 30 tablet 5  . furosemide (LASIX) 20 MG tablet 1 tablet daily for a week and then take 2 tablets daily 60 tablet 3  . glimepiride (AMARYL) 4 MG tablet Take 1 tablet (4 mg total) by mouth daily before breakfast. 90 tablet 1  . hydrochlorothiazide (HYDRODIURIL) 25 MG tablet TAKE 1 TABLET (25 MG TOTAL) BY MOUTH DAILY. 90 tablet 1  . insulin aspart (NOVOLOG) 100 UNIT/ML  injection Inject 10 Units into the skin 3 (three) times daily before meals. 10 mL 11  . lactulose (CHRONULAC) 10 GM/15ML solution Take 30 mLs (20 g total) by mouth 2 (two) times daily as needed for mild constipation. 236 mL 3  . metFORMIN (GLUCOPHAGE) 1000 MG tablet Take 1 tablet (1,000 mg total) by mouth 2 (two) times daily with a meal. 180 tablet 3  . nortriptyline (PAMELOR) 25 MG capsule Take 2 capsules (50 mg total) by mouth at bedtime. 60 capsule 3  . potassium chloride (K-DUR) 10 MEQ tablet Take 2 tablets (20 mEq total) by mouth daily. 60 tablet 3  . pravastatin (PRAVACHOL) 10 MG tablet TAKE 1 TABLET (10 MG TOTAL) BY MOUTH DAILY. 90 tablet 1  . propranolol (INDERAL) 20 MG tablet 1 tablet twice daily for 2 weeks and then go to 2 tablets twice daily 120 tablet 3  . PROVENTIL HFA 108 (90 Base) MCG/ACT inhaler Inhale 1-2 puffs into the lungs every 6 (six) hours as needed for wheezing or shortness of breath. 1 Inhaler 5  . aspirin EC 81 MG tablet Take 81 mg by mouth every 6 (six) hours as needed for mild pain.     . clonazePAM (KLONOPIN) 1 MG tablet Take 1 tablet (1 mg total) by mouth 2 (two) times daily as needed for anxiety. (Patient not taking: Reported on 01/02/2018) 30 tablet 0  . docusate sodium (COLACE) 100 MG capsule Take 100 mg by  mouth daily as needed.     Marland Kitchen EASY COMFORT PEN NEEDLES 31G X 5 MM MISC USE AS INSTRUCTED. TWICE DAILY. 100 each 1  . gabapentin (NEURONTIN) 300 MG capsule TAKE 1 CAPSULE (300 MG TOTAL) BY MOUTH 3 (THREE) TIMES DAILY. (Patient not taking: Reported on 01/02/2018) 90 capsule 3  . glucose blood (ACCU-CHEK AVIVA PLUS) test strip Use as instructed 100 each 12  . Insulin Detemir (LEVEMIR) 100 UNIT/ML Pen Inject 50 Units into the skin 2 (two) times daily. 15 mL 11  . Lancets (ACCU-CHEK SOFT TOUCH) lancets Use as instructed 100 each 12  . megestrol (MEGACE) 40 MG tablet TAKE 1 TABLET (40 MG TOTAL) BY MOUTH 2 (TWO) TIMES DAILY. (Patient not taking: Reported on 01/02/2018) 60  tablet 1  . topiramate (TOPAMAX) 25 MG tablet Take 25 mg by mouth.  3  . traMADol (ULTRAM) 50 MG tablet 1 po q bid prn pain (Patient not taking: Reported on 01/02/2018) 50 tablet 0  . cetirizine (ZYRTEC) 10 MG tablet Take 1 tablet (10 mg total) by mouth daily. 30 tablet 0  . metoCLOPramide (REGLAN) 5 MG tablet Take 1 tablet (5 mg total) by mouth 3 (three) times daily before meals. 45 tablet 0  . sulindac (CLINORIL) 150 MG tablet TAKE 1 TABLET (150 MG TOTAL) BY MOUTH 2 (TWO) TIMES DAILY. 60 tablet 0   No facility-administered medications prior to visit.      ROS Review of Systems  Constitutional: Negative for chills, fever and malaise/fatigue.  Eyes: Negative for blurred vision.  Respiratory: Negative for shortness of breath.   Cardiovascular: Negative for chest pain and palpitations.  Gastrointestinal: Negative for abdominal pain and nausea.  Genitourinary: Negative for dysuria and hematuria.  Musculoskeletal: Negative for joint pain and myalgias.  Skin: Negative for rash.  Neurological: Negative for tingling and headaches.  Psychiatric/Behavioral: Negative for depression. The patient is nervous/anxious.     Objective:  BP 131/81 (BP Location: Left Arm, Patient Position: Sitting, Cuff Size: Large)   Pulse 83   Temp 98 F (36.7 C) (Oral)   Ht 5' 5.5" (1.664 m)   Wt 258 lb 6.4 oz (117.2 kg)   SpO2 97%   BMI 42.35 kg/m   BP/Weight 01/02/2018 11/23/2017 11/06/2017  Systolic BP 131 135 118  Diastolic BP 81 96 77  Wt. (Lbs) 258.4 - 253  BMI 42.35 - 41.46      Physical Exam  Constitutional: She is oriented to person, place, and time.  Well developed, well nourished, NAD, polite  HENT:  Head: Normocephalic and atraumatic.  Eyes: No scleral icterus.  Neck: Normal range of motion. Neck supple. No thyromegaly present.  Cardiovascular: Normal rate, regular rhythm and normal heart sounds.  Pulmonary/Chest: Effort normal and breath sounds normal.  Musculoskeletal: She exhibits no  edema.  Neurological: She is alert and oriented to person, place, and time.  Skin: Skin is warm and dry. No rash noted. No erythema. No pallor.  Psychiatric: She has a normal mood and affect. Her behavior is normal. Thought content normal.  Vitals reviewed.    Assessment & Plan:    1. Type 2 diabetes mellitus without complication, without long-term current use of insulin (HCC) - HgB A1c 7.0% down from 8.2% five months ago. - Refill glimepiride (AMARYL) 4 MG tablet; Take 1 tablet (4 mg total) by mouth daily before breakfast.  Dispense: 90 tablet; Refill: 1 - Refill glucose blood (ACCU-CHEK AVIVA PLUS) test strip; Use as instructed  Dispense: 100 each; Refill: 12 -  Refill insulin aspart (NOVOLOG) 100 UNIT/ML injection; Inject 10 Units into the skin 3 (three) times daily before meals.  Dispense: 10 mL; Refill: 11 - Refill Insulin Detemir (LEVEMIR) 100 UNIT/ML Pen; Inject 50 Units into the skin 2 (two) times daily.  Dispense: 15 mL; Refill: 11 - Refill Lancets (ACCU-CHEK SOFT TOUCH) lancets; Use as instructed  Dispense: 100 each; Refill: 12 - Refill metFORMIN (GLUCOPHAGE) 1000 MG tablet; Take 1 tablet (1,000 mg total) by mouth 2 (two) times daily with a meal.  Dispense: 180 tablet; Refill: 3  2. Generalized anxiety disorder - Ambulatory referral to Psychiatry  3. Leukocytosis, unspecified type - CBC with Differential  4. Need for Tdap vaccination - Tdap vaccine greater than or equal to 7yo IM  5. Medication refill - hydrochlorothiazide (HYDRODIURIL) 25 MG tablet; Take 1 tablet (25 mg total) by mouth daily.  Dispense: 90 tablet; Refill: 1   Meds ordered this encounter  Medications  . clonazePAM (KLONOPIN) 1 MG tablet    Sig: Take 1 tablet (1 mg total) by mouth daily.    Dispense:  30 tablet    Refill:  0    Order Specific Question:   Supervising Provider    Answer:   Hoy Register [[id number]]  . glimepiride (AMARYL) 4 MG tablet    Sig: Take 1 tablet (4 mg total) by mouth daily  before breakfast.    Dispense:  90 tablet    Refill:  1    Order Specific Question:   Supervising Provider    Answer:   Hoy Register [[id number]]  . glucose blood (ACCU-CHEK AVIVA PLUS) test strip    Sig: Use as instructed    Dispense:  100 each    Refill:  12    Order Specific Question:   Supervising Provider    Answer:   Hoy Register [[id number]]  . hydrochlorothiazide (HYDRODIURIL) 25 MG tablet    Sig: Take 1 tablet (25 mg total) by mouth daily.    Dispense:  90 tablet    Refill:  1    Order Specific Question:   Supervising Provider    Answer:   Hoy Register [[id number]]  . insulin aspart (NOVOLOG) 100 UNIT/ML injection    Sig: Inject 10 Units into the skin 3 (three) times daily before meals.    Dispense:  10 mL    Refill:  11    Order Specific Question:   Supervising Provider    Answer:   Hoy Register [[id number]]  . Insulin Detemir (LEVEMIR) 100 UNIT/ML Pen    Sig: Inject 50 Units into the skin 2 (two) times daily.    Dispense:  15 mL    Refill:  11    Order Specific Question:   Supervising Provider    Answer:   Hoy Register [[id number]]  . Lancets (ACCU-CHEK SOFT TOUCH) lancets    Sig: Use as instructed    Dispense:  100 each    Refill:  12    Order Specific Question:   Supervising Provider    Answer:   Hoy Register [[id number]]  . metFORMIN (GLUCOPHAGE) 1000 MG tablet    Sig: Take 1 tablet (1,000 mg total) by mouth 2 (two) times daily with a meal.    Dispense:  180 tablet    Refill:  3    Order Specific Question:   Supervising Provider    Answer:   Hoy Register [[id number]]    Follow-up: Return in about 4 weeks (around 01/30/2018) for depression with anxiety.   Roger  Roderic Ovens PA

## 2018-01-02 NOTE — Patient Instructions (Signed)

## 2018-01-03 ENCOUNTER — Other Ambulatory Visit: Payer: Self-pay | Admitting: Obstetrics & Gynecology

## 2018-01-03 ENCOUNTER — Other Ambulatory Visit (INDEPENDENT_AMBULATORY_CARE_PROVIDER_SITE_OTHER): Payer: Self-pay | Admitting: Orthopedic Surgery

## 2018-01-03 ENCOUNTER — Other Ambulatory Visit (INDEPENDENT_AMBULATORY_CARE_PROVIDER_SITE_OTHER): Payer: Self-pay | Admitting: Physician Assistant

## 2018-01-03 DIAGNOSIS — J45909 Unspecified asthma, uncomplicated: Secondary | ICD-10-CM

## 2018-01-03 DIAGNOSIS — N939 Abnormal uterine and vaginal bleeding, unspecified: Secondary | ICD-10-CM

## 2018-01-03 LAB — CBC WITH DIFFERENTIAL/PLATELET
BASOS ABS: 0 10*3/uL (ref 0.0–0.2)
Basos: 0 %
EOS (ABSOLUTE): 0.2 10*3/uL (ref 0.0–0.4)
Eos: 1 %
Hematocrit: 40.3 % (ref 34.0–46.6)
Hemoglobin: 13.3 g/dL (ref 11.1–15.9)
Immature Grans (Abs): 0 10*3/uL (ref 0.0–0.1)
Immature Granulocytes: 0 %
LYMPHS ABS: 5 10*3/uL — AB (ref 0.7–3.1)
LYMPHS: 34 %
MCH: 25 pg — ABNORMAL LOW (ref 26.6–33.0)
MCHC: 33 g/dL (ref 31.5–35.7)
MCV: 76 fL — ABNORMAL LOW (ref 79–97)
Monocytes Absolute: 0.8 10*3/uL (ref 0.1–0.9)
Monocytes: 5 %
Neutrophils Absolute: 8.8 10*3/uL — ABNORMAL HIGH (ref 1.4–7.0)
Neutrophils: 60 %
PLATELETS: 301 10*3/uL (ref 150–450)
RBC: 5.31 x10E6/uL — ABNORMAL HIGH (ref 3.77–5.28)
RDW: 14.9 % (ref 12.3–15.4)
WBC: 14.9 10*3/uL — ABNORMAL HIGH (ref 3.4–10.8)

## 2018-01-03 NOTE — Telephone Encounter (Signed)
FWD to PCP. [name] S Cay Kath, CMA  

## 2018-01-03 NOTE — Telephone Encounter (Signed)
y

## 2018-01-03 NOTE — Telephone Encounter (Signed)
Ok to rf? 

## 2018-01-04 ENCOUNTER — Telehealth (INDEPENDENT_AMBULATORY_CARE_PROVIDER_SITE_OTHER): Payer: Self-pay

## 2018-01-04 NOTE — Telephone Encounter (Signed)
Patient is aware that WBC are persistently elevated she has scheduled to return on 01/09/18. Maryjean Morn, CMA

## 2018-01-04 NOTE — Telephone Encounter (Signed)
-----   Message from Loletta Specter, PA-C sent at 01/03/2018  6:33 PM EDT ----- Pt has persistently elevated white blood cells. She should return within one week for further testing.

## 2018-01-07 ENCOUNTER — Encounter

## 2018-01-08 ENCOUNTER — Encounter: Payer: Self-pay | Admitting: Physician Assistant

## 2018-01-09 ENCOUNTER — Ambulatory Visit (INDEPENDENT_AMBULATORY_CARE_PROVIDER_SITE_OTHER): Payer: Medicaid Other | Admitting: Physician Assistant

## 2018-01-09 ENCOUNTER — Encounter (INDEPENDENT_AMBULATORY_CARE_PROVIDER_SITE_OTHER): Payer: Self-pay | Admitting: Physician Assistant

## 2018-01-09 ENCOUNTER — Other Ambulatory Visit: Payer: Self-pay

## 2018-01-09 ENCOUNTER — Other Ambulatory Visit (HOSPITAL_COMMUNITY)
Admission: RE | Admit: 2018-01-09 | Discharge: 2018-01-09 | Disposition: A | Discharge status: Home / Self Care | Payer: Medicaid Other | Source: Ambulatory Visit | Attending: Physician Assistant | Admitting: Physician Assistant

## 2018-01-09 VITALS — BP 116/79 | HR 78 | Temp 97.8°F | Ht 65.5 in | Wt 256.8 lb

## 2018-01-09 DIAGNOSIS — D72829 Elevated white blood cell count, unspecified: Secondary | ICD-10-CM | POA: Diagnosis present

## 2018-01-09 LAB — POCT URINALYSIS DIPSTICK
BILIRUBIN UA: NEGATIVE
Glucose, UA: NEGATIVE
Ketones, UA: NEGATIVE
LEUKOCYTES UA: NEGATIVE
Nitrite, UA: NEGATIVE
PROTEIN UA: NEGATIVE
RBC UA: NEGATIVE
Spec Grav, UA: 1.015 (ref 1.010–1.025)
Urobilinogen, UA: 0.2 E.U./dL
pH, UA: 5 (ref 5.0–8.0)

## 2018-01-09 NOTE — Patient Instructions (Signed)
Leukocytosis Leukocytosis means that a person has more white blood cells than normal. White blood cells are made in the bone marrow. Bone marrow is the spongy tissue inside of bones. The main job of white blood cells is to fight infection. Having too many white blood cells is a common condition. It can develop as a result of many types of medical problems. What are the causes? This condition may be caused by various problems. In some cases, the bone marrow is normal but it is still making too many white blood cells. This could be the result of:  Infection.  Injury.  Physical stress.  Emotional stress.  Surgery.  Allergic reactions.  Tumors that do not start in the blood or bone marrow.  An inherited disease.  Certain medicines.  Pregnancy and labor.  In other cases, a person may have a bone marrow disorder that is causing the body to make too many white blood cells. Bone marrow disorders include:  Leukemia. This is a type of blood cancer.  Myeloproliferative disorders. These disorders cause blood cells to grow abnormally.  What are the signs or symptoms? Often, this condition causes no symptoms. Some people may have symptoms due to the medical problem that is causing their leukocytosis. These symptoms may include:  Bleeding.  Bruising.  Fever.  Night sweats.  Repeated infections.  Weakness.  Weight loss.  How is this diagnosed? This condition is diagnosed with blood tests. It is often found when blood is tested as part of a routine physical exam. You may have other tests to help determine why you have too many white blood cells. These tests may include:  A complete blood count (CBC). This test measures all the types of blood cells in your body.  Chest X-rays, urine tests, or other tests to look for signs of infection.  Bone marrow aspiration. For this test, a needle is put into your bone. Cells from the bone marrow are removed through the needle, then they are  examined under a microscope.  Other tests on the blood or bone marrow sample.  How is this treated? Usually, treatment is not needed for leukocytosis. However, if a disorder is causing your leukocytosis, it will need to be treated. Treatment may include:  Antibiotic medicine if you have a bacterial infection.  Bone marrow transplant. This treatment replaces your diseased bone marrow with healthy cells that will grow new bone marrow.  Chemotherapy or biological therapies such as the use of antibodies. These treatments may be used to kill cancer cells or to decrease the number of white blood cells.  Follow these instructions at home: Medicines  Take over-the-counter and prescription medicines only as told by your health care provider.  If you were prescribed an antibiotic medicine, take it as told by your health care provider. Do not stop taking the antibiotic even if you start to feel better. Eating and drinking  Eat foods that are low in saturated fats and high in fiber. Eat plenty of fruits and vegetables.  Drink enough fluid to keep your urine clear or pale yellow.  Limit your intake of caffeine and alcohol. General instructions  Maintain a healthy weight. Ask your health care provider what weight is best for you.  Do 30 minutes of exercise at least 5 times each week. Check with your health care provider before you start a new exercise routine.  Do not use tobacco products, including cigarettes, chewing tobacco, or e-cigarettes. If you need help quitting, ask your health care   provider.  Keep all follow-up visits as told by your health care provider. This is important. Contact a health care provider if:  You feel weak or more tired than usual.  You develop chills, a cough, or nasal congestion.  You have a fever.  You lose weight without trying.  You have night sweats.  You bruise easily. Get help right away if:  You bleed more than normal.  You have chest  pain.  You have trouble breathing.  You have uncontrolled nausea or vomiting.  You feel dizzy or light-headed. This information is not intended to replace advice given to you by your health care provider. Make sure you discuss any questions you have with your health care provider. Document Released: 03/16/2011 Document Revised: 09/02/2015 Document Reviewed: 09/28/2014 Elsevier Interactive Patient Education  2018 [employer]   

## 2018-01-09 NOTE — Progress Notes (Signed)
Subjective:  Patient ID: [name], female    DOB: Feb 07, 1971  Age: 47 y.o. MRN: [id number]  CC:   HPI  [name] a 47 y.o.femalewith a PMH of anxiety, depression, DM2,OSA,and HTN presents on request of provider for work up of leukocytosis. Pt has four of the last five CBCs that reveal mild leukocytosis over the last year. Patient currently smokes. Aside from Qvar, patient not taking any glucocorticoids, catecholamines, lithium, or retinoic acids. Pt does not endorse any symptoms at the moment. Interesting to note, patient takes Diamox which can cause leukopenia yet patient's WBC counts are still mildly elevated.    Outpatient Medications Prior to Visit  Medication Sig Dispense Refill  . aspirin EC 81 MG tablet Take 81 mg by mouth every 6 (six) hours as needed for mild pain.     . beclomethasone (QVAR) 40 MCG/ACT inhaler Inhale 2 puffs into the lungs 2 (two) times daily.    . clonazePAM (KLONOPIN) 1 MG tablet Take 1 tablet (1 mg total) by mouth daily. 30 tablet 0  . docusate sodium (COLACE) 100 MG capsule Take 100 mg by mouth daily as needed.     Marland Kitchen EASY COMFORT PEN NEEDLES 31G X 5 MM MISC USE AS INSTRUCTED. TWICE DAILY. 100 each 1  . furosemide (LASIX) 20 MG tablet 1 tablet daily for a week and then take 2 tablets daily 60 tablet 3  . gabapentin (NEURONTIN) 300 MG capsule TAKE 1 CAPSULE (300 MG TOTAL) BY MOUTH 3 (THREE) TIMES DAILY. (Patient not taking: Reported on 01/02/2018) 90 capsule 3  . glimepiride (AMARYL) 4 MG tablet Take 1 tablet (4 mg total) by mouth daily before breakfast. 90 tablet 1  . glucose blood (ACCU-CHEK AVIVA PLUS) test strip Use as instructed 100 each 12  . hydrochlorothiazide (HYDRODIURIL) 25 MG tablet Take 1 tablet (25 mg total) by mouth daily. 90 tablet 1  . insulin aspart (NOVOLOG) 100 UNIT/ML injection Inject 10 Units into the skin 3 (three) times daily before meals. 10 mL 11  . Insulin Detemir (LEVEMIR) 100 UNIT/ML Pen Inject 50 Units into the skin  2 (two) times daily. 15 mL 11  . Lancets (ACCU-CHEK SOFT TOUCH) lancets Use as instructed 100 each 12  . megestrol (MEGACE) 40 MG tablet TAKE 1 TABLET (40 MG TOTAL) BY MOUTH 2 (TWO) TIMES DAILY. 60 tablet 1  . metFORMIN (GLUCOPHAGE) 1000 MG tablet Take 1 tablet (1,000 mg total) by mouth 2 (two) times daily with a meal. 180 tablet 3  . nortriptyline (PAMELOR) 25 MG capsule Take 2 capsules (50 mg total) by mouth at bedtime. 60 capsule 3  . potassium chloride (K-DUR) 10 MEQ tablet Take 2 tablets (20 mEq total) by mouth daily. 60 tablet 3  . pravastatin (PRAVACHOL) 10 MG tablet TAKE 1 TABLET (10 MG TOTAL) BY MOUTH DAILY. 90 tablet 1  . propranolol (INDERAL) 20 MG tablet 1 tablet twice daily for 2 weeks and then go to 2 tablets twice daily 120 tablet 3  . PROVENTIL HFA 108 (90 Base) MCG/ACT inhaler INHALE 1 TO 2 PUFFS BY MOUTH EVERY 6 (SIX) HOURS AS NEEDED FOR WHEEZING OR SHORTNESS OF BREATH. 6.7 g 5  . topiramate (TOPAMAX) 25 MG tablet Take 25 mg by mouth.  3  . traMADol (ULTRAM) 50 MG tablet TAKE ONE TABLET BY MOUTH TWICE DAILY AS NEEDED FOR PAIN 50 tablet 0   No facility-administered medications prior to visit.      ROS Review of Systems  Constitutional: Negative for  chills, fever and malaise/fatigue.  Eyes: Negative for blurred vision.  Respiratory: Negative for shortness of breath.   Cardiovascular: Negative for chest pain and palpitations.  Gastrointestinal: Negative for abdominal pain and nausea.  Genitourinary: Negative for dysuria and hematuria.  Musculoskeletal: Negative for joint pain and myalgias.  Skin: Negative for rash.  Neurological: Negative for tingling and headaches.  Psychiatric/Behavioral: Negative for depression. The patient is not nervous/anxious.     Objective:  Ht 5' 5.5" (1.664 m)   BMI 42.35 kg/m   BP/Weight 01/09/2018 01/02/2018 11/23/2017  Systolic BP - 131 135  Diastolic BP - 81 96  Wt. (Lbs) - 258.4 -  BMI 42.35 42.35 -      Physical Exam   Constitutional: She is oriented to person, place, and time.  Well developed, well nourished, NAD, polite  HENT:  Head: Normocephalic and atraumatic.  Eyes: No scleral icterus.  Neck: Normal range of motion. Neck supple. No thyromegaly present.  Pulmonary/Chest: Effort normal.  Musculoskeletal: She exhibits no edema.  Neurological: She is alert and oriented to person, place, and time.  Skin: Skin is warm and dry. No rash noted. No erythema. No pallor.  Psychiatric: She has a normal mood and affect. Her behavior is normal. Thought content normal.  Vitals reviewed.    Assessment & Plan:   1. Leukocytosis, unspecified type - Possibly attributed to smoking or Qvar use but work up is warranted.  - Urinalysis Dipstick negative - Urine cytology ancillary only - Pathologist smear review - C-reactive protein - Sedimentation Rate - TSH    Follow-up: Return if symptoms worsen or fail to improve, for Will call with results.   Loletta Specter PA

## 2018-01-10 LAB — PATHOLOGIST SMEAR REVIEW
BASOS ABS: 0 10*3/uL (ref 0.0–0.2)
Basos: 0 %
EOS (ABSOLUTE): 0.2 10*3/uL (ref 0.0–0.4)
Eos: 2 %
Hematocrit: 41.5 % (ref 34.0–46.6)
Hemoglobin: 13.6 g/dL (ref 11.1–15.9)
IMMATURE GRANS (ABS): 0 10*3/uL (ref 0.0–0.1)
Immature Granulocytes: 0 %
LYMPHS: 31 %
Lymphocytes Absolute: 3.2 10*3/uL — ABNORMAL HIGH (ref 0.7–3.1)
MCH: 24.6 pg — ABNORMAL LOW (ref 26.6–33.0)
MCHC: 32.8 g/dL (ref 31.5–35.7)
MCV: 75 fL — AB (ref 79–97)
MONOCYTES: 7 %
Monocytes Absolute: 0.7 10*3/uL (ref 0.1–0.9)
NEUTROS PCT: 60 %
Neutrophils Absolute: 6.2 10*3/uL (ref 1.4–7.0)
PLATELETS: 319 10*3/uL (ref 150–450)
RBC: 5.53 x10E6/uL — ABNORMAL HIGH (ref 3.77–5.28)
RDW: 15 % (ref 12.3–15.4)
WBC: 10.3 10*3/uL (ref 3.4–10.8)

## 2018-01-10 LAB — TSH: TSH: 4.84 u[IU]/mL — ABNORMAL HIGH (ref 0.450–4.500)

## 2018-01-10 LAB — SEDIMENTATION RATE: SED RATE: 50 mm/h — AB (ref 0–32)

## 2018-01-10 LAB — URINE CYTOLOGY ANCILLARY ONLY
Chlamydia: NEGATIVE
Neisseria Gonorrhea: NEGATIVE
Trichomonas: NEGATIVE

## 2018-01-10 LAB — C-REACTIVE PROTEIN: CRP: 9 mg/L (ref 0–10)

## 2018-01-11 ENCOUNTER — Other Ambulatory Visit (INDEPENDENT_AMBULATORY_CARE_PROVIDER_SITE_OTHER): Payer: Self-pay | Admitting: Physician Assistant

## 2018-01-11 ENCOUNTER — Telehealth (INDEPENDENT_AMBULATORY_CARE_PROVIDER_SITE_OTHER): Payer: Self-pay

## 2018-01-11 LAB — URINE CYTOLOGY ANCILLARY ONLY
BACTERIAL VAGINITIS: NEGATIVE
Candida vaginitis: NEGATIVE

## 2018-01-11 MED ORDER — NICOTINE 21 MG/24HR TD PT24: 21.0000 mg | MEDICATED_PATCH | Freq: Every day | TRANSDERMAL | 1 refills | Status: DC

## 2018-01-11 NOTE — Telephone Encounter (Signed)
Nicotine patches sent

## 2018-01-11 NOTE — Telephone Encounter (Signed)
Patient is aware that nicotine patches have been sent. Maryjean Morn, CMA

## 2018-01-11 NOTE — Telephone Encounter (Signed)
Patient is aware that hematology review thinks there may be thalassemia(abnormal hemoglobin). Will need hemoglobin testing to verify; may do at next OV on 10/23. Signs of chronic inflammation. Recommended she stop smoking and PCP will work up further at next visit. Patient wants to know if nicotine patches can be sent to her pharmacy now. Maryjean Morn, CMA

## 2018-01-11 NOTE — Telephone Encounter (Signed)
-----   Message from Loletta Specter, PA-C sent at 01/10/2018  5:56 PM EDT ----- Hematology review thinks perhaps there may be thalassemia which is an abnormal hemoglobin. Would need to have hemoglobin testing done to verify. May have done on her next appointment on 01/30/18. She has signs of chronic inflammation. I recommend she stop smoking and we can work up further in the next visit.

## 2018-01-14 ENCOUNTER — Telehealth (INDEPENDENT_AMBULATORY_CARE_PROVIDER_SITE_OTHER): Payer: Self-pay

## 2018-01-14 NOTE — Telephone Encounter (Signed)
-----   Message from Loletta Specter, PA-C sent at 01/14/2018  1:03 PM EDT ----- Negative for yeast and BV. I thought we ordered CT/GC/Trich on that sample. Please see if this was missed by cytology.

## 2018-01-14 NOTE — Telephone Encounter (Signed)
Patient is aware that yeast, bv, gonorrhea, chlamydia and trichomonas are all negative. Maryjean Morn, CMA

## 2018-01-18 IMAGING — DX DG CHEST 2V
2 series · 2 of 2 positions shown · non-contrast
Comparison: Chest x-ray dated 03/27/2011.

CLINICAL DATA: Cough for 2-3 weeks.

EXAM:
CHEST  2 VIEW

[w chest pa]
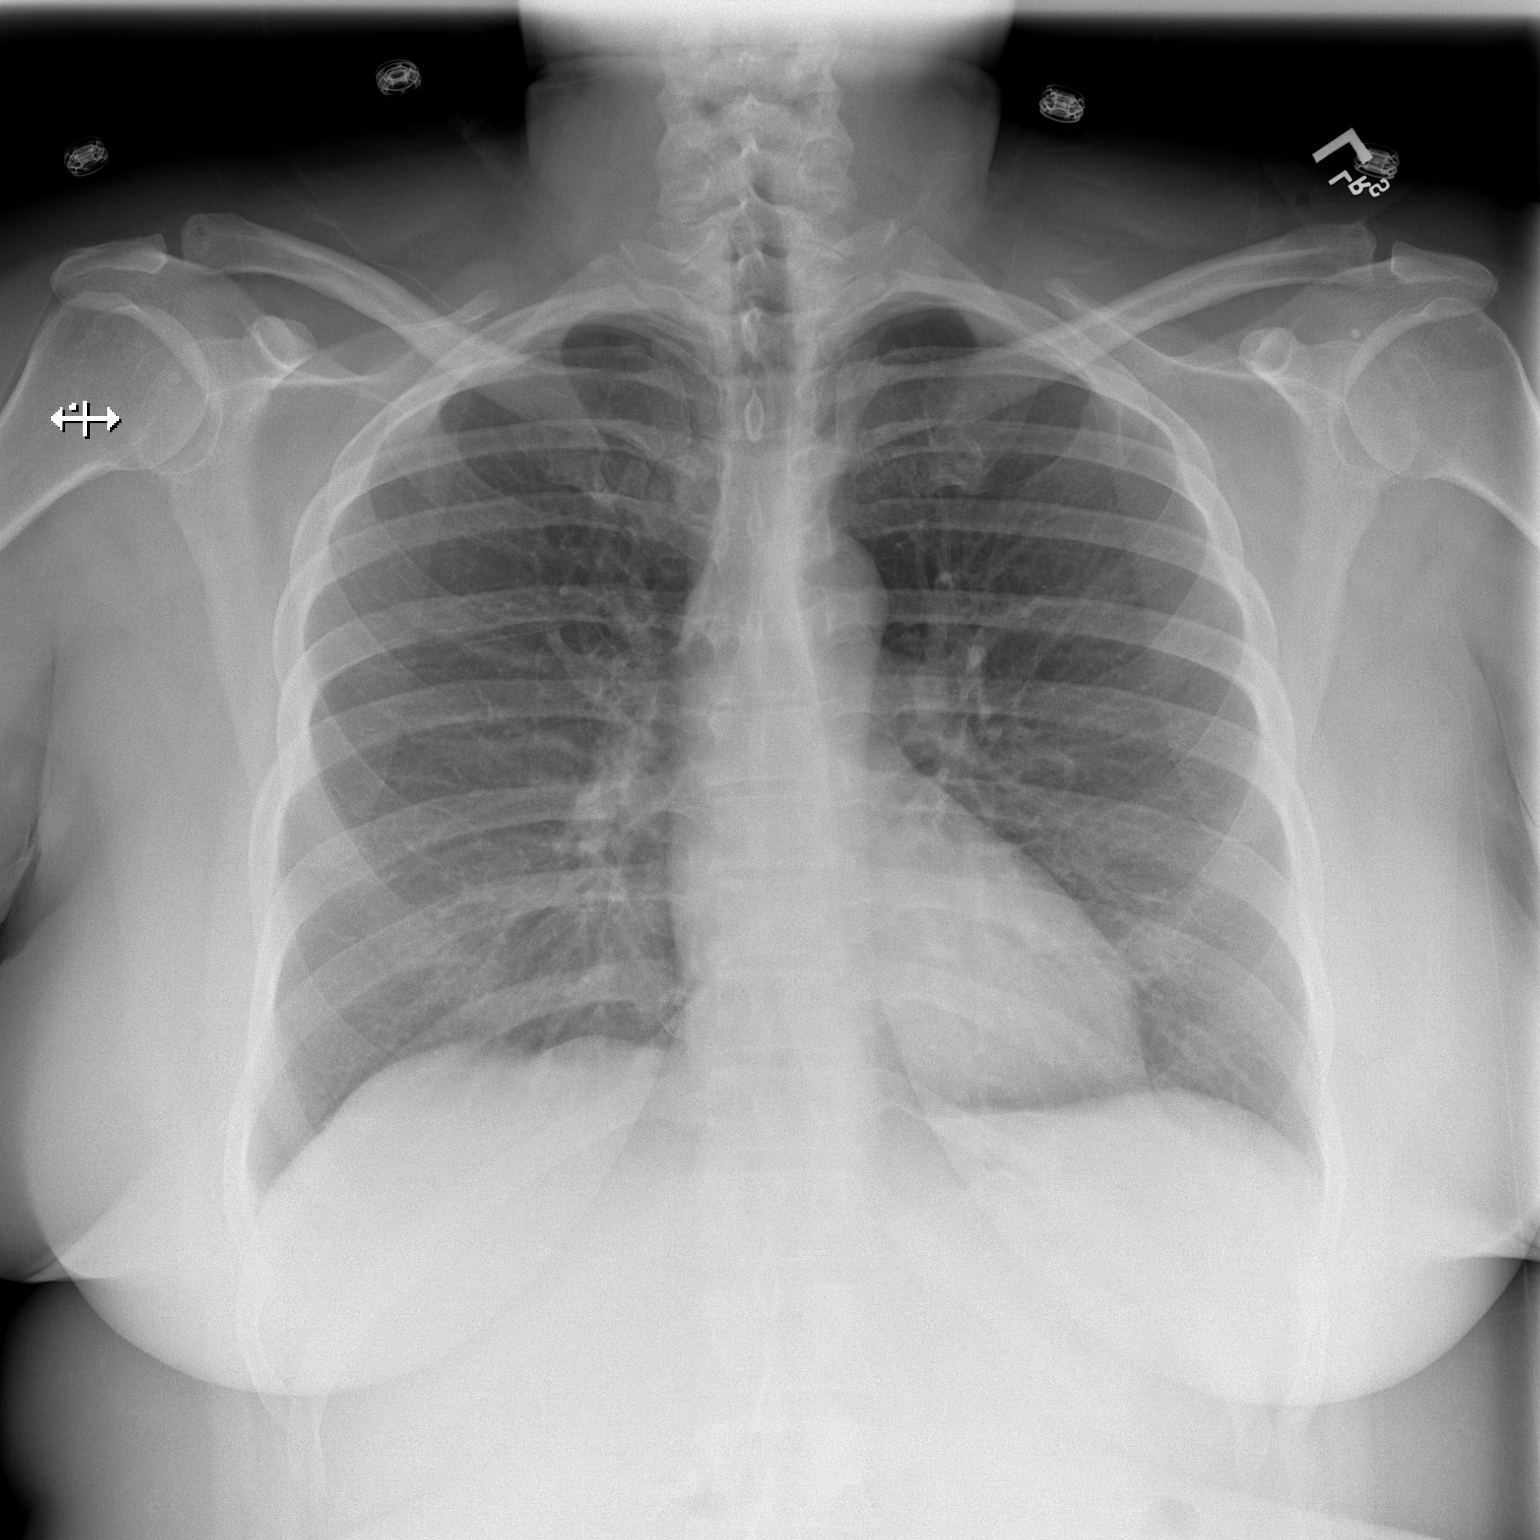

[w chest lat]
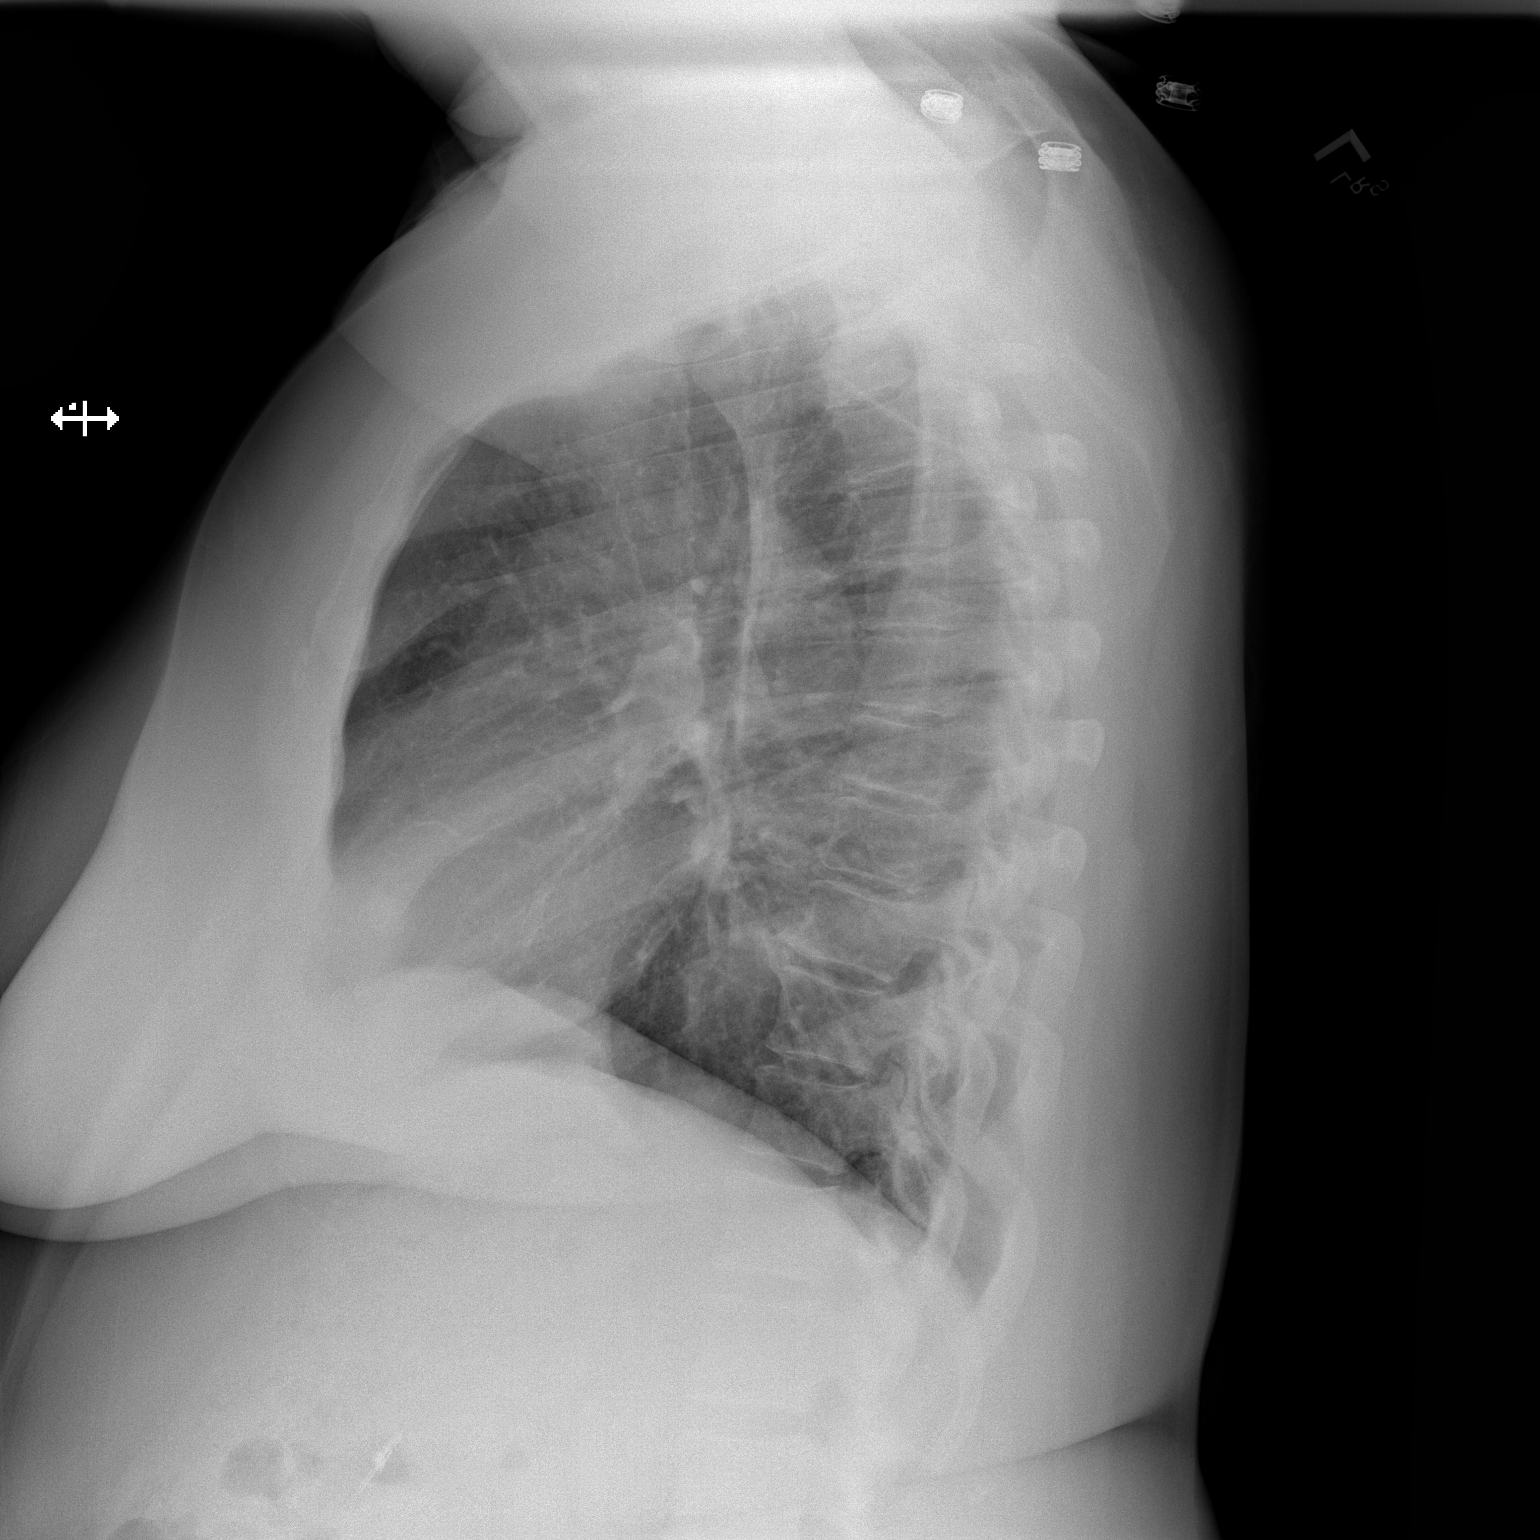

[2 of 2 positions shown; findings below may reference images not displayed]

FINDINGS: Cardiomediastinal silhouette is normal in size and configuration.
Lungs are clear. Lung volumes are normal. No evidence of pneumonia.
No pleural effusion. No pneumothorax.

Osseous and soft tissue structures about the chest are unremarkable.
IMPRESSION: No active cardiopulmonary disease.  No evidence of pneumonia.

## 2018-01-21 DIAGNOSIS — Z0271 Encounter for disability determination: Secondary | ICD-10-CM

## 2018-01-29 ENCOUNTER — Other Ambulatory Visit (INDEPENDENT_AMBULATORY_CARE_PROVIDER_SITE_OTHER): Payer: Self-pay | Admitting: Physician Assistant

## 2018-01-29 DIAGNOSIS — F411 Generalized anxiety disorder: Secondary | ICD-10-CM

## 2018-01-29 NOTE — Telephone Encounter (Signed)
FWD to PCP. [name], CMA  

## 2018-01-30 ENCOUNTER — Encounter (INDEPENDENT_AMBULATORY_CARE_PROVIDER_SITE_OTHER): Payer: Self-pay | Admitting: Physician Assistant

## 2018-01-30 ENCOUNTER — Ambulatory Visit (INDEPENDENT_AMBULATORY_CARE_PROVIDER_SITE_OTHER): Payer: Medicaid Other | Admitting: Physician Assistant

## 2018-01-30 VITALS — BP 178/82 | HR 98 | Temp 97.6°F | Resp 18 | Ht 65.5 in | Wt 237.0 lb

## 2018-01-30 DIAGNOSIS — M25532 Pain in left wrist: Secondary | ICD-10-CM | POA: Diagnosis not present

## 2018-01-30 DIAGNOSIS — D509 Iron deficiency anemia, unspecified: Secondary | ICD-10-CM

## 2018-01-30 MED ORDER — ACETAMINOPHEN 500 MG PO TABS: 1000.0000 mg | ORAL_TABLET | Freq: Three times a day (TID) | ORAL | 0 refills | Status: AC | PRN

## 2018-01-30 NOTE — Patient Instructions (Signed)
  Wrist Pain, Adult There are many things that can cause wrist pain. Some common causes include:  An injury to the wrist area.  Overuse of the joint.  A condition that causes too much pressure to be put on a nerve in the wrist (carpal tunnel syndrome).  Wear and tear of the joints that happens as a person gets older (osteoarthritis).  Other types of arthritis.  Sometimes, the cause of wrist pain is not known. Often, the pain goes away when you follow your doctor's instructions for helping pain at home, such as resting or icing your wrist. If your wrist pain does not go away, it is important to tell your doctor. Follow these instructions at home:  Rest the wrist area for 48 hours or more, or as long as told by your doctor.  If a splint or elastic bandage has been put on your wrist, use it as told by your doctor. ? Take off the splint or bandage only as told by your doctor. ? Loosen the splint or bandage if your fingers tingle, lose feeling (get numb), or turn cold or blue.  If directed, apply ice to the injured area: ? If you have a removable splint or elastic bandage, remove it as told by your doctor. ? Put ice in a plastic bag. ? Place a towel between your skin and the bag or between your splint or bandage and the bag. ? Leave the ice on for 20 minutes, 2-3 times a day.  Keep your arm raised (elevated) above the level of your heart while you are sitting or lying down.  Take over-the-counter and prescription medicines only as told by your doctor.  Keep all follow-up visits as told by your doctor. This is important. Contact a doctor if:  You have a sudden sharp pain in the wrist, hand, or arm that is different or new.  The swelling or bruising on your wrist or hand gets worse.  Your skin becomes red, gets a rash, or has open sores.  Your pain does not get better or it gets worse. Get help right away if:  You lose feeling in your fingers or hand.  Your fingers turn white,  very red, or cold and blue.  You cannot move your fingers.  You have a fever or chills. This information is not intended to replace advice given to you by your health care provider. Make sure you discuss any questions you have with your health care provider. Document Released: 09/13/2007 Document Revised: 10/21/2015 Document Reviewed: 10/14/2015 Elsevier Interactive Patient Education  2018 [employer]  

## 2018-01-30 NOTE — Progress Notes (Signed)
Subjective:  Patient ID: [name], female    DOB: Feb 12, 1971  Age: 47 y.o. MRN: [id number]  CC: f/u   HPI [name] a RHD 47 y.o.femalewith a PMH of anxiety, depression, DM2,OSA,and HTN presents to f/u on abnormal CBC and hematology review for Leukocytosis. WBC review revealed "Normal in number overall with a mild absolute lymphocytosis. A polymorphous population of lymphocytes is identified". Furthermore, pt is thought to possibly have Thalassemia on hematology review. Last CBC revealed MCV 77 fL and MCH of 24.9 pg. Platelets with normal findings. Endorse fatigue. Does not endorse bleeding of any kind.     Patient also complains of left wrist pain since January 19, 2018. Pain felt suddenly while visiting [employer]. Does not endorse fall, injury, heavy lifting, repetitive motion, or weakness. Pain is associated with tingling of the 3rd and 4th digit.     Outpatient Medications Prior to Visit  Medication Sig Dispense Refill  . acetaZOLAMIDE (DIAMOX) 500 MG capsule Take 500 mg by mouth 2 (two) times daily.  1  . aspirin EC 81 MG tablet Take 81 mg by mouth every 6 (six) hours as needed for mild pain.     . beclomethasone (QVAR) 40 MCG/ACT inhaler Inhale 2 puffs into the lungs 2 (two) times daily.    . clonazePAM (KLONOPIN) 1 MG tablet Take 1 tablet (1 mg total) by mouth daily. 30 tablet 0  . docusate sodium (COLACE) 100 MG capsule Take 100 mg by mouth daily as needed.     Marland Kitchen EASY COMFORT PEN NEEDLES 31G X 5 MM MISC USE AS INSTRUCTED. TWICE DAILY. 100 each 1  . escitalopram (LEXAPRO) 20 MG tablet Take 20 mg by mouth daily.  5  . furosemide (LASIX) 20 MG tablet 1 tablet daily for a week and then take 2 tablets daily 60 tablet 3  . gabapentin (NEURONTIN) 300 MG capsule TAKE 1 CAPSULE (300 MG TOTAL) BY MOUTH 3 (THREE) TIMES DAILY. (Patient not taking: Reported on 01/02/2018) 90 capsule 3  . glimepiride (AMARYL) 4 MG tablet Take 1 tablet (4 mg total) by mouth daily before breakfast.  90 tablet 1  . glucose blood (ACCU-CHEK AVIVA PLUS) test strip Use as instructed 100 each 12  . hydrochlorothiazide (HYDRODIURIL) 25 MG tablet Take 1 tablet (25 mg total) by mouth daily. 90 tablet 1  . insulin aspart (NOVOLOG) 100 UNIT/ML injection Inject 10 Units into the skin 3 (three) times daily before meals. 10 mL 11  . Insulin Detemir (LEVEMIR) 100 UNIT/ML Pen Inject 50 Units into the skin 2 (two) times daily. 15 mL 11  . Lancets (ACCU-CHEK SOFT TOUCH) lancets Use as instructed 100 each 12  . megestrol (MEGACE) 40 MG tablet TAKE 1 TABLET (40 MG TOTAL) BY MOUTH 2 (TWO) TIMES DAILY. 60 tablet 1  . metFORMIN (GLUCOPHAGE) 1000 MG tablet Take 1 tablet (1,000 mg total) by mouth 2 (two) times daily with a meal. 180 tablet 3  . nicotine (NICODERM CQ - DOSED IN MG/24 HOURS) 21 mg/24hr patch Place 1 patch (21 mg total) onto the skin daily. 28 patch 1  . nortriptyline (PAMELOR) 25 MG capsule Take 2 capsules (50 mg total) by mouth at bedtime. 60 capsule 3  . potassium chloride (K-DUR) 10 MEQ tablet Take 2 tablets (20 mEq total) by mouth daily. 60 tablet 3  . pravastatin (PRAVACHOL) 10 MG tablet TAKE 1 TABLET (10 MG TOTAL) BY MOUTH DAILY. 90 tablet 1  . propranolol (INDERAL) 20 MG tablet 1 tablet twice daily for  2 weeks and then go to 2 tablets twice daily 120 tablet 3  . PROVENTIL HFA 108 (90 Base) MCG/ACT inhaler INHALE 1 TO 2 PUFFS BY MOUTH EVERY 6 (SIX) HOURS AS NEEDED FOR WHEEZING OR SHORTNESS OF BREATH. 6.7 g 5  . topiramate (TOPAMAX) 25 MG tablet Take 25 mg by mouth.  3  . traMADol (ULTRAM) 50 MG tablet TAKE ONE TABLET BY MOUTH TWICE DAILY AS NEEDED FOR PAIN (Patient not taking: Reported on 01/09/2018) 50 tablet 0   No facility-administered medications prior to visit.      ROS Review of Systems  Constitutional: Negative for chills, fever and malaise/fatigue.  Eyes: Negative for blurred vision.  Respiratory: Negative for shortness of breath.   Cardiovascular: Negative for chest pain and  palpitations.  Gastrointestinal: Negative for abdominal pain and nausea.  Genitourinary: Negative for dysuria and hematuria.  Musculoskeletal: Positive for joint pain. Negative for myalgias.  Skin: Negative for rash.  Neurological: Negative for tingling and headaches.  Psychiatric/Behavioral: Negative for depression. The patient is not nervous/anxious.     Objective:  Ht 5' 5.5" (1.664 m)   Wt 237 lb (107.5 kg)   BMI 38.84 kg/m   BP/Weight 01/30/2018 01/09/2018 01/02/2018  Systolic BP - 116 131  Diastolic BP - 79 81  Wt. (Lbs) 237 256.8 258.4  BMI 38.84 42.08 42.35      Physical Exam  Constitutional: She is oriented to person, place, and time.  Well developed, well nourished, NAD, polite  HENT:  Head: Normocephalic and atraumatic.  Eyes: No scleral icterus.  Neck: Normal range of motion. Neck supple. No thyromegaly present.  Cardiovascular: Normal rate, regular rhythm and normal heart sounds.  Pulmonary/Chest: Effort normal and breath sounds normal.  Musculoskeletal: She exhibits no edema.  Left wrist without deformity, erythema, edema, or limited aROM.   Neurological: She is alert and oriented to person, place, and time.  Negative Tinel's and Phalen's test  Skin: Skin is warm and dry. No rash noted. No erythema. No pallor.  Psychiatric: She has a normal mood and affect. Her behavior is normal. Thought content normal.  Vitals reviewed.    Assessment & Plan:    1. Microcytic anemia - Iron, TIBC and Ferritin Panel - Hemoglobinopathy evaluation - CBC with Differential  2. Left wrist pain - ANA w/Reflex. Pt complains of generalized myalgias and arthralgias.  - Wrist splint - Begin acetaminophen (TYLENOL) 500 MG tablet; Take 2 tablets (1,000 mg total) by mouth every 8 (eight) hours as needed for up to 7 days.  Dispense: 21 tablet; Refill: 0 - Neurologist recommends pt abstain from NSAIDs.   Meds ordered this encounter  Medications  . acetaminophen (TYLENOL) 500 MG  tablet    Sig: Take 2 tablets (1,000 mg total) by mouth every 8 (eight) hours as needed for up to 7 days.    Dispense:  21 tablet    Refill:  0    Order Specific Question:   Supervising Provider    Answer:   Hoy Register [[id number]]    Follow-up: Return if symptoms worsen or fail to improve.   Loletta Specter PA

## 2018-02-04 LAB — CBC WITH DIFFERENTIAL/PLATELET
BASOS: 0 %
Basophils Absolute: 0 10*3/uL (ref 0.0–0.2)
EOS (ABSOLUTE): 0.1 10*3/uL (ref 0.0–0.4)
Eos: 1 %
HEMOGLOBIN: 13.6 g/dL (ref 11.1–15.9)
Hematocrit: 42.9 % (ref 34.0–46.6)
IMMATURE GRANS (ABS): 0 10*3/uL (ref 0.0–0.1)
IMMATURE GRANULOCYTES: 0 %
LYMPHS: 34 %
Lymphocytes Absolute: 4.3 10*3/uL — ABNORMAL HIGH (ref 0.7–3.1)
MCH: 24.1 pg — ABNORMAL LOW (ref 26.6–33.0)
MCHC: 31.7 g/dL (ref 31.5–35.7)
MCV: 76 fL — AB (ref 79–97)
MONOCYTES: 5 %
Monocytes Absolute: 0.6 10*3/uL (ref 0.1–0.9)
NEUTROS PCT: 60 %
Neutrophils Absolute: 7.6 10*3/uL — ABNORMAL HIGH (ref 1.4–7.0)
PLATELETS: 325 10*3/uL (ref 150–450)
RBC: 5.64 x10E6/uL — ABNORMAL HIGH (ref 3.77–5.28)
RDW: 15.4 % (ref 12.3–15.4)
WBC: 12.6 10*3/uL — ABNORMAL HIGH (ref 3.4–10.8)

## 2018-02-04 LAB — IRON,TIBC AND FERRITIN PANEL
FERRITIN: 82 ng/mL (ref 15–150)
IRON SATURATION: 11 % — AB (ref 15–55)
IRON: 34 ug/dL (ref 27–159)
Total Iron Binding Capacity: 304 ug/dL (ref 250–450)
UIBC: 270 ug/dL (ref 131–425)

## 2018-02-04 LAB — HEMOGLOBINOPATHY EVALUATION
HEMOGLOBIN F QUANTITATION: 0 % (ref 0.0–2.0)
HGB A: 65.3 % — AB (ref 96.4–98.8)
HGB C: 31.4 % — ABNORMAL HIGH
HGB S: 0 %
HGB VARIANT: 0 %
Hemoglobin A2 Quantitation: 3.3 % — ABNORMAL HIGH (ref 1.8–3.2)

## 2018-02-04 LAB — SPECIMEN STATUS REPORT

## 2018-02-04 LAB — ANA W/REFLEX: ANA: NEGATIVE

## 2018-02-06 ENCOUNTER — Telehealth (INDEPENDENT_AMBULATORY_CARE_PROVIDER_SITE_OTHER): Payer: Self-pay

## 2018-02-06 ENCOUNTER — Ambulatory Visit (INDEPENDENT_AMBULATORY_CARE_PROVIDER_SITE_OTHER): Payer: Medicaid Other | Admitting: Orthopedic Surgery

## 2018-02-06 DIAGNOSIS — M1711 Unilateral primary osteoarthritis, right knee: Secondary | ICD-10-CM

## 2018-02-06 MED ORDER — TRAMADOL HCL 50 MG PO TABS: ORAL_TABLET | ORAL | 0 refills | Status: DC

## 2018-02-06 NOTE — Telephone Encounter (Signed)
-----   Message from Loletta Specter, PA-C sent at 02/05/2018  8:25 AM EDT ----- Negative for autoimmune disease. Positive for hemoglobin C trait. Having the trait does not affect pt but can affect her children if she partners with a female that has hemoglobin C trait also. Iron on the low side. Should take iron pills OTC.

## 2018-02-06 NOTE — Telephone Encounter (Signed)
Patient is aware that she is negative for autoimmune disease; positive for hemoglobin C trait. Having the trait does not affect you but can affect your children if you partner with a female that has the hemoglobin C trait also. Iron is on the low side, should take OTC iron pills. Maryjean Morn, CMA

## 2018-02-08 ENCOUNTER — Encounter (INDEPENDENT_AMBULATORY_CARE_PROVIDER_SITE_OTHER): Payer: Self-pay | Admitting: Orthopedic Surgery

## 2018-02-08 NOTE — Progress Notes (Signed)
Office Visit Note   Patient: [name]           Date of Birth: [birth year]           MRN: [id number] Visit Date: 02/06/2018 Requested by: Loletta Specter, PA-C [address] [place], [place] [address] PCP: Denny Levy  Subjective: Chief Complaint  Patient presents with  . Right Knee - Pain    HPI: Pam is a patient with known right knee arthritis.  Last injection was in March.  She does well with that.  She reports continuing pain and difficulty with weightbearing.  She would like to get the knee replaced sometime in the future.  Denies any interval history of injury              ROS: All systems reviewed are negative as they relate to the chief complaint within the history of present illness.  Patient denies  fevers or chills.   Assessment & Plan: Visit Diagnoses:  1. Unilateral primary osteoarthritis, right knee     Plan: Impression is severe valgus alignment and arthritis in the right knee.  Injection performed today.  Tramadol refilled.  I will see her back as needed.  Weight loss and non-loadbearing quad strengthening exercises encouraged  Follow-Up Instructions: Return if symptoms worsen or fail to improve.   Orders:  No orders of the defined types were placed in this encounter.  Meds ordered this encounter  Medications  . traMADol (ULTRAM) 50 MG tablet    Sig: 1 po q hs prn pain    Dispense:  30 tablet    Refill:  0      Procedures: No procedures performed   Clinical Data: No additional findings.  Objective: Vital Signs: There were no vitals taken for this visit.  Physical Exam:   Constitutional: Patient appears well-developed HEENT:  Head: Normocephalic Eyes:EOM are normal Neck: Normal range of motion Cardiovascular: Normal rate Pulmonary/chest: Effort normal Neurologic: Patient is alert Skin: Skin is warm Psychiatric: Patient has normal mood and affect    Ortho Exam: Ortho exam demonstrates full active and passive  range of motion of the left knee.  Right knee has valgus alignment and trace effusion and difficulty with painless range of motion.  Patellofemoral crepitus is present but the patella is stable.  Pedal pulses palpable.  No groin pain with internal/external rotation of the leg.  Specialty Comments:  No specialty comments available.  Imaging: No results found.   PMFS History: Patient Active Problem List   Diagnosis Date Noted  . BIH (benign intracranial hypertension) 11/06/2017  . Somnolence, daytime 07/30/2017  . Snoring 07/30/2017  . Morbid obesity (HCC) 07/30/2017  . Migraine 10/02/2016  . Common migraine with intractable migraine 10/02/2016  . Type 2 diabetes mellitus without complication, without long-term current use of insulin (HCC) 08/17/2016   Past Medical History:  Diagnosis Date  . Anxiety   . Common migraine with intractable migraine 10/02/2016  . Depression   . Diabetes mellitus   . Hypertension     Family History  Problem Relation Age of Onset  . Diabetes Mother   . Hypertension Mother   . Renal Disease Mother   . Cancer Father     Past Surgical History:  Procedure Laterality Date  . BTL     Social History   Occupational History  . Not on file  Tobacco Use  . Smoking status: Current Every Day Smoker    Packs/day: 0.24    Types: Cigarettes  .  Smokeless tobacco: Never Used  Substance and Sexual Activity  . Alcohol use: No  . Drug use: No  . Sexual activity: Yes    Birth control/protection: None

## 2018-02-25 ENCOUNTER — Encounter

## 2018-02-26 ENCOUNTER — Other Ambulatory Visit: Payer: Self-pay | Admitting: Neurology

## 2018-02-26 ENCOUNTER — Other Ambulatory Visit (INDEPENDENT_AMBULATORY_CARE_PROVIDER_SITE_OTHER): Payer: Self-pay | Admitting: Physician Assistant

## 2018-02-26 ENCOUNTER — Other Ambulatory Visit (INDEPENDENT_AMBULATORY_CARE_PROVIDER_SITE_OTHER): Payer: Self-pay | Admitting: Orthopedic Surgery

## 2018-02-26 ENCOUNTER — Other Ambulatory Visit: Payer: Self-pay | Admitting: Obstetrics & Gynecology

## 2018-02-26 DIAGNOSIS — N939 Abnormal uterine and vaginal bleeding, unspecified: Secondary | ICD-10-CM

## 2018-02-26 DIAGNOSIS — F411 Generalized anxiety disorder: Secondary | ICD-10-CM

## 2018-02-26 NOTE — Telephone Encounter (Signed)
Ok to rf? 

## 2018-02-26 NOTE — Telephone Encounter (Signed)
FWD to PCP. Tempestt S Roberts, CMA  

## 2018-02-27 ENCOUNTER — Ambulatory Visit (INDEPENDENT_AMBULATORY_CARE_PROVIDER_SITE_OTHER): Payer: Medicaid Other | Admitting: Physician Assistant

## 2018-02-27 NOTE — Telephone Encounter (Signed)
thx

## 2018-02-27 NOTE — Telephone Encounter (Signed)
Called to pharmacy 

## 2018-02-27 NOTE — Telephone Encounter (Signed)
y

## 2018-03-06 ENCOUNTER — Telehealth (INDEPENDENT_AMBULATORY_CARE_PROVIDER_SITE_OTHER): Payer: Self-pay | Admitting: Orthopedic Surgery

## 2018-03-06 NOTE — Telephone Encounter (Signed)
Patient called requesting refill on Tramadol. ° °Please call patient to advise °[phone] °

## 2018-03-06 NOTE — Telephone Encounter (Signed)
Medication was refilled in the system on 11/21.  I called pharmacy and they state it was not filled. Patient advised it will be there ready for pick up.

## 2018-03-11 ENCOUNTER — Telehealth: Payer: Self-pay | Admitting: Neurology

## 2018-03-11 ENCOUNTER — Ambulatory Visit: Payer: Medicaid Other | Admitting: Neurology

## 2018-03-11 NOTE — Telephone Encounter (Signed)
This patient did not show for a revisit appointment today. 

## 2018-03-12 ENCOUNTER — Ambulatory Visit: Payer: Medicaid Other | Admitting: Adult Health

## 2018-03-12 ENCOUNTER — Encounter: Payer: Self-pay | Admitting: Neurology

## 2018-03-12 ENCOUNTER — Encounter: Payer: Self-pay | Admitting: Adult Health

## 2018-03-12 VITALS — BP 133/81 | HR 90 | Ht 65.5 in | Wt 253.4 lb

## 2018-03-12 DIAGNOSIS — G43009 Migraine without aura, not intractable, without status migrainosus: Secondary | ICD-10-CM | POA: Diagnosis not present

## 2018-03-12 DIAGNOSIS — G932 Benign intracranial hypertension: Secondary | ICD-10-CM | POA: Diagnosis not present

## 2018-03-12 NOTE — Progress Notes (Signed)
PATIENT: [name] DOB: 02/18/1971  REASON FOR VISIT: follow up HISTORY FROM: patient  HISTORY OF PRESENT ILLNESS: Today 03/12/18:  [name] is a 47 year old female with a history of migraine headaches.  She returns today for follow-up.  She states that her headaches have improved since she started Lasix.  She states that she may have a headache every 2 to 3 weeks.  Her headache  can last 2 to 3 days.  She has noticed that if she takes tramadol and Tylenol together the headache will resolve.  She was prescribed tramadol by Dr. August Saucer for her knee.  She states that her headaches typically occur in the temporal region and then will radiate like a band around her head.  She does have photophobia and phonophobia.  Denies nausea and vomiting.  Reports with some headaches she will have blurry vision.  She reports that she had an appointment with Dr. Dione Booze about 4 months ago.  She reports that the exam was "good."  She does report that she is now wearing glasses as well.  I have not seen Dr. Laruth Bouchard report.  The patient reports that she is still taking Diamox.  She was unaware that she was supposed to wean off of this medication.  She returns today for evaluation.    REVIEW OF SYSTEMS: Out of a complete 14 system review of symptoms, the patient complains only of the following symptoms, and all other reviewed systems are negative.  See HPI  ALLERGIES: Allergies  Allergen Reactions  . Lisinopril     angioedema    HOME MEDICATIONS: Outpatient Medications Prior to Visit  Medication Sig Dispense Refill  . acetaZOLAMIDE (DIAMOX) 500 MG capsule Take 500 mg by mouth 2 (two) times daily.  1  . aspirin EC 81 MG tablet Take 81 mg by mouth every 6 (six) hours as needed for mild pain.     . beclomethasone (QVAR) 40 MCG/ACT inhaler Inhale 2 puffs into the lungs 2 (two) times daily.    . clonazePAM (KLONOPIN) 1 MG tablet Take 1 tablet (1 mg total) by mouth daily. 30 tablet 0  . docusate sodium  (COLACE) 100 MG capsule Take 100 mg by mouth daily as needed.     Marland Kitchen EASY COMFORT PEN NEEDLES 31G X 5 MM MISC USE AS INSTRUCTED. TWICE DAILY. 100 each 1  . escitalopram (LEXAPRO) 20 MG tablet Take 20 mg by mouth daily.  5  . furosemide (LASIX) 20 MG tablet 1 tablet daily for a week and then take 2 tablets daily 60 tablet 3  . gabapentin (NEURONTIN) 300 MG capsule TAKE 1 CAPSULE (300 MG TOTAL) BY MOUTH 3 (THREE) TIMES DAILY. 90 capsule 3  . glimepiride (AMARYL) 4 MG tablet Take 1 tablet (4 mg total) by mouth daily before breakfast. 90 tablet 1  . glucose blood (ACCU-CHEK AVIVA PLUS) test strip Use as instructed 100 each 12  . hydrochlorothiazide (HYDRODIURIL) 25 MG tablet Take 1 tablet (25 mg total) by mouth daily. 90 tablet 1  . insulin aspart (NOVOLOG) 100 UNIT/ML injection Inject 10 Units into the skin 3 (three) times daily before meals. 10 mL 11  . Insulin Detemir (LEVEMIR) 100 UNIT/ML Pen Inject 50 Units into the skin 2 (two) times daily. 15 mL 11  . Lancets (ACCU-CHEK SOFT TOUCH) lancets Use as instructed 100 each 12  . megestrol (MEGACE) 40 MG tablet TAKE 1 TABLET (40 MG TOTAL) BY MOUTH 2 (TWO) TIMES DAILY. 60 tablet 0  . metFORMIN (GLUCOPHAGE)  1000 MG tablet Take 1 tablet (1,000 mg total) by mouth 2 (two) times daily with a meal. 180 tablet 3  . nicotine (NICODERM CQ - DOSED IN MG/24 HOURS) 21 mg/24hr patch PLACE 1 PATCH (21 MG TOTAL) ONTO THE SKIN DAILY. 28 patch 0  . nortriptyline (PAMELOR) 25 MG capsule Take 2 capsules (50 mg total) by mouth at bedtime. 60 capsule 3  . potassium chloride (K-DUR) 10 MEQ tablet Take 2 tablets (20 mEq total) by mouth daily. 60 tablet 3  . pravastatin (PRAVACHOL) 10 MG tablet TAKE 1 TABLET (10 MG TOTAL) BY MOUTH DAILY. 90 tablet 1  . propranolol (INDERAL) 20 MG tablet 1 tablet twice daily for 2 weeks and then go to 2 tablets twice daily 120 tablet 3  . PROVENTIL HFA 108 (90 Base) MCG/ACT inhaler INHALE 1 TO 2 PUFFS BY MOUTH EVERY 6 (SIX) HOURS AS NEEDED FOR  WHEEZING OR SHORTNESS OF BREATH. 6.7 g 5  . topiramate (TOPAMAX) 25 MG tablet Take 25 mg by mouth.  3  . traMADol (ULTRAM) 50 MG tablet TAKE ONE TABLET BY MOUTH TWICE DAILY AS NEEDED FOR PAIN 50 tablet 0  . traMADol (ULTRAM) 50 MG tablet 1 po q hs prn pain 30 tablet 0  . traMADol (ULTRAM) 50 MG tablet TAKE ONE TABLET BY MOUTH DAILY AT BEDTIME. AS NEEDED FOR PAIN 30 tablet 0   No facility-administered medications prior to visit.     PAST MEDICAL HISTORY: Past Medical History:  Diagnosis Date  . Anxiety   . Common migraine with intractable migraine 10/02/2016  . Depression   . Diabetes mellitus   . Hypertension     PAST SURGICAL HISTORY: Past Surgical History:  Procedure Laterality Date  . BTL      FAMILY HISTORY: Family History  Problem Relation Age of Onset  . Diabetes Mother   . Hypertension Mother   . Renal Disease Mother   . Cancer Father     SOCIAL HISTORY: Social History   Socioeconomic History  . Marital status: Single    Spouse name: Not on file  . Number of children: Not on file  . Years of education: Not on file  . Highest education level: Not on file  Occupational History  . Not on file  Social Needs  . Financial resource strain: Not on file  . Food insecurity:    Worry: Not on file    Inability: Not on file  . Transportation needs:    Medical: Not on file    Non-medical: Not on file  Tobacco Use  . Smoking status: Current Every Day Smoker    Packs/day: 0.24    Types: Cigarettes  . Smokeless tobacco: Never Used  Substance and Sexual Activity  . Alcohol use: No  . Drug use: No  . Sexual activity: Yes    Birth control/protection: None  Lifestyle  . Physical activity:    Days per week: Not on file    Minutes per session: Not on file  . Stress: Not on file  Relationships  . Social connections:    Talks on phone: Not on file    Gets together: Not on file    Attends religious service: Not on file    Active member of club or organization: Not  on file    Attends meetings of clubs or organizations: Not on file    Relationship status: Not on file  . Intimate partner violence:    Fear of current or ex partner: Not  on file    Emotionally abused: Not on file    Physically abused: Not on file    Forced sexual activity: Not on file  Other Topics Concern  . Not on file  Social History Narrative   Right handed    Lives with daughter   Caffeine use: Drinks coffee/tea/soda sometimes      PHYSICAL EXAM  Vitals:   03/12/18 1124  BP: 133/81  Pulse: 90  Weight: 253 lb 6.4 oz (114.9 kg)  Height: 5' 5.5" (1.664 m)   Body mass index is 41.53 kg/m.  Generalized: Well developed, in no acute distress   Neurological examination  Mentation: Alert oriented to time, place, history taking. Follows all commands speech and language fluent Cranial nerve II-XII: Pupils were equal round reactive to light. Extraocular movements were full, visual field were full on confrontational test. Facial sensation and strength were normal. Uvula tongue midline. Head turning and shoulder shrug  were normal and symmetric. Motor: The motor testing reveals 5 over 5 strength of all 4 extremities. Good symmetric motor tone is noted throughout.  Sensory: Sensory testing is intact to soft touch on all 4 extremities. No evidence of extinction is noted.  Coordination: Cerebellar testing reveals good finger-nose-finger and heel-to-shin bilaterally.  Gait and station: Gait is normal.  Reflexes: Deep tendon reflexes are symmetric and normal bilaterally.   DIAGNOSTIC DATA (LABS, IMAGING, TESTING) - I reviewed patient records, labs, notes, testing and imaging myself where available.  Lab Results  Component Value Date   WBC 12.6 (H) 01/30/2018   HGB 13.6 01/30/2018   HCT 42.9 01/30/2018   MCV 76 (L) 01/30/2018   PLT 325 01/30/2018      Component Value Date/Time   NA 143 12/11/2017 1525   K 4.4 12/11/2017 1525   CL 107 (H) 12/11/2017 1525   CO2 21 12/11/2017  1525   GLUCOSE 178 (H) 12/11/2017 1525   GLUCOSE 353 (H) 05/29/2016 1812   BUN 12 12/11/2017 1525   CREATININE 0.79 12/11/2017 1525   CALCIUM 10.2 12/11/2017 1525   PROT 7.5 12/11/2017 1525   ALBUMIN 4.4 12/11/2017 1525   AST 28 12/11/2017 1525   ALT 70 (H) 12/11/2017 1525   ALKPHOS 154 (H) 12/11/2017 1525   BILITOT <0.2 12/11/2017 1525   GFRNONAA 88 12/11/2017 1525   GFRAA 102 12/11/2017 1525   Lab Results  Component Value Date   CHOL 159 08/17/2016   HDL 48 08/17/2016   LDLCALC 98 08/17/2016   TRIG 66 08/17/2016   CHOLHDL 3.3 08/17/2016   Lab Results  Component Value Date   HGBA1C 7.0 (A) 01/02/2018   No results found for: VITAMINB12 Lab Results  Component Value Date   TSH 4.840 (H) 01/09/2018      ASSESSMENT AND PLAN 47 y.o. year old female  has a past medical history of Anxiety, Common migraine with intractable migraine (10/02/2016), Depression, Diabetes mellitus, and Hypertension. here with:  1.  Migraine headaches 2.  Benign intracranial hypertension  The patient will continue on Lasix, potassium supplement and nortriptyline.  She was unaware that she needed to also wean off the Diamox when she started Lasix.  I reviewed weaning instructions with her for the Diamox.  I advised that if her headaches worsen or she develops new symptoms she should let us know.  She will follow-up in 6 months or sooner if needed.     Butch PennyMegan Shanekqua Schaper, MSN, NP-C 03/12/2018, 11:38 AM [facility] [address], [address] [place], [place]  [address] [phone]   

## 2018-03-12 NOTE — Patient Instructions (Signed)
Your Plan:  Continue Lasix, potassium supplement, nortriptyline If your symptoms worsen or you develop new symptoms please let us know.    [facility]. I hope we have been able to provide you high quality care today.  You may receive a patient satisfaction survey over the next few weeks. We would appreciate your feedback and comments so that we may continue to improve ourselves and the health of our patients.

## 2018-03-13 ENCOUNTER — Telehealth: Payer: Self-pay | Admitting: Adult Health

## 2018-03-13 NOTE — Telephone Encounter (Signed)
fyi- patient has no-showed 3 times in 2019.

## 2018-03-13 NOTE — Telephone Encounter (Signed)
Yesterday she came in later for an appointment. Will not dismiss at this time.

## 2018-03-13 NOTE — Telephone Encounter (Signed)
Noted  

## 2018-03-18 ENCOUNTER — Ambulatory Visit (INDEPENDENT_AMBULATORY_CARE_PROVIDER_SITE_OTHER): Payer: Medicaid Other

## 2018-03-18 ENCOUNTER — Telehealth (INDEPENDENT_AMBULATORY_CARE_PROVIDER_SITE_OTHER): Payer: Self-pay | Admitting: Orthopedic Surgery

## 2018-03-18 NOTE — Telephone Encounter (Signed)
Patient called needing refill on Rx (Tramadol)  Patient asked if she can take 2 tabs a day because her right knee is hurting pretty bad. The number to contact patient is [phone]

## 2018-03-18 NOTE — Telephone Encounter (Signed)
Ok to rf I would stick with one q 8 pls cla lthx

## 2018-03-18 NOTE — Telephone Encounter (Signed)
Please advise 

## 2018-03-19 MED ORDER — TRAMADOL HCL 50 MG PO TABS: 50.0000 mg | ORAL_TABLET | Freq: Three times a day (TID) | ORAL | 0 refills | Status: DC | PRN

## 2018-03-19 NOTE — Telephone Encounter (Signed)
IC patient and advised refilled, called in, ok to take up to 1 po q 8 hrs, but ONLY if needed.

## 2018-03-25 ENCOUNTER — Ambulatory Visit (INDEPENDENT_AMBULATORY_CARE_PROVIDER_SITE_OTHER): Payer: Medicaid Other

## 2018-03-26 ENCOUNTER — Other Ambulatory Visit: Payer: Self-pay | Admitting: Neurology

## 2018-04-22 ENCOUNTER — Other Ambulatory Visit: Payer: Self-pay | Admitting: Neurology

## 2018-04-26 ENCOUNTER — Other Ambulatory Visit (INDEPENDENT_AMBULATORY_CARE_PROVIDER_SITE_OTHER): Payer: Self-pay | Admitting: Orthopedic Surgery

## 2018-04-26 NOTE — Telephone Encounter (Signed)
Do you want to refill for this pt?  

## 2018-04-26 NOTE — Telephone Encounter (Signed)
Ok to rf? 

## 2018-04-30 ENCOUNTER — Ambulatory Visit (INDEPENDENT_AMBULATORY_CARE_PROVIDER_SITE_OTHER): Payer: Medicaid Other

## 2018-04-30 DIAGNOSIS — Z111 Encounter for screening for respiratory tuberculosis: Secondary | ICD-10-CM

## 2018-05-02 ENCOUNTER — Ambulatory Visit (INDEPENDENT_AMBULATORY_CARE_PROVIDER_SITE_OTHER): Payer: Medicaid Other

## 2018-05-02 DIAGNOSIS — Z111 Encounter for screening for respiratory tuberculosis: Secondary | ICD-10-CM

## 2018-05-02 LAB — TB SKIN TEST
Induration: 0 mm
TB Skin Test: NEGATIVE

## 2018-05-06 ENCOUNTER — Telehealth (INDEPENDENT_AMBULATORY_CARE_PROVIDER_SITE_OTHER): Payer: Self-pay | Admitting: Orthopedic Surgery

## 2018-05-06 NOTE — Telephone Encounter (Signed)
Ok for refill? 

## 2018-05-06 NOTE — Telephone Encounter (Signed)
y

## 2018-05-06 NOTE — Telephone Encounter (Signed)
Patient called requesting refill on Tramadol.  Please call patient to advise [phone]

## 2018-05-07 MED ORDER — TRAMADOL HCL 50 MG PO TABS: 50.0000 mg | ORAL_TABLET | Freq: Two times a day (BID) | ORAL | 0 refills | Status: DC | PRN

## 2018-05-07 NOTE — Telephone Encounter (Signed)
I left voicemail for pharmacy. I called patient and advised. 

## 2018-05-15 ENCOUNTER — Ambulatory Visit (INDEPENDENT_AMBULATORY_CARE_PROVIDER_SITE_OTHER): Payer: Medicaid Other | Admitting: Orthopedic Surgery

## 2018-05-15 DIAGNOSIS — M1711 Unilateral primary osteoarthritis, right knee: Secondary | ICD-10-CM

## 2018-05-15 MED ORDER — TRAMADOL HCL 50 MG PO TABS: 50.0000 mg | ORAL_TABLET | Freq: Two times a day (BID) | ORAL | 0 refills | Status: DC | PRN

## 2018-05-16 ENCOUNTER — Encounter (INDEPENDENT_AMBULATORY_CARE_PROVIDER_SITE_OTHER): Payer: Self-pay | Admitting: Orthopedic Surgery

## 2018-05-16 DIAGNOSIS — M1711 Unilateral primary osteoarthritis, right knee: Secondary | ICD-10-CM | POA: Diagnosis not present

## 2018-05-16 MED ORDER — BUPIVACAINE HCL 0.25 % IJ SOLN
4.0000 mL | INTRAMUSCULAR | Status: AC | PRN
  Administered 2018-05-16: 4 mL via INTRA_ARTICULAR

## 2018-05-16 MED ORDER — METHYLPREDNISOLONE ACETATE 40 MG/ML IJ SUSP
40.0000 mg | INTRAMUSCULAR | Status: AC | PRN
  Administered 2018-05-16: 40 mg via INTRA_ARTICULAR

## 2018-05-16 MED ORDER — LIDOCAINE HCL 1 % IJ SOLN
5.0000 mL | INTRAMUSCULAR | Status: AC | PRN
  Administered 2018-05-16: 5 mL

## 2018-05-16 NOTE — Progress Notes (Signed)
Office Visit Note   Patient: [name]           Date of Birth: 12-26-1970           MRN: [id number] Visit Date: 05/15/2018 Requested by: Loletta SpecterGomez, Roger David, PA-C No address on file PCP: Loletta SpecterGomez, Roger David, PA-C  Subjective: Chief Complaint  Patient presents with  . Right Knee - Pain    HPI: [name] is a patient with right knee pain.  She had right knee injection 02/06/2018.  That helped.  She has been taking tramadol.  She has a known history of right knee arthritis.  She wears a brace which helps her marginally.  Denies any intervening injury.              ROS: All systems reviewed are negative as they relate to the chief complaint within the history of present illness.  Patient denies  fevers or chills.   Assessment & Plan: Visit Diagnoses:  1. Unilateral primary osteoarthritis, right knee     Plan: Impression is right knee arthritis which is severe.  Plan is injection and refill tramadol.  She is young for total knee replacement but she is likely heading for that at sometime in the future.  I will see her back as needed.  Weight loss encouraged to diminish force on the knee  Follow-Up Instructions: Return if symptoms worsen or fail to improve.   Orders:  No orders of the defined types were placed in this encounter.  Meds ordered this encounter  Medications  . traMADol (ULTRAM) 50 MG tablet    Sig: Take 1 tablet (50 mg total) by mouth 2 (two) times daily as needed.    Dispense:  40 tablet    Refill:  0      Procedures: Large Joint Inj: R knee on 05/16/2018 7:07 PM Indications: diagnostic evaluation, joint swelling and pain Details: 18 G 1.5 in needle, superolateral approach  Arthrogram: No  Medications: 5 mL lidocaine 1 %; 40 mg methylPREDNISolone acetate 40 MG/ML; 4 mL bupivacaine 0.25 % Outcome: tolerated well, no immediate complications Procedure, treatment alternatives, risks and benefits explained, specific risks discussed. Consent was given by the  patient. Immediately prior to procedure a time out was called to verify the correct patient, procedure, equipment, support staff and site/side marked as required. Patient was prepped and draped in the usual sterile fashion.       Clinical Data: No additional findings.  Objective: Vital Signs: There were no vitals taken for this visit.  Physical Exam:   Constitutional: Patient appears well-developed HEENT:  Head: Normocephalic Eyes:EOM are normal Neck: Normal range of motion Cardiovascular: Normal rate Pulmonary/chest: Effort normal Neurologic: Patient is alert Skin: Skin is warm Psychiatric: Patient has normal mood and affect    Ortho Exam: Ortho exam demonstrates valgus alignment right knee.  No real effusion is present.  Collateral cruciate ligaments are stable.  Pedal pulses palpable.  No groin pain with internal X rotation of the leg.  Extensor mechanism is intact.  Range of motion is painful and she has patellofemoral crepitus bilaterally.  Specialty Comments:  No specialty comments available.  Imaging: No results found.   PMFS History: Patient Active Problem List   Diagnosis Date Noted  . BIH (benign intracranial hypertension) 11/06/2017  . Somnolence, daytime 07/30/2017  . Snoring 07/30/2017  . Morbid obesity (HCC) 07/30/2017  . Migraine 10/02/2016  . Common migraine with intractable migraine 10/02/2016  . Type 2 diabetes mellitus without complication, without long-term current  use of insulin (HCC) 08/17/2016   Past Medical History:  Diagnosis Date  . Anxiety   . Common migraine with intractable migraine 10/02/2016  . Depression   . Diabetes mellitus   . Hypertension     Family History  Problem Relation Age of Onset  . Diabetes Mother   . Hypertension Mother   . Renal Disease Mother   . Cancer Father     Past Surgical History:  Procedure Laterality Date  . BTL     Social History   Occupational History  . Not on file  Tobacco Use  . Smoking  status: Current Every Day Smoker    Packs/day: 0.24    Types: Cigarettes  . Smokeless tobacco: Never Used  Substance and Sexual Activity  . Alcohol use: No  . Drug use: No  . Sexual activity: Yes    Birth control/protection: None

## 2018-06-14 ENCOUNTER — Other Ambulatory Visit (INDEPENDENT_AMBULATORY_CARE_PROVIDER_SITE_OTHER): Payer: Self-pay | Admitting: Family Medicine

## 2018-06-14 ENCOUNTER — Telehealth (INDEPENDENT_AMBULATORY_CARE_PROVIDER_SITE_OTHER): Payer: Self-pay | Admitting: Orthopedic Surgery

## 2018-06-14 MED ORDER — TRAMADOL HCL 50 MG PO TABS: 50.0000 mg | ORAL_TABLET | Freq: Two times a day (BID) | ORAL | 0 refills | Status: DC | PRN

## 2018-06-14 NOTE — Telephone Encounter (Signed)
Can you advise? Dr August Saucer will not be back in the office until 03/16

## 2018-06-14 NOTE — Telephone Encounter (Signed)
IC advised done 

## 2018-06-14 NOTE — Telephone Encounter (Signed)
Pt called asking for a refill on her tramadol.

## 2018-06-14 NOTE — Telephone Encounter (Signed)
Sent!

## 2018-07-04 ENCOUNTER — Encounter: Payer: Self-pay | Admitting: Nurse Practitioner

## 2018-07-04 NOTE — Telephone Encounter (Signed)
This encounter was created in error - please disregard.

## 2018-07-08 ENCOUNTER — Other Ambulatory Visit: Payer: Self-pay | Admitting: Primary Care

## 2018-07-08 ENCOUNTER — Other Ambulatory Visit: Payer: Self-pay | Admitting: Pharmacist

## 2018-07-08 DIAGNOSIS — E785 Hyperlipidemia, unspecified: Secondary | ICD-10-CM

## 2018-07-08 MED ORDER — INSULIN PEN NEEDLE 31G X 5 MM MISC: 1 refills | Status: DC

## 2018-07-08 MED ORDER — PRAVASTATIN SODIUM 10 MG PO TABS: 10.0000 mg | ORAL_TABLET | Freq: Every day | ORAL | 0 refills | Status: DC

## 2018-07-26 ENCOUNTER — Other Ambulatory Visit (INDEPENDENT_AMBULATORY_CARE_PROVIDER_SITE_OTHER): Payer: Self-pay | Admitting: Orthopedic Surgery

## 2018-07-26 NOTE — Telephone Encounter (Signed)
Ok to rf? 

## 2018-08-01 ENCOUNTER — Other Ambulatory Visit: Payer: Self-pay

## 2018-08-01 ENCOUNTER — Ambulatory Visit: Payer: Medicaid Other | Attending: Primary Care | Admitting: Primary Care

## 2018-08-01 ENCOUNTER — Other Ambulatory Visit (HOSPITAL_COMMUNITY)
Admission: RE | Admit: 2018-08-01 | Discharge: 2018-08-01 | Disposition: A | Discharge status: Home / Self Care | Payer: Medicaid Other | Source: Ambulatory Visit | Attending: Primary Care | Admitting: Primary Care

## 2018-08-01 ENCOUNTER — Encounter: Payer: Self-pay | Admitting: Primary Care

## 2018-08-01 VITALS — BP 135/80 | HR 81 | Temp 98.0°F | Resp 18 | Ht 65.5 in | Wt 252.0 lb

## 2018-08-01 DIAGNOSIS — F329 Major depressive disorder, single episode, unspecified: Secondary | ICD-10-CM | POA: Insufficient documentation

## 2018-08-01 DIAGNOSIS — N898 Other specified noninflammatory disorders of vagina: Secondary | ICD-10-CM | POA: Diagnosis not present

## 2018-08-01 DIAGNOSIS — Z809 Family history of malignant neoplasm, unspecified: Secondary | ICD-10-CM | POA: Diagnosis not present

## 2018-08-01 DIAGNOSIS — F419 Anxiety disorder, unspecified: Secondary | ICD-10-CM | POA: Insufficient documentation

## 2018-08-01 DIAGNOSIS — Z888 Allergy status to other drugs, medicaments and biological substances status: Secondary | ICD-10-CM | POA: Insufficient documentation

## 2018-08-01 DIAGNOSIS — E119 Type 2 diabetes mellitus without complications: Secondary | ICD-10-CM | POA: Insufficient documentation

## 2018-08-01 DIAGNOSIS — Z79899 Other long term (current) drug therapy: Secondary | ICD-10-CM | POA: Insufficient documentation

## 2018-08-01 DIAGNOSIS — F321 Major depressive disorder, single episode, moderate: Secondary | ICD-10-CM

## 2018-08-01 DIAGNOSIS — Z720 Tobacco use: Secondary | ICD-10-CM

## 2018-08-01 DIAGNOSIS — I10 Essential (primary) hypertension: Secondary | ICD-10-CM | POA: Diagnosis not present

## 2018-08-01 DIAGNOSIS — F17210 Nicotine dependence, cigarettes, uncomplicated: Secondary | ICD-10-CM | POA: Insufficient documentation

## 2018-08-01 DIAGNOSIS — G4733 Obstructive sleep apnea (adult) (pediatric): Secondary | ICD-10-CM | POA: Insufficient documentation

## 2018-08-01 DIAGNOSIS — Z7951 Long term (current) use of inhaled steroids: Secondary | ICD-10-CM | POA: Insufficient documentation

## 2018-08-01 DIAGNOSIS — Z8249 Family history of ischemic heart disease and other diseases of the circulatory system: Secondary | ICD-10-CM | POA: Insufficient documentation

## 2018-08-01 DIAGNOSIS — E785 Hyperlipidemia, unspecified: Secondary | ICD-10-CM

## 2018-08-01 DIAGNOSIS — A64 Unspecified sexually transmitted disease: Secondary | ICD-10-CM | POA: Insufficient documentation

## 2018-08-01 DIAGNOSIS — E782 Mixed hyperlipidemia: Secondary | ICD-10-CM | POA: Diagnosis not present

## 2018-08-01 DIAGNOSIS — Z833 Family history of diabetes mellitus: Secondary | ICD-10-CM | POA: Insufficient documentation

## 2018-08-01 DIAGNOSIS — Z794 Long term (current) use of insulin: Secondary | ICD-10-CM | POA: Diagnosis not present

## 2018-08-01 LAB — POCT URINALYSIS DIP (CLINITEK)
Bilirubin, UA: NEGATIVE
Blood, UA: NEGATIVE
Glucose, UA: NEGATIVE mg/dL
Ketones, POC UA: NEGATIVE mg/dL
Leukocytes, UA: NEGATIVE
Nitrite, UA: NEGATIVE
POC PROTEIN,UA: NEGATIVE
Spec Grav, UA: 1.02 (ref 1.010–1.025)
Urobilinogen, UA: 0.2 E.U./dL
pH, UA: 5.5 (ref 5.0–8.0)

## 2018-08-01 LAB — POCT GLYCOSYLATED HEMOGLOBIN (HGB A1C): Hemoglobin A1C: 6.9 % — AB (ref 4.0–5.6)

## 2018-08-01 MED ORDER — ESCITALOPRAM OXALATE 20 MG PO TABS: 20.0000 mg | ORAL_TABLET | Freq: Every day | ORAL | 3 refills | Status: DC

## 2018-08-01 MED ORDER — INSULIN DETEMIR 100 UNIT/ML FLEXPEN: 50.0000 [IU] | PEN_INJECTOR | Freq: Two times a day (BID) | SUBCUTANEOUS | 3 refills | Status: DC

## 2018-08-01 MED ORDER — ACCU-CHEK SOFT TOUCH LANCETS MISC: 12 refills | Status: DC

## 2018-08-01 MED ORDER — METFORMIN HCL 1000 MG PO TABS: 1000.0000 mg | ORAL_TABLET | Freq: Two times a day (BID) | ORAL | 0 refills | Status: DC

## 2018-08-01 MED ORDER — INSULIN PEN NEEDLE 31G X 5 MM MISC: 1 refills | Status: DC

## 2018-08-01 MED ORDER — INSULIN ASPART 100 UNIT/ML ~~LOC~~ SOLN: 10.0000 [IU] | Freq: Three times a day (TID) | SUBCUTANEOUS | 3 refills | Status: DC

## 2018-08-01 MED ORDER — PRAVASTATIN SODIUM 10 MG PO TABS: 10.0000 mg | ORAL_TABLET | Freq: Every day | ORAL | 0 refills | Status: DC

## 2018-08-01 NOTE — Progress Notes (Signed)
Acute Office Visit  Subjective:    Patient ID: [name], female    DOB: Sep 04, 1970, 48 y.o.   MRN: [id number]  No chief complaint on file.   HPI Patient is in today for Krystin Keeven in today for a acute visit c/o left ear pain with drainage but does not flow out but feels full. She admit to using a Q-tip. She also has a concern with vaginal discharge white clumpy no longer itching after using Vagisil.  PMH of anxiety, depression, DM2,OSA,and HTN      Past Medical History:  Diagnosis Date  . Anxiety   . Common migraine with intractable migraine 10/02/2016  . Depression   . Diabetes mellitus   . Hypertension     Past Surgical History:  Procedure Laterality Date  . BTL      Family History  Problem Relation Age of Onset  . Diabetes Mother   . Hypertension Mother   . Renal Disease Mother   . Cancer Father     Social History   Socioeconomic History  . Marital status: Single    Spouse name: Not on file  . Number of children: Not on file  . Years of education: Not on file  . Highest education level: Not on file  Occupational History  . Not on file  Social Needs  . Financial resource strain: Not on file  . Food insecurity:    Worry: Not on file    Inability: Not on file  . Transportation needs:    Medical: Not on file    Non-medical: Not on file  Tobacco Use  . Smoking status: Current Every Day Smoker    Packs/day: 0.24    Types: Cigarettes  . Smokeless tobacco: Never Used  Substance and Sexual Activity  . Alcohol use: No  . Drug use: No  . Sexual activity: Yes    Birth control/protection: None  Lifestyle  . Physical activity:    Days per week: Not on file    Minutes per session: Not on file  . Stress: Not on file  Relationships  . Social connections:    Talks on phone: Not on file    Gets together: Not on file    Attends religious service: Not on file    Active member of club or organization: Not on file    Attends meetings of clubs or  organizations: Not on file    Relationship status: Not on file  . Intimate partner violence:    Fear of current or ex partner: Not on file    Emotionally abused: Not on file    Physically abused: Not on file    Forced sexual activity: Not on file  Other Topics Concern  . Not on file  Social History Narrative   Right handed    Lives with daughter   Caffeine use: Drinks coffee/tea/soda sometimes    Outpatient Medications Prior to Visit  Medication Sig Dispense Refill  . aspirin EC 81 MG tablet Take 81 mg by mouth every 6 (six) hours as needed for mild pain.     . beclomethasone (QVAR) 40 MCG/ACT inhaler Inhale 2 puffs into the lungs 2 (two) times daily.    . clonazePAM (KLONOPIN) 1 MG tablet Take 1 tablet (1 mg total) by mouth daily. 30 tablet 0  . docusate sodium (COLACE) 100 MG capsule Take 100 mg by mouth daily as needed.     Marland Kitchen escitalopram (LEXAPRO) 20 MG tablet Take 20 mg by mouth  daily.  5  . furosemide (LASIX) 20 MG tablet TAKE 1 TABLET DAILY FOR A WEEK AND THEN TAKE 2 TABLETS DAILY 60 tablet 3  . gabapentin (NEURONTIN) 300 MG capsule TAKE 1 CAPSULE (300 MG TOTAL) BY MOUTH 3 (THREE) TIMES DAILY. 90 capsule 3  . glimepiride (AMARYL) 4 MG tablet Take 1 tablet (4 mg total) by mouth daily before breakfast. 90 tablet 1  . glucose blood (ACCU-CHEK AVIVA PLUS) test strip Use as instructed 100 each 12  . hydrochlorothiazide (HYDRODIURIL) 25 MG tablet Take 1 tablet (25 mg total) by mouth daily. 90 tablet 1  . insulin aspart (NOVOLOG) 100 UNIT/ML injection Inject 10 Units into the skin 3 (three) times daily before meals. 10 mL 11  . Insulin Detemir (LEVEMIR) 100 UNIT/ML Pen Inject 50 Units into the skin 2 (two) times daily. 15 mL 11  . Insulin Pen Needle (EASY COMFORT PEN NEEDLES) 31G X 5 MM MISC USE AS INSTRUCTED. TWICE DAILY. 100 each 1  . Lancets (ACCU-CHEK SOFT TOUCH) lancets Use as instructed 100 each 12  . megestrol (MEGACE) 40 MG tablet TAKE 1 TABLET (40 MG TOTAL) BY MOUTH 2 (TWO)  TIMES DAILY. 60 tablet 0  . metFORMIN (GLUCOPHAGE) 1000 MG tablet Take 1 tablet (1,000 mg total) by mouth 2 (two) times daily with a meal. 180 tablet 3  . nicotine (NICODERM CQ - DOSED IN MG/24 HOURS) 21 mg/24hr patch PLACE 1 PATCH (21 MG TOTAL) ONTO THE SKIN DAILY. 28 patch 0  . nortriptyline (PAMELOR) 25 MG capsule Take 2 capsules (50 mg total) by mouth at bedtime. 60 capsule 3  . potassium chloride (K-DUR) 10 MEQ tablet TAKE 2 TABLETS (20 MEQ TOTAL) BY MOUTH DAILY. 60 tablet 3  . pravastatin (PRAVACHOL) 10 MG tablet Take 1 tablet (10 mg total) by mouth daily. 30 tablet 0  . propranolol (INDERAL) 20 MG tablet 1 tablet twice daily for 2 weeks and then go to 2 tablets twice daily 120 tablet 3  . PROVENTIL HFA 108 (90 Base) MCG/ACT inhaler INHALE 1 TO 2 PUFFS BY MOUTH EVERY 6 (SIX) HOURS AS NEEDED FOR WHEEZING OR SHORTNESS OF BREATH. 6.7 g 5  . traMADol (ULTRAM) 50 MG tablet TAKE ONE TABLET BY MOUTH TWICE DAILY AS NEEDED 40 tablet 0   No facility-administered medications prior to visit.     Allergies  Allergen Reactions  . Lisinopril     angioedema    Review of Systems  Constitutional: Negative.   HENT: Positive for congestion and ear pain.   Eyes: Negative.   Respiratory: Negative.   Cardiovascular: Negative.   Gastrointestinal: Negative.   Genitourinary: Negative.   Musculoskeletal: Negative.   Skin: Negative.   Neurological: Negative.   Endo/Heme/Allergies: Negative.        Objective:    Physical Exam  Constitutional: She is oriented to person, place, and time. She appears well-developed and well-nourished.  HENT:  Right Ear: External ear normal.  Red, painful  Eyes: EOM are normal.  Neck: Normal range of motion. Neck supple.  Cardiovascular: Normal rate and regular rhythm.  Pulmonary/Chest: Effort normal and breath sounds normal.  Abdominal: Soft. Bowel sounds are normal. She exhibits distension.  Genitourinary:    Vaginal discharge present.   Musculoskeletal:  Normal range of motion.  Neurological: She is oriented to person, place, and time.  Skin: Skin is warm.  Psychiatric: She has a normal mood and affect.    BP 135/80 (BP Location: Left Arm, Patient Position: Sitting, Cuff Size: Large)  Pulse 81   Temp 98 F (36.7 C) (Oral)   Resp 18   Ht 5' 5.5" (1.664 m)   Wt 252 lb (114.3 kg)   SpO2 98%   BMI 41.30 kg/m  Wt Readings from Last 3 Encounters:  08/01/18 252 lb (114.3 kg)  03/12/18 253 lb 6.4 oz (114.9 kg)  01/30/18 237 lb (107.5 kg)    Health Maintenance Due  Topic Date Due  . FOOT EXAM  04/24/1978  . PAP SMEAR-Modifier  04/24/1989  . URINE MICROALBUMIN  08/17/2017  . MAMMOGRAM  04/24/2018  . COLONOSCOPY  04/24/2018  . HEMOGLOBIN A1C  07/03/2018    There are no preventive care reminders to display for this patient.   Lab Results  Component Value Date   TSH 4.840 (H) 01/09/2018   Lab Results  Component Value Date   WBC 12.6 (H) 01/30/2018   HGB 13.6 01/30/2018   HCT 42.9 01/30/2018   MCV 76 (L) 01/30/2018   PLT 325 01/30/2018   Lab Results  Component Value Date   NA 143 12/11/2017   K 4.4 12/11/2017   CO2 21 12/11/2017   GLUCOSE 178 (H) 12/11/2017   BUN 12 12/11/2017   CREATININE 0.79 12/11/2017   BILITOT <0.2 12/11/2017   ALKPHOS 154 (H) 12/11/2017   AST 28 12/11/2017   ALT 70 (H) 12/11/2017   PROT 7.5 12/11/2017   ALBUMIN 4.4 12/11/2017   CALCIUM 10.2 12/11/2017   ANIONGAP 12 05/29/2016   Lab Results  Component Value Date   CHOL 159 08/17/2016   Lab Results  Component Value Date   HDL 48 08/17/2016   Lab Results  Component Value Date   LDLCALC 98 08/17/2016   Lab Results  Component Value Date   TRIG 66 08/17/2016   Lab Results  Component Value Date   CHOLHDL 3.3 08/17/2016   Lab Results  Component Value Date   HGBA1C 7.0 (A) 01/02/2018       Assessment & Plan:   Problem List Items Addressed This Visit    None    Ron was seen today for diabetes, ear drainage and sexually  transmitted disease.  Diagnoses and all orders for this visit:  Type 2 diabetes mellitus without complication, without long-term current use of insulin (HCC) -     HgB A1c -     Microalbumin, urine -     CMP14+Fe+CBC/D/Plt+TIBC+Fer... -     Lipid panel -     insulin aspart (NOVOLOG) 100 UNIT/ML injection; Inject 10 Units into the skin 3 (three) times daily before meals. -     Insulin Detemir (LEVEMIR) 100 UNIT/ML Pen; Inject 50 Units into the skin 2 (two) times daily. -     Lancets (ACCU-CHEK SOFT TOUCH) lancets; Use as instructed -     metFORMIN (GLUCOPHAGE) 1000 MG tablet; Take 1 tablet (1,000 mg total) by mouth 2 (two) times daily with a meal.  Vaginal discharge -     POCT URINALYSIS DIP (CLINITEK) -     Urine cytology ancillary only  Essential hypertension -     CMP14+Fe+CBC/D/Plt+TIBC+Fer...  Mixed hyperlipidemia -     Lipid panel  Tobacco abuse Each visit d/w cessation  Hyperlipidemia, unspecified hyperlipidemia type -     pravastatin (PRAVACHOL) 10 MG tablet; Take 1 tablet (10 mg total) by mouth daily.  Other orders -     escitalopram (LEXAPRO) 20 MG tablet; Take 1 tablet (20 mg total) by mouth daily. -     Insulin Pen Needle (  EASY COMFORT PEN NEEDLES) 31G X 5 MM MISC; USE AS INSTRUCTED. TWICE DAILY. -     Urine cytology ancillary only     No orders of the defined types were placed in this encounter.    Grayce Sessions, NP

## 2018-08-02 LAB — MICROALBUMIN, URINE: Microalbumin, Urine: 12.6 ug/mL

## 2018-08-05 LAB — CMP14+FE+CBC/D/PLT+TIBC+FER...
ALT: 41 IU/L — ABNORMAL HIGH (ref 0–32)
AST: 28 IU/L (ref 0–40)
Albumin/Globulin Ratio: 1.5 (ref 1.2–2.2)
Albumin: 4.3 g/dL (ref 3.8–4.8)
Alkaline Phosphatase: 135 IU/L — ABNORMAL HIGH (ref 39–117)
BUN/Creatinine Ratio: 16 (ref 9–23)
BUN: 13 mg/dL (ref 6–24)
Basophils Absolute: 0 10*3/uL (ref 0.0–0.2)
Basos: 0 %
Bilirubin Total: 0.4 mg/dL (ref 0.0–1.2)
CO2: 23 mmol/L (ref 20–29)
Calcium: 9.9 mg/dL (ref 8.7–10.2)
Chloride: 106 mmol/L (ref 96–106)
Coenzyme Q10, Total: 0.96 ug/mL (ref 0.37–2.20)
Creatinine, Ser: 0.82 mg/dL (ref 0.57–1.00)
EOS (ABSOLUTE): 0.1 10*3/uL (ref 0.0–0.4)
Eos: 1 %
Ferritin: 110 ng/mL (ref 15–150)
Folate: 6.9 ng/mL (ref >3.0)
GFR calc Af Amer: 96 mL/min/{1.73_m2} (ref >59)
GFR calc non Af Amer: 84 mL/min/{1.73_m2} (ref >59)
Globulin, Total: 2.9 g/dL (ref 1.5–4.5)
Glucose: 154 mg/dL — ABNORMAL HIGH (ref 65–99)
Hematocrit: 45.6 % (ref 34.0–46.6)
Hemoglobin: 14.5 g/dL (ref 11.1–15.9)
Homocysteine: 10.6 umol/L (ref 0.0–14.5)
Immature Grans (Abs): 0 10*3/uL (ref 0.0–0.1)
Immature Granulocytes: 0 %
Iron Saturation: 35 % (ref 15–55)
Iron: 110 ug/dL (ref 27–159)
Lymphocytes Absolute: 3.9 10*3/uL — ABNORMAL HIGH (ref 0.7–3.1)
Lymphs: 34 %
MCH: 24.5 pg — ABNORMAL LOW (ref 26.6–33.0)
MCHC: 31.8 g/dL (ref 31.5–35.7)
MCV: 77 fL — ABNORMAL LOW (ref 79–97)
Monocytes Absolute: 0.6 10*3/uL (ref 0.1–0.9)
Monocytes: 5 %
Neutrophils Absolute: 6.7 10*3/uL (ref 1.4–7.0)
Neutrophils: 60 %
Platelets: 319 10*3/uL (ref 150–450)
Potassium: 4.5 mmol/L (ref 3.5–5.2)
RBC: 5.91 x10E6/uL — ABNORMAL HIGH (ref 3.77–5.28)
RDW: 15.8 % — ABNORMAL HIGH (ref 11.7–15.4)
Sodium: 143 mmol/L (ref 134–144)
Testosterone: 10 ng/dL (ref 3–41)
Total Iron Binding Capacity: 310 ug/dL (ref 250–450)
Total Protein: 7.2 g/dL (ref 6.0–8.5)
Transferrin: 260 mg/dL (ref 192–364)
UIBC: 200 ug/dL (ref 131–425)
Vit D, 25-Hydroxy: 13.6 ng/mL — ABNORMAL LOW (ref 30.0–100.0)
Vitamin B-12: 301 pg/mL (ref 232–1245)
WBC: 11.4 10*3/uL — ABNORMAL HIGH (ref 3.4–10.8)
Zinc: 112 ug/dL (ref 56–134)

## 2018-08-05 LAB — LIPID PANEL
Chol/HDL Ratio: 4.6 ratio — ABNORMAL HIGH (ref 0.0–4.4)
Cholesterol, Total: 185 mg/dL (ref 100–199)
HDL: 40 mg/dL (ref >39)
LDL Calculated: 125 mg/dL — ABNORMAL HIGH (ref 0–99)
Triglycerides: 102 mg/dL (ref 0–149)
VLDL Cholesterol Cal: 20 mg/dL (ref 5–40)

## 2018-08-07 LAB — URINE CYTOLOGY ANCILLARY ONLY
Bacterial vaginitis: NEGATIVE
Candida vaginitis: NEGATIVE
Chlamydia: NEGATIVE
Neisseria Gonorrhea: NEGATIVE
Trichomonas: NEGATIVE

## 2018-08-08 ENCOUNTER — Other Ambulatory Visit: Payer: Self-pay | Admitting: Neurology

## 2018-08-08 ENCOUNTER — Telehealth: Payer: Self-pay | Admitting: *Deleted

## 2018-08-08 NOTE — Telephone Encounter (Signed)
-----   Message from Grayce Sessions, NP sent at 08/07/2018  9:14 PM EDT ----- Let pt know results normal

## 2018-08-08 NOTE — Telephone Encounter (Signed)
Patient verified DOB Patient is aware of labs being normal and needing to avoid alcohol and tylenol. Patient advised to use clairtin and nasal spray for ear pressure and follow up Monday.

## 2018-08-28 ENCOUNTER — Encounter: Payer: Self-pay | Admitting: Orthopedic Surgery

## 2018-08-28 ENCOUNTER — Telehealth: Payer: Self-pay

## 2018-08-28 ENCOUNTER — Ambulatory Visit (INDEPENDENT_AMBULATORY_CARE_PROVIDER_SITE_OTHER): Payer: Medicaid Other | Admitting: Orthopedic Surgery

## 2018-08-28 ENCOUNTER — Other Ambulatory Visit: Payer: Self-pay

## 2018-08-28 DIAGNOSIS — M1712 Unilateral primary osteoarthritis, left knee: Secondary | ICD-10-CM | POA: Diagnosis not present

## 2018-08-28 DIAGNOSIS — M1711 Unilateral primary osteoarthritis, right knee: Secondary | ICD-10-CM

## 2018-08-28 MED ORDER — LIDOCAINE HCL 1 % IJ SOLN
5.0000 mL | INTRAMUSCULAR | Status: AC | PRN
  Administered 2018-08-28: 16:00:00 5 mL

## 2018-08-28 MED ORDER — BUPIVACAINE HCL 0.25 % IJ SOLN
4.0000 mL | INTRAMUSCULAR | Status: AC | PRN
  Administered 2018-08-28: 16:00:00 4 mL via INTRA_ARTICULAR

## 2018-08-28 MED ORDER — METHYLPREDNISOLONE ACETATE 40 MG/ML IJ SUSP
40.0000 mg | INTRAMUSCULAR | Status: AC | PRN
  Administered 2018-08-28: 40 mg via INTRA_ARTICULAR

## 2018-08-28 NOTE — Telephone Encounter (Signed)
Patient came back after her appointment and is requesting something for pain. Please advise.

## 2018-08-28 NOTE — Progress Notes (Addendum)
Office Visit Note   Patient: [name]           Date of Birth: October 27, 1970           MRN: [id number] Visit Date: 08/28/2018 Requested by: Loletta SpecterGomez, Roger David, PA-C No address on file PCP: Loletta SpecterGomez, Roger David, PA-C  Subjective: Chief Complaint  Patient presents with  . Right Knee - Pain  . Left Knee - Pain    HPI: [name] is a 48 year old patient with bilateral knee arthritis.  Right is worse than left.  She was denied disability.  She has had an injection last one several months ago.  They do help her right knee.  She is having some new left knee pain as well.  Denies any mechanical symptoms or injury in that left knee.              ROS: All systems reviewed are negative as they relate to the chief complaint within the history of present illness.  Patient denies  fevers or chills.   Assessment & Plan: Visit Diagnoses:  1. Unilateral primary osteoarthritis, left knee   2. Unilateral primary osteoarthritis, right knee     Plan: Impression is bilateral knee arthritis right worse than left.  We will inject the right one today and then give it a week for her blood glucose to normalize and then inject the left 1 is a scheduled injection.  She is heading for knee replacement.  Follow-up with me as needed.  But I will see her back in a week.  Follow-Up Instructions: Return in about 1 week (around 09/04/2018).   Orders:  No orders of the defined types were placed in this encounter.  No orders of the defined types were placed in this encounter.     Procedures: Large Joint Inj: R knee on 08/28/2018 3:37 PM Indications: diagnostic evaluation, joint swelling and pain Details: 18 G 1.5 in needle, superolateral approach  Arthrogram: No  Medications: 5 mL lidocaine 1 %; 40 mg methylPREDNISolone acetate 40 MG/ML; 4 mL bupivacaine 0.25 % Outcome: tolerated well, no immediate complications Procedure, treatment alternatives, risks and benefits explained, specific risks discussed. Consent  was given by the patient. Immediately prior to procedure a time out was called to verify the correct patient, procedure, equipment, support staff and site/side marked as required. Patient was prepped and draped in the usual sterile fashion.       Clinical Data: No additional findings.  Objective: Vital Signs: There were no vitals taken for this visit.  Physical Exam:   Constitutional: Patient appears well-developed HEENT:  Head: Normocephalic Eyes:EOM are normal Neck: Normal range of motion Cardiovascular: Normal rate Pulmonary/chest: Effort normal Neurologic: Patient is alert Skin: Skin is warm Psychiatric: Patient has normal mood and affect    Ortho Exam: Ortho exam demonstrates full active and passive range of motion of the hips and ankles.  Right knee has valgus alignment.  No effusion in either knee.  Flexion past 90 is present.  Extensor mechanism intact bilaterally.  Specialty Comments:  No specialty comments available.  Imaging: No results found.   PMFS History: Patient Active Problem List   Diagnosis Date Noted  . BIH (benign intracranial hypertension) 11/06/2017  . Somnolence, daytime 07/30/2017  . Snoring 07/30/2017  . Morbid obesity (HCC) 07/30/2017  . Migraine 10/02/2016  . Common migraine with intractable migraine 10/02/2016  . Type 2 diabetes mellitus without complication, without long-term current use of insulin (HCC) 08/17/2016   Past Medical History:  Diagnosis Date  .  Anxiety   . Common migraine with intractable migraine 10/02/2016  . Depression   . Diabetes mellitus   . Hypertension     Family History  Problem Relation Age of Onset  . Diabetes Mother   . Hypertension Mother   . Renal Disease Mother   . Cancer Father     Past Surgical History:  Procedure Laterality Date  . BTL     Social History   Occupational History  . Not on file  Tobacco Use  . Smoking status: Current Every Day Smoker    Packs/day: 0.24    Types:  Cigarettes  . Smokeless tobacco: Never Used  Substance and Sexual Activity  . Alcohol use: No  . Drug use: No  . Sexual activity: Yes    Birth control/protection: None

## 2018-08-29 MED ORDER — TRAMADOL HCL 50 MG PO TABS: ORAL_TABLET | ORAL | 0 refills | Status: DC

## 2018-08-29 NOTE — Telephone Encounter (Signed)
Okay for tramadol 1 p.o. every 8 hours as needed pain #35 with no refills

## 2018-08-29 NOTE — Addendum Note (Signed)
Addended byPrescott Parma on: 08/29/2018 10:42 AM   Modules accepted: Orders

## 2018-08-29 NOTE — Telephone Encounter (Signed)
Called to pharmacy. Advised patient done.  

## 2018-08-30 ENCOUNTER — Telehealth: Payer: Self-pay | Admitting: Physician Assistant

## 2018-08-30 NOTE — Telephone Encounter (Signed)
Patient called stating she wants a HIV test done asap

## 2018-09-03 ENCOUNTER — Other Ambulatory Visit: Payer: Self-pay | Admitting: Primary Care

## 2018-09-03 DIAGNOSIS — Z9189 Other specified personal risk factors, not elsewhere classified: Secondary | ICD-10-CM

## 2018-09-03 NOTE — Telephone Encounter (Signed)
Schedule patient for lab visit please

## 2018-09-03 NOTE — Telephone Encounter (Signed)
This can be done at the [facility] order REASON

## 2018-09-03 NOTE — Progress Notes (Signed)
Patients call returned.  Patient identified by name and date of birth.  Patient advised that an order for her lab draw has been placed and she could come to the lab and get it drawn.  Patient stated she would be coming tomorrow.  Patient acknowledged understanding of advice.

## 2018-09-05 ENCOUNTER — Ambulatory Visit: Payer: Medicaid Other | Admitting: Orthopedic Surgery

## 2018-09-07 ENCOUNTER — Telehealth: Payer: Self-pay | Admitting: Internal Medicine

## 2018-09-07 ENCOUNTER — Other Ambulatory Visit: Payer: Medicaid Other

## 2018-09-07 DIAGNOSIS — Z20822 Contact with and (suspected) exposure to covid-19: Secondary | ICD-10-CM

## 2018-09-07 NOTE — Telephone Encounter (Signed)
Patient showed up testing site.  She has been scheduled.

## 2018-09-07 NOTE — Telephone Encounter (Signed)
Spoke with the patient about possible Covid-19 exposure at 08/28/18 office visit to [facility].  RN explained the free Covid-19 screening offered for this exposure.  Notified that she and her driver should wear a masks and the drive-up screening will take place at the old [facility] on [address].  Patient was driving in her vehicle.  Needs to call her work to arrange a time to come in for screening.  Given call back # [phone].

## 2018-09-10 LAB — NOVEL CORONAVIRUS, NAA: SARS-CoV-2, NAA: NOT DETECTED

## 2018-09-11 ENCOUNTER — Ambulatory Visit (INDEPENDENT_AMBULATORY_CARE_PROVIDER_SITE_OTHER): Payer: Medicaid Other | Admitting: Orthopedic Surgery

## 2018-09-11 ENCOUNTER — Telehealth: Payer: Self-pay

## 2018-09-11 ENCOUNTER — Other Ambulatory Visit: Payer: Self-pay

## 2018-09-11 ENCOUNTER — Ambulatory Visit: Payer: Medicaid Other | Admitting: Neurology

## 2018-09-11 ENCOUNTER — Encounter: Payer: Self-pay | Admitting: Orthopedic Surgery

## 2018-09-11 DIAGNOSIS — M1712 Unilateral primary osteoarthritis, left knee: Secondary | ICD-10-CM

## 2018-09-11 NOTE — Telephone Encounter (Signed)
Pt. Given COVID 19 results, verbalizes understanding. 

## 2018-09-12 ENCOUNTER — Encounter: Payer: Self-pay | Admitting: Orthopedic Surgery

## 2018-09-12 NOTE — Progress Notes (Signed)
Office Visit Note   Patient: [name]           Date of Birth: 1971-04-01           MRN: [id number] Visit Date: 09/11/2018 Requested by: Loletta Specter, PA-C No address on file PCP: Loletta Specter, PA-C  Subjective: Chief Complaint  Patient presents with  . Left Knee - Pain    HPI: [name] is a patient with known bilateral knee arthritis.  Had right knee injected 520 and did well with that with no increase in glucose.  She wanted to have both knees injected at that time.  Presents now for her for left knee injection.  Her left knee however is not particularly symptomatic at this time.  Right knee is doing well.              ROS: All systems reviewed are negative as they relate to the chief complaint within the history of present illness.  Patient denies  fevers or chills.   Assessment & Plan: Visit Diagnoses:  1. Unilateral primary osteoarthritis, left knee     Plan: Impression is bilateral knee arthritis.  Right knee is doing well with injection but left knee which was symptomatic at the time of the right knee injection is now asymptomatic.  I will hold off on left knee injection until she is symptomatic.  I did tell her that in general these cortisone shots worked better to treat pain as opposed to prevent pain.  I will see her back as needed  Follow-Up Instructions: Return if symptoms worsen or fail to improve.   Orders:  No orders of the defined types were placed in this encounter.  No orders of the defined types were placed in this encounter.     Procedures: No procedures performed   Clinical Data: No additional findings.  Objective: Vital Signs: There were no vitals taken for this visit.  Physical Exam:   Constitutional: Patient appears well-developed HEENT:  Head: Normocephalic Eyes:EOM are normal Neck: Normal range of motion Cardiovascular: Normal rate Pulmonary/chest: Effort normal Neurologic: Patient is alert Skin: Skin is warm  Psychiatric: Patient has normal mood and affect    Ortho Exam: Ortho exam demonstrates full active and passive range of motion of the left knee.  There is no effusion.  No real joint line tenderness.  Gait is normal.  Pedal pulses palpable.  No other masses lymphadenopathy or skin changes noted in that left knee region.  Specialty Comments:  No specialty comments available.  Imaging: No results found.   PMFS History: Patient Active Problem List   Diagnosis Date Noted  . BIH (benign intracranial hypertension) 11/06/2017  . Somnolence, daytime 07/30/2017  . Snoring 07/30/2017  . Morbid obesity (HCC) 07/30/2017  . Migraine 10/02/2016  . Common migraine with intractable migraine 10/02/2016  . Type 2 diabetes mellitus without complication, without long-term current use of insulin (HCC) 08/17/2016   Past Medical History:  Diagnosis Date  . Anxiety   . Common migraine with intractable migraine 10/02/2016  . Depression   . Diabetes mellitus   . Hypertension     Family History  Problem Relation Age of Onset  . Diabetes Mother   . Hypertension Mother   . Renal Disease Mother   . Cancer Father     Past Surgical History:  Procedure Laterality Date  . BTL     Social History   Occupational History  . Not on file  Tobacco Use  . Smoking status:  Current Every Day Smoker    Packs/day: 0.24    Types: Cigarettes  . Smokeless tobacco: Never Used  Substance and Sexual Activity  . Alcohol use: No  . Drug use: No  . Sexual activity: Yes    Birth control/protection: None

## 2018-09-18 ENCOUNTER — Other Ambulatory Visit: Payer: Self-pay | Admitting: Primary Care

## 2018-09-18 ENCOUNTER — Telehealth (INDEPENDENT_AMBULATORY_CARE_PROVIDER_SITE_OTHER): Payer: Self-pay | Admitting: Physician Assistant

## 2018-09-18 DIAGNOSIS — E119 Type 2 diabetes mellitus without complications: Secondary | ICD-10-CM

## 2018-09-18 MED ORDER — GLIMEPIRIDE 4 MG PO TABS: 4.0000 mg | ORAL_TABLET | Freq: Every day | ORAL | 0 refills | Status: DC

## 2018-09-18 NOTE — Telephone Encounter (Signed)
Patient called to request med refill for glimepiride (AMARYL) 4 MG tablet    patient uses [facility] [place]  Please advise  Thank you Emmit Pomfret

## 2018-09-18 NOTE — Telephone Encounter (Signed)
Refill meds she is getting steroids shots from orth

## 2018-09-18 NOTE — Telephone Encounter (Signed)
FWD to PCP. [name], CMA  

## 2018-10-02 ENCOUNTER — Other Ambulatory Visit (HOSPITAL_COMMUNITY)
Admission: RE | Admit: 2018-10-02 | Discharge: 2018-10-02 | Disposition: A | Discharge status: Home / Self Care | Payer: Medicaid Other | Source: Ambulatory Visit | Attending: Primary Care | Admitting: Primary Care

## 2018-10-02 ENCOUNTER — Ambulatory Visit (INDEPENDENT_AMBULATORY_CARE_PROVIDER_SITE_OTHER): Payer: Medicaid Other | Admitting: Primary Care

## 2018-10-02 ENCOUNTER — Encounter (INDEPENDENT_AMBULATORY_CARE_PROVIDER_SITE_OTHER): Payer: Self-pay | Admitting: Primary Care

## 2018-10-02 ENCOUNTER — Other Ambulatory Visit: Payer: Self-pay

## 2018-10-02 VITALS — BP 133/73 | HR 86 | Temp 98.2°F | Ht 65.5 in | Wt 252.6 lb

## 2018-10-02 DIAGNOSIS — Z7251 High risk heterosexual behavior: Secondary | ICD-10-CM | POA: Insufficient documentation

## 2018-10-02 DIAGNOSIS — R232 Flushing: Secondary | ICD-10-CM | POA: Diagnosis not present

## 2018-10-02 DIAGNOSIS — N898 Other specified noninflammatory disorders of vagina: Secondary | ICD-10-CM | POA: Diagnosis not present

## 2018-10-02 DIAGNOSIS — Z1211 Encounter for screening for malignant neoplasm of colon: Secondary | ICD-10-CM | POA: Diagnosis not present

## 2018-10-02 LAB — POCT URINALYSIS DIPSTICK
Bilirubin, UA: NEGATIVE
Blood, UA: NEGATIVE
Glucose, UA: NEGATIVE
Ketones, UA: NEGATIVE
Leukocytes, UA: NEGATIVE
Nitrite, UA: NEGATIVE
Protein, UA: POSITIVE — AB
Spec Grav, UA: >=1.03 — AB (ref 1.010–1.025)
Urobilinogen, UA: 0.2 E.U./dL
pH, UA: 5.5 (ref 5.0–8.0)

## 2018-10-02 NOTE — Progress Notes (Signed)
Acute Office Visit  Subjective:    Patient ID: [name], female    DOB: [birth year], 48 y.o.   MRN: [id number]  Chief Complaint  Patient presents with  . Vaginal Discharge    odor   Patient is in today for vaginal discharge with odor, burning with urination, vagina dryness and loss of libido. She is in a relationship where her partner has been absent for 11 years. He is always ready she is feeling not again. PMI: listed below.  Past Medical History:  Diagnosis Date  . Anxiety   . Common migraine with intractable migraine 10/02/2016  . Depression   . Diabetes mellitus   . Hypertension     Past Surgical History:  Procedure Laterality Date  . BTL      Family History  Problem Relation Age of Onset  . Diabetes Mother   . Hypertension Mother   . Renal Disease Mother   . Cancer Father     Social History   Socioeconomic History  . Marital status: Single    Spouse name: Not on file  . Number of children: Not on file  . Years of education: Not on file  . Highest education level: Not on file  Occupational History  . Not on file  Social Needs  . Financial resource strain: Not on file  . Food insecurity    Worry: Not on file    Inability: Not on file  . Transportation needs    Medical: Not on file    Non-medical: Not on file  Tobacco Use  . Smoking status: Current Every Day Smoker    Packs/day: 0.24    Types: Cigarettes  . Smokeless tobacco: Never Used  Substance and Sexual Activity  . Alcohol use: No  . Drug use: No  . Sexual activity: Yes    Birth control/protection: None  Lifestyle  . Physical activity    Days per week: Not on file    Minutes per session: Not on file  . Stress: Not on file  Relationships  . Social Musicianconnections    Talks on phone: Not on file    Gets together: Not on file    Attends religious service: Not on file    Active member of club or organization: Not on file    Attends meetings of clubs or organizations: Not on file   Relationship status: Not on file  . Intimate partner violence    Fear of current or ex partner: Not on file    Emotionally abused: Not on file    Physically abused: Not on file    Forced sexual activity: Not on file  Other Topics Concern  . Not on file  Social History Narrative   Right handed    Lives with daughter   Caffeine use: Drinks coffee/tea/soda sometimes    Outpatient Medications Prior to Visit  Medication Sig Dispense Refill  . aspirin EC 81 MG tablet Take 81 mg by mouth every 6 (six) hours as needed for mild pain.     Marland Kitchen. escitalopram (LEXAPRO) 20 MG tablet Take 1 tablet (20 mg total) by mouth daily. 30 tablet 3  . furosemide (LASIX) 20 MG tablet TAKE 1 TABLET DAILY FOR A WEEK AND THEN TAKE 2 TABLETS DAILY 60 tablet 3  . gabapentin (NEURONTIN) 300 MG capsule TAKE 1 CAPSULE (300 MG TOTAL) BY MOUTH 3 (THREE) TIMES DAILY. 90 capsule 3  . glimepiride (AMARYL) 4 MG tablet Take 1 tablet (4 mg total) by mouth  daily before breakfast. 90 tablet 0  . glucose blood (ACCU-CHEK AVIVA PLUS) test strip Use as instructed 100 each 12  . insulin aspart (NOVOLOG) 100 UNIT/ML injection Inject 10 Units into the skin 3 (three) times daily before meals. 10 mL 3  . Insulin Detemir (LEVEMIR) 100 UNIT/ML Pen Inject 50 Units into the skin 2 (two) times daily. 15 mL 3  . Insulin Pen Needle (EASY COMFORT PEN NEEDLES) 31G X 5 MM MISC USE AS INSTRUCTED. TWICE DAILY. 100 each 1  . Lancets (ACCU-CHEK SOFT TOUCH) lancets Use as instructed 100 each 12  . nortriptyline (PAMELOR) 25 MG capsule Take 2 capsules (50 mg total) by mouth at bedtime. 60 capsule 3  . potassium chloride (K-DUR) 10 MEQ tablet TAKE 2 TABLETS (20 MEQ TOTAL) BY MOUTH DAILY. 60 tablet 3  . pravastatin (PRAVACHOL) 10 MG tablet Take 1 tablet (10 mg total) by mouth daily. 30 tablet 0  . propranolol (INDERAL) 20 MG tablet 1 tablet twice daily for 2 weeks and then go to 2 tablets twice daily 120 tablet 3  . traMADol (ULTRAM) 50 MG tablet TAKE ONE  TABLET BY MOUTH TWICE DAILY AS NEEDED 40 tablet 0  . metFORMIN (GLUCOPHAGE) 1000 MG tablet Take 1 tablet (1,000 mg total) by mouth 2 (two) times daily with a meal. 180 tablet 0  . PROVENTIL HFA 108 (90 Base) MCG/ACT inhaler INHALE 1 TO 2 PUFFS BY MOUTH EVERY 6 (SIX) HOURS AS NEEDED FOR WHEEZING OR SHORTNESS OF BREATH. 6.7 g 5  . acetaZOLAMIDE (DIAMOX) 500 MG capsule TAKE ONE CAPSULE IN THE MORNING AND 2 CAPSULES IN THE EVENING    . megestrol (MEGACE) 40 MG tablet TAKE 1 TABLET (40 MG TOTAL) BY MOUTH 2 (TWO) TIMES DAILY. 60 tablet 0  . nicotine (NICODERM CQ - DOSED IN MG/24 HOURS) 21 mg/24hr patch PLACE 1 PATCH (21 MG TOTAL) ONTO THE SKIN DAILY. 28 patch 0  . traMADol (ULTRAM) 50 MG tablet 1 po q 8 hrs prn pain 30 tablet 0   No facility-administered medications prior to visit.     Allergies  Allergen Reactions  . Lisinopril     angioedema    Review of Systems  Genitourinary: Positive for frequency.  All other systems reviewed and are negative.      Objective:    Physical Exam  Constitutional: She is oriented to person, place, and time. She appears well-developed and well-nourished.  Neck: Normal range of motion. Neck supple.  Cardiovascular: Normal rate and regular rhythm.  Pulmonary/Chest: Effort normal and breath sounds normal.  Abdominal: Soft. Bowel sounds are normal. She exhibits distension.  Genitourinary:    Vaginal discharge present.     Genitourinary Comments: dryness   Neurological: She is oriented to person, place, and time.  Skin: Skin is warm and dry.  Psychiatric: She has a normal mood and affect.    BP 133/73 (BP Location: Left Arm, Patient Position: Sitting, Cuff Size: Large)   Pulse 86   Temp 98.2 F (36.8 C) (Oral)   Ht 5' 5.5" (1.664 m)   Wt 252 lb 9.6 oz (114.6 kg)   LMP 06/03/2018 (Approximate)   SpO2 94%   BMI 41.40 kg/m  Wt Readings from Last 3 Encounters:  10/10/18 248 lb (112.5 kg)  10/02/18 252 lb 9.6 oz (114.6 kg)  08/01/18 252 lb (114.3  kg)    Health Maintenance Due  Topic Date Due  . FOOT EXAM  04/24/1978  . PAP SMEAR-Modifier  04/24/1989  . MAMMOGRAM  04/24/2018  . COLONOSCOPY  04/24/2018    There are no preventive care reminders to display for this patient.   Lab Results  Component Value Date   TSH 2.870 10/02/2018   Lab Results  Component Value Date   WBC 11.4 (H) 08/01/2018   HGB 14.5 08/01/2018   HCT 45.6 08/01/2018   MCV 77 (L) 08/01/2018   PLT 319 08/01/2018   Lab Results  Component Value Date   NA 143 08/01/2018   K 4.5 08/01/2018   CO2 23 08/01/2018   GLUCOSE 154 (H) 08/01/2018   BUN 13 08/01/2018   CREATININE 0.82 08/01/2018   BILITOT 0.4 08/01/2018   ALKPHOS 135 (H) 08/01/2018   AST 28 08/01/2018   ALT 41 (H) 08/01/2018   PROT 7.2 08/01/2018   ALBUMIN 4.3 08/01/2018   CALCIUM 9.9 08/01/2018   ANIONGAP 12 05/29/2016   Lab Results  Component Value Date   CHOL 185 08/01/2018   Lab Results  Component Value Date   HDL 40 08/01/2018   Lab Results  Component Value Date   LDLCALC 125 (H) 08/01/2018   Lab Results  Component Value Date   TRIG 102 08/01/2018   Lab Results  Component Value Date   CHOLHDL 4.6 (H) 08/01/2018   Lab Results  Component Value Date   HGBA1C 7.5 (A) 10/10/2018       Assessment & Plan:   Problem List Items Addressed This Visit    Hot flashes   Relevant Medications   acetaZOLAMIDE (DIAMOX) 500 MG capsule   Other Relevant Orders   FSH/LH (Completed)   Estrogens, Total (Completed)   TSH (Completed)   POCT urine pregnancy   Vaginal dryness   Relevant Orders   Estrogens, Total (Completed)    Other Visit Diagnoses    Vaginal discharge    -  Primary   Relevant Orders   Urinalysis Dipstick (Completed)   Cervicovaginal ancillary only (Completed)   Special screening for malignant neoplasms, colon       Relevant Orders   Fecal occult blood, imunochemical([facility])   High risk heterosexual behavior       Relevant Orders   HIV Antibody  (routine testing w rflx) (Completed)   Cervicovaginal ancillary only (Completed)    [name] was seen today for vaginal discharge.  Diagnoses and all orders for this visit:  Vaginal discharge -     Urinalysis Dipstick -     Cervicovaginal ancillary only  Special screening for malignant neoplasms, colon -     Cancel: Fecal occult blood, imunochemical([facility]) -     Fecal occult blood, imunochemical([facility]); Future  Hot flashes  absent of menstrual cycle for 1 entire year than this February she started having menstrual cycles -     Cancel: Cervicovaginal ancillary only -     FSH/LH -     Estrogens, Total -     TSH -     POCT urine pregnancy  Vaginal dryness (atropic vaginosis) recommended lubrication samples given, also coconut oil can be used but flavored lubricants can give BV or candida advised patient   High risk heterosexual behavior -     HIV Antibody (routine testing w rflx) -     Cervicovaginal ancillary only     No orders of the defined types were placed in this encounter.    Grayce SessionsMichelle P Edwards, NP

## 2018-10-02 NOTE — Patient Instructions (Signed)
Atrophic Vaginitis Atrophic vaginitis is a condition in which the tissues that line the vagina become dry and thin. This condition occurs in women who have stopped having their period. It is caused by a drop in a female hormone (estrogen). This hormone helps:  To keep the vagina moist.  To make a clear fluid. This clear fluid helps: ? To make the vagina ready for sex. ? To protect the vagina from infection. If the lining of the vagina is dry and thin, it may cause irritation, burning, or itchiness. It may also:  Make sex painful.  Make an exam of your vagina painful.  Cause bleeding.  Make you lose interest in sex.  Cause a burning feeling when you pee (urinate).  Cause a brown or yellow fluid to come from your vagina. Some women do not have symptoms. Follow these instructions at home: Medicines  Take over-the-counter and prescription medicines only as told by your doctor.  Do not use herbs or other medicines unless your doctor says it is okay.  Use medicines for for dryness. These include: ? Oils to make the vagina soft. ? Creams. ? Moisturizers. General instructions  Do not douche.  Do not use products that can make your vagina dry. These include: ? Scented sprays. ? Scented tampons. ? Scented soaps.  Sex can help increase blood flow and soften the tissue in the vagina. If it hurts to have sex: ? Tell your partner. ? Use products to make sex more comfortable. Use these only as told by your doctor. Contact a doctor if you:  Have discharge from the vagina that is different than usual.  Have a bad smell coming from your vagina.  Have new symptoms.  Do not get better.  Get worse. Summary  Atrophic vaginitis is a condition in which the lining of the vagina becomes dry and thin.  This condition affects women who have stopped having their periods.  Treatment may include using products that help make the vagina soft.  Call a doctor if do not get better with  treatment. This information is not intended to replace advice given to you by your health care provider. Make sure you discuss any questions you have with your health care provider. Document Released: 09/13/2007 Document Revised: 04/09/2017 Document Reviewed: 04/09/2017 Elsevier Interactive Patient Education  2019 [employer]  

## 2018-10-03 ENCOUNTER — Other Ambulatory Visit: Payer: Self-pay | Admitting: Primary Care

## 2018-10-03 ENCOUNTER — Other Ambulatory Visit: Payer: Self-pay | Admitting: Orthopedic Surgery

## 2018-10-03 DIAGNOSIS — E119 Type 2 diabetes mellitus without complications: Secondary | ICD-10-CM

## 2018-10-03 NOTE — Telephone Encounter (Signed)
y

## 2018-10-03 NOTE — Telephone Encounter (Signed)
Ok to rf? 

## 2018-10-04 LAB — CERVICOVAGINAL ANCILLARY ONLY
Bacterial vaginitis: POSITIVE — AB
Candida vaginitis: NEGATIVE
Chlamydia: NEGATIVE
Neisseria Gonorrhea: NEGATIVE
Trichomonas: NEGATIVE

## 2018-10-04 NOTE — Telephone Encounter (Signed)
Rx called into pharm 

## 2018-10-04 NOTE — Telephone Encounter (Signed)
Called patient she is aware.

## 2018-10-06 LAB — FSH/LH
FSH: 33 m[IU]/mL
LH: 20.3 m[IU]/mL

## 2018-10-06 LAB — ESTROGENS, TOTAL: Estrogen: 71 pg/mL

## 2018-10-06 LAB — TSH: TSH: 2.87 u[IU]/mL (ref 0.450–4.500)

## 2018-10-06 LAB — HIV ANTIBODY (ROUTINE TESTING W REFLEX): HIV Screen 4th Generation wRfx: NONREACTIVE

## 2018-10-08 ENCOUNTER — Other Ambulatory Visit: Payer: Self-pay

## 2018-10-08 DIAGNOSIS — J45909 Unspecified asthma, uncomplicated: Secondary | ICD-10-CM

## 2018-10-08 MED ORDER — PROVENTIL HFA 108 (90 BASE) MCG/ACT IN AERS: INHALATION_SPRAY | RESPIRATORY_TRACT | 1 refills | Status: DC

## 2018-10-10 ENCOUNTER — Other Ambulatory Visit (HOSPITAL_COMMUNITY)
Admission: RE | Admit: 2018-10-10 | Discharge: 2018-10-10 | Disposition: A | Discharge status: Home / Self Care | Payer: Medicaid Other | Source: Ambulatory Visit | Attending: Primary Care | Admitting: Primary Care

## 2018-10-10 ENCOUNTER — Encounter (INDEPENDENT_AMBULATORY_CARE_PROVIDER_SITE_OTHER): Payer: Self-pay | Admitting: Primary Care

## 2018-10-10 ENCOUNTER — Ambulatory Visit (INDEPENDENT_AMBULATORY_CARE_PROVIDER_SITE_OTHER): Payer: Medicaid Other | Admitting: Primary Care

## 2018-10-10 ENCOUNTER — Other Ambulatory Visit: Payer: Self-pay

## 2018-10-10 VITALS — BP 120/82 | HR 101 | Temp 98.3°F | Ht 65.5 in | Wt 248.0 lb

## 2018-10-10 DIAGNOSIS — Z1239 Encounter for other screening for malignant neoplasm of breast: Secondary | ICD-10-CM

## 2018-10-10 DIAGNOSIS — E119 Type 2 diabetes mellitus without complications: Secondary | ICD-10-CM

## 2018-10-10 DIAGNOSIS — Z124 Encounter for screening for malignant neoplasm of cervix: Secondary | ICD-10-CM | POA: Diagnosis not present

## 2018-10-10 DIAGNOSIS — Z113 Encounter for screening for infections with a predominantly sexual mode of transmission: Secondary | ICD-10-CM | POA: Diagnosis not present

## 2018-10-10 LAB — POCT GLYCOSYLATED HEMOGLOBIN (HGB A1C): Hemoglobin A1C: 7.5 % — AB (ref 4.0–5.6)

## 2018-10-10 LAB — GLUCOSE, POCT (MANUAL RESULT ENTRY): POC Glucose: 201 mg/dl — AB (ref 70–99)

## 2018-10-10 NOTE — Progress Notes (Signed)
Allergies  Allergen Reactions  . Lisinopril     angioedema    Subjective:  Patient ID: [name], female    DOB: 27-Dec-1970  Age: 48 y.o. MRN: [id number]  CC: Gynecologic Exam   HPI [name] presents for cervical cancer screening and sexually transmitted disease.   Outpatient Medications Prior to Visit  Medication Sig Dispense Refill  . acetaZOLAMIDE (DIAMOX) 500 MG capsule TAKE ONE CAPSULE IN THE MORNING AND 2 CAPSULES IN THE EVENING    . aspirin EC 81 MG tablet Take 81 mg by mouth every 6 (six) hours as needed for mild pain.     Marland Kitchen. escitalopram (LEXAPRO) 20 MG tablet Take 1 tablet (20 mg total) by mouth daily. 30 tablet 3  . furosemide (LASIX) 20 MG tablet TAKE 1 TABLET DAILY FOR A WEEK AND THEN TAKE 2 TABLETS DAILY 60 tablet 3  . gabapentin (NEURONTIN) 300 MG capsule TAKE 1 CAPSULE (300 MG TOTAL) BY MOUTH 3 (THREE) TIMES DAILY. 90 capsule 3  . glimepiride (AMARYL) 4 MG tablet Take 1 tablet (4 mg total) by mouth daily before breakfast. 90 tablet 0  . glucose blood (ACCU-CHEK AVIVA PLUS) test strip Use as instructed 100 each 12  . insulin aspart (NOVOLOG) 100 UNIT/ML injection Inject 10 Units into the skin 3 (three) times daily before meals. 10 mL 3  . Insulin Detemir (LEVEMIR) 100 UNIT/ML Pen Inject 50 Units into the skin 2 (two) times daily. 15 mL 3  . Insulin Pen Needle (EASY COMFORT PEN NEEDLES) 31G X 5 MM MISC USE AS INSTRUCTED. TWICE DAILY. 100 each 1  . Lancets (ACCU-CHEK SOFT TOUCH) lancets Use as instructed 100 each 12  . megestrol (MEGACE) 40 MG tablet TAKE 1 TABLET (40 MG TOTAL) BY MOUTH 2 (TWO) TIMES DAILY. 60 tablet 0  . metFORMIN (GLUCOPHAGE) 1000 MG tablet TAKE 1 TABLET (1,000 MG TOTAL) BY MOUTH 2 (TWO) TIMES DAILY WITH A MEAL. 180 tablet 0  . nicotine (NICODERM CQ - DOSED IN MG/24 HOURS) 21 mg/24hr patch PLACE 1 PATCH (21 MG TOTAL) ONTO THE SKIN DAILY. 28 patch 0  . nortriptyline (PAMELOR) 25 MG capsule Take 2 capsules (50 mg total) by mouth at bedtime. 60  capsule 3  . potassium chloride (K-DUR) 10 MEQ tablet TAKE 2 TABLETS (20 MEQ TOTAL) BY MOUTH DAILY. 60 tablet 3  . pravastatin (PRAVACHOL) 10 MG tablet Take 1 tablet (10 mg total) by mouth daily. 30 tablet 0  . propranolol (INDERAL) 20 MG tablet 1 tablet twice daily for 2 weeks and then go to 2 tablets twice daily 120 tablet 3  . PROVENTIL HFA 108 (90 Base) MCG/ACT inhaler INHALE 1 TO 2 PUFFS BY MOUTH EVERY 6 (SIX) HOURS AS NEEDED FOR WHEEZING OR SHORTNESS OF BREATH. 6.7 g 1  . traMADol (ULTRAM) 50 MG tablet TAKE ONE TABLET BY MOUTH TWICE DAILY AS NEEDED 40 tablet 0   No facility-administered medications prior to visit.     ROS Review of Systems  All other systems reviewed and are negative.   Objective:  BP 120/82 (BP Location: Left Arm, Patient Position: Sitting, Cuff Size: Large)   Pulse (!) 101   Temp 98.3 F (36.8 C) (Oral)   Ht 5' 5.5" (1.664 m)   Wt 248 lb (112.5 kg)   SpO2 94%   BMI 40.64 kg/m   BP/Weight 10/10/2018 10/02/2018 08/01/2018  Systolic BP 120 133 135  Diastolic BP 82 73 80  Wt. (Lbs) 248 252.6 252  BMI 40.64 41.4 41.3  Physical Exam Exam conducted with a chaperone present.  Cardiovascular:     Rate and Rhythm: Normal rate and regular rhythm.  Pulmonary:     Breath sounds: Normal breath sounds and air entry.  Chest:     Breasts:        Right: Normal.        Left: Normal.  Abdominal:     General: Abdomen is protuberant. Bowel sounds are normal.     Palpations: Abdomen is soft.     Hernia: There is no hernia in the left inguinal area or right inguinal area.  Genitourinary:    General: Normal vulva.     Exam position: Lithotomy position.     Pubic Area: No rash or pubic lice.      Labia:        Right: No rash or tenderness.        Left: No rash or tenderness.      Urethra: No prolapse or urethral pain.     Vagina: Normal.     Cervix: Normal.     Uterus: Normal. Tender.      Adnexa: Right adnexa normal and left adnexa normal.  Lymphadenopathy:      Upper Body:     Right upper body: Supraclavicular adenopathy, axillary adenopathy and pectoral adenopathy present.     Left upper body: Supraclavicular adenopathy, axillary adenopathy and pectoral adenopathy present.      Assessment & Plan:   [name] was seen today for gynecologic exam.  Diagnoses and all orders for this visit:  Type 2 diabetes mellitus without complication, without long-term current use of insulin ([place]) ADA recommends the following therapeutic goals for glycemic control related to A1c measurements: Goal of therapy: Less than 6.5 hemoglobin A1c.  Reference clinical practice recommendations. Foods that are high in carbohydrates are the following rice, potatoes, breads, sugars, and pastas.  Reduction in the intake (eating) will assist in lowering your blood sugars. -     Glucose (CBG) -     HgB A1c  Encounter for Papanicolaou smear for cervical cancer screening -     Cervicovaginal ancillary only -     Cytology - PAP([place])  Encounter for screening breast examination Taught self breast exam start exam at  Encounter for screening examination for sexually transmitted disease -     Cervicovaginal ancillary only   No orders of the defined types were placed in this encounter.   Follow-up: Return in about 3 months (around 01/10/2019) for DM.   Kerin Perna NP

## 2018-10-10 NOTE — Patient Instructions (Signed)

## 2018-10-14 LAB — CYTOLOGY - PAP
Adequacy: ABSENT
Diagnosis: NEGATIVE

## 2018-10-14 LAB — CERVICOVAGINAL ANCILLARY ONLY
Bacterial vaginitis: POSITIVE — AB
Candida vaginitis: NEGATIVE
Chlamydia: NEGATIVE
Neisseria Gonorrhea: NEGATIVE
Trichomonas: NEGATIVE

## 2018-10-14 MED ORDER — METRONIDAZOLE 500 MG PO TABS: 500.0000 mg | ORAL_TABLET | Freq: Two times a day (BID) | ORAL | 0 refills | Status: DC

## 2018-10-15 ENCOUNTER — Telehealth (INDEPENDENT_AMBULATORY_CARE_PROVIDER_SITE_OTHER): Payer: Self-pay

## 2018-10-15 NOTE — Telephone Encounter (Signed)
Patient is aware that pap was normal. Negative for gonorrhea, chlamydia and trichomonas. Positive for BV. Medication for BV sent to pharmacy. Patient expressed understanding and did not have any additional questions. Nat Christen, CMA

## 2018-10-15 NOTE — Telephone Encounter (Signed)
-----   Message from Kerin Perna, NP sent at 10/14/2018 11:21 PM EDT ----- BV sent in medication

## 2018-10-28 ENCOUNTER — Other Ambulatory Visit: Payer: Self-pay | Admitting: Primary Care

## 2018-10-28 DIAGNOSIS — E785 Hyperlipidemia, unspecified: Secondary | ICD-10-CM

## 2018-10-28 NOTE — Telephone Encounter (Signed)
FWD to PCP. Tempestt S Roberts, CMA  

## 2018-10-31 ENCOUNTER — Other Ambulatory Visit: Payer: Self-pay | Admitting: Primary Care

## 2018-10-31 DIAGNOSIS — E119 Type 2 diabetes mellitus without complications: Secondary | ICD-10-CM

## 2018-11-08 ENCOUNTER — Other Ambulatory Visit: Payer: Self-pay | Admitting: Primary Care

## 2018-11-21 ENCOUNTER — Other Ambulatory Visit: Payer: Self-pay | Admitting: Primary Care

## 2018-11-21 DIAGNOSIS — E119 Type 2 diabetes mellitus without complications: Secondary | ICD-10-CM

## 2018-11-27 ENCOUNTER — Other Ambulatory Visit: Payer: Self-pay | Admitting: Surgical

## 2018-11-27 ENCOUNTER — Telehealth: Payer: Self-pay | Admitting: Orthopedic Surgery

## 2018-11-27 MED ORDER — TRAMADOL HCL 50 MG PO TABS: 50.0000 mg | ORAL_TABLET | Freq: Two times a day (BID) | ORAL | 0 refills | Status: DC | PRN

## 2018-11-27 NOTE — Telephone Encounter (Signed)
Patient left a message requesting an RX refill on her Tramadol.  She would like someone to call her if Dr. Marlou Sa authorizes the Tramadol.  CB#[phone].  Thank you.

## 2018-11-27 NOTE — Telephone Encounter (Signed)
Can you please submit this electronically? 

## 2018-11-27 NOTE — Telephone Encounter (Signed)
sure

## 2018-11-27 NOTE — Telephone Encounter (Signed)
Ok to refill 

## 2018-11-28 NOTE — Telephone Encounter (Signed)
Tried calling to advise submitted. No answer. No VM to LM.  

## 2018-12-10 ENCOUNTER — Other Ambulatory Visit: Payer: Self-pay | Admitting: Adult Health

## 2018-12-10 ENCOUNTER — Other Ambulatory Visit (INDEPENDENT_AMBULATORY_CARE_PROVIDER_SITE_OTHER): Payer: Self-pay | Admitting: Primary Care

## 2018-12-10 ENCOUNTER — Other Ambulatory Visit: Payer: Self-pay | Admitting: Primary Care

## 2018-12-10 DIAGNOSIS — F321 Major depressive disorder, single episode, moderate: Secondary | ICD-10-CM

## 2018-12-10 DIAGNOSIS — E119 Type 2 diabetes mellitus without complications: Secondary | ICD-10-CM

## 2018-12-10 MED ORDER — ESCITALOPRAM OXALATE 20 MG PO TABS: 20.0000 mg | ORAL_TABLET | Freq: Every day | ORAL | 0 refills | Status: DC

## 2018-12-10 NOTE — Telephone Encounter (Signed)
COMPLETED

## 2018-12-10 NOTE — Telephone Encounter (Signed)
FWD to PCP. Tempestt S Roberts, CMA  

## 2018-12-25 ENCOUNTER — Other Ambulatory Visit (INDEPENDENT_AMBULATORY_CARE_PROVIDER_SITE_OTHER): Payer: Self-pay | Admitting: Family Medicine

## 2018-12-26 ENCOUNTER — Other Ambulatory Visit: Payer: Self-pay | Admitting: Surgical

## 2018-12-26 ENCOUNTER — Telehealth: Payer: Self-pay | Admitting: Orthopedic Surgery

## 2018-12-26 MED ORDER — TRAMADOL HCL 50 MG PO TABS: 50.0000 mg | ORAL_TABLET | Freq: Two times a day (BID) | ORAL | 0 refills | Status: DC | PRN

## 2018-12-26 NOTE — Telephone Encounter (Signed)
Please advise. Thanks.  

## 2018-12-26 NOTE — Telephone Encounter (Signed)
Can you please submit?

## 2018-12-26 NOTE — Telephone Encounter (Signed)
Okay for tramadol 1 p.o. every 12 hours as needed pain #30

## 2018-12-26 NOTE — Telephone Encounter (Signed)
Patient called. Would like pain meds called in. Says she is in a lot of pain. Her call back number is [phone]

## 2019-01-06 ENCOUNTER — Other Ambulatory Visit: Payer: Self-pay | Admitting: Surgical

## 2019-01-06 NOTE — Telephone Encounter (Signed)
This is a GD pt 

## 2019-01-06 NOTE — Telephone Encounter (Signed)
Ok to rf? 

## 2019-01-10 ENCOUNTER — Encounter (INDEPENDENT_AMBULATORY_CARE_PROVIDER_SITE_OTHER): Payer: Self-pay | Admitting: Primary Care

## 2019-01-10 ENCOUNTER — Other Ambulatory Visit: Payer: Self-pay

## 2019-01-10 ENCOUNTER — Ambulatory Visit (INDEPENDENT_AMBULATORY_CARE_PROVIDER_SITE_OTHER): Payer: Medicaid Other | Admitting: Primary Care

## 2019-01-10 VITALS — BP 137/85 | HR 69 | Temp 97.3°F | Ht 65.5 in | Wt 248.6 lb

## 2019-01-10 DIAGNOSIS — E119 Type 2 diabetes mellitus without complications: Secondary | ICD-10-CM | POA: Diagnosis not present

## 2019-01-10 DIAGNOSIS — Z1231 Encounter for screening mammogram for malignant neoplasm of breast: Secondary | ICD-10-CM | POA: Diagnosis not present

## 2019-01-10 DIAGNOSIS — J45909 Unspecified asthma, uncomplicated: Secondary | ICD-10-CM

## 2019-01-10 LAB — POCT GLYCOSYLATED HEMOGLOBIN (HGB A1C): Hemoglobin A1C: 6.5 % — AB (ref 4.0–5.6)

## 2019-01-10 LAB — POCT CBG (FASTING - GLUCOSE)-MANUAL ENTRY: Glucose Fasting, POC: 109 mg/dL — AB (ref 70–99)

## 2019-01-10 MED ORDER — INSULIN ASPART 100 UNIT/ML ~~LOC~~ SOLN: 10.0000 [IU] | Freq: Three times a day (TID) | SUBCUTANEOUS | 3 refills | Status: DC

## 2019-01-10 MED ORDER — PROVENTIL HFA 108 (90 BASE) MCG/ACT IN AERS: INHALATION_SPRAY | RESPIRATORY_TRACT | 1 refills | Status: DC

## 2019-01-10 MED ORDER — LEVEMIR FLEXTOUCH 100 UNIT/ML ~~LOC~~ SOPN: PEN_INJECTOR | SUBCUTANEOUS | 3 refills | Status: DC

## 2019-01-10 MED ORDER — METFORMIN HCL 1000 MG PO TABS: 1000.0000 mg | ORAL_TABLET | Freq: Two times a day (BID) | ORAL | 0 refills | Status: DC

## 2019-01-10 NOTE — Patient Instructions (Signed)
° °Calorie Counting for Weight Loss °Calories are units of energy. Your body needs a certain amount of calories from food to keep you going throughout the day. When you eat more calories than your body needs, your body stores the extra calories as fat. When you eat fewer calories than your body needs, your body burns fat to get the energy it needs. °Calorie counting means keeping track of how many calories you eat and drink each day. Calorie counting can be helpful if you need to lose weight. If you make sure to eat fewer calories than your body needs, you should lose weight. Ask your health care provider what a healthy weight is for you. °For calorie counting to work, you will need to eat the right number of calories in a day in order to lose a healthy amount of weight per week. A dietitian can help you determine how many calories you need in a day and will give you suggestions on how to reach your calorie goal. °· A healthy amount of weight to lose per week is usually 1-2 lb (0.5-0.9 kg). This usually means that your daily calorie intake should be reduced by 500-750 calories. °· Eating 1,200 - 1,500 calories per day can help most women lose weight. °· Eating 1,500 - 1,800 calories per day can help most men lose weight. °What is my plan? °My goal is to have __________ calories per day. °If I have this many calories per day, I should lose around __________ pounds per week. °What do I need to know about calorie counting? °In order to meet your daily calorie goal, you will need to: °· Find out how many calories are in each food you would like to eat. Try to do this before you eat. °· Decide how much of the food you plan to eat. °· Write down what you ate and how many calories it had. Doing this is called keeping a food log. °To successfully lose weight, it is important to balance calorie counting with a healthy lifestyle that includes regular activity. Aim for 150 minutes of moderate exercise (such as walking) or 75  minutes of vigorous exercise (such as running) each week. °Where do I find calorie information? ° °The number of calories in a food can be found on a Nutrition Facts label. If a food does not have a Nutrition Facts label, try to look up the calories online or ask your dietitian for help. °Remember that calories are listed per serving. If you choose to have more than one serving of a food, you will have to multiply the calories per serving by the amount of servings you plan to eat. For example, the label on a package of bread might say that a serving size is 1 slice and that there are 90 calories in a serving. If you eat 1 slice, you will have eaten 90 calories. If you eat 2 slices, you will have eaten 180 calories. °How do I keep a food log? °Immediately after each meal, record the following information in your food log: °· What you ate. Don't forget to include toppings, sauces, and other extras on the food. °· How much you ate. This can be measured in cups, ounces, or number of items. °· How many calories each food and drink had. °· The total number of calories in the meal. °Keep your food log near you, such as in a small notebook in your pocket, or use a mobile app or website. Some programs will   calculate calories for you and show you how many calories you have left for the day to meet your goal. °What are some calorie counting tips? ° °· Use your calories on foods and drinks that will fill you up and not leave you hungry: °? Some examples of foods that fill you up are nuts and nut butters, vegetables, lean proteins, and high-fiber foods like whole grains. High-fiber foods are foods with more than 5 g fiber per serving. °? Drinks such as sodas, specialty coffee drinks, alcohol, and juices have a lot of calories, yet do not fill you up. °· Eat nutritious foods and avoid empty calories. Empty calories are calories you get from foods or beverages that do not have many vitamins or protein, such as candy, sweets, and  soda. It is better to have a nutritious high-calorie food (such as an avocado) than a food with few nutrients (such as a bag of chips). °· Know how many calories are in the foods you eat most often. This will help you calculate calorie counts faster. °· Pay attention to calories in drinks. Low-calorie drinks include water and unsweetened drinks. °· Pay attention to nutrition labels for "low fat" or "fat free" foods. These foods sometimes have the same amount of calories or more calories than the full fat versions. They also often have added sugar, starch, or salt, to make up for flavor that was removed with the fat. °· Find a way of tracking calories that works for you. Get creative. Try different apps or programs if writing down calories does not work for you. °What are some portion control tips? °· Know how many calories are in a serving. This will help you know how many servings of a certain food you can have. °· Use a measuring cup to measure serving sizes. You could also try weighing out portions on a kitchen scale. With time, you will be able to estimate serving sizes for some foods. °· Take some time to put servings of different foods on your favorite plates, bowls, and cups so you know what a serving looks like. °· Try not to eat straight from a bag or box. Doing this can lead to overeating. Put the amount you would like to eat in a cup or on a plate to make sure you are eating the right portion. °· Use smaller plates, glasses, and bowls to prevent overeating. °· Try not to multitask (for example, watch TV or use your computer) while eating. If it is time to eat, sit down at a table and enjoy your food. This will help you to know when you are full. It will also help you to be aware of what you are eating and how much you are eating. °What are tips for following this plan? °Reading food labels °· Check the calorie count compared to the serving size. The serving size may be smaller than what you are used to  eating. °· Check the source of the calories. Make sure the food you are eating is high in vitamins and protein and low in saturated and trans fats. °Shopping °· Read nutrition labels while you shop. This will help you make healthy decisions before you decide to purchase your food. °· Make a grocery list and stick to it. °Cooking °· Try to cook your favorite foods in a healthier way. For example, try baking instead of frying. °· Use low-fat dairy products. °Meal planning °· Use more fruits and vegetables. Half of your plate should be   fruits and vegetables. °· Include lean proteins like poultry and fish. °How do I count calories when eating out? °· Ask for smaller portion sizes. °· Consider sharing an entree and sides instead of getting your own entree. °· If you get your own entree, eat only half. Ask for a box at the beginning of your meal and put the rest of your entree in it so you are not tempted to eat it. °· If calories are listed on the menu, choose the lower calorie options. °· Choose dishes that include vegetables, fruits, whole grains, low-fat dairy products, and lean protein. °· Choose items that are boiled, broiled, grilled, or steamed. Stay away from items that are buttered, battered, fried, or served with cream sauce. Items labeled "crispy" are usually fried, unless stated otherwise. °· Choose water, low-fat milk, unsweetened iced tea, or other drinks without added sugar. If you want an alcoholic beverage, choose a lower calorie option such as a glass of wine or light beer. °· Ask for dressings, sauces, and syrups on the side. These are usually high in calories, so you should limit the amount you eat. °· If you want a salad, choose a garden salad and ask for grilled meats. Avoid extra toppings like bacon, cheese, or fried items. Ask for the dressing on the side, or ask for olive oil and vinegar or lemon to use as dressing. °· Estimate how many servings of a food you are given. For example, a serving of  cooked rice is ½ cup or about the size of half a baseball. Knowing serving sizes will help you be aware of how much food you are eating at restaurants. The list below tells you how big or small some common portion sizes are based on everyday objects: °? 1 oz--4 stacked dice. °? 3 oz--1 deck of cards. °? 1 tsp--1 die. °? 1 Tbsp--½ a ping-pong ball. °? 2 Tbsp--1 ping-pong ball. °? ½ cup--½ baseball. °? 1 cup--1 baseball. °Summary °· Calorie counting means keeping track of how many calories you eat and drink each day. If you eat fewer calories than your body needs, you should lose weight. °· A healthy amount of weight to lose per week is usually 1-2 lb (0.5-0.9 kg). This usually means reducing your daily calorie intake by 500-750 calories. °· The number of calories in a food can be found on a Nutrition Facts label. If a food does not have a Nutrition Facts label, try to look up the calories online or ask your dietitian for help. °· Use your calories on foods and drinks that will fill you up, and not on foods and drinks that will leave you hungry. °· Use smaller plates, glasses, and bowls to prevent overeating. °This information is not intended to replace advice given to you by your health care provider. Make sure you discuss any questions you have with your health care provider. °Document Released: 03/27/2005 Document Revised: 12/14/2017 Document Reviewed: 02/25/2016 °Elsevier Patient Education © 2020 [employer] ° °

## 2019-01-10 NOTE — Progress Notes (Signed)
Established Patient Office Visit  Subjective:  Patient ID: [name], female    DOB: [birth year]  Age: 48 y.o. MRN: [id number]  CC: No chief complaint on file.   HPI [name] presents for the management of her diabetes . Today her A1C is 6.5 previously 3 months ago was 7.5. We are pleased with the results. She voices no complaints or concerns.  Past Medical History:  Diagnosis Date  . Anxiety   . Common migraine with intractable migraine 10/02/2016  . Depression   . Diabetes mellitus   . Hypertension     Past Surgical History:  Procedure Laterality Date  . BTL      Family History  Problem Relation Age of Onset  . Diabetes Mother   . Hypertension Mother   . Renal Disease Mother   . Cancer Father     Social History   Socioeconomic History  . Marital status: Single    Spouse name: Not on file  . Number of children: Not on file  . Years of education: Not on file  . Highest education level: Not on file  Occupational History  . Not on file  Social Needs  . Financial resource strain: Not on file  . Food insecurity    Worry: Not on file    Inability: Not on file  . Transportation needs    Medical: Not on file    Non-medical: Not on file  Tobacco Use  . Smoking status: Current Every Day Smoker    Packs/day: 0.24    Types: Cigarettes  . Smokeless tobacco: Never Used  Substance and Sexual Activity  . Alcohol use: No  . Drug use: No  . Sexual activity: Yes    Birth control/protection: None  Lifestyle  . Physical activity    Days per week: Not on file    Minutes per session: Not on file  . Stress: Not on file  Relationships  . Social Herbalist on phone: Not on file    Gets together: Not on file    Attends religious service: Not on file    Active member of club or organization: Not on file    Attends meetings of clubs or organizations: Not on file    Relationship status: Not on file  . Intimate partner violence    Fear of current or  ex partner: Not on file    Emotionally abused: Not on file    Physically abused: Not on file    Forced sexual activity: Not on file  Other Topics Concern  . Not on file  Social History Narrative   Right handed    Lives with daughter   Caffeine use: Drinks coffee/tea/soda sometimes    Outpatient Medications Prior to Visit  Medication Sig Dispense Refill  . aspirin EC 81 MG tablet Take 81 mg by mouth every 6 (six) hours as needed for mild pain.     Marland Kitchen glimepiride (AMARYL) 4 MG tablet TAKE 1 TABLET (4 MG TOTAL) BY MOUTH DAILY BEFORE BREAKFAST. 90 tablet 0  . glucose blood (ACCU-CHEK AVIVA PLUS) test strip Use as instructed 100 each 12  . Lancets (ACCU-CHEK SOFT TOUCH) lancets Use as instructed 100 each 12  . traMADol (ULTRAM) 50 MG tablet TAKE 1 TABLET (50 MG TOTAL) BY MOUTH EVERY 12 (TWELVE) HOURS AS NEEDED. 30 tablet 0  . insulin aspart (NOVOLOG) 100 UNIT/ML injection Inject 10 Units into the skin 3 (three) times daily before meals. 10 mL 3  .  LEVEMIR FLEXTOUCH 100 UNIT/ML Pen INJECT 50 UNITS INTO THE SKIN 2 (TWO) TIMES DAILY. 30 mL 3  . metFORMIN (GLUCOPHAGE) 1000 MG tablet TAKE 1 TABLET (1,000 MG TOTAL) BY MOUTH 2 (TWO) TIMES DAILY WITH A MEAL. 180 tablet 0  . PROVENTIL HFA 108 (90 Base) MCG/ACT inhaler INHALE 1 TO 2 PUFFS BY MOUTH EVERY 6 (SIX) HOURS AS NEEDED FOR WHEEZING OR SHORTNESS OF BREATH. 6.7 g 1  . acetaZOLAMIDE (DIAMOX) 500 MG capsule TAKE ONE CAPSULE IN THE MORNING AND 2 CAPSULES IN THE EVENING    . EASY COMFORT PEN NEEDLES 31G X 5 MM MISC USE AS INSTRUCTED. TWICE DAILY. 100 each 1  . escitalopram (LEXAPRO) 20 MG tablet TAKE 1 TABLET (20 MG TOTAL) BY MOUTH DAILY. (Patient not taking: Reported on 01/10/2019) 30 tablet 3  . furosemide (LASIX) 20 MG tablet TAKE 2 TABLETS DAILY 60 tablet 1  . potassium chloride (K-DUR) 10 MEQ tablet TAKE 2 TABLETS (20 MEQ TOTAL) BY MOUTH DAILY. (Patient not taking: Reported on 01/10/2019) 60 tablet 1  . pravastatin (PRAVACHOL) 10 MG tablet TAKE 1  TABLET (10 MG TOTAL) BY MOUTH DAILY. (Patient not taking: Reported on 01/10/2019) 30 tablet 3  . gabapentin (NEURONTIN) 300 MG capsule TAKE 1 CAPSULE (300 MG TOTAL) BY MOUTH 3 (THREE) TIMES DAILY. 90 capsule 3  . megestrol (MEGACE) 40 MG tablet TAKE 1 TABLET (40 MG TOTAL) BY MOUTH 2 (TWO) TIMES DAILY. 60 tablet 0  . metroNIDAZOLE (FLAGYL) 500 MG tablet Take 1 tablet (500 mg total) by mouth 2 (two) times daily. 14 tablet 0  . nicotine (NICODERM CQ - DOSED IN MG/24 HOURS) 21 mg/24hr patch PLACE 1 PATCH (21 MG TOTAL) ONTO THE SKIN DAILY. 28 patch 0  . propranolol (INDERAL) 20 MG tablet 1 tablet twice daily for 2 weeks and then go to 2 tablets twice daily 120 tablet 3  . traMADol (ULTRAM) 50 MG tablet Take 1 tablet (50 mg total) by mouth 2 (two) times daily as needed. 30 tablet 0   No facility-administered medications prior to visit.     Allergies  Allergen Reactions  . Lisinopril     angioedema    ROS Review of Systems  All other systems reviewed and are negative.     Objective:    Physical Exam  Constitutional: She is oriented to person, place, and time. She appears well-developed and well-nourished.  Neck: Normal range of motion. Neck supple.  Cardiovascular: Normal rate and regular rhythm.  Pulmonary/Chest: Effort normal and breath sounds normal.  Abdominal: Soft. Bowel sounds are normal. She exhibits distension.  Musculoskeletal: Normal range of motion.  Neurological: She is alert and oriented to person, place, and time.  Psychiatric: She has a normal mood and affect.    BP 137/85 (BP Location: Left Arm, Patient Position: Sitting, Cuff Size: Large)   Pulse 69   Temp (!) 97.3 F (36.3 C) (Temporal)   Ht 5' 5.5" (1.664 m)   Wt 248 lb 9.6 oz (112.8 kg)   LMP 05/12/2018 (Approximate)   SpO2 94%   BMI 40.74 kg/m  Wt Readings from Last 3 Encounters:  01/10/19 248 lb 9.6 oz (112.8 kg)  10/10/18 248 lb (112.5 kg)  10/02/18 252 lb 9.6 oz (114.6 kg)     Health Maintenance  Due  Topic Date Due  . FOOT EXAM  04/24/1978  . MAMMOGRAM  04/24/2018  . COLONOSCOPY  04/24/2018  . OPHTHALMOLOGY EXAM  10/20/2018    There are no preventive care reminders  to display for this patient.  Lab Results  Component Value Date   TSH 2.870 10/02/2018   Lab Results  Component Value Date   WBC 11.4 (H) 08/01/2018   HGB 14.5 08/01/2018   HCT 45.6 08/01/2018   MCV 77 (L) 08/01/2018   PLT 319 08/01/2018   Lab Results  Component Value Date   NA 143 08/01/2018   K 4.5 08/01/2018   CO2 23 08/01/2018   GLUCOSE 154 (H) 08/01/2018   BUN 13 08/01/2018   CREATININE 0.82 08/01/2018   BILITOT 0.4 08/01/2018   ALKPHOS 135 (H) 08/01/2018   AST 28 08/01/2018   ALT 41 (H) 08/01/2018   PROT 7.2 08/01/2018   ALBUMIN 4.3 08/01/2018   CALCIUM 9.9 08/01/2018   ANIONGAP 12 05/29/2016   Lab Results  Component Value Date   CHOL 185 08/01/2018   Lab Results  Component Value Date   HDL 40 08/01/2018   Lab Results  Component Value Date   LDLCALC 125 (H) 08/01/2018   Lab Results  Component Value Date   TRIG 102 08/01/2018   Lab Results  Component Value Date   CHOLHDL 4.6 (H) 08/01/2018   Lab Results  Component Value Date   HGBA1C 6.5 (A) 01/10/2019      Assessment & Plan:  Diagnoses and all orders for this visit:  Type 2 diabetes mellitus without complication, without long-term current use of insulin (HCC) -     HgB A1c 6.5 -     Glucose (CBG), Fasting -     metFORMIN (GLUCOPHAGE) 1000 MG tablet; Take 1 tablet (1,000 mg total) by mouth 2 (two) times daily with a meal. -     Insulin Detemir (LEVEMIR FLEXTOUCH) 100 UNIT/ML Pen; INJECT 50 UNITS INTO THE SKIN 2 (TWO) TIMES DAILY. -     insulin aspart (NOVOLOG) 100 UNIT/ML injection; Inject 10 Units into the skin 3 (three) times daily before meals. -     Lipid panel; Future -     Comprehensive metabolic panel; Future -     CBC with Differential/Platelet; Future  Encounter for diabetic foot exam (HCC) Sensory exam  of the foot is normal, tested with the monofilament. Good pulses, no lesions or ulcers, good peripheral pulses.  Encounter for screening mammogram for malignant neoplasm of breast Instructed on how to perform self breast exams  Moderate asthma, unspecified whether complicated, unspecified whether persistent With variable weather increase asthma exacerbation. Refill rescue inhaler will add long acting beta agonist.   -     PROVENTIL HFA 108 (90 Base) MCG/ACT inhaler; INHALE 1 TO 2 PUFFS BY MOUTH EVERY 6 (SIX) HOURS AS NEEDED FOR WHEEZING OR SHORTNESS OF BREATH.     No orders of the defined types were placed in this encounter.   Follow-up: Return in about 3 months (around 04/12/2019) for HTN and fasting labs in person.    Grayce Sessions, NP

## 2019-01-10 NOTE — Progress Notes (Signed)
Pt ate an hour ago

## 2019-01-19 MED ORDER — FLOVENT HFA 44 MCG/ACT IN AERO: 2.0000 | INHALATION_SPRAY | Freq: Two times a day (BID) | RESPIRATORY_TRACT | 12 refills | Status: AC

## 2019-02-03 ENCOUNTER — Other Ambulatory Visit: Payer: Self-pay | Admitting: Surgical

## 2019-02-10 ENCOUNTER — Other Ambulatory Visit: Payer: Self-pay | Admitting: Primary Care

## 2019-02-10 ENCOUNTER — Encounter: Payer: Self-pay | Admitting: Primary Care

## 2019-02-10 ENCOUNTER — Telehealth: Payer: Self-pay | Admitting: Orthopedic Surgery

## 2019-02-10 DIAGNOSIS — E119 Type 2 diabetes mellitus without complications: Secondary | ICD-10-CM

## 2019-02-10 NOTE — Telephone Encounter (Signed)
Patient called. She would like some pain medication called in for her. Says her knees are hurting real bad. Her call back number is [phone]

## 2019-02-10 NOTE — Telephone Encounter (Signed)
Please advise. Thanks.  

## 2019-02-11 NOTE — Telephone Encounter (Signed)
I tried calling patient. No answer. LMVM advising per Lurena Joiner.

## 2019-02-11 NOTE — Telephone Encounter (Signed)
Patient called back and was advised of the message from [place].  She has an appointment with Dr. Marlou Sa on Monday, November 9th, that was the earliest appointment that we could get her into.  Thank you.

## 2019-02-17 ENCOUNTER — Ambulatory Visit: Payer: Self-pay

## 2019-02-17 ENCOUNTER — Ambulatory Visit (INDEPENDENT_AMBULATORY_CARE_PROVIDER_SITE_OTHER): Payer: Medicaid Other | Admitting: Orthopedic Surgery

## 2019-02-17 ENCOUNTER — Ambulatory Visit (INDEPENDENT_AMBULATORY_CARE_PROVIDER_SITE_OTHER): Payer: Medicaid Other

## 2019-02-17 ENCOUNTER — Encounter: Payer: Self-pay | Admitting: Orthopedic Surgery

## 2019-02-17 ENCOUNTER — Other Ambulatory Visit: Payer: Self-pay

## 2019-02-17 VITALS — Ht 65.5 in | Wt 240.0 lb

## 2019-02-17 DIAGNOSIS — G8929 Other chronic pain: Secondary | ICD-10-CM

## 2019-02-17 DIAGNOSIS — M25561 Pain in right knee: Secondary | ICD-10-CM

## 2019-02-17 DIAGNOSIS — M25562 Pain in left knee: Secondary | ICD-10-CM

## 2019-02-17 DIAGNOSIS — M545 Low back pain: Secondary | ICD-10-CM | POA: Diagnosis not present

## 2019-02-17 MED ORDER — HYDROCODONE-ACETAMINOPHEN 5-325 MG PO TABS: ORAL_TABLET | ORAL | 0 refills | Status: DC

## 2019-02-18 ENCOUNTER — Telehealth: Payer: Self-pay | Admitting: Orthopedic Surgery

## 2019-02-18 NOTE — Telephone Encounter (Signed)
Patient called stating that the Hydrocodone is not working for her knee pain and would like something stronger.  Also she is requesting something to relax her when she get her MRI at [facility].  CB#[phone].  Thank you.

## 2019-02-18 NOTE — Telephone Encounter (Signed)
Please advise 

## 2019-02-19 ENCOUNTER — Encounter: Payer: Self-pay | Admitting: Orthopedic Surgery

## 2019-02-19 MED ORDER — DIAZEPAM 5 MG PO TABS: ORAL_TABLET | ORAL | 0 refills | Status: DC

## 2019-02-19 NOTE — Telephone Encounter (Signed)
Called to pharmacy 

## 2019-02-19 NOTE — Telephone Encounter (Signed)
Patient called in again asking about previous message.  Explained message and had been addressed already and looked like valium had been sent in.   Explained the Norco was as strong has he could prescribed, patient states medicine is not touching pain, explain this is already a stronger narcotic he cannot prescribed anything else.

## 2019-02-19 NOTE — Telephone Encounter (Signed)
Nothing stronger than Norco which was written.  Okay for Valium 10 mg 20 minutes before procedure number 3 tablets please call thanks

## 2019-02-19 NOTE — Progress Notes (Signed)
Office Visit Note   Patient: [name]           Date of Birth: 1970-08-01           MRN: [id number] Visit Date: 02/17/2019 Requested by: Kerin Perna, NP [address] [place],  [place] [address] PCP: Kerin Perna, NP  Subjective: Chief Complaint  Patient presents with  . Left Knee - Pain  . Right Knee - Pain    HPI: [name] is a patient with bilateral knee pain which radiates down into the ankles.  She had a left knee injection in June which helped some.  She is a diabetic.  She has been taking Tylenol for pain and states it is not helping.  She states that she has pain throughout the knees which radiates into the lower legs.  Denies much in the way of mechanical symptoms.  She has some low back pain also and also describes paresthesias in the right lower extremity.  She also reports discrete numbness and tingling in the right great toe.  She has had these symptoms for months.  Denies any history of injury.  Rates the pain as 10 out of 10.  Both knees are about the same.              ROS: All systems reviewed are negative as they relate to the chief complaint within the history of present illness.  Patient denies  fevers or chills.   Assessment & Plan: Visit Diagnoses:  1. Chronic pain of both knees     Plan: Impression is pretty significant bilateral lower extremity pain with a significant radicular component.  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  Left knee arthritis is mild.  Pain symptoms are essentially equal in both knees radiating down both legs and she has distinct paresthesias and radicular symptoms on the right-hand side particularly affecting that right great toe.  I think based on the severity and intensity of her pain that this is likely coming from her back.  No knee effusion in either knee today.  Pain is also radiating down to both ankles.  Plan one-time prescription for Norco with MRI of lumbar spine to evaluate  bilateral radiculopathy.  I do not think this level of severity of pain is predictably associated with the asymmetric arthritis she has in both knees.  Follow-Up Instructions: Return for after MRI.   Orders:  Orders Placed This Encounter  Procedures  . XR KNEE 3 VIEW LEFT  . XR KNEE 3 VIEW RIGHT  . XR Lumbar Spine 2-3 Views  . MR Lumbar Spine w/o contrast   Meds ordered this encounter  Medications  . HYDROcodone-acetaminophen (NORCO/VICODIN) 5-325 MG tablet    Sig: 1 po bid prn pain    Dispense:  20 tablet    Refill:  0      Procedures: No procedures performed   Clinical Data: No additional findings.  Objective: Vital Signs: Ht 5' 5.5" (1.664 m)   Wt 240 lb (108.9 kg)   BMI 39.33 kg/m   Physical Exam:   Constitutional: Patient appears well-developed HEENT:  Head: Normocephalic Eyes:EOM are normal Neck: Normal range of motion Cardiovascular: Normal rate Pulmonary/chest: Effort normal Neurologic: Patient is alert Skin: Skin is warm Psychiatric: Patient has normal mood and affect    Ortho Exam: Ortho exam demonstrates no effusion in either knee.  Slight valgus alignment right knee compared to the left.  Pedal pulses palpable.  No nerve root  tension signs.  She does have some paresthesias in the L5 distribution on the right compared to the left.  Negative Babinski bilaterally.  Reflexes symmetric 1+ out of 4 bilateral patella and Achilles.  No real muscle atrophy in either leg.  Mild patellofemoral crepitus more on the right than the left.  She has somewhat difficult gait.  Hip flexion strength 5+ out of 5 bilaterally.  Ankle dorsiflexion plantarflexion strength also 5+ out of 5.  Specialty Comments:  No specialty comments available.  Imaging: No results found.   PMFS History: Patient Active Problem List   Diagnosis Date Noted  . Hot flashes 10/02/2018  . Vaginal dryness 10/02/2018  . BIH (benign intracranial hypertension) 11/06/2017  . Somnolence, daytime  07/30/2017  . Snoring 07/30/2017  . Morbid obesity (HCC) 07/30/2017  . Migraine 10/02/2016  . Common migraine with intractable migraine 10/02/2016  . Type 2 diabetes mellitus without complication, without long-term current use of insulin (HCC) 08/17/2016   Past Medical History:  Diagnosis Date  . Anxiety   . Common migraine with intractable migraine 10/02/2016  . Depression   . Diabetes mellitus   . Hypertension     Family History  Problem Relation Age of Onset  . Diabetes Mother   . Hypertension Mother   . Renal Disease Mother   . Cancer Father     Past Surgical History:  Procedure Laterality Date  . BTL     Social History   Occupational History  . Not on file  Tobacco Use  . Smoking status: Current Every Day Smoker    Packs/day: 0.24    Types: Cigarettes  . Smokeless tobacco: Never Used  Substance and Sexual Activity  . Alcohol use: No  . Drug use: No  . Sexual activity: Yes    Birth control/protection: None

## 2019-02-26 ENCOUNTER — Other Ambulatory Visit: Payer: Self-pay

## 2019-02-26 ENCOUNTER — Encounter (INDEPENDENT_AMBULATORY_CARE_PROVIDER_SITE_OTHER): Payer: Self-pay | Admitting: Primary Care

## 2019-02-26 ENCOUNTER — Ambulatory Visit (INDEPENDENT_AMBULATORY_CARE_PROVIDER_SITE_OTHER): Payer: Medicaid Other | Admitting: Primary Care

## 2019-02-26 DIAGNOSIS — F17200 Nicotine dependence, unspecified, uncomplicated: Secondary | ICD-10-CM | POA: Diagnosis not present

## 2019-02-26 DIAGNOSIS — L989 Disorder of the skin and subcutaneous tissue, unspecified: Secondary | ICD-10-CM

## 2019-02-26 DIAGNOSIS — R059 Cough, unspecified: Secondary | ICD-10-CM

## 2019-02-26 DIAGNOSIS — R05 Cough: Secondary | ICD-10-CM

## 2019-02-26 MED ORDER — BENZONATATE 100 MG PO CAPS: 100.0000 mg | ORAL_CAPSULE | Freq: Two times a day (BID) | ORAL | 0 refills | Status: DC | PRN

## 2019-02-26 NOTE — Progress Notes (Signed)
Virtual Visit via Telephone Note  I connected with [name] on 02/26/19 at  2:50 PM EST by telephone and verified that I am speaking with the correct person using two identifiers.   I discussed the limitations, risks, security and privacy concerns of performing an evaluation and management service by telephone and the availability of in person appointments. I also discussed with the patient that there may be a patient responsible charge related to this service. The patient expressed understanding and agreed to proceed.   History of Present Illness: [name] is having a tele visit she has had a cough productive for 3-4 she has tried over the counter Nyquil and MDI with no relief. She continues to have hot flashes and concern that she has a blister under her right great toe. She needs to be seen she [place] a diabetic.   Past Medical History:  Diagnosis Date  . Anxiety   . Common migraine with intractable migraine 10/02/2016  . Depression   . Diabetes mellitus   . Hypertension    Observations/Objective: Review of Systems  Constitutional:       Hot flashes  HENT: Positive for congestion.   Respiratory: Positive for cough, sputum production and wheezing.   Skin:       Sore under great right toe   All other systems reviewed and are negative.    Assessment and Plan: Diagnoses and all orders for this visit:  Cough Cough for several days productive encourage to stop smoking can help with cold cough and congestion but soothes her nerves. Sputum yellow productive cough keeps her up at night send in pearls.  Tobacco dependence She is aware of the risk of smoking can make cough/cold worst each visit discuss cessation.  Sore on toe Patient is a diabetic transferred to schedule a in person appointment to evaluate the toe and possible wound.  Follow Up Instructions:    I discussed the assessment and treatment plan with the patient. The patient was provided an opportunity to  ask questions and all were answered. The patient agreed with the plan and demonstrated an understanding of the instructions.   The patient was advised to call back or seek an in-person evaluation if the symptoms worsen or if the condition fails to improve as anticipated.  I provided 15 minutes of non-face-to-face time during this encounter.   Kerin Perna, NP

## 2019-02-26 NOTE — Progress Notes (Signed)
Cough x 3-4 days, productive, yellow phlegm. OTC Nyquil, and MDI   Hot flashes    Blister under right great toe. Painful to wear shoes.

## 2019-02-28 ENCOUNTER — Telehealth: Payer: Self-pay | Admitting: Orthopedic Surgery

## 2019-02-28 ENCOUNTER — Ambulatory Visit: Payer: Medicaid Other

## 2019-02-28 NOTE — Telephone Encounter (Signed)
Pt was a no show to her appt for MRI, pt will have to call to reschedule her own appt.

## 2019-02-28 NOTE — Telephone Encounter (Signed)
Patient's appointment was cancelled and I do not see that it has been r/s at this time.

## 2019-03-08 ENCOUNTER — Other Ambulatory Visit: Payer: Self-pay | Admitting: Adult Health

## 2019-03-08 ENCOUNTER — Ambulatory Visit (HOSPITAL_COMMUNITY)
Admission: EM | Admit: 2019-03-08 | Discharge: 2019-03-08 | Disposition: A | Discharge status: Home / Self Care | Payer: Medicaid Other | Attending: Emergency Medicine | Admitting: Emergency Medicine

## 2019-03-08 ENCOUNTER — Other Ambulatory Visit: Payer: Self-pay

## 2019-03-08 ENCOUNTER — Other Ambulatory Visit (INDEPENDENT_AMBULATORY_CARE_PROVIDER_SITE_OTHER): Payer: Self-pay | Admitting: Primary Care

## 2019-03-08 ENCOUNTER — Encounter (HOSPITAL_COMMUNITY): Payer: Self-pay

## 2019-03-08 DIAGNOSIS — E119 Type 2 diabetes mellitus without complications: Secondary | ICD-10-CM

## 2019-03-08 DIAGNOSIS — Z20828 Contact with and (suspected) exposure to other viral communicable diseases: Secondary | ICD-10-CM | POA: Diagnosis not present

## 2019-03-08 DIAGNOSIS — I10 Essential (primary) hypertension: Secondary | ICD-10-CM | POA: Diagnosis not present

## 2019-03-08 DIAGNOSIS — Z20822 Contact with and (suspected) exposure to covid-19: Secondary | ICD-10-CM

## 2019-03-08 NOTE — Discharge Instructions (Signed)
Person Under Monitoring Name: [name]  Location: [place] [id number]   Infection Prevention Recommendations for Individuals Confirmed to have, or Being Evaluated for, 2019 Novel Coronavirus (COVID-19) Infection Who Receive Care at Home  Individuals who are confirmed to have, or are being evaluated for, COVID-19 should follow the prevention steps below until a healthcare provider or local or state health department says they can return to normal activities.  Stay home except to get medical care You should restrict activities outside your home, except for getting medical care. Do not go to work, school, or public areas, and do not use public transportation or taxis.  Call ahead before visiting your doctor Before your medical appointment, call the healthcare provider and tell them that you have, or are being evaluated for, COVID-19 infection. This will help the healthcare providers office take steps to keep other people from getting infected. Ask your healthcare provider to call the local or state health department.  Monitor your symptoms Seek prompt medical attention if your illness is worsening (e.g., difficulty breathing). Before going to your medical appointment, call the healthcare provider and tell them that you have, or are being evaluated for, COVID-19 infection. Ask your healthcare provider to call the local or state health department.  Wear a facemask You should wear a facemask that covers your nose and mouth when you are in the same room with other people and when you visit a healthcare provider. People who live with or visit you should also wear a facemask while they are in the same room with you.  Separate yourself from other people in your home As much as possible, you should stay in a different room from other people in your home. Also, you should use a separate bathroom, if available.  Avoid sharing household items You should not share  dishes, drinking glasses, cups, eating utensils, towels, bedding, or other items with other people in your home. After using these items, you should wash them thoroughly with soap and water.  Cover your coughs and sneezes Cover your mouth and nose with a tissue when you cough or sneeze, or you can cough or sneeze into your sleeve. Throw used tissues in a lined trash can, and immediately wash your hands with soap and water for at least 20 seconds or use an alcohol-based hand rub.  Wash your [facility] your hands often and thoroughly with soap and water for at least 20 seconds. You can use an alcohol-based hand sanitizer if soap and water are not available and if your hands are not visibly dirty. Avoid touching your eyes, nose, and mouth with unwashed hands.   Prevention Steps for Caregivers and Household Members of Individuals Confirmed to have, or Being Evaluated for, COVID-19 Infection Being Cared for in the Home  If you live with, or provide care at home for, a person confirmed to have, or being evaluated for, COVID-19 infection please follow these guidelines to prevent infection:  Follow healthcare providers instructions Make sure that you understand and can help the patient follow any healthcare provider instructions for all care.  Provide for the patients basic needs You should help the patient with basic needs in the home and provide support for getting groceries, prescriptions, and other personal needs.  Monitor the patients symptoms If they are getting sicker, call his or her medical provider and tell them that the patient has, or is being evaluated for, COVID-19 infection. This will help the healthcare providers office take  steps to keep other people from getting infected. Ask the healthcare provider to call the local or state health department.  Limit the number of people who have contact with the patient If possible, have only one caregiver for the patient. Other  household members should stay in another home or place of residence. If this is not possible, they should stay in another room, or be separated from the patient as much as possible. Use a separate bathroom, if available. Restrict visitors who do not have an essential need to be in the home.  Keep older adults, very young children, and other sick people away from the patient Keep older adults, very young children, and those who have compromised immune systems or chronic health conditions away from the patient. This includes people with chronic heart, lung, or kidney conditions, diabetes, and cancer.  Ensure good ventilation Make sure that shared spaces in the home have good air flow, such as from an air conditioner or an opened window, weather permitting.  Wash your hands often Wash your hands often and thoroughly with soap and water for at least 20 seconds. You can use an alcohol based hand sanitizer if soap and water are not available and if your hands are not visibly dirty. Avoid touching your eyes, nose, and mouth with unwashed hands. Use disposable paper towels to dry your hands. If not available, use dedicated cloth towels and replace them when they become wet.  Wear a facemask and gloves Wear a disposable facemask at all times in the room and gloves when you touch or have contact with the patients blood, body fluids, and/or secretions or excretions, such as sweat, saliva, sputum, nasal mucus, vomit, urine, or feces.  Ensure the mask fits over your nose and mouth tightly, and do not touch it during use. Throw out disposable facemasks and gloves after using them. Do not reuse. Wash your hands immediately after removing your facemask and gloves. If your personal clothing becomes contaminated, carefully remove clothing and launder. Wash your hands after handling contaminated clothing. Place all used disposable facemasks, gloves, and other waste in a lined container before disposing them with  other household waste. Remove gloves and wash your hands immediately after handling these items.  Do not share dishes, glasses, or other household items with the patient Avoid sharing household items. You should not share dishes, drinking glasses, cups, eating utensils, towels, bedding, or other items with a patient who is confirmed to have, or being evaluated for, COVID-19 infection. After the person uses these items, you should wash them thoroughly with soap and water.  Wash laundry thoroughly Immediately remove and wash clothes or bedding that have blood, body fluids, and/or secretions or excretions, such as sweat, saliva, sputum, nasal mucus, vomit, urine, or feces, on them. Wear gloves when handling laundry from the patient. Read and follow directions on labels of laundry or clothing items and detergent. In general, wash and dry with the warmest temperatures recommended on the label.  Clean all areas the individual has used often Clean all touchable surfaces, such as counters, tabletops, doorknobs, bathroom fixtures, toilets, phones, keyboards, tablets, and bedside tables, every day. Also, clean any surfaces that may have blood, body fluids, and/or secretions or excretions on them. Wear gloves when cleaning surfaces the patient has come in contact with. Use a diluted bleach solution (e.g., dilute bleach with 1 part bleach and 10 parts water) or a household disinfectant with a label that says EPA-registered for coronaviruses. To make a bleach solution  at home, add 1 tablespoon of bleach to 1 quart (4 cups) of water. For a larger supply, add  cup of bleach to 1 gallon (16 cups) of water. Read labels of cleaning products and follow recommendations provided on product labels. Labels contain instructions for safe and effective use of the cleaning product including precautions you should take when applying the product, such as wearing gloves or eye protection and making sure you have good ventilation  during use of the product. Remove gloves and wash hands immediately after cleaning.  Monitor yourself for signs and symptoms of illness Caregivers and household members are considered close contacts, should monitor their health, and will be asked to limit movement outside of the home to the extent possible. Follow the monitoring steps for close contacts listed on the symptom monitoring form.   ? If you have additional questions, contact your local health department or call the epidemiologist on call at [phone] (available 24/7). ? This guidance is subject to change. For the most up-to-date guidance from [facility], please refer to their website: [email or web address]

## 2019-03-08 NOTE — ED Triage Notes (Signed)
Pt is here to be tested for covid 19, pt denies any symptoms at this time. This is for a class

## 2019-03-09 NOTE — ED Provider Notes (Signed)
[facility]    CSN: [id number] Arrival date & time: 03/08/19  1630      History   Chief Complaint Chief Complaint  Patient presents with  . Covid 19    HPI [name] is a 48 y.o. female history of hypertension, diabetes, presenting today for Covid testing.  Is needing Covid testing for class.  She has not had any symptoms or exposures.  HPI  Past Medical History:  Diagnosis Date  . Anxiety   . Common migraine with intractable migraine 10/02/2016  . Depression   . Diabetes mellitus   . Hypertension     Patient Active Problem List   Diagnosis Date Noted  . Hot flashes 10/02/2018  . Vaginal dryness 10/02/2018  . BIH (benign intracranial hypertension) 11/06/2017  . Somnolence, daytime 07/30/2017  . Snoring 07/30/2017  . Morbid obesity (HCC) 07/30/2017  . Migraine 10/02/2016  . Common migraine with intractable migraine 10/02/2016  . Type 2 diabetes mellitus without complication, without long-term current use of insulin (HCC) 08/17/2016    Past Surgical History:  Procedure Laterality Date  . BTL      OB History    Gravida  5   Para  3   Term  3   Preterm      AB  2   Living  3     SAB  1   TAB  1   Ectopic      Multiple      Live Births               Home Medications    Prior to Admission medications   Medication Sig Start Date End Date Taking? Authorizing Provider  acetaZOLAMIDE (DIAMOX) 500 MG capsule TAKE ONE CAPSULE IN THE MORNING AND 2 CAPSULES IN THE EVENING 04/22/18   [provider]  aspirin EC 81 MG tablet Take 81 mg by mouth every 6 (six) hours as needed for mild pain.     [provider]  benzonatate (TESSALON) 100 MG capsule Take 1 capsule (100 mg total) by mouth 2 (two) times daily as needed for cough. 02/26/19   Grayce Sessions, NP  EASY COMFORT PEN NEEDLES 31G X 5 MM MISC USE AS INSTRUCTED. TWICE DAILY. 11/08/18   Grayce Sessions, NP  escitalopram (LEXAPRO) 20 MG tablet TAKE 1 TABLET  (20 MG TOTAL) BY MOUTH DAILY. 12/10/18   Grayce Sessions, NP  fluticasone (FLOVENT HFA) 44 MCG/ACT inhaler Inhale 2 puffs into the lungs 2 (two) times daily. 01/19/19   Grayce Sessions, NP  furosemide (LASIX) 20 MG tablet TAKE 2 TABLETS DAILY 12/10/18   Butch Penny, NP  glimepiride (AMARYL) 4 MG tablet TAKE 1 TABLET (4 MG TOTAL) BY MOUTH DAILY BEFORE BREAKFAST. 02/10/19   Grayce Sessions, NP  glucose blood (ACCU-CHEK AVIVA PLUS) test strip Use as instructed 01/02/18   Loletta Specter, PA-C  HYDROcodone-acetaminophen (NORCO/VICODIN) 5-325 MG tablet 1 po bid prn pain 02/17/19   Cammy Copa, MD  insulin aspart (NOVOLOG) 100 UNIT/ML injection Inject 10 Units into the skin 3 (three) times daily before meals. 01/10/19   Grayce Sessions, NP  Insulin Detemir (LEVEMIR FLEXTOUCH) 100 UNIT/ML Pen INJECT 50 UNITS INTO THE SKIN 2 (TWO) TIMES DAILY. 01/10/19   Grayce Sessions, NP  Lancets (ACCU-CHEK SOFT TOUCH) lancets Use as instructed 08/01/18   Grayce Sessions, NP  metFORMIN (GLUCOPHAGE) 1000 MG tablet Take 1 tablet (1,000 mg total) by mouth 2 (two) times  daily with a meal. 01/10/19   Grayce SessionsEdwards, Michelle P, NP  potassium chloride (K-DUR) 10 MEQ tablet TAKE 2 TABLETS (20 MEQ TOTAL) BY MOUTH DAILY. 12/10/18   Butch PennyMillikan, Megan, NP  pravastatin (PRAVACHOL) 10 MG tablet TAKE 1 TABLET (10 MG TOTAL) BY MOUTH DAILY. 10/29/18   Grayce SessionsEdwards, Michelle P, NP  PROVENTIL HFA 108 (90 Base) MCG/ACT inhaler INHALE 1 TO 2 PUFFS BY MOUTH EVERY 6 (SIX) HOURS AS NEEDED FOR WHEEZING OR SHORTNESS OF BREATH. 01/10/19   Grayce SessionsEdwards, Michelle P, NP  traMADol (ULTRAM) 50 MG tablet TAKE 1 TABLET (50 MG TOTAL) BY MOUTH EVERY 12 (TWELVE) HOURS AS NEEDED. 02/03/19   Magnant, Joycie Peekharles L, PA-C    Family History Family History  Problem Relation Age of Onset  . Diabetes Mother   . Hypertension Mother   . Renal Disease Mother   . Cancer Father     Social History Social History   Tobacco Use  . Smoking status: Current  Every Day Smoker    Packs/day: 0.24    Types: Cigarettes  . Smokeless tobacco: Never Used  Substance Use Topics  . Alcohol use: No  . Drug use: No     Allergies   Lisinopril   Review of Systems Review of Systems   Physical Exam Triage Vital Signs ED Triage Vitals  Enc Vitals Group     BP 03/08/19 1722 (!) 160/72     Pulse Rate 03/08/19 1722 79     Resp 03/08/19 1722 18     Temp 03/08/19 1722 98.2 F (36.8 C)     Temp Source 03/08/19 1722 Oral     SpO2 03/08/19 1722 100 %     Weight --      Height --      Head Circumference --      Peak Flow --      Pain Score 03/08/19 1731 0     Pain Loc --      Pain Edu? --      Excl. in GC? --    No data found.  Updated Vital Signs BP (!) 160/72 (BP Location: Left Arm)   Pulse 79   Temp 98.2 F (36.8 C) (Oral)   Resp 18   SpO2 100%   Visual Acuity Right Eye Distance:   Left Eye Distance:   Bilateral Distance:    Right Eye Near:   Left Eye Near:    Bilateral Near:     Physical Exam   UC Treatments / Results  Labs (all labs ordered are listed, but only abnormal results are displayed) Labs Reviewed  NOVEL CORONAVIRUS, NAA (HOSP ORDER, SEND-OUT TO REF LAB; TAT 18-24 HRS)    EKG   Radiology No results found.  Procedures Procedures (including critical care time)  Medications Ordered in UC Medications - No data to display  Initial Impression / Assessment and Plan / UC Course  I have reviewed the triage vital signs and the nursing notes.  Pertinent labs & imaging results that were available during my care of the patient were reviewed by me and considered in my medical decision making (see chart for details).     After patient was triaged by nursing staff and had Covid swab obtained, patient requested to leave as she has been waiting and did not wish to be seen by a provider.  Given patient did not have any exposures or any symptoms at the time and VSS, provided general recommendations and guidelines  regarding Covid.  Patient was discharged  by nursing staff with this information.  She was never evaluated by myself.   Final Clinical Impressions(s) / UC Diagnoses   Final diagnoses:  Exposure to COVID-19 virus     Discharge Instructions        Person Under Monitoring Name: [name]  Location: [place] [address]   Infection Prevention Recommendations for Individuals Confirmed to have, or Being Evaluated for, 2019 Novel Coronavirus (COVID-19) Infection Who Receive Care at Home  Individuals who are confirmed to have, or are being evaluated for, COVID-19 should follow the prevention steps below until a healthcare provider or local or state health department says they can return to normal activities.  Stay home except to get medical care You should restrict activities outside your home, except for getting medical care. Do not go to work, school, or public areas, and do not use public transportation or taxis.  Call ahead before visiting your doctor Before your medical appointment, call the healthcare provider and tell them that you have, or are being evaluated for, COVID-19 infection. This will help the healthcare provider's office take steps to keep other people from getting infected. Ask your healthcare provider to call the local or state health department.  Monitor your symptoms Seek prompt medical attention if your illness is worsening (e.g., difficulty breathing). Before going to your medical appointment, call the healthcare provider and tell them that you have, or are being evaluated for, COVID-19 infection. Ask your healthcare provider to call the local or state health department.  Wear a facemask You should wear a facemask that covers your nose and mouth when you are in the same room with other people and when you visit a healthcare provider. People who live with or visit you should also wear a facemask while they are in the same room with you.   Separate yourself from other people in your home As much as possible, you should stay in a different room from other people in your home. Also, you should use a separate bathroom, if available.  Avoid sharing household items You should not share dishes, drinking glasses, cups, eating utensils, towels, bedding, or other items with other people in your home. After using these items, you should wash them thoroughly with soap and water.  Cover your coughs and sneezes Cover your mouth and nose with a tissue when you cough or sneeze, or you can cough or sneeze into your sleeve. Throw used tissues in a lined trash can, and immediately wash your hands with soap and water for at least 20 seconds or use an alcohol-based hand rub.  Wash your [facility] your hands often and thoroughly with soap and water for at least 20 seconds. You can use an alcohol-based hand sanitizer if soap and water are not available and if your hands are not visibly dirty. Avoid touching your eyes, nose, and mouth with unwashed hands.   Prevention Steps for Caregivers and Household Members of Individuals Confirmed to have, or Being Evaluated for, COVID-19 Infection Being Cared for in the Home  If you live with, or provide care at home for, a person confirmed to have, or being evaluated for, COVID-19 infection please follow these guidelines to prevent infection:  Follow healthcare provider's instructions Make sure that you understand and can help the patient follow any healthcare provider instructions for all care.  Provide for the patient's basic needs You should help the patient with basic needs in the home and provide support for getting groceries, prescriptions, and  other personal needs.  Monitor the patient's symptoms If they are getting sicker, call his or her medical provider and tell them that the patient has, or is being evaluated for, COVID-19 infection. This will help the healthcare provider's office take  steps to keep other people from getting infected. Ask the healthcare provider to call the local or state health department.  Limit the number of people who have contact with the patient  If possible, have only one caregiver for the patient.  Other household members should stay in another home or place of residence. If this is not possible, they should stay  in another room, or be separated from the patient as much as possible. Use a separate bathroom, if available.  Restrict visitors who do not have an essential need to be in the home.  Keep older adults, very young children, and other sick people away from the patient Keep older adults, very young children, and those who have compromised immune systems or chronic health conditions away from the patient. This includes people with chronic heart, lung, or kidney conditions, diabetes, and cancer.  Ensure good ventilation Make sure that shared spaces in the home have good air flow, such as from an air conditioner or an opened window, weather permitting.  Wash your hands often  Wash your hands often and thoroughly with soap and water for at least 20 seconds. You can use an alcohol based hand sanitizer if soap and water are not available and if your hands are not visibly dirty.  Avoid touching your eyes, nose, and mouth with unwashed hands.  Use disposable paper towels to dry your hands. If not available, use dedicated cloth towels and replace them when they become wet.  Wear a facemask and gloves  Wear a disposable facemask at all times in the room and gloves when you touch or have contact with the patient's blood, body fluids, and/or secretions or excretions, such as sweat, saliva, sputum, nasal mucus, vomit, urine, or feces.  Ensure the mask fits over your nose and mouth tightly, and do not touch it during use.  Throw out disposable facemasks and gloves after using them. Do not reuse.  Wash your hands immediately after removing your  facemask and gloves.  If your personal clothing becomes contaminated, carefully remove clothing and launder. Wash your hands after handling contaminated clothing.  Place all used disposable facemasks, gloves, and other waste in a lined container before disposing them with other household waste.  Remove gloves and wash your hands immediately after handling these items.  Do not share dishes, glasses, or other household items with the patient  Avoid sharing household items. You should not share dishes, drinking glasses, cups, eating utensils, towels, bedding, or other items with a patient who is confirmed to have, or being evaluated for, COVID-19 infection.  After the person uses these items, you should wash them thoroughly with soap and water.  Wash laundry thoroughly  Immediately remove and wash clothes or bedding that have blood, body fluids, and/or secretions or excretions, such as sweat, saliva, sputum, nasal mucus, vomit, urine, or feces, on them.  Wear gloves when handling laundry from the patient.  Read and follow directions on labels of laundry or clothing items and detergent. In general, wash and dry with the warmest temperatures recommended on the label.  Clean all areas the individual has used often  Clean all touchable surfaces, such as counters, tabletops, doorknobs, bathroom fixtures, toilets, phones, keyboards, tablets, and bedside tables, every day. Also,  clean any surfaces that may have blood, body fluids, and/or secretions or excretions on them.  Wear gloves when cleaning surfaces the patient has come in contact with.  Use a diluted bleach solution (e.g., dilute bleach with 1 part bleach and 10 parts water) or a household disinfectant with a label that says EPA-registered for coronaviruses. To make a bleach solution at home, add 1 tablespoon of bleach to 1 quart (4 cups) of water. For a larger supply, add  cup of bleach to 1 gallon (16 cups) of water.  Read labels of  cleaning products and follow recommendations provided on product labels. Labels contain instructions for safe and effective use of the cleaning product including precautions you should take when applying the product, such as wearing gloves or eye protection and making sure you have good ventilation during use of the product.  Remove gloves and wash hands immediately after cleaning.  Monitor yourself for signs and symptoms of illness Caregivers and household members are considered close contacts, should monitor their health, and will be asked to limit movement outside of the home to the extent possible. Follow the monitoring steps for close contacts listed on the symptom monitoring form.   ? If you have additional questions, contact your local health department or call the epidemiologist on call at [phone] (available 24/7). ? This guidance is subject to change. For the most up-to-date guidance from [facility], please refer to their website: [employer]Met[employer].hu    ED Prescriptions    None     PDMP not reviewed this encounter.   Lew Dawes, PA-C 03/09/19 1014

## 2019-03-10 LAB — NOVEL CORONAVIRUS, NAA (HOSP ORDER, SEND-OUT TO REF LAB; TAT 18-24 HRS): SARS-CoV-2, NAA: NOT DETECTED

## 2019-03-11 ENCOUNTER — Telehealth (INDEPENDENT_AMBULATORY_CARE_PROVIDER_SITE_OTHER): Payer: Medicaid Other | Admitting: Primary Care

## 2019-03-11 DIAGNOSIS — R05 Cough: Secondary | ICD-10-CM

## 2019-03-11 DIAGNOSIS — M79671 Pain in right foot: Secondary | ICD-10-CM

## 2019-03-11 DIAGNOSIS — G8929 Other chronic pain: Secondary | ICD-10-CM

## 2019-03-11 DIAGNOSIS — F17200 Nicotine dependence, unspecified, uncomplicated: Secondary | ICD-10-CM

## 2019-03-11 DIAGNOSIS — M79672 Pain in left foot: Secondary | ICD-10-CM | POA: Diagnosis not present

## 2019-03-11 DIAGNOSIS — R059 Cough, unspecified: Secondary | ICD-10-CM

## 2019-03-11 NOTE — Progress Notes (Signed)
Virtual Visit via Telephone Note  I connected with [name] on 03/11/19 at  3:10 PM EST by telephone and verified that I am speaking with the correct person using two identifiers.   I discussed the limitations, risks, security and privacy concerns of performing an evaluation and management service by telephone and the availability of in person appointments. I also discussed with the patient that there may be a patient responsible charge related to this service. The patient expressed understanding and agreed to proceed.   History of Present Illness: [name] is having a tele visit for complaints of foot pain and back pain rates her pain 7/10. She complaints of cough, cold and congestion.   Past Medical History:  Diagnosis Date  . Anxiety   . Common migraine with intractable migraine 10/02/2016  . Depression   . Diabetes mellitus   . Hypertension      Current Outpatient Medications on File Prior to Visit  Medication Sig Dispense Refill  . acetaZOLAMIDE (DIAMOX) 500 MG capsule TAKE ONE CAPSULE IN THE MORNING AND 2 CAPSULES IN THE EVENING    . aspirin EC 81 MG tablet Take 81 mg by mouth every 6 (six) hours as needed for mild pain.     . benzonatate (TESSALON) 100 MG capsule Take 1 capsule (100 mg total) by mouth 2 (two) times daily as needed for cough. 20 capsule 0  . EASY COMFORT PEN NEEDLES 31G X 5 MM MISC USE AS INSTRUCTED. TWICE DAILY. 100 each 1  . escitalopram (LEXAPRO) 20 MG tablet TAKE 1 TABLET (20 MG TOTAL) BY MOUTH DAILY. 30 tablet 3  . fluticasone (FLOVENT HFA) 44 MCG/ACT inhaler Inhale 2 puffs into the lungs 2 (two) times daily. 1 Inhaler 12  . furosemide (LASIX) 20 MG tablet TAKE 2 TABLETS DAILY 60 tablet 0  . glimepiride (AMARYL) 4 MG tablet TAKE 1 TABLET (4 MG TOTAL) BY MOUTH DAILY BEFORE BREAKFAST. 90 tablet 0  . glucose blood (ACCU-CHEK AVIVA PLUS) test strip Use as instructed 100 each 12  . HYDROcodone-acetaminophen (NORCO/VICODIN) 5-325 MG tablet 1 po bid  prn pain 20 tablet 0  . insulin aspart (NOVOLOG) 100 UNIT/ML injection Inject 10 Units into the skin 3 (three) times daily before meals. 10 mL 3  . Insulin Detemir (LEVEMIR FLEXTOUCH) 100 UNIT/ML Pen INJECT 50 UNITS INTO THE SKIN 2 (TWO) TIMES DAILY. 30 mL 3  . Lancets (ACCU-CHEK SOFT TOUCH) lancets Use as instructed 100 each 12  . metFORMIN (GLUCOPHAGE) 1000 MG tablet TAKE 1 TABLET (1,000 MG TOTAL) BY MOUTH 2 (TWO) TIMES DAILY WITH A MEAL. 180 tablet 0  . potassium chloride (K-DUR) 10 MEQ tablet TAKE 2 TABLETS (20 MEQ TOTAL) BY MOUTH DAILY. 60 tablet 1  . pravastatin (PRAVACHOL) 10 MG tablet TAKE 1 TABLET (10 MG TOTAL) BY MOUTH DAILY. 30 tablet 3  . PROVENTIL HFA 108 (90 Base) MCG/ACT inhaler INHALE 1 TO 2 PUFFS BY MOUTH EVERY 6 (SIX) HOURS AS NEEDED FOR WHEEZING OR SHORTNESS OF BREATH. 6.7 g 1   No current facility-administered medications on file prior to visit.    Observations/Objective: [name] was seen today for foot pain.  Diagnoses and all orders for this visit:  Chronic pain of both feet She has low back pain, obesity which can be associated with diabetes and obesity. She is followed by ortho will have patient to discussed back pain. Recommend loosing weight.  Cough Upper airway cough syndrome  which is caused by variety of rhinosinus conditions;  asthma;  GERD;  chronic bronchitis from cigarette smoking or other inhaled environmental irritants. Will prescribed guaifenesin.    Tobacco dependence She is aware of the increased risk for lung cancer and other respiratory diseases recommend cessation at each visit.   Follow Up Instructions:    I discussed the assessment and treatment plan with the patient. The patient was provided an opportunity to ask questions and all were answered. The patient agreed with the plan and demonstrated an understanding of the instructions.   The patient was advised to call back or seek an in-person evaluation if the symptoms worsen or if the condition  fails to improve as anticipated.  I provided 18 minutes of non-face-to-face time during this encounter.   Grayce Sessions, NP

## 2019-03-17 ENCOUNTER — Other Ambulatory Visit (INDEPENDENT_AMBULATORY_CARE_PROVIDER_SITE_OTHER): Payer: Self-pay | Admitting: Primary Care

## 2019-03-17 ENCOUNTER — Telehealth: Payer: Self-pay

## 2019-03-17 MED ORDER — DM-GUAIFENESIN ER 30-600 MG PO TB12: 1.0000 | ORAL_TABLET | Freq: Two times a day (BID) | ORAL | 1 refills | Status: DC

## 2019-03-17 NOTE — Telephone Encounter (Signed)
Patient called to request a medication be sent to [facility] for her sinuses. She is very congested and its "messing" with her head. Please send if appropriate. Nat Christen, CMA

## 2019-03-18 ENCOUNTER — Telehealth: Payer: Self-pay | Admitting: Orthopedic Surgery

## 2019-03-18 ENCOUNTER — Other Ambulatory Visit: Payer: Self-pay | Admitting: Surgical

## 2019-03-18 MED ORDER — TRAMADOL HCL 50 MG PO TABS: 50.0000 mg | ORAL_TABLET | Freq: Two times a day (BID) | ORAL | 0 refills | Status: DC | PRN

## 2019-03-18 NOTE — Telephone Encounter (Signed)
Patient called advised her legs are hurting her so bad. Patient asked if she can get something called in for pain? The number to contact patient is [phone]

## 2019-03-18 NOTE — Telephone Encounter (Signed)
Sent in guaifenesin recently tested for COVID results not available

## 2019-03-18 NOTE — Telephone Encounter (Signed)
Please advise. Thanks.  

## 2019-03-19 ENCOUNTER — Telehealth: Payer: Self-pay | Admitting: Orthopedic Surgery

## 2019-03-19 NOTE — Telephone Encounter (Signed)
Patient called concerning Rx. Patient said the Tramadol does not work and asked if she can get the Tylenol 3 again. The number to contact patient is [phone]

## 2019-03-19 NOTE — Telephone Encounter (Signed)
Please advise. Thanks.  

## 2019-03-21 ENCOUNTER — Other Ambulatory Visit: Payer: Self-pay | Admitting: Surgical

## 2019-03-21 MED ORDER — ACETAMINOPHEN-CODEINE #3 300-30 MG PO TABS: 1.0000 | ORAL_TABLET | Freq: Every day | ORAL | 0 refills | Status: DC | PRN

## 2019-03-21 NOTE — Telephone Encounter (Signed)
I will RX Tylenol 3 but this is a one time prescription

## 2019-03-28 ENCOUNTER — Ambulatory Visit
Admission: RE | Admit: 2019-03-28 | Discharge: 2019-03-28 | Disposition: A | Discharge status: Home / Self Care | Payer: Medicaid Other | Source: Ambulatory Visit | Attending: Primary Care | Admitting: Primary Care

## 2019-03-28 ENCOUNTER — Other Ambulatory Visit: Payer: Self-pay

## 2019-03-28 DIAGNOSIS — Z1231 Encounter for screening mammogram for malignant neoplasm of breast: Secondary | ICD-10-CM

## 2019-03-31 ENCOUNTER — Telehealth: Payer: Self-pay

## 2019-03-31 ENCOUNTER — Other Ambulatory Visit: Payer: Self-pay | Admitting: Surgical

## 2019-03-31 ENCOUNTER — Ambulatory Visit (INDEPENDENT_AMBULATORY_CARE_PROVIDER_SITE_OTHER): Payer: Medicaid Other | Admitting: Orthopedic Surgery

## 2019-03-31 ENCOUNTER — Other Ambulatory Visit: Payer: Self-pay

## 2019-03-31 DIAGNOSIS — M79604 Pain in right leg: Secondary | ICD-10-CM

## 2019-03-31 DIAGNOSIS — M79605 Pain in left leg: Secondary | ICD-10-CM | POA: Diagnosis not present

## 2019-03-31 MED ORDER — HYDROCODONE-ACETAMINOPHEN 5-325 MG PO TABS: 1.0000 | ORAL_TABLET | Freq: Every day | ORAL | 0 refills | Status: DC | PRN

## 2019-03-31 NOTE — Telephone Encounter (Signed)
Dr Marlou Sa saw patient today and said she could have [employer]. Can you please submit this to [facility] for her?

## 2019-04-01 ENCOUNTER — Other Ambulatory Visit: Payer: Self-pay | Admitting: Primary Care

## 2019-04-01 ENCOUNTER — Encounter: Payer: Self-pay | Admitting: Orthopedic Surgery

## 2019-04-01 DIAGNOSIS — R928 Other abnormal and inconclusive findings on diagnostic imaging of breast: Secondary | ICD-10-CM

## 2019-04-01 NOTE — Progress Notes (Signed)
Office Visit Note   Patient: [name]           Date of Birth: 11/04/1970           MRN: [id number] Visit Date: 03/31/2019 Requested by: [name] Sessions, NP [address] [place],  [place] [address] PCP: [name] Sessions, NP  Subjective: Chief Complaint  Patient presents with  . Left Leg - Pain  . Right Leg - Pain    HPI: [name] is a patient with bilateral leg pain.  She states her pain is getting much worse.  She reports radicular pain up and down the legs with occasional groin pain and numbness and tingling as well as low back pain.  She reports being in constant pain.  She is concerned about having neuropathy.  She has a brother with neuropathy.She has almost fallen in the mall on 2 occasions.  She has MRI scan of her lumbar spine scheduled for the day after Christmas.  She denies any red flag symptoms.  No saddle paresthesias no fevers or chills.  Her back pain is present but really gets her leg pain which is most symptomatic.  She does report some decreased walking endurance.              ROS: All systems reviewed are negative as they relate to the chief complaint within the history of present illness.  Patient denies  fevers or chills.   Assessment & Plan: Visit Diagnoses:  1. Bilateral leg pain     Plan: Impression is bilateral leg pain with known right knee arthritis and valgus alignment.  Patient has some symptoms suggestive of neuropathy some symptoms suggestive of radiculopathy.  Plan is refill last prescription of pain medicine.  Pedal pulses are palpable.  Needs nerve study to evaluate for neuropathy.  MRI scan to evaluate for radiculopathy.  We will see her back after the studies have been done.  Follow-Up Instructions: Return for after MRI.   Orders:  Orders Placed This Encounter  Procedures  . Ambulatory referral to Physical Medicine Rehab   No orders of the defined types were placed in this encounter.     Procedures: No procedures  performed   Clinical Data: No additional findings.  Objective: Vital Signs: There were no vitals taken for this visit.  Physical Exam:   Constitutional: Patient appears well-developed HEENT:  Head: Normocephalic Eyes:EOM are normal Neck: Normal range of motion Cardiovascular: Normal rate Pulmonary/chest: Effort normal Neurologic: Patient is alert Skin: Skin is warm Psychiatric: Patient has normal mood and affect    Ortho Exam: Ortho exam demonstrates valgus alignment right leg.  Left leg has relatively normal alignment.  Patient has palpable pedal pulses and some paresthesias in a nonanatomic distribution on the lateral aspect of both legs below the knee.  Compartments are soft.  Ankle dorsiflexion plantarflexion quad hamstring strength 5+ out of 5 bilaterally with no groin pain with internal X rotation of either leg.  Patient does have knee pain and crepitus more on the right than the left.  Specialty Comments:  No specialty comments available.  Imaging: No results found.   PMFS History: Patient Active Problem List   Diagnosis Date Noted  . Hot flashes 10/02/2018  . Vaginal dryness 10/02/2018  . BIH (benign intracranial hypertension) 11/06/2017  . Somnolence, daytime 07/30/2017  . Snoring 07/30/2017  . Morbid obesity (HCC) 07/30/2017  . Migraine 10/02/2016  . Common migraine with intractable migraine 10/02/2016  . Type 2 diabetes mellitus without complication, without long-term  current use of insulin ([facility]) 08/17/2016   Past Medical History:  Diagnosis Date  . Anxiety   . Common migraine with intractable migraine 10/02/2016  . Depression   . Diabetes mellitus   . Hypertension     Family History  Problem Relation Age of Onset  . Diabetes Mother   . Hypertension Mother   . Renal Disease Mother   . Cancer Father     Past Surgical History:  Procedure Laterality Date  . BTL     Social History   Occupational History  . Not on file  Tobacco Use  . Smoking  status: Current Every Day Smoker    Packs/day: 0.24    Types: Cigarettes  . Smokeless tobacco: Never Used  Substance and Sexual Activity  . Alcohol use: No  . Drug use: No  . Sexual activity: Yes    Birth control/protection: None

## 2019-04-05 ENCOUNTER — Other Ambulatory Visit: Payer: Medicaid Other

## 2019-04-08 ENCOUNTER — Other Ambulatory Visit: Payer: Self-pay

## 2019-04-08 DIAGNOSIS — M79604 Pain in right leg: Secondary | ICD-10-CM

## 2019-04-09 ENCOUNTER — Telehealth: Payer: Self-pay | Admitting: Orthopedic Surgery

## 2019-04-09 ENCOUNTER — Ambulatory Visit
Admission: RE | Admit: 2019-04-09 | Discharge: 2019-04-09 | Disposition: A | Discharge status: Home / Self Care | Payer: Medicaid Other | Source: Ambulatory Visit | Attending: Primary Care | Admitting: Primary Care

## 2019-04-09 ENCOUNTER — Other Ambulatory Visit: Payer: Self-pay

## 2019-04-09 ENCOUNTER — Other Ambulatory Visit: Payer: Self-pay | Admitting: Primary Care

## 2019-04-09 DIAGNOSIS — N6310 Unspecified lump in the right breast, unspecified quadrant: Secondary | ICD-10-CM

## 2019-04-09 DIAGNOSIS — R928 Other abnormal and inconclusive findings on diagnostic imaging of breast: Secondary | ICD-10-CM

## 2019-04-09 NOTE — Telephone Encounter (Signed)
Please advise if ok to rf? 

## 2019-04-09 NOTE — Telephone Encounter (Signed)
Patient called needing Rx refilled Vicodin. Patient said she is out of her medication. The number to contact patient is [phone]

## 2019-04-09 NOTE — Telephone Encounter (Signed)
No rf, deans last note said last rx was that one from last visit

## 2019-04-09 NOTE — Telephone Encounter (Signed)
IC s/w patient and advised per Junie Panning. She verbalized understanding.

## 2019-04-10 ENCOUNTER — Telehealth: Payer: Self-pay | Admitting: Orthopedic Surgery

## 2019-04-10 NOTE — Telephone Encounter (Signed)
Tried calling patient. No answer. LMVM advising per our conversation yesterday unable to rf hydrocodone.  Also advised that she has a referral pending for EMG/NCV for bilateral LE

## 2019-04-10 NOTE — Telephone Encounter (Signed)
Patient called requesting a refill on hydrocodone.Please call in refill to [facility]. Patient states she would also like a call back from Dr. Randel Pigg nurse. Patient phone number is [phone].

## 2019-04-15 ENCOUNTER — Telehealth: Payer: Self-pay | Admitting: Orthopedic Surgery

## 2019-04-15 NOTE — Telephone Encounter (Signed)
Can you help with this? Patient needs MRI scan of lumbar for bilateral leg pain-Dr August Saucer feels the leg pain is coming from her back.

## 2019-04-15 NOTE — Telephone Encounter (Signed)
[name] from [facility] called. She needs the diagnosis changed for the patient. The MRI is for the lower back but it was ordered for the knee. The MRI is scheduled for Friday.   Call back number: [phone] extension [phone]

## 2019-04-16 ENCOUNTER — Other Ambulatory Visit: Payer: Self-pay | Admitting: Orthopedic Surgery

## 2019-04-16 ENCOUNTER — Other Ambulatory Visit: Payer: Self-pay

## 2019-04-16 DIAGNOSIS — M79604 Pain in right leg: Secondary | ICD-10-CM

## 2019-04-16 DIAGNOSIS — M5441 Lumbago with sciatica, right side: Secondary | ICD-10-CM

## 2019-04-16 DIAGNOSIS — G8929 Other chronic pain: Secondary | ICD-10-CM

## 2019-04-16 NOTE — Telephone Encounter (Signed)
I changed the dx but also left the other dx and see that helps.

## 2019-04-18 ENCOUNTER — Other Ambulatory Visit: Payer: Medicaid Other

## 2019-04-19 ENCOUNTER — Ambulatory Visit
Admission: RE | Admit: 2019-04-19 | Discharge: 2019-04-19 | Disposition: A | Discharge status: Home / Self Care | Payer: Medicaid Other | Source: Ambulatory Visit | Attending: Orthopedic Surgery | Admitting: Orthopedic Surgery

## 2019-04-19 ENCOUNTER — Other Ambulatory Visit: Payer: Self-pay

## 2019-04-19 DIAGNOSIS — M5442 Lumbago with sciatica, left side: Secondary | ICD-10-CM

## 2019-04-19 DIAGNOSIS — M5441 Lumbago with sciatica, right side: Secondary | ICD-10-CM

## 2019-04-19 DIAGNOSIS — G8929 Other chronic pain: Secondary | ICD-10-CM

## 2019-04-21 ENCOUNTER — Ambulatory Visit (INDEPENDENT_AMBULATORY_CARE_PROVIDER_SITE_OTHER): Payer: Medicaid Other | Admitting: Orthopedic Surgery

## 2019-04-21 ENCOUNTER — Other Ambulatory Visit: Payer: Self-pay

## 2019-04-21 ENCOUNTER — Encounter: Payer: Self-pay | Admitting: Orthopedic Surgery

## 2019-04-21 DIAGNOSIS — M79605 Pain in left leg: Secondary | ICD-10-CM

## 2019-04-21 DIAGNOSIS — M79604 Pain in right leg: Secondary | ICD-10-CM | POA: Diagnosis not present

## 2019-04-21 MED ORDER — HYDROCODONE-ACETAMINOPHEN 5-325 MG PO TABS: ORAL_TABLET | ORAL | 0 refills | Status: DC

## 2019-04-22 ENCOUNTER — Other Ambulatory Visit: Payer: Medicaid Other

## 2019-04-27 ENCOUNTER — Encounter: Payer: Self-pay | Admitting: Orthopedic Surgery

## 2019-04-27 NOTE — Progress Notes (Signed)
Office Visit Note   Patient: [name]           Date of Birth: [birth year]           MRN: [id number] Visit Date: 04/21/2019 Requested by: [name] Sessions, NP [address] [place],  [place] [address] PCP: [name] Sessions, NP  Subjective: Chief Complaint  Patient presents with  . Follow-up    HPI: [name] is a patient with low back pain and bilateral leg pain.  Since have seen her she has had an MRI of the lumbar spine which does show L4-5 facet arthritis right worse than left.  She also is interested in continuing to take pain medicine.              ROS: All systems reviewed are negative as they relate to the chief complaint within the history of present illness.  Patient denies  fevers or chills.   Assessment & Plan: Visit Diagnoses:  1. Bilateral leg pain     Plan: Impression is bilateral leg pain and pretty severe end-stage right knee arthritis.  Plan is refer to pain management and referred to Dr. Berlin [name] for evaluation for [facility] Norco prescription refill x1 with no further refills.  Did tell her that we need to start transitioning her away from chronic narcotic pain medicine for this problem.  I will see her back as needed.  If she needs surgical evaluation I will leave it up to Dr. Alvester [name] to refer her for that issue.  Follow-Up Instructions: No follow-ups on file.   Orders:  Orders Placed This Encounter  Procedures  . Referral to Dr. Alvester [name] - Spine Injection  . Ambulatory referral to Pain Clinic   Meds ordered this encounter  Medications  . HYDROcodone-acetaminophen (NORCO/VICODIN) 5-325 MG tablet    Sig: 1 po q 12hrs prn pain    Dispense:  36 tablet    Refill:  0      Procedures: No procedures performed   Clinical Data: No additional findings.  Objective: Vital Signs: There were no vitals taken for this visit.  Physical Exam:   Constitutional: Patient appears well-developed HEENT:  Head: Normocephalic Eyes:EOM are normal Neck: Normal  range of motion Cardiovascular: Normal rate Pulmonary/chest: Effort normal Neurologic: Patient is alert Skin: Skin is warm Psychiatric: Patient has normal mood and affect    Ortho Exam: Ortho exam demonstrates valgus alignment right leg.  Trace effusion bilateral knees.  No nerve root tension signs.  Does have pain with forward lateral bending.  No definite paresthesias L1 S1 bilaterally pedal pulses palpable strength is good ankle dorsiflexion plantarflexion quad hamstring strength testing.  Specialty Comments:  No specialty comments available.  Imaging: No results found.   PMFS History: Patient Active Problem List   Diagnosis Date Noted  . Hot flashes 10/02/2018  . Vaginal dryness 10/02/2018  . BIH (benign intracranial hypertension) 11/06/2017  . Somnolence, daytime 07/30/2017  . Snoring 07/30/2017  . Morbid obesity (HCC) 07/30/2017  . Migraine 10/02/2016  . Common migraine with intractable migraine 10/02/2016  . Type 2 diabetes mellitus without complication, without long-term current use of insulin (HCC) 08/17/2016   Past Medical History:  Diagnosis Date  . Anxiety   . Common migraine with intractable migraine 10/02/2016  . Depression   . Diabetes mellitus   . Hypertension     Family History  Problem Relation Age of Onset  . Diabetes Mother   . Hypertension Mother   . Renal Disease Mother   . Cancer  Father     Past Surgical History:  Procedure Laterality Date  . BTL     Social History   Occupational History  . Not on file  Tobacco Use  . Smoking status: Current Every Day Smoker    Packs/day: 0.24    Types: Cigarettes  . Smokeless tobacco: Never Used  Substance and Sexual Activity  . Alcohol use: No  . Drug use: No  . Sexual activity: Yes    Birth control/protection: None

## 2019-04-30 ENCOUNTER — Encounter: Payer: Medicaid Other | Admitting: Neurology

## 2019-05-02 ENCOUNTER — Telehealth (INDEPENDENT_AMBULATORY_CARE_PROVIDER_SITE_OTHER): Payer: Self-pay

## 2019-05-02 NOTE — Telephone Encounter (Signed)
Patient called to make a medication refill for   omeprazole (PRILOSEC) 20 MG capsule  States her acid reflux has returned.  Patient uses  [facility]  Please advice [phone]

## 2019-05-02 NOTE — Telephone Encounter (Signed)
FWD to PCP

## 2019-05-03 NOTE — Telephone Encounter (Signed)
Not on omeprazole if having  she can try eating small frequent meal, reduction in acidic foods, fried foods ,spicy foods, alcohol caffeine and tobacco and certain medications. Avoid laying down after eating 15mins-1hour, elevated head of the bed. Purchase omperazole is over the counter

## 2019-05-08 ENCOUNTER — Ambulatory Visit (INDEPENDENT_AMBULATORY_CARE_PROVIDER_SITE_OTHER): Payer: Medicaid Other | Admitting: Primary Care

## 2019-05-08 ENCOUNTER — Other Ambulatory Visit: Payer: Self-pay

## 2019-05-08 ENCOUNTER — Encounter (INDEPENDENT_AMBULATORY_CARE_PROVIDER_SITE_OTHER): Payer: Self-pay | Admitting: Primary Care

## 2019-05-08 DIAGNOSIS — K219 Gastro-esophageal reflux disease without esophagitis: Secondary | ICD-10-CM

## 2019-05-08 MED ORDER — PANTOPRAZOLE SODIUM 40 MG PO TBEC: 40.0000 mg | DELAYED_RELEASE_TABLET | Freq: Every day | ORAL | 3 refills | Status: DC

## 2019-05-08 NOTE — Progress Notes (Signed)
Pt has burning when she eats She feels like gassy acid  She feels it in her throat  Pt would like Rx for omeprazole

## 2019-05-08 NOTE — Telephone Encounter (Signed)
Patient scheduled appt for today to discuss acid reflux.

## 2019-05-08 NOTE — Progress Notes (Signed)
Virtual Visit via Telephone Note  I connected with [name] on 05/08/19 at  4:10 PM EST by telephone and verified that I am speaking with the correct person using two identifiers.   I discussed the limitations, risks, security and privacy concerns of performing an evaluation and management service by telephone and the availability of in person appointments. I also discussed with the patient that there may be a patient responsible charge related to this service. The patient expressed understanding and agreed to proceed.   History of Present Illness: [name] is having a tele visit for acid reflux she denies eating spicy foods, she is having some difficulty drinking milk and icecreame ( lactose intolerance). She denies another complaint or problem   Past Medical History:  Diagnosis Date  . Anxiety   . Common migraine with intractable migraine 10/02/2016  . Depression   . Diabetes mellitus   . Hypertension    Current Outpatient Medications on File Prior to Visit  Medication Sig Dispense Refill  . escitalopram (LEXAPRO) 20 MG tablet TAKE 1 TABLET (20 MG TOTAL) BY MOUTH DAILY. 30 tablet 3  . fluticasone (FLOVENT HFA) 44 MCG/ACT inhaler Inhale 2 puffs into the lungs 2 (two) times daily. 1 Inhaler 12  . furosemide (LASIX) 20 MG tablet TAKE 2 TABLETS DAILY 60 tablet 0  . glimepiride (AMARYL) 4 MG tablet TAKE 1 TABLET (4 MG TOTAL) BY MOUTH DAILY BEFORE BREAKFAST. 90 tablet 0  . glucose blood (ACCU-CHEK AVIVA PLUS) test strip Use as instructed 100 each 12  . insulin aspart (NOVOLOG) 100 UNIT/ML injection Inject 10 Units into the skin 3 (three) times daily before meals. 10 mL 3  . Insulin Detemir (LEVEMIR FLEXTOUCH) 100 UNIT/ML Pen INJECT 50 UNITS INTO THE SKIN 2 (TWO) TIMES DAILY. 30 mL 3  . Lancets (ACCU-CHEK SOFT TOUCH) lancets Use as instructed 100 each 12  . metFORMIN (GLUCOPHAGE) 1000 MG tablet TAKE 1 TABLET (1,000 MG TOTAL) BY MOUTH 2 (TWO) TIMES DAILY WITH A MEAL. 180 tablet 0  .  potassium chloride (K-DUR) 10 MEQ tablet TAKE 2 TABLETS (20 MEQ TOTAL) BY MOUTH DAILY. 60 tablet 1  . pravastatin (PRAVACHOL) 10 MG tablet TAKE 1 TABLET (10 MG TOTAL) BY MOUTH DAILY. 30 tablet 3  . PROVENTIL HFA 108 (90 Base) MCG/ACT inhaler INHALE 1 TO 2 PUFFS BY MOUTH EVERY 6 (SIX) HOURS AS NEEDED FOR WHEEZING OR SHORTNESS OF BREATH. 6.7 g 1  . traMADol (ULTRAM) 50 MG tablet Take 1 tablet (50 mg total) by mouth every 12 (twelve) hours as needed. 30 tablet 0  . aspirin EC 81 MG tablet Take 81 mg by mouth every 6 (six) hours as needed for mild pain.     Marland Kitchen EASY COMFORT PEN NEEDLES 31G X 5 MM MISC USE AS INSTRUCTED. TWICE DAILY. 100 each 1   No current facility-administered medications on file prior to visit.   Observations/Objective: Review of Systems  Gastrointestinal: Positive for abdominal pain and heartburn.  All other systems reviewed and are negative.   Assessment and Plan: Discussed eating small frequent meal, reduction in acidic foods, fried foods ,spicy foods, alcohol caffeine and tobacco and certain medications. Avoid laying down after eating 79mins-1hour, elevated head of the bed. Sent in prescription for protonix 40mg  daily prn.   Follow Up Instructions:    I discussed the assessment and treatment plan with the patient. The patient was provided an opportunity to ask questions and all were answered. The patient agreed with the plan and demonstrated  an understanding of the instructions.   The patient was advised to call back or seek an in-person evaluation if the symptoms worsen or if the condition fails to improve as anticipated.  I provided 6 minutes of non-face-to-face time during this encounter.   Grayce Sessions, NP

## 2019-05-09 ENCOUNTER — Emergency Department (HOSPITAL_COMMUNITY): Payer: Medicaid Other

## 2019-05-09 ENCOUNTER — Emergency Department (HOSPITAL_COMMUNITY)
Admission: EM | Admit: 2019-05-09 | Discharge: 2019-05-10 | Disposition: A | Discharge status: Home / Self Care | Payer: Medicaid Other | Attending: Emergency Medicine | Admitting: Emergency Medicine

## 2019-05-09 ENCOUNTER — Encounter (HOSPITAL_COMMUNITY): Payer: Self-pay | Admitting: Emergency Medicine

## 2019-05-09 ENCOUNTER — Other Ambulatory Visit: Payer: Self-pay

## 2019-05-09 DIAGNOSIS — W01198A Fall on same level from slipping, tripping and stumbling with subsequent striking against other object, initial encounter: Secondary | ICD-10-CM | POA: Insufficient documentation

## 2019-05-09 DIAGNOSIS — Y939 Activity, unspecified: Secondary | ICD-10-CM | POA: Insufficient documentation

## 2019-05-09 DIAGNOSIS — Y92091 Bathroom in other non-institutional residence as the place of occurrence of the external cause: Secondary | ICD-10-CM | POA: Diagnosis not present

## 2019-05-09 DIAGNOSIS — F17210 Nicotine dependence, cigarettes, uncomplicated: Secondary | ICD-10-CM | POA: Insufficient documentation

## 2019-05-09 DIAGNOSIS — I10 Essential (primary) hypertension: Secondary | ICD-10-CM | POA: Insufficient documentation

## 2019-05-09 DIAGNOSIS — Z7982 Long term (current) use of aspirin: Secondary | ICD-10-CM | POA: Insufficient documentation

## 2019-05-09 DIAGNOSIS — Z79899 Other long term (current) drug therapy: Secondary | ICD-10-CM | POA: Diagnosis not present

## 2019-05-09 DIAGNOSIS — Y999 Unspecified external cause status: Secondary | ICD-10-CM | POA: Insufficient documentation

## 2019-05-09 DIAGNOSIS — S8391XA Sprain of unspecified site of right knee, initial encounter: Secondary | ICD-10-CM | POA: Insufficient documentation

## 2019-05-09 DIAGNOSIS — Y92512 Supermarket, store or market as the place of occurrence of the external cause: Secondary | ICD-10-CM | POA: Insufficient documentation

## 2019-05-09 DIAGNOSIS — E119 Type 2 diabetes mellitus without complications: Secondary | ICD-10-CM | POA: Insufficient documentation

## 2019-05-09 DIAGNOSIS — M545 Low back pain: Secondary | ICD-10-CM | POA: Insufficient documentation

## 2019-05-09 DIAGNOSIS — W19XXXA Unspecified fall, initial encounter: Secondary | ICD-10-CM

## 2019-05-09 DIAGNOSIS — Z794 Long term (current) use of insulin: Secondary | ICD-10-CM | POA: Insufficient documentation

## 2019-05-09 DIAGNOSIS — S8991XA Unspecified injury of right lower leg, initial encounter: Secondary | ICD-10-CM | POA: Diagnosis present

## 2019-05-09 DIAGNOSIS — S86911A Strain of unspecified muscle(s) and tendon(s) at lower leg level, right leg, initial encounter: Secondary | ICD-10-CM

## 2019-05-09 MED ORDER — OXYCODONE-ACETAMINOPHEN 5-325 MG PO TABS
1.0000 | ORAL_TABLET | Freq: Once | ORAL | Status: AC
  Administered 2019-05-09: 22:00:00 1 via ORAL
  Filled 2019-05-09: qty 1

## 2019-05-09 NOTE — ED Provider Notes (Signed)
[facility] DEPT Provider Note   CSN: [id number] Arrival date & time: 05/09/19  2002     History Chief Complaint  Patient presents with  . Knee Pain  . Head Injury  . Fall    [name] is a 49 y.o. female.  Presents to the emergency department after fall at [facility].  Patient states she slipped on a fall while using the restroom on wet floor.  Struck head against the sink, fell directly onto her right knee and right hip.  States she is having pain in her right knee, right hip, lower back.  Pain sharp, stabbing, severe.  Has not taken any medicine for this.  Denies any numbness or weakness associated with his injuries.  No chest pain or difficulty in breathing.  No neck pain.  HPI     Past Medical History:  Diagnosis Date  . Anxiety   . Common migraine with intractable migraine 10/02/2016  . Depression   . Diabetes mellitus   . Hypertension     Patient Active Problem List   Diagnosis Date Noted  . Hot flashes 10/02/2018  . Vaginal dryness 10/02/2018  . BIH (benign intracranial hypertension) 11/06/2017  . Somnolence, daytime 07/30/2017  . Snoring 07/30/2017  . Morbid obesity ([facility]) 07/30/2017  . Migraine 10/02/2016  . Common migraine with intractable migraine 10/02/2016  . Type 2 diabetes mellitus without complication, without long-term current use of insulin ([facility]) 08/17/2016    Past Surgical History:  Procedure Laterality Date  . BTL       OB History    Gravida  5   Para  3   Term  3   Preterm      AB  2   Living  3     SAB  1   TAB  1   Ectopic      Multiple      Live Births              Family History  Problem Relation Age of Onset  . Diabetes Mother   . Hypertension Mother   . Renal Disease Mother   . Cancer Father     Social History   Tobacco Use  . Smoking status: Current Every Day Smoker    Packs/day: 0.24    Types: Cigarettes  . Smokeless tobacco: Never Used  Substance Use Topics    . Alcohol use: No  . Drug use: No    Home Medications Prior to Admission medications   Medication Sig Start Date End Date Taking? Authorizing Provider  aspirin EC 81 MG tablet Take 81 mg by mouth every 6 (six) hours as needed for mild pain.    Yes [provider]  escitalopram (LEXAPRO) 20 MG tablet TAKE 1 TABLET (20 MG TOTAL) BY MOUTH DAILY. 12/10/18  Yes Kerin Perna, NP  fluticasone (FLOVENT HFA) 44 MCG/ACT inhaler Inhale 2 puffs into the lungs 2 (two) times daily. 01/19/19  Yes Kerin Perna, NP  furosemide (LASIX) 20 MG tablet TAKE 2 TABLETS DAILY Patient taking differently: Take 40 mg by mouth every evening. TAKE 2 TABLETS DAILY 03/10/19  Yes Millikan, Megan, NP  glimepiride (AMARYL) 4 MG tablet TAKE 1 TABLET (4 MG TOTAL) BY MOUTH DAILY BEFORE BREAKFAST. 02/10/19  Yes Kerin Perna, NP  insulin aspart (NOVOLOG) 100 UNIT/ML injection Inject 10 Units into the skin 3 (three) times daily before meals. 01/10/19  Yes Kerin Perna, NP  Insulin Detemir (LEVEMIR FLEXTOUCH) 100  UNIT/ML Pen INJECT 50 UNITS INTO THE SKIN 2 (TWO) TIMES DAILY. 01/10/19  Yes Grayce Sessions, NP  metFORMIN (GLUCOPHAGE) 1000 MG tablet TAKE 1 TABLET (1,000 MG TOTAL) BY MOUTH 2 (TWO) TIMES DAILY WITH A MEAL. 03/10/19  Yes Grayce Sessions, NP  pantoprazole (PROTONIX) 40 MG tablet Take 1 tablet (40 mg total) by mouth daily. 05/08/19  Yes Grayce Sessions, NP  potassium chloride (K-DUR) 10 MEQ tablet TAKE 2 TABLETS (20 MEQ TOTAL) BY MOUTH DAILY. 12/10/18  Yes Butch Penny, NP  pravastatin (PRAVACHOL) 10 MG tablet TAKE 1 TABLET (10 MG TOTAL) BY MOUTH DAILY. 10/29/18  Yes Edwards, Kinnie Scales, NP  PROVENTIL HFA 108 (90 Base) MCG/ACT inhaler INHALE 1 TO 2 PUFFS BY MOUTH EVERY 6 (SIX) HOURS AS NEEDED FOR WHEEZING OR SHORTNESS OF BREATH. 01/10/19  Yes Grayce Sessions, NP  traMADol (ULTRAM) 50 MG tablet Take 1 tablet (50 mg total) by mouth every 12 (twelve) hours as needed. Patient taking  differently: Take 50 mg by mouth every 12 (twelve) hours as needed for moderate pain.  03/18/19  Yes Magnant, Charles L, PA-C  EASY COMFORT PEN NEEDLES 31G X 5 MM MISC USE AS INSTRUCTED. TWICE DAILY. 11/08/18   Grayce Sessions, NP  glucose blood (ACCU-CHEK AVIVA PLUS) test strip Use as instructed 01/02/18   Loletta Specter, PA-C  Lancets [facility] SOFT [facility]) lancets Use as instructed 08/01/18   Grayce Sessions, NP  pregabalin (LYRICA) 25 MG capsule Take 25 mg by mouth 2 (two) times daily as needed. 05/08/19   [provider]    Allergies    Lisinopril  Review of Systems   Review of Systems  Constitutional: Negative for chills and fever.  HENT: Negative for ear pain and sore throat.   Eyes: Negative for pain and visual disturbance.  Respiratory: Negative for cough and shortness of breath.   Cardiovascular: Negative for chest pain and palpitations.  Gastrointestinal: Negative for abdominal pain and vomiting.  Genitourinary: Negative for dysuria and hematuria.  Musculoskeletal: Negative for arthralgias and back pain.  Skin: Negative for color change and rash.  Neurological: Negative for seizures and syncope.  All other systems reviewed and are negative.   Physical Exam Updated Vital Signs BP (!) 153/91   Pulse 80   Temp 98.1 F (36.7 C) (Oral)   Resp (!) 21   Ht 5' 5.5" (1.664 m)   Wt 113.4 kg   SpO2 100%   BMI 40.97 kg/m   Physical Exam Vitals and nursing note reviewed.  Constitutional:      General: She is not in acute distress.    Appearance: She is well-developed.  HENT:     Head: Normocephalic and atraumatic.  Eyes:     Conjunctiva/sclera: Conjunctivae normal.  Cardiovascular:     Rate and Rhythm: Normal rate and regular rhythm.     Heart sounds: No murmur.  Pulmonary:     Effort: Pulmonary effort is normal. No respiratory distress.     Breath sounds: Normal breath sounds.  Abdominal:     Palpations: Abdomen is soft.     Tenderness: There is  no abdominal tenderness.  Musculoskeletal:     Cervical back: Neck supple.     Comments: No C-spine tenderness, mild T-spine tenderness, there is some L-spine tenderness, no step-off or deformity  Mild tenderness over right lateral hip, right lateral knee tenderness noted, normal range of motion joints, no ecchymosis or swelling noted  Skin:    General: Skin  is warm and dry.  Neurological:     General: No focal deficit present.     Mental Status: She is alert and oriented to person, place, and time.  Psychiatric:        Mood and Affect: Mood normal.     ED Results / Procedures / Treatments   Labs (all labs ordered are listed, but only abnormal results are displayed) Labs Reviewed - No data to display  EKG None  Radiology DG Thoracic Spine 2 View  Result Date: 05/09/2019 CLINICAL DATA:  Pain after fall EXAM: THORACIC SPINE 2 VIEWS COMPARISON:  None. FINDINGS: Possible mass in the left suprahilar region. The heart, right hilum, and mediastinum are normal. No pneumothorax. No focal infiltrates. No fracture or traumatic malalignment in the thoracic spine. Multilevel degenerative disc disease. IMPRESSION: 1. Possible left suprahilar mass. Recommend a dedicated PA and lateral chest x-ray for better evaluation. If the finding persists, recommend a CT scan. 2. No fracture or traumatic malalignment in the thoracic spine. Electronically Signed   By: Gerome Sam III M.D   On: 05/09/2019 21:45   DG Lumbar Spine Complete  Result Date: 05/09/2019 CLINICAL DATA:  Pain after fall EXAM: LUMBAR SPINE - COMPLETE 4+ VIEW COMPARISON:  None. FINDINGS: Mild multilevel degenerative disc disease with small anterior osteophytes. Suspected lower lumbar facet degenerative changes. No fracture or traumatic malalignment. IMPRESSION: No fracture or traumatic malalignment. Degenerative changes as above. Mild calcified atherosclerosis in the distal abdominal aorta. Electronically Signed   By: Gerome Sam III  M.D   On: 05/09/2019 21:42   CT Head Wo Contrast  Result Date: 05/09/2019 CLINICAL DATA:  Head trauma, headache Fall while using restroom at [facility], striking head against sink, fell on wet floor. EXAM: CT HEAD WITHOUT CONTRAST TECHNIQUE: Contiguous axial images were obtained from the base of the skull through the vertex without intravenous contrast. COMPARISON:  Head CT 08/08/2017 FINDINGS: Brain: No intracranial hemorrhage, mass effect, or midline shift. No hydrocephalus. The basilar cisterns are patent. Partially empty sella, unchanged from prior exam. No evidence of territorial infarct or acute ischemia. No extra-axial or intracranial fluid collection. Vascular: No hyperdense vessel or unexpected calcification. Skull: No fracture or focal lesion. Sinuses/Orbits: Paranasal sinuses and mastoid air cells are clear. The visualized orbits are unremarkable. Other: None. IMPRESSION: No acute intracranial abnormality. No skull fracture. Electronically Signed   By: Narda Rutherford M.D.   On: 05/09/2019 21:12   DG Knee Complete 4 Views Right  Result Date: 05/09/2019 CLINICAL DATA:  Pain after fall EXAM: RIGHT KNEE - COMPLETE 4+ VIEW COMPARISON:  None. FINDINGS: Moderate tricompartmental degenerative changes. No fracture or dislocation. No joint effusion. IMPRESSION: Moderate tricompartmental degenerative changes. Electronically Signed   By: Gerome Sam III M.D   On: 05/09/2019 21:41   DG Hip Unilat W or Wo Pelvis 2-3 Views Right  Result Date: 05/09/2019 CLINICAL DATA:  Pain after fall. EXAM: DG HIP (WITH OR WITHOUT PELVIS) 2-3V RIGHT COMPARISON:  None. FINDINGS: There is no evidence of hip fracture or dislocation. There is no evidence of arthropathy or other focal bone abnormality. IMPRESSION: Negative. Electronically Signed   By: Gerome Sam III M.D   On: 05/09/2019 21:40    Procedures Procedures (including critical care time)  Medications Ordered in ED Medications    oxyCODONE-acetaminophen (PERCOCET/ROXICET) 5-325 MG per tablet 1 tablet (1 tablet Oral Given 05/09/19 2135)    ED Course  I have reviewed the triage vital signs and the nursing notes.  Pertinent  labs & imaging results that were available during my care of the patient were reviewed by me and considered in my medical decision making (see chart for details).    MDM Rules/Calculators/A&P                      49 year old lady presenting to ER after mechanical fall.  Plain films right lower extremity were negative.  She had focal tenderness over her right lateral tibial plateau.  Able to bear weight, but did have some difficulty with ambulation due to her knee pain, so proceeded to get CT knee which was negative.  T-spine x-ray incidentally noted possible left sided lung mass, radiologist recommending dedicated chest x-ray subsequently recommending CT chest to further evaluate.  Patient is a smoker.  CT chest noted no abnormalities, though noted study was limited due to lack of IV contrast.  I recommended patient follow-up with her primary doctor regarding ongoing monitoring. Will discharge home, follow-up with Ortho if she continues to have pain.  Return to ER as needed.    After the discussed management above, the patient was determined to be safe for discharge.  The patient was in agreement with this plan and all questions regarding their care were answered.  ED return precautions were discussed and the patient will return to the ED with any significant worsening of condition.   Final Clinical Impression(s) / ED Diagnoses Final diagnoses:  Fall, initial encounter  Knee strain, right, initial encounter    Rx / DC Orders ED Discharge Orders    None       Milagros Loll, MD 05/10/19 [id number]

## 2019-05-09 NOTE — ED Triage Notes (Signed)
Pt had fall while using rest room at [facility] dollar reports that he twisted right knee and struck head against sink while falling on wet floor

## 2019-05-10 MED ORDER — OXYCODONE-ACETAMINOPHEN 5-325 MG PO TABS
1.0000 | ORAL_TABLET | Freq: Once | ORAL | Status: AC
  Administered 2019-05-10: 1 via ORAL
  Filled 2019-05-10: qty 1

## 2019-05-10 MED ORDER — OXYCODONE-ACETAMINOPHEN 5-325 MG PO TABS: 1.0000 | ORAL_TABLET | ORAL | 0 refills | Status: DC | PRN

## 2019-05-10 NOTE — Discharge Instructions (Addendum)
Please call orthopedic doctor regarding your knee pain for follow-up appointment.  Recommend weightbearing as tolerated, can use crutches as needed for mobility.  Use pain medicine as prescribed.  Note can make you drowsy taken while driving or operating heavy machinery. Recommend follow up with primary doctor given the abnormal finding on the chest xray and to discuss if any ongoing monitoring is needed.

## 2019-05-13 ENCOUNTER — Other Ambulatory Visit: Payer: Self-pay

## 2019-05-13 ENCOUNTER — Ambulatory Visit (INDEPENDENT_AMBULATORY_CARE_PROVIDER_SITE_OTHER): Payer: Medicaid Other | Admitting: Neurology

## 2019-05-13 DIAGNOSIS — M79605 Pain in left leg: Secondary | ICD-10-CM | POA: Diagnosis not present

## 2019-05-13 DIAGNOSIS — M79604 Pain in right leg: Secondary | ICD-10-CM | POA: Diagnosis not present

## 2019-05-13 NOTE — Procedures (Signed)
[facility]  [address] [place], [address]  [place], [place] [address] Tel: [phone] Fax:  [phone] Test Date:  05/13/2019  Patient: [name] DOB: Dec 06, 1970 Physician: Nita Sickle, DO  Sex: Female Height: 5\' 5"  Ref Phys: , MD  ID#: Rise Paganini Temp: 32.0C Technician:    Patient Complaints: This is a 49 year old female referred for evaluation of bilateral leg pain.  NCV & EMG Findings: Electrodiagnostic testing was limited to nerve conduction studies of the right lower extremity only, as patient requested to terminate testing due to pain.  Findings are as follows: 1. Right sural and superficial peroneal sensory responses are within normal limits. 2. Right peroneal and tibial motor responses are within normal limits.  Proximal tibial motor response was incomplete as patient was unable to tolerate maximal stimulus.  Impression: This is a limited study as patient requested to terminate testing due to pain.    Nerve conduction study of the right leg is normal and shows no evidence of a large fiber sensorimotor polyneuropathy.   ___________________________ 44, DO    Nerve Conduction Studies Anti Sensory Summary Table   Site NR Peak (ms) Norm Peak (ms) P-T Amp (V) Norm P-T Amp  Right Sup Peroneal Anti Sensory (Ant Lat Mall)  32C  12 cm    2.4 <4.6 17.5 >4  Right Sural Anti Sensory (Lat Mall)  32C  Calf    2.6 <4.6 20.8 >4   Motor Summary Table   Site NR Onset (ms) Norm Onset (ms) O-P Amp (mV) Norm O-P Amp Site1 Site2 Delta-0 (ms) Dist (cm) Vel (m/s) Norm Vel (m/s)  Right Peroneal Motor (Ext Dig Brev)  32C  Ankle    2.8 <6.0 5.6 >2.5 B Fib Ankle 8.2 0.0  >40  B Fib    11.0  5.9  Poplt B Fib 1.7 0.0  >40  Poplt    12.7  5.5         Right Tibial Motor (Abd Hall Brev)  32C    unable to tolerate stim  Ankle    5.9 <6.0 6.2 >4 Knee Ankle 7.9 0.0  >40  Knee    13.8  0.6             Waveforms:

## 2019-05-15 ENCOUNTER — Encounter: Payer: Medicaid Other | Admitting: Physical Medicine and Rehabilitation

## 2019-05-15 ENCOUNTER — Telehealth: Payer: Self-pay | Admitting: *Deleted

## 2019-05-15 NOTE — Telephone Encounter (Signed)
Pt appt today was at 2pm and showed up at 2:30pm pt would like a call back to reschedule her appt.

## 2019-05-16 NOTE — Telephone Encounter (Signed)
Patient is rescheduled for 2/17 at 1500.

## 2019-05-21 ENCOUNTER — Ambulatory Visit: Payer: Medicaid Other | Admitting: Orthopedic Surgery

## 2019-05-21 ENCOUNTER — Other Ambulatory Visit: Payer: Self-pay | Admitting: Primary Care

## 2019-05-21 ENCOUNTER — Other Ambulatory Visit: Payer: Self-pay | Admitting: Adult Health

## 2019-05-21 DIAGNOSIS — E119 Type 2 diabetes mellitus without complications: Secondary | ICD-10-CM

## 2019-05-22 ENCOUNTER — Telehealth: Payer: Self-pay | Admitting: Orthopedic Surgery

## 2019-05-22 NOTE — Telephone Encounter (Signed)
IC s/w patient. 

## 2019-05-22 NOTE — Telephone Encounter (Signed)
Patient called requesting a call back from Dr. Diamantina Providence nurse. Patient states it is urgent but would not give any further information. Patient phone number is [phone].

## 2019-05-22 NOTE — Telephone Encounter (Signed)
I called talked with patient at length. She states she is trying to apply for disability.  She wanted to know if you could help her with this due to her knees. I advised this is not something that you typically do but would let you know and if you decided you would fill out forms I would call her back.  I advised she should go through her PCP for this since she states she was trying to file due to diabetes, neuropathy, HTN etc.

## 2019-05-26 NOTE — Telephone Encounter (Signed)
I will fill out forms for Social Security disability.  Essentially she has a medical record documenting knee arthritis and Social Security will summon those records and decide based on his records whether or not she gets any disability.  Is not really up to me.  Essentially the medical record addressing her arthritis speaks for itself in that regard.  I typically do not dictate additional letters for that issue.  If she has an attorney working for her in he thinks there is something in the record or something that needs to be in the record is not there then I can see her and we can figure that out but and as a rule I not really do letters for disability

## 2019-05-28 ENCOUNTER — Other Ambulatory Visit: Payer: Self-pay

## 2019-05-28 ENCOUNTER — Ambulatory Visit (INDEPENDENT_AMBULATORY_CARE_PROVIDER_SITE_OTHER): Payer: Medicaid Other | Admitting: Physical Medicine and Rehabilitation

## 2019-05-28 ENCOUNTER — Encounter: Payer: Self-pay | Admitting: Physical Medicine and Rehabilitation

## 2019-05-28 ENCOUNTER — Ambulatory Visit: Payer: Self-pay

## 2019-05-28 VITALS — BP 148/96

## 2019-05-28 DIAGNOSIS — G894 Chronic pain syndrome: Secondary | ICD-10-CM | POA: Diagnosis not present

## 2019-05-28 DIAGNOSIS — R202 Paresthesia of skin: Secondary | ICD-10-CM | POA: Diagnosis not present

## 2019-05-28 DIAGNOSIS — M5442 Lumbago with sciatica, left side: Secondary | ICD-10-CM | POA: Diagnosis not present

## 2019-05-28 DIAGNOSIS — M47816 Spondylosis without myelopathy or radiculopathy, lumbar region: Secondary | ICD-10-CM | POA: Diagnosis not present

## 2019-05-28 DIAGNOSIS — M5441 Lumbago with sciatica, right side: Secondary | ICD-10-CM

## 2019-05-28 DIAGNOSIS — G8929 Other chronic pain: Secondary | ICD-10-CM

## 2019-05-28 MED ORDER — METHYLPREDNISOLONE ACETATE 80 MG/ML IJ SUSP: 40.0000 mg | Freq: Once | INTRAMUSCULAR | Status: DC

## 2019-05-28 NOTE — Progress Notes (Signed)
Numeric Pain Rating Scale and Functional Assessment Average Pain 10   In the last MONTH (on 0-10 scale) has pain interfered with the following?  1. General activity like being  able to carry out your everyday physical activities such as walking, climbing stairs, carrying groceries, or moving a chair?  Rating(10)   +Driver, -BT, -Dye Allergies. 

## 2019-06-02 ENCOUNTER — Encounter: Payer: Self-pay | Admitting: Physical Medicine and Rehabilitation

## 2019-06-02 NOTE — Progress Notes (Signed)
[name] - 49 y.o. female MRN [id number]  Date of birth: Sep 26, 1970  Office Visit Note: Visit Date: 05/28/2019 PCP: Grayce Sessions, NP Referred by: Grayce Sessions, NP  Subjective: Chief Complaint  Patient presents with  . Lower Back - Pain   HPI: [name] is a 49 y.o. female who comes in today For evaluation management and potential lumbar L5-S1 facet joint block bilaterally.  She is followed by Dr. Burnard Bunting and has had chronic worsening low back pain with bilateral paresthesia numbness and tingling.  She has had this for some time without any relief.  No focal weakness.  Has been also treating her for her knees.  Her case is complicated by history of migraine headache as well as type 2 diabetes.  No prior history of polyneuropathy.  Patient was recently seen and evaluated by Dr. Nita Sickle of [facility].  The electrodiagnostic study had to be halted to the patient's pain response.  Of the 1 leg they completed they did not see any polyneuropathy.  They did not do any needle exam [place] of at least telling if there was a radiculopathy.  After the patient has failed conservative care Dr. August Saucer did get MRI of the lumbar spine.  This is reviewed below and reviewed with the patient.  Using spine models and imaging I did show the patient that she had very mild findings of the lumbar spine with some mild arthritis without stenosis or nerve impingement or disc herniation.  Her case is further complicated by morbid obesity.  Review of Systems  Musculoskeletal: Positive for back pain and joint pain.  Neurological: Positive for tremors.  All other systems reviewed and are negative.  Otherwise per HPI.  Assessment & Plan: Visit Diagnoses:  1. Spondylosis without myelopathy or radiculopathy, lumbar region   2. Chronic bilateral low back pain with bilateral sciatica   3. Paresthesia of skin   4. Chronic pain syndrome     Plan: Findings:  Chronic worsening low back  pain with bilateral leg pain with paresthesia which could be radicular in nature but there is nothing in the lumbar spine that would indicate a reason to have that.  I have seen people with facet arthritis and underlying fibromyalgia type syndrome have paresthesias even with facet joint arthritis.  We elected to try to complete facet joint diagnostic injections today with fluoroscopic guidance.  As seen in the procedure note the patient did get past the ethyl chloride on the skin and decided not to have the procedure completed.  From a pain management standpoint she has had off and on small amounts of tramadol and Tylenol 3 as well as oxycodone.  Most of these have been through Dr. August Saucer but a couple other practitioners have given her a few tablets.  I think the next step if spine injection is still seem to be a possible treatment for her and she will need to be referred to one of the physicians in town and may be able to do this with sedation.  Those would include Odette Fraction, MD, Claria Dice, MD and Sheran Luz, MD.  She is currently taking a small amount of Lyrica and this may need to be increased and she may just do better in a pain management situation with a can look at medication treatment and coping strategies etc.    Meds & Orders:  Meds ordered this encounter  Medications  . DISCONTD: methylPREDNISolone acetate (DEPO-MEDROL) injection 40 mg  Orders Placed This Encounter  Procedures  . Facet Injection  . XR C-ARM NO REPORT    Follow-up: Return for visit to requesting physician as needed.   Procedures: No procedures performed  Lumbar Facet Joint Intra-Articular Injection(s) with Fluoroscopic Guidance  Patient: [name]      Date of Birth: 1970-11-20 MRN: [id number] PCP: Kerin Perna, NP      Visit Date: 05/28/2019   Universal Protocol:    Date/Time: 05/28/2019  Consent Given By: the patient  Position: PRONE   Additional Comments: Vital signs were monitored before  and after the procedure. Patient was prepped and draped in the usual sterile fashion. The correct patient, procedure, and site was verified.   Injection Procedure Details:  Procedure Site One Meds Administered:  Meds ordered this encounter  Medications  . DISCONTD: methylPREDNISolone acetate (DEPO-MEDROL) injection 40 mg     Laterality: Bilateral  Location/Site:  L4-L5  Needle size: 22 guage  Needle type: Spinal  Needle Placement: Articular  Findings:  -Comments:   Procedure Details: The fluoroscope beam is vertically oriented in AP, and the inferior recess is visualized beneath the lower pole of the inferior apophyseal process, which represents the target point for needle insertion. When direct visualization is difficult the target point is located at the medial projection of the vertebral pedicle.  After ethyl chloride was used to provide partial topical anesthetic to the desired location of the patient halted the procedure did not want to proceed.  Post-procedure details: Patient was observed during the procedure. Post-procedure instructions were reviewed.  Patient left the clinic in stable condition.     Clinical History: MRI LUMBAR SPINE WITHOUT CONTRAST  TECHNIQUE: Multiplanar, multisequence MR imaging of the lumbar spine was performed. No intravenous contrast was administered.  COMPARISON:  Plain films lumbar spine 02/17/2019.  FINDINGS: Segmentation:  Standard.  Alignment:  Normal.  Vertebrae: No fracture, evidence of discitis, or worrisome bone lesion. Small hemangioma in T12 incidentally noted.  Conus medullaris and cauda equina: Conus extends to the L1 level. Conus and cauda equina appear normal.  Paraspinal and other soft tissues: Negative.  Disc levels:  T11-12 is imaged in the sagittal plane only and negative.  T12-L1: Negative.  L1-2: Mild facet degenerative change. Otherwise negative.  L2-3: Mild facet degenerative change  and ligamentum flavum thickening. Otherwise negative.  L3-4: Negative.  L4-5: Mild-to-moderate facet degenerative change. There is a very shallow disc bulge to the right. No stenosis.  L5-S1: Bilateral facet degenerative change is more notable on the right. No disc bulge or protrusion. No stenosis.  IMPRESSION: Negative for central canal or foraminal stenosis with only a minimal disc bulge seen at L4-5. Intervertebral discs are otherwise normal.  Scattered facet arthropathy appears worst on the right at L4-5 and L5-S1.   Electronically Signed   By: Inge Rise M.D.   On: 04/19/2019 15:49   She reports that she has been smoking cigarettes. She has been smoking about 0.24 packs per day. She has never used smokeless tobacco.  Recent Labs    08/01/18 1034 10/10/18 1354 01/10/19 1205  HGBA1C 6.9* 7.5* 6.5*    Objective:  VS:  HT:    WT:   BMI:     BP:(!) 148/96  HR: bpm  TEMP: ( )  RESP:  Physical Exam Vitals and nursing note reviewed.  Constitutional:      General: She is not in acute distress.    Appearance: Normal appearance. She is well-developed. She is obese. She is  not ill-appearing.  HENT:     Head: Normocephalic and atraumatic.  Eyes:     Conjunctiva/sclera: Conjunctivae normal.     Pupils: Pupils are equal, round, and reactive to light.  Cardiovascular:     Rate and Rhythm: Normal rate.     Pulses: Normal pulses.  Pulmonary:     Effort: Pulmonary effort is normal.  Musculoskeletal:     Right lower leg: No edema.     Left lower leg: No edema.     Comments: Patient has some pain going from sit to stand in full extension.  She is tender across the lower spine PSIS and greater trochanters bilaterally.  She has no pain with hip movement.  She has good distal strength.  Skin:    General: Skin is warm and dry.     Findings: No erythema or rash.  Neurological:     General: No focal deficit present.     Mental Status: She is alert and oriented to  person, place, and time.     Sensory: No sensory deficit.     Motor: No abnormal muscle tone.     Coordination: Coordination normal.     Gait: Gait normal.  Psychiatric:        Mood and Affect: Mood normal.        Behavior: Behavior normal.     Ortho Exam Imaging: No results found.  Past Medical/Family/Surgical/Social History: Medications & Allergies reviewed per EMR, new medications updated. Patient Active Problem List   Diagnosis Date Noted  . Hot flashes 10/02/2018  . Vaginal dryness 10/02/2018  . BIH (benign intracranial hypertension) 11/06/2017  . Somnolence, daytime 07/30/2017  . Snoring 07/30/2017  . Morbid obesity (HCC) 07/30/2017  . Migraine 10/02/2016  . Common migraine with intractable migraine 10/02/2016  . Type 2 diabetes mellitus without complication, without long-term current use of insulin (HCC) 08/17/2016   Past Medical History:  Diagnosis Date  . Anxiety   . Common migraine with intractable migraine 10/02/2016  . Depression   . Diabetes mellitus   . Hypertension    Family History  Problem Relation Age of Onset  . Diabetes Mother   . Hypertension Mother   . Renal Disease Mother   . Cancer Father    Past Surgical History:  Procedure Laterality Date  . BTL     Social History   Occupational History  . Not on file  Tobacco Use  . Smoking status: Current Every Day Smoker    Packs/day: 0.24    Types: Cigarettes  . Smokeless tobacco: Never Used  Substance and Sexual Activity  . Alcohol use: No  . Drug use: No  . Sexual activity: Yes    Birth control/protection: None

## 2019-06-02 NOTE — Procedures (Signed)
Lumbar Facet Joint Intra-Articular Injection(s) with Fluoroscopic Guidance  Patient: [name]      Date of Birth: 1970/05/15 MRN: [id number] PCP: Grayce Sessions, NP      Visit Date: 05/28/2019   Universal Protocol:    Date/Time: 05/28/2019  Consent Given By: the patient  Position: PRONE   Additional Comments: Vital signs were monitored before and after the procedure. Patient was prepped and draped in the usual sterile fashion. The correct patient, procedure, and site was verified.   Injection Procedure Details:  Procedure Site One Meds Administered:  Meds ordered this encounter  Medications  . DISCONTD: methylPREDNISolone acetate (DEPO-MEDROL) injection 40 mg     Laterality: Bilateral  Location/Site:  L4-L5  Needle size: 22 guage  Needle type: Spinal  Needle Placement: Articular  Findings:  -Comments:   Procedure Details: The fluoroscope beam is vertically oriented in AP, and the inferior recess is visualized beneath the lower pole of the inferior apophyseal process, which represents the target point for needle insertion. When direct visualization is difficult the target point is located at the medial projection of the vertebral pedicle.  After ethyl chloride was used to provide partial topical anesthetic to the desired location of the patient halted the procedure did not want to proceed.  Post-procedure details: Patient was observed during the procedure. Post-procedure instructions were reviewed.  Patient left the clinic in stable condition.

## 2019-06-03 ENCOUNTER — Telehealth: Payer: Self-pay | Admitting: Physical Medicine and Rehabilitation

## 2019-06-03 NOTE — Telephone Encounter (Signed)
I would send her to the [facility] radiology group thank you

## 2019-06-03 NOTE — Telephone Encounter (Signed)
Please advise. Thanks.  

## 2019-06-04 NOTE — Telephone Encounter (Signed)
[place] [facility] DOES NOT do sedation, she recommended maybe the surgical center or hospital. The only thing they would do is give pt a valium.

## 2019-06-04 NOTE — Telephone Encounter (Signed)
See below. [facility] does not do sedation for ESI. Please advise. Thanks.

## 2019-06-04 NOTE — Telephone Encounter (Signed)
Can you please find out or do you know if [facility] does ESI's with sedation?

## 2019-06-09 ENCOUNTER — Ambulatory Visit (INDEPENDENT_AMBULATORY_CARE_PROVIDER_SITE_OTHER): Payer: Medicaid Other | Admitting: Orthopedic Surgery

## 2019-06-09 ENCOUNTER — Other Ambulatory Visit: Payer: Self-pay

## 2019-06-09 ENCOUNTER — Encounter: Payer: Self-pay | Admitting: Orthopedic Surgery

## 2019-06-09 DIAGNOSIS — M5442 Lumbago with sciatica, left side: Secondary | ICD-10-CM | POA: Diagnosis not present

## 2019-06-09 DIAGNOSIS — G8929 Other chronic pain: Secondary | ICD-10-CM | POA: Diagnosis not present

## 2019-06-09 DIAGNOSIS — M5441 Lumbago with sciatica, right side: Secondary | ICD-10-CM | POA: Diagnosis not present

## 2019-06-09 NOTE — Telephone Encounter (Signed)
Patient has appt this afternoon. Can discuss at that time.

## 2019-06-09 NOTE — Telephone Encounter (Signed)
Ok for valium  that is the best we can do woulld not go to sleep for this

## 2019-06-10 MED ORDER — DIAZEPAM 10 MG PO TABS: ORAL_TABLET | ORAL | 0 refills | Status: DC

## 2019-06-10 MED ORDER — HYDROCODONE-ACETAMINOPHEN 5-325 MG PO TABS: 1.0000 | ORAL_TABLET | Freq: Every evening | ORAL | 0 refills | Status: DC | PRN

## 2019-06-14 ENCOUNTER — Emergency Department (HOSPITAL_COMMUNITY)
Admission: EM | Admit: 2019-06-14 | Discharge: 2019-06-15 | Disposition: A | Discharge status: Home / Self Care | Payer: Medicaid Other | Attending: Emergency Medicine | Admitting: Emergency Medicine

## 2019-06-14 ENCOUNTER — Encounter (HOSPITAL_COMMUNITY): Payer: Self-pay

## 2019-06-14 ENCOUNTER — Other Ambulatory Visit: Payer: Self-pay

## 2019-06-14 ENCOUNTER — Emergency Department (HOSPITAL_COMMUNITY): Payer: Medicaid Other

## 2019-06-14 ENCOUNTER — Encounter: Payer: Self-pay | Admitting: Orthopedic Surgery

## 2019-06-14 DIAGNOSIS — R0789 Other chest pain: Secondary | ICD-10-CM | POA: Diagnosis present

## 2019-06-14 DIAGNOSIS — E119 Type 2 diabetes mellitus without complications: Secondary | ICD-10-CM | POA: Diagnosis not present

## 2019-06-14 DIAGNOSIS — G8929 Other chronic pain: Secondary | ICD-10-CM | POA: Diagnosis not present

## 2019-06-14 DIAGNOSIS — M545 Low back pain, unspecified: Secondary | ICD-10-CM

## 2019-06-14 DIAGNOSIS — R0600 Dyspnea, unspecified: Secondary | ICD-10-CM | POA: Diagnosis not present

## 2019-06-14 DIAGNOSIS — R531 Weakness: Secondary | ICD-10-CM | POA: Insufficient documentation

## 2019-06-14 DIAGNOSIS — I10 Essential (primary) hypertension: Secondary | ICD-10-CM | POA: Insufficient documentation

## 2019-06-14 DIAGNOSIS — R61 Generalized hyperhidrosis: Secondary | ICD-10-CM | POA: Diagnosis not present

## 2019-06-14 DIAGNOSIS — F17210 Nicotine dependence, cigarettes, uncomplicated: Secondary | ICD-10-CM | POA: Insufficient documentation

## 2019-06-14 DIAGNOSIS — Z794 Long term (current) use of insulin: Secondary | ICD-10-CM | POA: Insufficient documentation

## 2019-06-14 DIAGNOSIS — Z79899 Other long term (current) drug therapy: Secondary | ICD-10-CM | POA: Insufficient documentation

## 2019-06-14 MED ORDER — SODIUM CHLORIDE 0.9% FLUSH
3.0000 mL | Freq: Once | INTRAVENOUS | Status: AC
  Administered 2019-06-15: 3 mL via INTRAVENOUS

## 2019-06-14 NOTE — Progress Notes (Signed)
Office Visit Note   Patient: [name]           Date of Birth: 10-03-1970           MRN: [id number] Visit Date: 06/09/2019 Requested by: Kerin Perna, NP [address] [place],  [place] [address] PCP: Kerin Perna, NP  Subjective: Chief Complaint  Patient presents with  . Back Pain    HPI: [name] is a 49 y.o. female who presents to the office complaining of low back pain.  She returns to the office as she was not able to tolerate back injections.  She is requesting sedation.  She reports continued severe low back pain with bilateral radicular pain.  She also notes numbness and tingling that travels down the posterior leg into the bottom of her foot.  Her back pain is bothering her more so than her leg pain.  She is taking tramadol for pain relief but notes that "Norco works better".  She notes subjective weakness in her legs.  She denies any saddle anesthesia or bowel/bladder incontinence.  She has no history of back surgery.  She was able to walk down the hall today without too much noticeable difficulty.  Patient also reports right arm pain that began this weekend.  This is associated with subjective weakness of the right arm and numbness/tingling that radiates down the right arm.  She has no history of neck surgery.  No injury leading to onset of pain..                ROS:  All systems reviewed are negative as they relate to the chief complaint within the history of present illness.  Patient denies fevers or chills.  Assessment & Plan: Visit Diagnoses:  1. Chronic bilateral low back pain with bilateral sciatica     Plan: Patient is a 49 year old female who presents complaining of primarily lumbar back pain.  Due to her severe pain she is having difficulty tolerating back injections without sedation.  Prescribed Norco 5 mg to take at night to help with sleep #10.  Additionally prescribed Valium to take 30 minutes prior to her procedure.  Referred to Dr.  Laurence Spates for lumbar spine ESI's.  If these do not provide significant relief then she will need to consider surgery as an option.  However her MRI findings are not as impressive as her described symptoms.  She may need further work-up with CT myelogram to better delineate any pathology which may have changed since her last lumbar spine MRI.  No red flag symptoms today.  Follow-Up Instructions: No follow-ups on file.   Orders:  Orders Placed This Encounter  Procedures  . Ambulatory referral to Physical Medicine Rehab   Meds ordered this encounter  Medications  . diazepam (VALIUM) 10 MG tablet    Sig: Take one tablet 30 mins prior to procedure. Do not take with pain medication or operate a vehicle while taking medication.    Dispense:  2 tablet    Refill:  0  . HYDROcodone-acetaminophen (NORCO/VICODIN) 5-325 MG tablet    Sig: Take 1 tablet by mouth at bedtime as needed for moderate pain.    Dispense:  10 tablet    Refill:  0      Procedures: No procedures performed   Clinical Data: No additional findings.  Objective: Vital Signs: There were no vitals taken for this visit.  Physical Exam:  Constitutional: Patient appears well-developed HEENT:  Head: Normocephalic Eyes:EOM are normal Neck: Normal  range of motion Cardiovascular: Normal rate Pulmonary/chest: Effort normal Neurologic: Patient is alert Skin: Skin is warm Psychiatric: Patient has normal mood and affect  Ortho Exam:  Significant tenderness to palpation throughout the axial lumbar spine.  Sensation intact through all dermatomes of the bilateral lower extremities.  No focal loss of strength throughout the bilateral lower extremities.  No significant tenderness to palpation throughout the axial cervical spine.  5/5 motor strength of the bilateral grip, finger adduction, finger abduction, pronation, supination, bicep flexion, tricep extension, deltoid abduction.  Sensation intact through all dermatomes of the  bilateral upper extremities.  Specialty Comments:  No specialty comments available.  Imaging: No results found.   PMFS History: Patient Active Problem List   Diagnosis Date Noted  . Hot flashes 10/02/2018  . Vaginal dryness 10/02/2018  . BIH (benign intracranial hypertension) 11/06/2017  . Somnolence, daytime 07/30/2017  . Snoring 07/30/2017  . Morbid obesity (HCC) 07/30/2017  . Migraine 10/02/2016  . Common migraine with intractable migraine 10/02/2016  . Type 2 diabetes mellitus without complication, without long-term current use of insulin (HCC) 08/17/2016   Past Medical History:  Diagnosis Date  . Anxiety   . Common migraine with intractable migraine 10/02/2016  . Depression   . Diabetes mellitus   . Hypertension     Family History  Problem Relation Age of Onset  . Diabetes Mother   . Hypertension Mother   . Renal Disease Mother   . Cancer Father     Past Surgical History:  Procedure Laterality Date  . BTL     Social History   Occupational History  . Not on file  Tobacco Use  . Smoking status: Current Every Day Smoker    Packs/day: 0.24    Types: Cigarettes  . Smokeless tobacco: Never Used  Substance and Sexual Activity  . Alcohol use: No  . Drug use: No  . Sexual activity: Yes    Birth control/protection: None

## 2019-06-14 NOTE — ED Notes (Signed)
Pt transported to XR.  

## 2019-06-14 NOTE — ED Provider Notes (Signed)
[facility]-EMERGENCY DEPT Provider Note   CSN: [id number] Arrival date & time: 06/14/19  2258   History Chief Complaint  Patient presents with  . Chest Pain  . Extremity Weakness    [name] is a 49 y.o. female.  The history is provided by the patient.  Chest Pain Extremity Weakness Associated symptoms include chest pain.  She has history of hypertension, diabetes, hyperlipidemia and comes in complaining of rash in her chest and pain in her lower back for the last 3 days.  Pain is constant and she rates it at 9/10.  There is associated dyspnea and diaphoresis but no nausea.  She states that her legs hurt when she tries to stand on them and is got to the point where she cannot stand.  She denies any bowel or bladder continence.  Nothing makes her symptoms better, nothing makes them worse.  She does have history of chronic back pain but states that it is currently worse than it had been.  She has not taken anything for any of her symptoms.  She is a non-smoker but does have a family history of premature coronary atherosclerosis.  Past Medical History:  Diagnosis Date  . Anxiety   . Common migraine with intractable migraine 10/02/2016  . Depression   . Diabetes mellitus   . Hypertension     Patient Active Problem List   Diagnosis Date Noted  . Hot flashes 10/02/2018  . Vaginal dryness 10/02/2018  . BIH (benign intracranial hypertension) 11/06/2017  . Somnolence, daytime 07/30/2017  . Snoring 07/30/2017  . Morbid obesity (HCC) 07/30/2017  . Migraine 10/02/2016  . Common migraine with intractable migraine 10/02/2016  . Type 2 diabetes mellitus without complication, without long-term current use of insulin (HCC) 08/17/2016    Past Surgical History:  Procedure Laterality Date  . BTL       OB History    Gravida  5   Para  3   Term  3   Preterm      AB  2   Living  3     SAB  1   TAB  1   Ectopic      Multiple      Live Births             Family History  Problem Relation Age of Onset  . Diabetes Mother   . Hypertension Mother   . Renal Disease Mother   . Cancer Father     Social History   Tobacco Use  . Smoking status: Current Every Day Smoker    Packs/day: 0.24    Types: Cigarettes  . Smokeless tobacco: Never Used  Substance Use Topics  . Alcohol use: No  . Drug use: No    Home Medications Prior to Admission medications   Medication Sig Start Date End Date Taking? Authorizing Provider  aspirin EC 81 MG tablet Take 81 mg by mouth every 6 (six) hours as needed for mild pain.     [provider]  diazepam (VALIUM) 10 MG tablet Take one tablet 30 mins prior to procedure. Do not take with pain medication or operate a vehicle while taking medication. 06/10/19   Magnant, Charles L, PA-C  EASY COMFORT PEN NEEDLES 31G X 5 MM MISC USE AS INSTRUCTED. TWICE DAILY. 11/08/18   Grayce Sessions, NP  escitalopram (LEXAPRO) 20 MG tablet TAKE 1 TABLET (20 MG TOTAL) BY MOUTH DAILY. 12/10/18   Grayce Sessions, NP  fluticasone (FLOVENT  HFA) 44 MCG/ACT inhaler Inhale 2 puffs into the lungs 2 (two) times daily. 01/19/19   Grayce Sessions, NP  furosemide (LASIX) 20 MG tablet TAKE 2 TABLETS DAILY Patient taking differently: Take 40 mg by mouth every evening. TAKE 2 TABLETS DAILY 03/10/19   Butch Penny, NP  glimepiride (AMARYL) 4 MG tablet TAKE 1 TABLET (4 MG TOTAL) BY MOUTH DAILY BEFORE BREAKFAST. 05/21/19   Grayce Sessions, NP  glucose blood (ACCU-CHEK AVIVA PLUS) test strip Use as instructed 01/02/18   Loletta Specter, PA-C  HYDROcodone-acetaminophen (NORCO/VICODIN) 5-325 MG tablet Take 1 tablet by mouth at bedtime as needed for moderate pain. 06/10/19   Magnant, Charles L, PA-C  insulin aspart (NOVOLOG) 100 UNIT/ML injection Inject 10 Units into the skin 3 (three) times daily before meals. 01/10/19   Grayce Sessions, NP  Insulin Detemir (LEVEMIR FLEXTOUCH) 100 UNIT/ML Pen INJECT 50 UNITS INTO THE  SKIN 2 (TWO) TIMES DAILY. 01/10/19   Grayce Sessions, NP  Lancets (ACCU-CHEK SOFT TOUCH) lancets Use as instructed 08/01/18   Grayce Sessions, NP  metFORMIN (GLUCOPHAGE) 1000 MG tablet TAKE 1 TABLET (1,000 MG TOTAL) BY MOUTH 2 (TWO) TIMES DAILY WITH A MEAL. 03/10/19   Grayce Sessions, NP  oxyCODONE-acetaminophen (PERCOCET) 5-325 MG tablet Take 1 tablet by mouth every 4 (four) hours as needed for severe pain. 05/10/19   Milagros Loll, MD  pantoprazole (PROTONIX) 40 MG tablet Take 1 tablet (40 mg total) by mouth daily. 05/08/19   Grayce Sessions, NP  potassium chloride (K-DUR) 10 MEQ tablet TAKE 2 TABLETS (20 MEQ TOTAL) BY MOUTH DAILY. 12/10/18   Butch Penny, NP  pravastatin (PRAVACHOL) 10 MG tablet TAKE 1 TABLET (10 MG TOTAL) BY MOUTH DAILY. 10/29/18   Grayce Sessions, NP  pregabalin (LYRICA) 25 MG capsule Take 25 mg by mouth 2 (two) times daily as needed. 05/08/19   [provider]  PROVENTIL HFA 108 (90 Base) MCG/ACT inhaler INHALE 1 TO 2 PUFFS BY MOUTH EVERY 6 (SIX) HOURS AS NEEDED FOR WHEEZING OR SHORTNESS OF BREATH. 01/10/19   Grayce Sessions, NP  traMADol (ULTRAM) 50 MG tablet Take 1 tablet (50 mg total) by mouth every 12 (twelve) hours as needed. Patient taking differently: Take 50 mg by mouth every 12 (twelve) hours as needed for moderate pain.  03/18/19   Magnant, Joycie Peek, PA-C    Allergies    Lisinopril  Review of Systems   Review of Systems  Cardiovascular: Positive for chest pain.  Musculoskeletal: Positive for extremity weakness.  All other systems reviewed and are negative.   Physical Exam Updated Vital Signs BP 131/76 (BP Location: Left Arm)   Pulse 80   Temp 98.1 F (36.7 C) (Oral)   Resp 18   Ht 5\' 5"  (1.651 m)   Wt 108.9 kg   SpO2 96%   BMI 39.94 kg/m   Physical Exam Vitals and nursing note reviewed.   49 year old female, resting comfortably and in no acute distress. Vital signs are normal. Oxygen saturation is 96%, which is  normal. Head is normocephalic and atraumatic. PERRLA, EOMI. Oropharynx is clear. Neck is nontender and supple without adenopathy or JVD. Back is moderately tender in the mid and lower lumbar area with bilateral paralumbar spasm.  There is no CVA tenderness.  Straight leg raise is positive bilaterally at 30 degrees. Lungs are clear without rales, wheezes, or rhonchi. Chest is moderately tender diffusely. Heart has regular rate and rhythm without  murmur. Abdomen is soft, flat, nontender without masses or hepatosplenomegaly and peristalsis is normoactive. Extremities have no cyanosis or edema, full range of motion is present. Skin is warm and dry without rash. Neurologic: Mental status is normal, cranial nerves are intact, there are no motor or sensory deficits.  ED Results / Procedures / Treatments   Labs (all labs ordered are listed, but only abnormal results are displayed) Labs Reviewed  BASIC METABOLIC PANEL - Abnormal; Notable for the following components:      Result Value   Glucose, Bld 152 (*)    Calcium 8.5 (*)    All other components within normal limits  CBC - Abnormal; Notable for the following components:   WBC 13.0 (*)    RBC 5.41 (*)    MCV 74.5 (*)    MCH 25.0 (*)    All other components within normal limits  D-DIMER, QUANTITATIVE (NOT AT [facility])  I-STAT BETA HCG BLOOD, ED ([facility], [facility], [facility] ONLY)  TROPONIN I (HIGH SENSITIVITY)  TROPONIN I (HIGH SENSITIVITY)    EKG EKG Interpretation  Date/Time:  Saturday June 14 2019 23:20:56 EST Ventricular Rate:  85 PR Interval:    QRS Duration: 84 QT Interval:  371 QTC Calculation: 442 R Axis:   43 Text Interpretation: Sinus rhythm Probable left atrial enlargement RSR' in V1 or V2, probably normal variant Borderline T wave abnormalities When compared with ECG of 01/13/2004, Nonspecific T wave abnormality is now present Confirmed by Dione Booze ([id number]) on 06/14/2019 11:31:15 PM   Radiology DG Chest 2 View  Result Date:  06/15/2019 CLINICAL DATA:  Chest pain. Bilateral leg weakness over the last 3 days. EXAM: CHEST - 2 VIEW COMPARISON:  05/09/2019 FINDINGS: Heart size remains normal. Mediastinal shadows are normal. Patient has developed infiltrates in both lower lobes consistent with lower lobe pneumonia. No dense consolidation or lobar collapse. No effusions. Bony structures unremarkable. IMPRESSION: Development of abnormal density in both lower lobes consistent with pneumonia. Electronically Signed   By: Paulina Fusi M.D.   On: 06/15/2019 00:04    Procedures Procedures   Medications Ordered in ED Medications  sodium chloride flush (NS) 0.9 % injection 3 mL (has no administration in time range)    ED Course  I have reviewed the triage vital signs and the nursing notes.  Pertinent labs & imaging results that were available during my care of the patient were reviewed by me and considered in my medical decision making (see chart for details).  MDM Rules/Calculators/A&P Chest pain of uncertain cause.  Constant pain for 3 days is unlikely to be cardiac ischemia.  ECG shows minimal T wave flattening in the anterolateral leads.  Back pain appears to be exacerbation of her chronic low back pain.  Old records are reviewed, and CT of the chest on 05/09/2019 showed normal caliber of thoracic aorta, MRI of the lumbar spine on 04/19/2019 showed no significant disc disease or spinal stenosis.  Chest x-ray is read by radiologist as showing bilateral pneumonia.  However, her clinical picture is not that of pneumonia and I do not see anything that looks like pneumonia on the x-ray.  Will check D-dimer to rule out pulmonary embolism.  She will be given a dose of ketorolac.  Labs are reassuring.  Troponin is normal x2, D-dimer is normal.  She had significant improvement with IV ketorolac.  She is discharged with instructions to use over-the-counter NSAIDs, follow-up with PCP.  Final Clinical Impression(s) / ED Diagnoses Final  diagnoses:  Atypical  chest pain  Acute exacerbation of chronic low back pain    Rx / DC Orders ED Discharge Orders    None       Delora Fuel, MD 71/85/50 [phone]

## 2019-06-14 NOTE — ED Triage Notes (Signed)
Pt reports bilateral leg weakness for 3 days. She also reports centralized chest pain/pressure that started today. She states that it feels worse with deep breathing and nothing makes it feel better. States that the pain radiates down her arms bilaterally and tingles in her fingers. Dexterity noted by texting during triage. A&Ox4.

## 2019-06-15 LAB — CBC
HCT: 40.3 % (ref 36.0–46.0)
Hemoglobin: 13.5 g/dL (ref 12.0–15.0)
MCH: 25 pg — ABNORMAL LOW (ref 26.0–34.0)
MCHC: 33.5 g/dL (ref 30.0–36.0)
MCV: 74.5 fL — ABNORMAL LOW (ref 80.0–100.0)
Platelets: 263 10*3/uL (ref 150–400)
RBC: 5.41 MIL/uL — ABNORMAL HIGH (ref 3.87–5.11)
RDW: 14.4 % (ref 11.5–15.5)
WBC: 13 10*3/uL — ABNORMAL HIGH (ref 4.0–10.5)
nRBC: 0 % (ref 0.0–0.2)

## 2019-06-15 LAB — BASIC METABOLIC PANEL
Anion gap: 8 (ref 5–15)
BUN: 14 mg/dL (ref 6–20)
CO2: 24 mmol/L (ref 22–32)
Calcium: 8.5 mg/dL — ABNORMAL LOW (ref 8.9–10.3)
Chloride: 109 mmol/L (ref 98–111)
Creatinine, Ser: 0.81 mg/dL (ref 0.44–1.00)
GFR calc Af Amer: >60 mL/min (ref >60)
GFR calc non Af Amer: >60 mL/min (ref >60)
Glucose, Bld: 152 mg/dL — ABNORMAL HIGH (ref 70–99)
Potassium: 3.6 mmol/L (ref 3.5–5.1)
Sodium: 141 mmol/L (ref 135–145)

## 2019-06-15 LAB — D-DIMER, QUANTITATIVE: D-Dimer, Quant: 0.38 ug/mL-FEU (ref 0.00–0.50)

## 2019-06-15 LAB — I-STAT BETA HCG BLOOD, ED (MC, WL, AP ONLY): I-stat hCG, quantitative: <5 m[IU]/mL (ref <5)

## 2019-06-15 LAB — TROPONIN I (HIGH SENSITIVITY)
Troponin I (High Sensitivity): 5 ng/L (ref <18)
Troponin I (High Sensitivity): 5 ng/L (ref <18)

## 2019-06-15 MED ORDER — KETOROLAC TROMETHAMINE 30 MG/ML IJ SOLN
30.0000 mg | Freq: Once | INTRAMUSCULAR | Status: AC
  Administered 2019-06-15: 30 mg via INTRAVENOUS
  Filled 2019-06-15: qty 1

## 2019-06-15 MED ORDER — ASPIRIN 81 MG PO CHEW
324.0000 mg | CHEWABLE_TABLET | Freq: Once | ORAL | Status: AC
  Administered 2019-06-15: 324 mg via ORAL
  Filled 2019-06-15: qty 4

## 2019-06-15 NOTE — Discharge Instructions (Signed)
Take ibuprofen or naproxen as needed for pain.  Apply ice to sore areas as needed.  Return If symptoms are getting worse.

## 2019-06-19 ENCOUNTER — Ambulatory Visit: Payer: Medicaid Other

## 2019-06-19 ENCOUNTER — Ambulatory Visit (INDEPENDENT_AMBULATORY_CARE_PROVIDER_SITE_OTHER): Payer: Medicaid Other | Admitting: Primary Care

## 2019-06-21 ENCOUNTER — Emergency Department (HOSPITAL_COMMUNITY): Payer: Medicaid Other

## 2019-06-21 ENCOUNTER — Encounter (HOSPITAL_COMMUNITY): Payer: Self-pay

## 2019-06-21 ENCOUNTER — Emergency Department (HOSPITAL_COMMUNITY)
Admission: EM | Admit: 2019-06-21 | Discharge: 2019-06-22 | Disposition: A | Discharge status: Home / Self Care | Payer: Medicaid Other | Attending: Emergency Medicine | Admitting: Emergency Medicine

## 2019-06-21 ENCOUNTER — Other Ambulatory Visit: Payer: Self-pay

## 2019-06-21 DIAGNOSIS — Z7982 Long term (current) use of aspirin: Secondary | ICD-10-CM | POA: Diagnosis not present

## 2019-06-21 DIAGNOSIS — Z794 Long term (current) use of insulin: Secondary | ICD-10-CM | POA: Insufficient documentation

## 2019-06-21 DIAGNOSIS — E119 Type 2 diabetes mellitus without complications: Secondary | ICD-10-CM | POA: Diagnosis not present

## 2019-06-21 DIAGNOSIS — I10 Essential (primary) hypertension: Secondary | ICD-10-CM | POA: Diagnosis not present

## 2019-06-21 DIAGNOSIS — F17210 Nicotine dependence, cigarettes, uncomplicated: Secondary | ICD-10-CM | POA: Insufficient documentation

## 2019-06-21 DIAGNOSIS — R0789 Other chest pain: Secondary | ICD-10-CM | POA: Diagnosis present

## 2019-06-21 DIAGNOSIS — R079 Chest pain, unspecified: Secondary | ICD-10-CM

## 2019-06-21 DIAGNOSIS — Z79899 Other long term (current) drug therapy: Secondary | ICD-10-CM | POA: Insufficient documentation

## 2019-06-21 LAB — I-STAT BETA HCG BLOOD, ED (MC, WL, AP ONLY): I-stat hCG, quantitative: <5 m[IU]/mL (ref <5)

## 2019-06-21 LAB — CBC
HCT: 44.2 % (ref 36.0–46.0)
Hemoglobin: 14.8 g/dL (ref 12.0–15.0)
MCH: 25.2 pg — ABNORMAL LOW (ref 26.0–34.0)
MCHC: 33.5 g/dL (ref 30.0–36.0)
MCV: 75.2 fL — ABNORMAL LOW (ref 80.0–100.0)
Platelets: 320 10*3/uL (ref 150–400)
RBC: 5.88 MIL/uL — ABNORMAL HIGH (ref 3.87–5.11)
RDW: 14.5 % (ref 11.5–15.5)
WBC: 15.5 10*3/uL — ABNORMAL HIGH (ref 4.0–10.5)
nRBC: 0 % (ref 0.0–0.2)

## 2019-06-21 LAB — BASIC METABOLIC PANEL
Anion gap: 12 (ref 5–15)
BUN: 15 mg/dL (ref 6–20)
CO2: 26 mmol/L (ref 22–32)
Calcium: 9.7 mg/dL (ref 8.9–10.3)
Chloride: 105 mmol/L (ref 98–111)
Creatinine, Ser: 0.78 mg/dL (ref 0.44–1.00)
GFR calc Af Amer: >60 mL/min (ref >60)
GFR calc non Af Amer: >60 mL/min (ref >60)
Glucose, Bld: 183 mg/dL — ABNORMAL HIGH (ref 70–99)
Potassium: 3.9 mmol/L (ref 3.5–5.1)
Sodium: 143 mmol/L (ref 135–145)

## 2019-06-21 LAB — HEPATIC FUNCTION PANEL
ALT: 51 U/L — ABNORMAL HIGH (ref 0–44)
AST: 27 U/L (ref 15–41)
Albumin: 4.1 g/dL (ref 3.5–5.0)
Alkaline Phosphatase: 158 U/L — ABNORMAL HIGH (ref 38–126)
Bilirubin, Direct: 0.1 mg/dL (ref 0.0–0.2)
Indirect Bilirubin: 0.4 mg/dL (ref 0.3–0.9)
Total Bilirubin: 0.5 mg/dL (ref 0.3–1.2)
Total Protein: 8.1 g/dL (ref 6.5–8.1)

## 2019-06-21 LAB — TROPONIN I (HIGH SENSITIVITY)
Troponin I (High Sensitivity): 6 ng/L (ref <18)
Troponin I (High Sensitivity): 7 ng/L (ref <18)

## 2019-06-21 MED ORDER — ASPIRIN 81 MG PO CHEW
324.0000 mg | CHEWABLE_TABLET | Freq: Once | ORAL | Status: AC
  Administered 2019-06-21: 324 mg via ORAL
  Filled 2019-06-21: qty 4

## 2019-06-21 MED ORDER — KETOROLAC TROMETHAMINE 30 MG/ML IJ SOLN
30.0000 mg | Freq: Once | INTRAMUSCULAR | Status: AC
  Administered 2019-06-21: 30 mg via INTRAVENOUS
  Filled 2019-06-21: qty 1

## 2019-06-21 MED ORDER — MORPHINE SULFATE (PF) 4 MG/ML IV SOLN
4.0000 mg | Freq: Once | INTRAVENOUS | Status: AC
  Administered 2019-06-21: 4 mg via INTRAVENOUS
  Filled 2019-06-21: qty 1

## 2019-06-21 MED ORDER — SODIUM CHLORIDE 0.9% FLUSH
3.0000 mL | Freq: Once | INTRAVENOUS | Status: AC
  Administered 2019-06-21: 3 mL via INTRAVENOUS

## 2019-06-21 MED ORDER — NAPROXEN 375 MG PO TABS: 375.0000 mg | ORAL_TABLET | Freq: Two times a day (BID) | ORAL | 0 refills | Status: DC

## 2019-06-21 MED ORDER — ONDANSETRON HCL 4 MG/2ML IJ SOLN
4.0000 mg | Freq: Once | INTRAMUSCULAR | Status: AC
  Administered 2019-06-21: 4 mg via INTRAVENOUS
  Filled 2019-06-21: qty 2

## 2019-06-21 NOTE — ED Notes (Signed)
Pt back from x-ray.

## 2019-06-21 NOTE — ED Provider Notes (Signed)
Patient care signed out from Dr. Lynelle Doctor at end of shift C/o CP, constant, x 1 week, radiates to arm Labs pending, will need delta trop Heart Score 5 EKG unchanged No ss/sxs illness, no significant SOB  11:10 - second troponin collected. The patient reports she feels better. She is updated on test results so far. Delta trop explained. Anticipate discharge home.   11:45 - delta trop is negative. Patient can be discharged home per plan of previous treatment team, including cardiology referral.      Danne Harbor 06/21/19 2346    Linwood Dibbles, MD 06/22/19 1243

## 2019-06-21 NOTE — ED Provider Notes (Signed)
[facility]-EMERGENCY DEPT Provider Note   CSN: [id number] Arrival date & time: 06/21/19  2009     History Chief Complaint  Patient presents with  . Chest Pain    [name] is a 49 y.o. female.  HPI  HPI: A 49 year old patient with a history of treated diabetes presents for evaluation of chest pain. Initial onset of pain was more than 6 hours ago. The patient's chest pain is described as heaviness/pressure/tightness and is not worse with exertion. The patient's chest pain is middle- or left-sided, is not well-localized, is not sharp and does radiate to the arms/jaw/neck. The patient does not complain of nausea and denies diaphoresis. The patient has smoked in the past 90 days and has a family history of coronary artery disease in a first-degree relative with onset less than age 25. The patient has no history of stroke, has no history of peripheral artery disease, is not hypertensive, has no history of hypercholesterolemia and does not have an elevated BMI (>=30).  Patient states she has been having pain now since at least the sixth.  Patient was seen in the emergency room then.  She continues to have pain.  Pain also goes into her right arm and feels like a constant pressure in her chest.  She feels sweaty at times at night but is not having any diaphoresis now.  Patient is scheduled to follow-up with her doctor but does not have an appointment till next week. Past Medical History:  Diagnosis Date  . Anxiety   . Common migraine with intractable migraine 10/02/2016  . Depression   . Diabetes mellitus   . Hypertension     Patient Active Problem List   Diagnosis Date Noted  . Hot flashes 10/02/2018  . Vaginal dryness 10/02/2018  . BIH (benign intracranial hypertension) 11/06/2017  . Somnolence, daytime 07/30/2017  . Snoring 07/30/2017  . Morbid obesity (HCC) 07/30/2017  . Migraine 10/02/2016  . Common migraine with intractable migraine 10/02/2016  . Type 2  diabetes mellitus without complication, without long-term current use of insulin (HCC) 08/17/2016    Past Surgical History:  Procedure Laterality Date  . BTL       OB History    Gravida  5   Para  3   Term  3   Preterm      AB  2   Living  3     SAB  1   TAB  1   Ectopic      Multiple      Live Births              Family History  Problem Relation Age of Onset  . Diabetes Mother   . Hypertension Mother   . Renal Disease Mother   . Cancer Father     Social History   Tobacco Use  . Smoking status: Current Every Day Smoker    Packs/day: 0.24    Types: Cigarettes  . Smokeless tobacco: Never Used  Substance Use Topics  . Alcohol use: No  . Drug use: No    Home Medications Prior to Admission medications   Medication Sig Start Date End Date Taking? Authorizing Provider  aspirin EC 81 MG tablet Take 81 mg by mouth every 6 (six) hours as needed for mild pain.     [provider]  EASY COMFORT PEN NEEDLES 31G X 5 MM MISC USE AS INSTRUCTED. TWICE DAILY. 11/08/18   Grayce Sessions, NP  escitalopram (LEXAPRO) 20  MG tablet TAKE 1 TABLET (20 MG TOTAL) BY MOUTH DAILY. 12/10/18   Grayce Sessions, NP  fluticasone (FLOVENT HFA) 44 MCG/ACT inhaler Inhale 2 puffs into the lungs 2 (two) times daily. 01/19/19   Grayce Sessions, NP  furosemide (LASIX) 20 MG tablet TAKE 2 TABLETS DAILY Patient taking differently: Take 40 mg by mouth every evening. TAKE 2 TABLETS DAILY 03/10/19   Butch Penny, NP  glimepiride (AMARYL) 4 MG tablet TAKE 1 TABLET (4 MG TOTAL) BY MOUTH DAILY BEFORE BREAKFAST. 05/21/19   Grayce Sessions, NP  glucose blood (ACCU-CHEK AVIVA PLUS) test strip Use as instructed 01/02/18   Loletta Specter, PA-C  HYDROcodone-acetaminophen (NORCO/VICODIN) 5-325 MG tablet Take 1 tablet by mouth at bedtime as needed for moderate pain. 06/10/19   Magnant, Charles L, PA-C  insulin aspart (NOVOLOG) 100 UNIT/ML injection Inject 10 Units into the skin 3  (three) times daily before meals. 01/10/19   Grayce Sessions, NP  Insulin Detemir (LEVEMIR FLEXTOUCH) 100 UNIT/ML Pen INJECT 50 UNITS INTO THE SKIN 2 (TWO) TIMES DAILY. 01/10/19   Grayce Sessions, NP  Lancets (ACCU-CHEK SOFT TOUCH) lancets Use as instructed 08/01/18   Grayce Sessions, NP  metFORMIN (GLUCOPHAGE) 1000 MG tablet TAKE 1 TABLET (1,000 MG TOTAL) BY MOUTH 2 (TWO) TIMES DAILY WITH A MEAL. 03/10/19   Grayce Sessions, NP  pantoprazole (PROTONIX) 40 MG tablet Take 1 tablet (40 mg total) by mouth daily. 05/08/19   Grayce Sessions, NP  pravastatin (PRAVACHOL) 10 MG tablet TAKE 1 TABLET (10 MG TOTAL) BY MOUTH DAILY. 10/29/18   Grayce Sessions, NP  pregabalin (LYRICA) 25 MG capsule Take 25 mg by mouth 2 (two) times daily as needed. 05/08/19   [provider]  PROVENTIL HFA 108 (90 Base) MCG/ACT inhaler INHALE 1 TO 2 PUFFS BY MOUTH EVERY 6 (SIX) HOURS AS NEEDED FOR WHEEZING OR SHORTNESS OF BREATH. 01/10/19   Grayce Sessions, NP    Allergies    Lisinopril  Review of Systems   Review of Systems  Respiratory: Positive for cough and shortness of breath.   All other systems reviewed and are negative.   Physical Exam Updated Vital Signs BP (!) 143/105   Pulse 98   Temp 98.2 F (36.8 C) (Oral)   Resp 12   Ht 1.651 m (5\' 5" )   Wt 113.4 kg   SpO2 94%   BMI 41.60 kg/m   Physical Exam Vitals and nursing note reviewed.  Constitutional:      General: She is not in acute distress.    Appearance: She is well-developed.  HENT:     Head: Normocephalic and atraumatic.     Right Ear: External ear normal.     Left Ear: External ear normal.  Eyes:     General: No scleral icterus.       Right eye: No discharge.        Left eye: No discharge.     Conjunctiva/sclera: Conjunctivae normal.  Neck:     Trachea: No tracheal deviation.  Cardiovascular:     Rate and Rhythm: Normal rate and regular rhythm.     Comments: Normal pulses all extremities Pulmonary:      Effort: Pulmonary effort is normal. No respiratory distress.     Breath sounds: Normal breath sounds. No stridor. No wheezing or rales.  Abdominal:     General: Bowel sounds are normal. There is no distension.     Palpations: Abdomen is soft.  Tenderness: There is no abdominal tenderness. There is no guarding or rebound.  Musculoskeletal:        General: No tenderness.     Cervical back: Neck supple.     Right lower leg: No tenderness. No edema.     Left lower leg: No tenderness. No edema.  Skin:    General: Skin is warm and dry.     Findings: No rash.  Neurological:     Mental Status: She is alert.     Cranial Nerves: No cranial nerve deficit (no facial droop, extraocular movements intact, no slurred speech).     Sensory: No sensory deficit.     Motor: No abnormal muscle tone or seizure activity.     Coordination: Coordination normal.     ED Results / Procedures / Treatments   Labs (all labs ordered are listed, but only abnormal results are displayed) Labs Reviewed  CBC - Abnormal; Notable for the following components:      Result Value   WBC 15.5 (*)    RBC 5.88 (*)    MCV 75.2 (*)    MCH 25.2 (*)    All other components within normal limits  BASIC METABOLIC PANEL  HEPATIC FUNCTION PANEL  I-STAT BETA HCG BLOOD, ED ([facility], [facility], [facility] ONLY)  TROPONIN I (HIGH SENSITIVITY)    EKG EKG Interpretation  Date/Time:  Saturday June 21 2019 20:29:28 EST Ventricular Rate:  91 PR Interval:    QRS Duration: 94 QT Interval:  350 QTC Calculation: 431 R Axis:   58 Text Interpretation: Sinus rhythm Biatrial enlargement No significant change since last tracing Confirmed by Dorie Rank [phone]) on 06/21/2019 8:41:50 PM   Radiology DG Chest 2 View  Result Date: 06/21/2019 CLINICAL DATA:  49 year old female with chest pain. EXAM: CHEST - 2 VIEW COMPARISON:  Chest radiograph dated 06/14/2019. FINDINGS: There is no focal consolidation, pleural effusion, pneumothorax. The cardiac silhouette  is within normal limits. No acute osseous pathology. IMPRESSION: No active cardiopulmonary disease. Electronically Signed   By: Anner Crete M.D.   On: 06/21/2019 20:08    Procedures Procedures (including critical care time)  Medications Ordered in ED Medications  sodium chloride flush (NS) 0.9 % injection 3 mL (3 mLs Intravenous Given 06/21/19 2115)  morphine 4 MG/ML injection 4 mg (4 mg Intravenous Given 06/21/19 2116)  aspirin chewable tablet 324 mg (324 mg Oral Given 06/21/19 2115)  ondansetron (ZOFRAN) injection 4 mg (4 mg Intravenous Given 06/21/19 2137)    ED Course  I have reviewed the triage vital signs and the nursing notes.  Pertinent labs & imaging results that were available during my care of the patient were reviewed by me and considered in my medical decision making (see chart for details).  Clinical Course as of Jun 20 2141  Sat Jun 21, 2019  2116 Previous work-up reviewed.  Negative D-dimer 6 days ago.  Patient had a CT scan of her chest in January that did not show any abnormalities of her aorta   [JK]    Clinical Course User Index [JK] Dorie Rank, MD   MDM Rules/Calculators/A&P HEAR Score: 5                    Patient presents with recurrent chest pain.  She is moderate risk per heart score.  Patient had recent evaluation with negative D-dimer.  She has had previous CT scan of her chest in January that did not show any aortic abnormalities.  I do not  feel that CT angiogram is necessary at this time.  White blood cell count is elevated but this seems similar to previous.  I have added on LFTs although she is not having abdominal pain.  Troponins are pending.  Care turned over to PA Upstill Final Clinical Impression(s) / ED Diagnoses Final diagnoses:  None    Rx / DC Orders ED Discharge Orders    None       Linwood Dibbles, MD 06/21/19 2201

## 2019-06-21 NOTE — ED Triage Notes (Signed)
Per PT, Pt states she has chest tightness, pain in her shoulders that radiates down her arms, and back. Pt also states she has some shortness of breath that has lasted for apprx 2 days. Pt states she took ibuprofen apprx 2 hrs ago, and hydrocodone yesterday without relief.

## 2019-06-21 NOTE — ED Notes (Signed)
Pt in xray

## 2019-06-21 NOTE — Discharge Instructions (Addendum)
Follow up with cardiology for further outpatient evaluation of chest pain.   If you develop new or worsening symptoms, please return to the emergency department at any time.

## 2019-06-23 ENCOUNTER — Encounter (INDEPENDENT_AMBULATORY_CARE_PROVIDER_SITE_OTHER): Payer: Self-pay | Admitting: Primary Care

## 2019-06-23 ENCOUNTER — Ambulatory Visit (INDEPENDENT_AMBULATORY_CARE_PROVIDER_SITE_OTHER): Payer: Medicaid Other | Admitting: Primary Care

## 2019-06-23 ENCOUNTER — Other Ambulatory Visit: Payer: Self-pay

## 2019-06-23 VITALS — BP 145/97 | HR 91 | Temp 96.1°F | Ht 65.0 in | Wt 255.6 lb

## 2019-06-23 DIAGNOSIS — E785 Hyperlipidemia, unspecified: Secondary | ICD-10-CM | POA: Diagnosis not present

## 2019-06-23 DIAGNOSIS — E119 Type 2 diabetes mellitus without complications: Secondary | ICD-10-CM

## 2019-06-23 DIAGNOSIS — F17200 Nicotine dependence, unspecified, uncomplicated: Secondary | ICD-10-CM

## 2019-06-23 DIAGNOSIS — M2550 Pain in unspecified joint: Secondary | ICD-10-CM

## 2019-06-23 DIAGNOSIS — G629 Polyneuropathy, unspecified: Secondary | ICD-10-CM | POA: Diagnosis not present

## 2019-06-23 LAB — GLUCOSE, POCT (MANUAL RESULT ENTRY): POC Glucose: 211 mg/dl — AB (ref 70–99)

## 2019-06-23 MED ORDER — LOSARTAN POTASSIUM 25 MG PO TABS: 25.0000 mg | ORAL_TABLET | Freq: Every day | ORAL | 1 refills | Status: DC

## 2019-06-23 MED ORDER — CELECOXIB 200 MG PO CAPS: 200.0000 mg | ORAL_CAPSULE | Freq: Two times a day (BID) | ORAL | 0 refills | Status: DC

## 2019-06-23 MED ORDER — LEVEMIR FLEXTOUCH 100 UNIT/ML ~~LOC~~ SOPN: PEN_INJECTOR | SUBCUTANEOUS | 3 refills | Status: DC

## 2019-06-23 MED ORDER — PRAVASTATIN SODIUM 10 MG PO TABS: 10.0000 mg | ORAL_TABLET | Freq: Every day | ORAL | 1 refills | Status: DC

## 2019-06-23 MED ORDER — PREDNISONE 10 MG PO TABS: 10.0000 mg | ORAL_TABLET | Freq: Every day | ORAL | 0 refills | Status: DC

## 2019-06-23 MED ORDER — INSULIN ASPART 100 UNIT/ML ~~LOC~~ SOLN: 10.0000 [IU] | Freq: Three times a day (TID) | SUBCUTANEOUS | 3 refills | Status: DC

## 2019-06-23 MED ORDER — METFORMIN HCL 1000 MG PO TABS: 1000.0000 mg | ORAL_TABLET | Freq: Two times a day (BID) | ORAL | 0 refills | Status: DC

## 2019-06-23 MED ORDER — GLIMEPIRIDE 4 MG PO TABS: 4.0000 mg | ORAL_TABLET | Freq: Every day | ORAL | 0 refills | Status: DC

## 2019-06-23 NOTE — Progress Notes (Signed)
Established Patient Office Visit  Subjective:  Patient ID: [name], female    DOB: 03-08-1971  Age: 49 y.o. MRN: [id number]  CC:  Chief Complaint  Patient presents with  . Chest Pain    with shortness of breath   . Leg Pain    HPI [name] presents for aching all over her body and chest feels heavy and pain radiates to her neck , back and left on the right side. Presented to the emergency room twice for chest pain and extremity weakness.  Past Medical History:  Diagnosis Date  . Anxiety   . Common migraine with intractable migraine 10/02/2016  . Depression   . Diabetes mellitus   . Hypertension     Past Surgical History:  Procedure Laterality Date  . BTL      Family History  Problem Relation Age of Onset  . Diabetes Mother   . Hypertension Mother   . Renal Disease Mother   . Cancer Father     Social History   Socioeconomic History  . Marital status: Single    Spouse name: Not on file  . Number of children: Not on file  . Years of education: Not on file  . Highest education level: Not on file  Occupational History  . Not on file  Tobacco Use  . Smoking status: Current Every Day Smoker    Packs/day: 0.24    Types: Cigarettes  . Smokeless tobacco: Never Used  Substance and Sexual Activity  . Alcohol use: No  . Drug use: No  . Sexual activity: Yes    Birth control/protection: None  Other Topics Concern  . Not on file  Social History Narrative   Right handed    Lives with daughter   Caffeine use: Drinks coffee/tea/soda sometimes   Social Determinants of Health   Financial Resource Strain:   . Difficulty of Paying Living Expenses:   Food Insecurity:   . Worried About Programme researcher, broadcasting/film/video in the Last Year:   . Barista in the Last Year:   Transportation Needs:   . Freight forwarder (Medical):   Marland Kitchen Lack of Transportation (Non-Medical):   Physical Activity:   . Days of Exercise per Week:   . Minutes of Exercise per Session:    Stress:   . Feeling of Stress :   Social Connections:   . Frequency of Communication with Friends and Family:   . Frequency of Social Gatherings with Friends and Family:   . Attends Religious Services:   . Active Member of Clubs or Organizations:   . Attends Banker Meetings:   Marland Kitchen Marital Status:   Intimate Partner Violence:   . Fear of Current or Ex-Partner:   . Emotionally Abused:   Marland Kitchen Physically Abused:   . Sexually Abused:     Outpatient Medications Prior to Visit  Medication Sig Dispense Refill  . aspirin EC 81 MG tablet Take 81 mg by mouth every 6 (six) hours as needed for mild pain.     Marland Kitchen EASY COMFORT PEN NEEDLES 31G X 5 MM MISC USE AS INSTRUCTED. TWICE DAILY. 100 each 1  . fluticasone (FLOVENT HFA) 44 MCG/ACT inhaler Inhale 2 puffs into the lungs 2 (two) times daily. 1 Inhaler 12  . furosemide (LASIX) 20 MG tablet TAKE 2 TABLETS DAILY (Patient taking differently: Take 40 mg by mouth every evening. ) 60 tablet 0  . glucose blood (ACCU-CHEK AVIVA PLUS) test strip Use as instructed  100 each 12  . HYDROcodone-acetaminophen (NORCO/VICODIN) 5-325 MG tablet Take 1 tablet by mouth at bedtime as needed for moderate pain. 10 tablet 0  . Lancets (ACCU-CHEK SOFT TOUCH) lancets Use as instructed 100 each 12  . pantoprazole (PROTONIX) 40 MG tablet Take 1 tablet (40 mg total) by mouth daily. 30 tablet 3  . pregabalin (LYRICA) 25 MG capsule Take 25 mg by mouth 2 (two) times daily as needed (pain).     Marland Kitchen PROVENTIL HFA 108 (90 Base) MCG/ACT inhaler INHALE 1 TO 2 PUFFS BY MOUTH EVERY 6 (SIX) HOURS AS NEEDED FOR WHEEZING OR SHORTNESS OF BREATH. (Patient taking differently: Inhale 1-2 puffs into the lungs every 6 (six) hours as needed for wheezing or shortness of breath. ) 6.7 g 1  . escitalopram (LEXAPRO) 20 MG tablet TAKE 1 TABLET (20 MG TOTAL) BY MOUTH DAILY. 30 tablet 3  . glimepiride (AMARYL) 4 MG tablet TAKE 1 TABLET (4 MG TOTAL) BY MOUTH DAILY BEFORE BREAKFAST. 90 tablet 0  .  ibuprofen (ADVIL) 200 MG tablet Take 400 mg by mouth every 6 (six) hours as needed for moderate pain.    Marland Kitchen insulin aspart (NOVOLOG) 100 UNIT/ML injection Inject 10 Units into the skin 3 (three) times daily before meals. 10 mL 3  . Insulin Detemir (LEVEMIR FLEXTOUCH) 100 UNIT/ML Pen INJECT 50 UNITS INTO THE SKIN 2 (TWO) TIMES DAILY. (Patient taking differently: Inject 50 Units into the skin 2 (two) times daily. ) 30 mL 3  . metFORMIN (GLUCOPHAGE) 1000 MG tablet TAKE 1 TABLET (1,000 MG TOTAL) BY MOUTH 2 (TWO) TIMES DAILY WITH A MEAL. 180 tablet 0  . naproxen (NAPROSYN) 375 MG tablet Take 1 tablet (375 mg total) by mouth 2 (two) times daily. 20 tablet 0  . pravastatin (PRAVACHOL) 10 MG tablet TAKE 1 TABLET (10 MG TOTAL) BY MOUTH DAILY. 30 tablet 3   No facility-administered medications prior to visit.    Allergies  Allergen Reactions  . Lisinopril     angioedema    ROS Review of Systems  Respiratory: Positive for shortness of breath.   Cardiovascular: Positive for chest pain.  Musculoskeletal: Positive for arthralgias and back pain.  Neurological: Positive for headaches.  All other systems reviewed and are negative.     Objective:    Physical Exam  Constitutional: She is oriented to person, place, and time. She appears well-developed and well-nourished.  HENT:  Head: Normocephalic.  Cardiovascular: Normal rate and regular rhythm.  Pulmonary/Chest: Effort normal and breath sounds normal.  Abdominal: Bowel sounds are normal.  Musculoskeletal:        General: Tenderness present. Normal range of motion.     Cervical back: Normal range of motion and neck supple.  Neurological: She is alert and oriented to person, place, and time.  Skin: Skin is warm and dry.  Psychiatric: She has a normal mood and affect. Her behavior is normal. Judgment and thought content normal.    BP (!) 145/97 (BP Location: Left Arm, Patient Position: Sitting, Cuff Size: Large)   Pulse 91   Temp (!) 96.1 F  (35.6 C) (Temporal)   Ht 5\' 5"  (1.651 m)   Wt 255 lb 9.6 oz (115.9 kg)   SpO2 97%   BMI 42.53 kg/m  Wt Readings from Last 3 Encounters:  06/23/19 255 lb 9.6 oz (115.9 kg)  06/21/19 250 lb (113.4 kg)  06/14/19 240 lb (108.9 kg)     Health Maintenance Due  Topic Date Due  . PNEUMOCOCCAL POLYSACCHARIDE  VACCINE AGE 42-64 HIGH RISK  Never done  . FOOT EXAM  Never done  . COLONOSCOPY  Never done  . OPHTHALMOLOGY EXAM  10/20/2018  . URINE MICROALBUMIN  08/01/2019    There are no preventive care reminders to display for this patient.  Lab Results  Component Value Date   TSH 2.870 10/02/2018   Lab Results  Component Value Date   WBC 15.5 (H) 06/21/2019   HGB 14.8 06/21/2019   HCT 44.2 06/21/2019   MCV 75.2 (L) 06/21/2019   PLT 320 06/21/2019   Lab Results  Component Value Date   NA 143 06/21/2019   K 3.9 06/21/2019   CO2 26 06/21/2019   GLUCOSE 183 (H) 06/21/2019   BUN 15 06/21/2019   CREATININE 0.78 06/21/2019   BILITOT 0.5 06/21/2019   ALKPHOS 158 (H) 06/21/2019   AST 27 06/21/2019   ALT 51 (H) 06/21/2019   PROT 8.1 06/21/2019   ALBUMIN 4.1 06/21/2019   CALCIUM 9.7 06/21/2019   ANIONGAP 12 06/21/2019   Lab Results  Component Value Date   CHOL 185 08/01/2018   Lab Results  Component Value Date   HDL 40 08/01/2018   Lab Results  Component Value Date   LDLCALC 125 (H) 08/01/2018   Lab Results  Component Value Date   TRIG 102 08/01/2018   Lab Results  Component Value Date   CHOLHDL 4.6 (H) 08/01/2018   Lab Results  Component Value Date   HGBA1C 6.5 (A) 01/10/2019      Assessment & Plan:  Lillyanna was seen today for chest pain and leg pain.  Diagnoses and all orders for this visit:  Type 2 diabetes mellitus without complication, without long-term current use of insulin (HCC) The American Diabetes Association (ADA) has long recommended looser control for people who are more frail. In these official guidelines, they recommend an A1C target of 7.5%  for healthy people over 65, as compared to 7.0% for younger people. For people with other illnesses or impairments, their goal is 8.0% . -     Glucose (CBG) -     Rheumatoid Arthritis Profile  Arthralgia, unspecified joint Work on losing weight to help reduce joint pain. May alternate with heat and ice application for pain relief. Other alternatives include massage, acupuncture and water aerobics.  You must stay active and avoid a sedentary lifestyle. -     Rheumatoid Arthritis Profile celecoxib (CELEBREX) 200 MG capsule; Take 1 capsule (200 mg total) by mouth 2 (two) times daily.  Neuropathy Peripheral neuropathy develops slowly over time leading to a loss of sensation. Burning, stabbing, or aching pain in the legs or feet are common symptoms.This can lead to calluses or sores on areas of constant pressure, reduced ability to feel temperature changes.    predniSONE (DELTASONE) 10 MG tablet; Take 1 tablet (10 mg total) by mouth daily with breakfast. Take 6 tablets day 1 -day 2 take 5 tablets, day 3 take 4 tablets day 3 take 3 tablets day 2 take 2 tablets and last day take 1 tablet  Hyperlipidemia, unspecified hyperlipidemia type Decrease your fatty foods, red meat, cheese, milk and increase fiber like whole grains and veggies. Refilled pravastatin 10 mg at bedtime  Other orders -     predniSONE (DELTASONE) 10 MG tablet; Take 1 tablet (10 mg total) by mouth daily with breakfast. Take 6 tablets day 1 -day 2 take 5 tablets, day 3 take 4 tablets day 3 take 3 tablets day 2 take 2 tablets  and last day take 1 tablet -     celecoxib (CELEBREX) 200 MG capsule; Take 1 capsule (200 mg total) by mouth 2 (two) times daily. -     losartan (COZAAR) 25 MG tablet; Take 1 tablet (25 mg total) by mouth daily.    Meds ordered this encounter  Medications  . predniSONE (DELTASONE) 10 MG tablet    Sig: Take 1 tablet (10 mg total) by mouth daily with breakfast. Take 6 tablets day 1 -day 2 take 5 tablets, day 3  take 4 tablets day 3 take 3 tablets day 2 take 2 tablets and last day take 1 tablet    Dispense:  21 tablet    Refill:  0  . celecoxib (CELEBREX) 200 MG capsule    Sig: Take 1 capsule (200 mg total) by mouth 2 (two) times daily.    Dispense:  180 capsule    Refill:  0  . losartan (COZAAR) 25 MG tablet    Sig: Take 1 tablet (25 mg total) by mouth daily.    Dispense:  90 tablet    Refill:  1  . pravastatin (PRAVACHOL) 10 MG tablet    Sig: Take 1 tablet (10 mg total) by mouth daily.    Dispense:  90 tablet    Refill:  1  . glimepiride (AMARYL) 4 MG tablet    Sig: Take 1 tablet (4 mg total) by mouth daily before breakfast.    Dispense:  90 tablet    Refill:  0  . insulin aspart (NOVOLOG) 100 UNIT/ML injection    Sig: Inject 10 Units into the skin 3 (three) times daily before meals.    Dispense:  10 mL    Refill:  3  . metFORMIN (GLUCOPHAGE) 1000 MG tablet    Sig: Take 1 tablet (1,000 mg total) by mouth 2 (two) times daily with a meal.    Dispense:  180 tablet    Refill:  0  . insulin detemir (LEVEMIR FLEXTOUCH) 100 UNIT/ML FlexPen    Sig: INJECT 50 UNITS INTO THE SKIN 2 (TWO) TIMES DAILY.    Dispense:  30 mL    Refill:  3    Follow-up: Return in about 1 month (around 07/24/2019) for Diabetes in person.    Grayce Sessions, NP

## 2019-06-23 NOTE — Patient Instructions (Signed)
Arthritis Arthritis means joint pain. It can also mean joint disease. A joint is a place where bones come together. There are more than 100 types of arthritis. What are the causes? This condition may be caused by:  Wear and tear of a joint. This is the most common cause.  A lot of acid in the blood, which leads to pain in the joint (gout).  Pain and swelling (inflammation) in a joint.  Infection of a joint.  Injuries in the joint.  A reaction to medicines (allergy). In some cases, the cause may not be known. What are the signs or symptoms? Symptoms of this condition include:  Redness at a joint.  Swelling at a joint.  Stiffness at a joint.  Warmth coming from the joint.  A fever.  A feeling of being sick. How is this treated? This condition may be treated with:  Treating the cause, if it is known.  Rest.  Raising (elevating) the joint.  Putting cold or hot packs on the joint.  Medicines to treat symptoms and reduce pain and swelling.  Shots of medicines (cortisone) into the joint. You may also be told to make changes in your life, such as doing exercises and losing weight. Follow these instructions at home: Medicines  Take over-the-counter and prescription medicines only as told by your doctor.  Do not take aspirin for pain if your doctor says that you may have gout. Activity  Rest your joint if your doctor tells you to.  Avoid activities that make the pain worse.  Exercise your joint regularly as told by your doctor. Try doing exercises like: ? Swimming. ? Water aerobics. ? Biking. ? Walking. Managing pain, stiffness, and swelling      If told, put ice on the affected area. ? Put ice in a plastic bag. ? Place a towel between your skin and the bag. ? Leave the ice on for 20 minutes, 2-3 times per day.  If your joint is swollen, raise (elevate) it above the level of your heart if told by your doctor.  If your joint feels stiff in the morning,  try taking a warm shower.  If told, put heat on the affected area. Do this as often as told by your doctor. Use the heat source that your doctor recommends, such as a moist heat pack or a heating pad. If you have diabetes, do not apply heat without asking your doctor. To apply heat: ? Place a towel between your skin and the heat source. ? Leave the heat on for 20-30 minutes. ? Remove the heat if your skin turns bright red. This is very important if you are unable to feel pain, heat, or cold. You may have a greater risk of getting burned. General instructions  Do not use any products that contain nicotine or tobacco, such as cigarettes, e-cigarettes, and chewing tobacco. If you need help quitting, ask your doctor.  Keep all follow-up visits as told by your doctor. This is important. Contact a doctor if:  The pain gets worse.  You have a fever. Get help right away if:  You have very bad pain in your joint.  You have swelling in your joint.  Your joint is red.  Many joints become painful and swollen.  You have very bad back pain.  Your leg is very weak.  You cannot control your pee (urine) or poop (stool). Summary  Arthritis means joint pain. It can also mean joint disease. A joint is a place   where bones come together.  The most common cause of this condition is wear and tear of a joint.  Symptoms of this condition include redness, swelling, or stiffness of the joint.  This condition is treated with rest, raising the joint, medicines, and putting cold or hot packs on the joint.  Follow your doctor's instructions about medicines, activity, exercises, and other home care treatments. This information is not intended to replace advice given to you by your health care provider. Make sure you discuss any questions you have with your health care provider. Document Revised: 03/04/2018 Document Reviewed: 03/04/2018 Elsevier Patient Education  2020 [employer]  

## 2019-06-24 ENCOUNTER — Other Ambulatory Visit (INDEPENDENT_AMBULATORY_CARE_PROVIDER_SITE_OTHER): Payer: Self-pay | Admitting: Primary Care

## 2019-06-24 DIAGNOSIS — M2550 Pain in unspecified joint: Secondary | ICD-10-CM

## 2019-06-25 LAB — RHEUMATOID ARTHRITIS PROFILE
Cyclic Citrullin Peptide Ab: 11 units (ref 0–19)
Rheumatoid fact SerPl-aCnc: <10 IU/mL (ref 0.0–13.9)

## 2019-06-26 LAB — ENA+DNA/DS+ANTICH+CENTRO+FA...
Anti JO-1: <0.2 AI (ref 0.0–0.9)
Antiribosomal P Antibodies: <0.2 AI (ref 0.0–0.9)
Centromere Ab Screen: <0.2 AI (ref 0.0–0.9)
Chromatin Ab SerPl-aCnc: <0.2 AI (ref 0.0–0.9)
ENA RNP Ab: <0.2 AI (ref 0.0–0.9)
ENA SM Ab Ser-aCnc: <0.2 AI (ref 0.0–0.9)
ENA SSA (RO) Ab: <0.2 AI (ref 0.0–0.9)
ENA SSB (LA) Ab: <0.2 AI (ref 0.0–0.9)
Homogeneous Pattern: 1:80 {titer}
Scleroderma (Scl-70) (ENA) Antibody, IgG: <0.2 AI (ref 0.0–0.9)
Smith/RNP Antibodies: <0.2 AI (ref 0.0–0.9)
dsDNA Ab: 1 IU/mL (ref 0–9)

## 2019-06-26 LAB — ANA W/REFLEX: ANA Titer 1: POSITIVE — AB

## 2019-06-26 LAB — SPECIMEN STATUS REPORT

## 2019-06-30 ENCOUNTER — Encounter: Payer: Medicaid Other | Admitting: Physical Medicine and Rehabilitation

## 2019-07-02 ENCOUNTER — Other Ambulatory Visit: Payer: Self-pay | Admitting: Surgical

## 2019-07-02 ENCOUNTER — Telehealth: Payer: Self-pay | Admitting: Orthopedic Surgery

## 2019-07-02 ENCOUNTER — Telehealth (INDEPENDENT_AMBULATORY_CARE_PROVIDER_SITE_OTHER): Payer: Self-pay

## 2019-07-02 MED ORDER — HYDROCODONE-ACETAMINOPHEN 5-325 MG PO TABS: 1.0000 | ORAL_TABLET | Freq: Every evening | ORAL | 0 refills | Status: DC | PRN

## 2019-07-02 NOTE — Telephone Encounter (Signed)
Patient called and asking for pain medication. Patient asking for pain medication be sent in today due to severe pain. Please send to pharmacy on file. Patient asked for a call back when medication has been sent in. Patient phone number is [phone].

## 2019-07-02 NOTE — Telephone Encounter (Signed)
Patient called in regards to her test results wants to know if PCP or nurse have received the results. Patient would also like to talk to PCP in regards to pain medication.   Please advice [phone]

## 2019-07-02 NOTE — Telephone Encounter (Signed)
Pls advise. Thanks.  

## 2019-07-02 NOTE — Telephone Encounter (Signed)
She needs to follow-up with Dr. Alvester Morin to try L-spine ESI's as we talked about at her last OV. We supplied Norco and Valium as a bridge between her last OV and her seeing Dr. Alvester Morin.  I will RX Norco #10 but this is the last RX.

## 2019-07-02 NOTE — Telephone Encounter (Signed)
Sent to PCP to call and discuss results with patient.

## 2019-07-03 ENCOUNTER — Other Ambulatory Visit (INDEPENDENT_AMBULATORY_CARE_PROVIDER_SITE_OTHER): Payer: Self-pay | Admitting: Primary Care

## 2019-07-03 MED ORDER — PREDNISONE 10 MG PO TABS: 10.0000 mg | ORAL_TABLET | Freq: Every day | ORAL | 0 refills | Status: DC

## 2019-07-03 NOTE — Telephone Encounter (Signed)
Can you please update me status of patient being scheduled?

## 2019-07-03 NOTE — Telephone Encounter (Signed)
I looked at her labs.  I think she may have lupus.  I am not sure what she wants to be scheduled for.  I think it is reasonable to get a rheumatology for work-up of her abnormal ANA.  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

## 2019-07-03 NOTE — Telephone Encounter (Signed)
I spoke with patient. She verbalized understanding. She wants you to look at her labs. She said her PCP did some bloodwork on her and told her she could possibly have lupus? She said that they are referring her to rheumatology.

## 2019-07-03 NOTE — Telephone Encounter (Signed)
Spoke with patient agreed to give 7 days of prednisone dose pack for the inflammation. She may need to be on steroids until seen by rheumatoid

## 2019-07-03 NOTE — Telephone Encounter (Signed)
Pt had an appt 06/30/19 and r/s to 07/17/19 with a driver.

## 2019-07-10 ENCOUNTER — Ambulatory Visit (INDEPENDENT_AMBULATORY_CARE_PROVIDER_SITE_OTHER): Payer: Medicaid Other | Admitting: Primary Care

## 2019-07-10 ENCOUNTER — Other Ambulatory Visit (INDEPENDENT_AMBULATORY_CARE_PROVIDER_SITE_OTHER): Payer: Self-pay | Admitting: Primary Care

## 2019-07-10 DIAGNOSIS — R768 Other specified abnormal immunological findings in serum: Secondary | ICD-10-CM

## 2019-07-10 MED ORDER — PREDNISONE 10 MG PO TABS: 10.0000 mg | ORAL_TABLET | Freq: Every day | ORAL | 0 refills | Status: DC

## 2019-07-11 ENCOUNTER — Ambulatory Visit (INDEPENDENT_AMBULATORY_CARE_PROVIDER_SITE_OTHER): Payer: Medicaid Other | Admitting: Primary Care

## 2019-07-17 ENCOUNTER — Other Ambulatory Visit (INDEPENDENT_AMBULATORY_CARE_PROVIDER_SITE_OTHER): Payer: Self-pay | Admitting: Primary Care

## 2019-07-17 ENCOUNTER — Telehealth (INDEPENDENT_AMBULATORY_CARE_PROVIDER_SITE_OTHER): Payer: Self-pay

## 2019-07-17 ENCOUNTER — Ambulatory Visit: Payer: Self-pay

## 2019-07-17 ENCOUNTER — Encounter: Payer: Self-pay | Admitting: Physical Medicine and Rehabilitation

## 2019-07-17 ENCOUNTER — Other Ambulatory Visit: Payer: Self-pay

## 2019-07-17 ENCOUNTER — Ambulatory Visit (INDEPENDENT_AMBULATORY_CARE_PROVIDER_SITE_OTHER): Payer: Medicaid Other | Admitting: Physical Medicine and Rehabilitation

## 2019-07-17 VITALS — BP 156/99 | HR 91

## 2019-07-17 DIAGNOSIS — M47816 Spondylosis without myelopathy or radiculopathy, lumbar region: Secondary | ICD-10-CM

## 2019-07-17 NOTE — Progress Notes (Signed)
Numeric Pain Rating Scale and Functional Assessment Average Pain 10   In the last MONTH (on 0-10 scale) has pain interfered with the following?  1. General activity like being  able to carry out your everyday physical activities such as walking, climbing stairs, carrying groceries, or moving a chair?  Rating(10)   +Driver, -BT, -Dye Allergies. 

## 2019-07-17 NOTE — Telephone Encounter (Signed)
Called patient to inform she needed a rheumatology

## 2019-07-17 NOTE — Telephone Encounter (Signed)
Patient called requesting status of neurology referral. Patient states PCP advice she would be sending a referral to neurology last week. Patient asked if the referral was placed and could only see referral that was placed by Dr. August Saucer back in Dec. Patient was given neurologist number to try to make an appointment and was advice that PCP had to send a new referral.   Please send referral to [facility]    Patient would like a call when referral has been placed   Please advice [phone]

## 2019-07-17 NOTE — Telephone Encounter (Signed)
Sent to PCP ?

## 2019-07-21 NOTE — Procedures (Signed)
Lumbar Facet Joint Intra-Articular Injection(s) with Fluoroscopic Guidance  Patient: [name]      Date of Birth: Aug 20, 1970 MRN: [id number] PCP: Grayce Sessions, NP      Visit Date: 07/17/2019   Universal Protocol:    Date/Time: 07/17/2019  Consent Given By: the patient  Position: PRONE   Additional Comments: Vital signs were monitored before and after the procedure. Patient was prepped and draped in the usual sterile fashion. The correct patient, procedure, and site was verified.   Injection Procedure Details:  Procedure Site One Meds Administered: No orders of the defined types were placed in this encounter.    Laterality: Bilateral  Location/Site:  L4-L5  Needle size: 22 guage  Needle type: Spinal  Needle Placement: Articular  Findings:  -Comments: Excellent flow of contrast producing a partial arthrogram.  Procedure Details: The fluoroscope beam is vertically oriented in AP, and the inferior recess is visualized beneath the lower pole of the inferior apophyseal process, which represents the target point for needle insertion. When direct visualization is difficult the target point is located at the medial projection of the vertebral pedicle. The region overlying each aforementioned target is locally anesthetized with a 1 to 2 ml. volume of 1% Lidocaine without Epinephrine.   The spinal needle was inserted into each of the above mentioned facet joints using biplanar fluoroscopic guidance. A 0.25 to 0.5 ml. volume of Isovue-250 was injected and a partial facet joint arthrogram was obtained. A single spot film was obtained of the resulting arthrogram.    One to 1.25 ml of the steroid/anesthetic solution was then injected into each of the facet joints noted above.   Additional Comments:  The patient tolerated the procedure well Dressing: 2 x 2 sterile gauze and Band-Aid    Post-procedure details: Patient was observed during the procedure. Post-procedure  instructions were reviewed.  Patient left the clinic in stable condition.

## 2019-07-21 NOTE — Progress Notes (Signed)
[name] - 49 y.o. female MRN [id number]  Date of birth: [birth year]  Office Visit Note: Visit Date: 07/17/2019 PCP: Grayce Sessions, NP Referred by: Grayce Sessions, NP  Subjective: Chief Complaint  Patient presents with  . Lower Back - Pain  . Right Leg - Pain  . Left Leg - Pain   HPI:  [name] is a 49 y.o. female who comes in today At the request of Dr. Burnard Bunting for interventional spine procedure.  Patient having low back and bilateral leg pain with paresthesia but situation is complicated with type 2 diabetes and anxiety.  We were unable to complete the last injection [name] anxiety about the shot itself.  We really were only able to get probably a quarter of an inch into the musculature before the patient wanted to stop.  We did provide Valium prior to the procedure but the patient did not take it.  She ended up taking some muscle relaxer before she came in.  She actually seems like she is ready to have the injection today.  We are going to complete diagnostic bilateral facet joint blocks to see if it will help.  Her MRI does not show any focal nerve compression or stenosis or disc herniation.  She has pretty significant facet arthritis.  ROS Otherwise per HPI.  Assessment & Plan: Visit Diagnoses:  1. Spondylosis without myelopathy or radiculopathy, lumbar region     Plan: No additional findings.   Meds & Orders: No orders of the defined types were placed in this encounter.   Orders Placed This Encounter  Procedures  . XR C-ARM NO REPORT  . Epidural Steroid injection    Follow-up: Return if symptoms worsen or fail to improve.   Procedures: No procedures performed  Lumbar Facet Joint Intra-Articular Injection(s) with Fluoroscopic Guidance  Patient: [name]      Date of Birth: [birth year] MRN: [id number] PCP: Grayce Sessions, NP      Visit Date: 07/17/2019   Universal Protocol:    Date/Time: 07/17/2019  Consent Given By: the  patient  Position: PRONE   Additional Comments: Vital signs were monitored before and after the procedure. Patient was prepped and draped in the usual sterile fashion. The correct patient, procedure, and site was verified.   Injection Procedure Details:  Procedure Site One Meds Administered: No orders of the defined types were placed in this encounter.    Laterality: Bilateral  Location/Site:  L4-L5  Needle size: 22 guage  Needle type: Spinal  Needle Placement: Articular  Findings:  -Comments: Excellent flow of contrast producing a partial arthrogram.  Procedure Details: The fluoroscope beam is vertically oriented in AP, and the inferior recess is visualized beneath the lower pole of the inferior apophyseal process, which represents the target point for needle insertion. When direct visualization is difficult the target point is located at the medial projection of the vertebral pedicle. The region overlying each aforementioned target is locally anesthetized with a 1 to 2 ml. volume of 1% Lidocaine without Epinephrine.   The spinal needle was inserted into each of the above mentioned facet joints using biplanar fluoroscopic guidance. A 0.25 to 0.5 ml. volume of Isovue-250 was injected and a partial facet joint arthrogram was obtained. A single spot film was obtained of the resulting arthrogram.    One to 1.25 ml of the steroid/anesthetic solution was then injected into each of the facet joints noted above.   Additional Comments:  The patient tolerated the procedure  well Dressing: 2 x 2 sterile gauze and Band-Aid    Post-procedure details: Patient was observed during the procedure. Post-procedure instructions were reviewed.  Patient left the clinic in stable condition.      Clinical History: MRI LUMBAR SPINE WITHOUT CONTRAST  TECHNIQUE: Multiplanar, multisequence MR imaging of the lumbar spine was performed. No intravenous contrast was  administered.  COMPARISON:  Plain films lumbar spine 02/17/2019.  FINDINGS: Segmentation:  Standard.  Alignment:  Normal.  Vertebrae: No fracture, evidence of discitis, or worrisome bone lesion. Small hemangioma in T12 incidentally noted.  Conus medullaris and cauda equina: Conus extends to the L1 level. Conus and cauda equina appear normal.  Paraspinal and other soft tissues: Negative.  Disc levels:  T11-12 is imaged in the sagittal plane only and negative.  T12-L1: Negative.  L1-2: Mild facet degenerative change. Otherwise negative.  L2-3: Mild facet degenerative change and ligamentum flavum thickening. Otherwise negative.  L3-4: Negative.  L4-5: Mild-to-moderate facet degenerative change. There is a very shallow disc bulge to the right. No stenosis.  L5-S1: Bilateral facet degenerative change is more notable on the right. No disc bulge or protrusion. No stenosis.  IMPRESSION: Negative for central canal or foraminal stenosis with only a minimal disc bulge seen at L4-5. Intervertebral discs are otherwise normal.  Scattered facet arthropathy appears worst on the right at L4-5 and L5-S1.   Electronically Signed   By: Inge Rise M.D.   On: 04/19/2019 15:49     Objective:  VS:  HT:    WT:   BMI:     BP:(!) 156/99  HR:91bpm  TEMP: ( )  RESP:  Physical Exam  Ortho Exam Imaging: No results found.

## 2019-07-22 ENCOUNTER — Other Ambulatory Visit (INDEPENDENT_AMBULATORY_CARE_PROVIDER_SITE_OTHER): Payer: Self-pay | Admitting: Primary Care

## 2019-07-22 DIAGNOSIS — E119 Type 2 diabetes mellitus without complications: Secondary | ICD-10-CM

## 2019-07-24 ENCOUNTER — Ambulatory Visit (INDEPENDENT_AMBULATORY_CARE_PROVIDER_SITE_OTHER): Payer: Medicaid Other | Admitting: Primary Care

## 2019-08-07 ENCOUNTER — Encounter (HOSPITAL_COMMUNITY): Payer: Self-pay

## 2019-08-07 ENCOUNTER — Other Ambulatory Visit: Payer: Self-pay

## 2019-08-07 ENCOUNTER — Emergency Department (HOSPITAL_COMMUNITY)
Admission: EM | Admit: 2019-08-07 | Discharge: 2019-08-08 | Disposition: A | Discharge status: Home / Self Care | Payer: Medicaid Other | Attending: Emergency Medicine | Admitting: Emergency Medicine

## 2019-08-07 DIAGNOSIS — I10 Essential (primary) hypertension: Secondary | ICD-10-CM | POA: Insufficient documentation

## 2019-08-07 DIAGNOSIS — E119 Type 2 diabetes mellitus without complications: Secondary | ICD-10-CM | POA: Insufficient documentation

## 2019-08-07 DIAGNOSIS — Z7982 Long term (current) use of aspirin: Secondary | ICD-10-CM | POA: Insufficient documentation

## 2019-08-07 DIAGNOSIS — R070 Pain in throat: Secondary | ICD-10-CM | POA: Diagnosis present

## 2019-08-07 DIAGNOSIS — F17210 Nicotine dependence, cigarettes, uncomplicated: Secondary | ICD-10-CM | POA: Insufficient documentation

## 2019-08-07 DIAGNOSIS — Z794 Long term (current) use of insulin: Secondary | ICD-10-CM | POA: Diagnosis not present

## 2019-08-07 DIAGNOSIS — J029 Acute pharyngitis, unspecified: Secondary | ICD-10-CM | POA: Diagnosis not present

## 2019-08-07 DIAGNOSIS — Z79899 Other long term (current) drug therapy: Secondary | ICD-10-CM | POA: Diagnosis not present

## 2019-08-07 LAB — GROUP A STREP BY PCR: Group A Strep by PCR: NOT DETECTED

## 2019-08-07 MED ORDER — ACETAMINOPHEN 500 MG PO TABS
500.0000 mg | ORAL_TABLET | Freq: Once | ORAL | Status: AC
  Administered 2019-08-07: 23:00:00 500 mg via ORAL
  Filled 2019-08-07: qty 1

## 2019-08-07 MED ORDER — NAPROXEN 375 MG PO TABS
375.0000 mg | ORAL_TABLET | Freq: Once | ORAL | Status: AC
  Administered 2019-08-07: 375 mg via ORAL
  Filled 2019-08-07: qty 1

## 2019-08-07 MED ORDER — NAPROXEN 375 MG PO TABS: 375.0000 mg | ORAL_TABLET | Freq: Two times a day (BID) | ORAL | 0 refills | Status: DC

## 2019-08-07 NOTE — Discharge Instructions (Addendum)
Per our discussion, I am prescribing you naproxen for management of your pain.  You can also take Tylenol as needed.  You can try hot tea with honey to help coat the throat.  I would also recommend Chloraseptic max sore throat spray.  You can buy this at your local pharmacy.  Additionally consider Delsym for cough.  Continue using throat lozenges as needed.  Please do not hesitate to return to the emergency department with any new or worsening symptoms.  Please follow-up with your primary care provider regarding this visit.

## 2019-08-07 NOTE — ED Provider Notes (Signed)
[facility] DEPT Provider Note   CSN: [id number] Arrival date & time: 08/07/19  2154     History Chief Complaint  Patient presents with  . Sore Throat    [name] is a 49 y.o. female.  HPI HPI Comments: [name] is a 49 y.o. female who presents to the Emergency Department complaining of sore throat.  Patient states she has been experiencing 5 days of sore throat.  Her pain worsens when swallowing.  She has been taking OTC pain relievers as well as multiple allergy medications without significant relief.  She reports associated congestion and a mild cough.  She denies fevers, chills, chest pain, shortness of breath, abdominal pain, nausea, vomiting, diarrhea.     Past Medical History:  Diagnosis Date  . Anxiety   . Common migraine with intractable migraine 10/02/2016  . Depression   . Diabetes mellitus   . Hypertension     Patient Active Problem List   Diagnosis Date Noted  . Hot flashes 10/02/2018  . Vaginal dryness 10/02/2018  . BIH (benign intracranial hypertension) 11/06/2017  . Somnolence, daytime 07/30/2017  . Snoring 07/30/2017  . Morbid obesity ([place]) 07/30/2017  . Migraine 10/02/2016  . Common migraine with intractable migraine 10/02/2016  . Type 2 diabetes mellitus without complication, without long-term current use of insulin ([place]) 08/17/2016    Past Surgical History:  Procedure Laterality Date  . BTL       OB History    Gravida  5   Para  3   Term  3   Preterm      AB  2   Living  3     SAB  1   TAB  1   Ectopic      Multiple      Live Births              Family History  Problem Relation Age of Onset  . Diabetes Mother   . Hypertension Mother   . Renal Disease Mother   . Cancer Father     Social History   Tobacco Use  . Smoking status: Current Every Day Smoker    Packs/day: 0.24    Types: Cigarettes  . Smokeless tobacco: Never Used  Substance Use Topics  . Alcohol use: No    . Drug use: No    Home Medications Prior to Admission medications   Medication Sig Start Date End Date Taking? Authorizing Provider  aspirin EC 81 MG tablet Take 81 mg by mouth every 6 (six) hours as needed for mild pain.     [provider]  celecoxib (CELEBREX) 200 MG capsule Take 1 capsule (200 mg total) by mouth 2 (two) times daily. 06/23/19   Kerin Perna, NP  EASY COMFORT PEN NEEDLES 31G X 5 MM MISC USE AS INSTRUCTED. TWICE DAILY. 07/22/19   Kerin Perna, NP  fluticasone (FLOVENT HFA) 44 MCG/ACT inhaler Inhale 2 puffs into the lungs 2 (two) times daily. 01/19/19   Kerin Perna, NP  furosemide (LASIX) 20 MG tablet TAKE 2 TABLETS DAILY Patient taking differently: Take 40 mg by mouth every evening.  03/10/19   Ward Givens, NP  glimepiride (AMARYL) 4 MG tablet Take 1 tablet (4 mg total) by mouth daily before breakfast. 06/23/19   Kerin Perna, NP  glucose blood (ACCU-CHEK AVIVA PLUS) test strip Use as instructed 01/02/18   Clent Demark, PA-C  HYDROcodone-acetaminophen (NORCO/VICODIN) 5-325 MG tablet Take 1 tablet by mouth  at bedtime as needed for moderate pain. 07/02/19   Magnant, Charles L, PA-C  insulin aspart (NOVOLOG) 100 UNIT/ML injection Inject 10 Units into the skin 3 (three) times daily before meals. 06/23/19   Grayce Sessions, NP  insulin detemir (LEVEMIR FLEXTOUCH) 100 UNIT/ML FlexPen INJECT 50 UNITS INTO THE SKIN 2 (TWO) TIMES DAILY. 06/23/19   Grayce Sessions, NP  Lancets (ACCU-CHEK SOFT TOUCH) lancets Use as instructed 08/01/18   Grayce Sessions, NP  losartan (COZAAR) 25 MG tablet Take 1 tablet (25 mg total) by mouth daily. 06/23/19   Grayce Sessions, NP  metFORMIN (GLUCOPHAGE) 1000 MG tablet Take 1 tablet (1,000 mg total) by mouth 2 (two) times daily with a meal. 06/23/19   Grayce Sessions, NP  pantoprazole (PROTONIX) 40 MG tablet Take 1 tablet (40 mg total) by mouth daily. 05/08/19   Grayce Sessions, NP  pravastatin  (PRAVACHOL) 10 MG tablet Take 1 tablet (10 mg total) by mouth daily. 06/23/19   Grayce Sessions, NP  predniSONE (DELTASONE) 10 MG tablet Take 1 tablet (10 mg total) by mouth daily with breakfast. Take 6 pill every AM for 4 days than take 4 pills every AM for 4 days  than take 2 pills every AM for 4 days than take 1 pill every AM 07/10/19   Grayce Sessions, NP  pregabalin (LYRICA) 25 MG capsule Take 25 mg by mouth 2 (two) times daily as needed (pain).  05/08/19   [provider]  PROVENTIL HFA 108 (90 Base) MCG/ACT inhaler INHALE 1 TO 2 PUFFS BY MOUTH EVERY 6 (SIX) HOURS AS NEEDED FOR WHEEZING OR SHORTNESS OF BREATH. Patient taking differently: Inhale 1-2 puffs into the lungs every 6 (six) hours as needed for wheezing or shortness of breath.  01/10/19   Grayce Sessions, NP    Allergies    Lisinopril  Review of Systems   Review of Systems  Constitutional: Negative for chills and fever.  HENT: Positive for congestion and sore throat. Negative for postnasal drip, rhinorrhea, trouble swallowing and voice change.   Respiratory: Positive for cough. Negative for shortness of breath and wheezing.   Cardiovascular: Negative for chest pain.  Gastrointestinal: Negative for abdominal pain, diarrhea, nausea and vomiting.   Physical Exam Updated Vital Signs BP (!) 170/95 (BP Location: Right Arm)   Pulse 98   Temp 98 F (36.7 C) (Oral)   Resp 18   SpO2 100%   Physical Exam Vitals and nursing note reviewed.  Constitutional:      General: She is not in acute distress.    Appearance: She is well-developed. She is not ill-appearing, toxic-appearing or diaphoretic.  HENT:     Head: Normocephalic and atraumatic.     Right Ear: Ear canal normal. No drainage, swelling or tenderness. A middle ear effusion is present. Tympanic membrane is not erythematous.     Left Ear: Tympanic membrane and ear canal normal. No drainage, swelling or tenderness.  No middle ear effusion. Tympanic membrane is  not erythematous.     Nose: Congestion present. No rhinorrhea.     Mouth/Throat:     Mouth: Mucous membranes are moist. No oral lesions.     Pharynx: Oropharynx is clear. Uvula midline. Posterior oropharyngeal erythema present. No pharyngeal swelling, oropharyngeal exudate or uvula swelling.     Tonsils: No tonsillar exudate or tonsillar abscesses. 0 on the right. 0 on the left.     Comments: Diffuse erythema without exudates noted in the posterior  oropharynx.  Uvula is midline.  Patient is readily handling secretions.  No hot potato voice.  No cervical lymphadenopathy.  Submental and submandibular tissue is nontender and supple. Eyes:     Extraocular Movements:     Right eye: Normal extraocular motion.     Left eye: Normal extraocular motion.     Conjunctiva/sclera: Conjunctivae normal.     Pupils: Pupils are equal, round, and reactive to light.  Cardiovascular:     Rate and Rhythm: Normal rate and regular rhythm.     Heart sounds: Normal heart sounds. No murmur. No friction rub. No gallop.   Pulmonary:     Effort: Pulmonary effort is normal. No respiratory distress.     Breath sounds: Normal breath sounds. No stridor. No wheezing, rhonchi or rales.  Abdominal:     Palpations: Abdomen is soft.     Tenderness: There is no abdominal tenderness.  Musculoskeletal:     Cervical back: Normal range of motion and neck supple.  Lymphadenopathy:     Cervical: No cervical adenopathy.  Skin:    General: Skin is warm and dry.     Capillary Refill: Capillary refill takes less than 2 seconds.  Neurological:     General: No focal deficit present.     Mental Status: She is alert and oriented to person, place, and time.  Psychiatric:        Mood and Affect: Mood normal.        Behavior: Behavior normal.    ED Results / Procedures / Treatments   Labs (all labs ordered are listed, but only abnormal results are displayed) Labs Reviewed  GROUP A STREP BY PCR    EKG None  Radiology No  results found.  Procedures Procedures (including critical care time)  Medications Ordered in ED Medications  naproxen (NAPROSYN) tablet 375 mg (375 mg Oral Given 08/07/19 2317)  acetaminophen (TYLENOL) tablet 500 mg (500 mg Oral Given 08/07/19 2317)    ED Course  I have reviewed the triage vital signs and the nursing notes.  Pertinent labs & imaging results that were available during my care of the patient were reviewed by me and considered in my medical decision making (see chart for details).    MDM Rules/Calculators/A&P                      11:00 PM patient is a 49 year old female with 5 days of sore throat.  Physical exam is reassuring.  No hot potato voice.  Uvula is midline.  Nonconcerning exam for Ludwig's angina or PTA.  Nursing staff obtained rapid strep.  Will give Tylenol and naproxen for pain.  Will reassess.  12:01 AM rapid strep test is negative.  I discussed this with the patient. I recommended she continue using cough drops but also recommended hot tea with honey.  She can purchase Chloraseptic rinse and sprays.  Delsym for cough. Tylenol and naproxen for pain.  I gave her a prescription for naproxen.  I additionally recommended TheraFlu flu and sore throat.  She understands she can purchase all of these medications at her local pharmacy.  She understands to return to the emergency department with any new or worsening symptoms.  She verbalized understanding of the above plan was amicable the time of discharge.  Final Clinical Impression(s) / ED Diagnoses Final diagnoses:  Acute pharyngitis, unspecified etiology    Rx / DC Orders ED Discharge Orders         Ordered  naproxen (NAPROSYN) 375 MG tablet  2 times daily     08/07/19 2359           Placido Sou, PA-C 08/08/19 0007    Geoffery Lyons, MD 08/08/19 [phone]

## 2019-08-07 NOTE — ED Triage Notes (Signed)
Sore throat for 3 days. Hurts to swallow.

## 2019-08-22 ENCOUNTER — Other Ambulatory Visit (INDEPENDENT_AMBULATORY_CARE_PROVIDER_SITE_OTHER): Payer: Self-pay | Admitting: Primary Care

## 2019-08-22 DIAGNOSIS — E119 Type 2 diabetes mellitus without complications: Secondary | ICD-10-CM

## 2019-09-09 ENCOUNTER — Other Ambulatory Visit: Payer: Self-pay

## 2019-09-09 ENCOUNTER — Encounter (HOSPITAL_COMMUNITY): Payer: Self-pay | Admitting: Orthopedic Surgery

## 2019-09-09 ENCOUNTER — Ambulatory Visit (HOSPITAL_COMMUNITY)
Admission: EM | Admit: 2019-09-09 | Discharge: 2019-09-09 | Disposition: A | Discharge status: Home / Self Care | Payer: Medicaid Other | Attending: Nurse Practitioner | Admitting: Nurse Practitioner

## 2019-09-09 DIAGNOSIS — Z20822 Contact with and (suspected) exposure to covid-19: Secondary | ICD-10-CM | POA: Diagnosis not present

## 2019-09-09 NOTE — Discharge Instructions (Signed)
Drink plenty of fluids. Stay in home isolation until you receive results of your COVID test. You will only be notified for positive results. You may go online to MyChart in the next few days and review your results. Please follow CDC guidelines that are attached.

## 2019-09-09 NOTE — ED Triage Notes (Signed)
Pt was exposed to client who tested positive for  Covid 2 days ago,  Pt has been with pt all week.   Pt reports body aches and cough X2 days.  Denies fever, SOB, N/V/D.  No OTC for symptoms.

## 2019-09-09 NOTE — ED Provider Notes (Signed)
[facility]    CSN: [id number] Arrival date & time: 09/09/19  7989      History   Chief Complaint No chief complaint on file.   HPI [name] is a 49 y.o. female.   Subjective:   [name] is a 49 y.o. female who presents for evaluation after exposure to COVID-19. Last encounter was on 09/08/19. The person tested positive for COVID-19 over the weekend (08/13/27-5/30). Patient currently denies any symptoms. Specifically, no fevers, chills, sweats, sore throat, congestion, cough, shortness of breath, nausea, vomiting, diarrhea, headache, dizziness or change in taste/smell.  High risk factors for COVID complications include: co-morbid illness (obesity and diabetes II).  The following portions of the patient's history were reviewed and updated as appropriate: allergies, current medications, past family history, past medical history, past social history, past surgical history and problem list.             Past Medical History:  Diagnosis Date  . Anxiety   . Common migraine with intractable migraine 10/02/2016  . Depression   . Diabetes mellitus   . Hypertension     Patient Active Problem List   Diagnosis Date Noted  . Hot flashes 10/02/2018  . Vaginal dryness 10/02/2018  . BIH (benign intracranial hypertension) 11/06/2017  . Somnolence, daytime 07/30/2017  . Snoring 07/30/2017  . Morbid obesity (HCC) 07/30/2017  . Migraine 10/02/2016  . Common migraine with intractable migraine 10/02/2016  . Type 2 diabetes mellitus without complication, without long-term current use of insulin (HCC) 08/17/2016    Past Surgical History:  Procedure Laterality Date  . BTL      OB History    Gravida  5   Para  3   Term  3   Preterm      AB  2   Living  3     SAB  1   TAB  1   Ectopic      Multiple      Live Births               Home Medications    Prior to Admission medications   Medication Sig Start Date End Date Taking? Authorizing  Provider  celecoxib (CELEBREX) 200 MG capsule TAKE 1 CAPSULE BY MOUTH 2 TIMES DAILY. 08/22/19  Yes Hoy Register, MD  fluticasone (FLOVENT HFA) 44 MCG/ACT inhaler Inhale 2 puffs into the lungs 2 (two) times daily. 01/19/19  Yes Grayce Sessions, NP  furosemide (LASIX) 20 MG tablet TAKE 2 TABLETS DAILY Patient taking differently: Take 40 mg by mouth every evening.  03/10/19  Yes Millikan, Megan, NP  glimepiride (AMARYL) 4 MG tablet TAKE 1 TABLET BY MOUTH DAILY BEFORE BREAKFAST. 08/22/19  Yes Hoy Register, MD  HYDROcodone-acetaminophen (NORCO/VICODIN) 5-325 MG tablet Take 1 tablet by mouth at bedtime as needed for moderate pain. 07/02/19  Yes Magnant, Charles L, PA-C  insulin aspart (NOVOLOG) 100 UNIT/ML injection Inject 10 Units into the skin 3 (three) times daily before meals. 06/23/19  Yes Edwards, Michelle P, NP  insulin detemir (LEVEMIR FLEXTOUCH) 100 UNIT/ML FlexPen INJECT 50 UNITS INTO THE SKIN 2 (TWO) TIMES DAILY. 06/23/19  Yes Grayce Sessions, NP  losartan (COZAAR) 25 MG tablet Take 1 tablet (25 mg total) by mouth daily. 06/23/19  Yes Grayce Sessions, NP  metFORMIN (GLUCOPHAGE) 1000 MG tablet TAKE 1 TABLET BY MOUTH 2 TIMES DAILY WITH A MEAL. 08/22/19  Yes Hoy Register, MD  naproxen (NAPROSYN) 375 MG tablet Take 1 tablet (375 mg  total) by mouth 2 (two) times daily. 08/07/19  Yes Rayna Sexton, PA-C  pantoprazole (PROTONIX) 40 MG tablet Take 1 tablet (40 mg total) by mouth daily. 05/08/19  Yes Kerin Perna, NP  pravastatin (PRAVACHOL) 10 MG tablet Take 1 tablet (10 mg total) by mouth daily. 06/23/19  Yes Kerin Perna, NP  predniSONE (DELTASONE) 10 MG tablet Take 1 tablet (10 mg total) by mouth daily with breakfast. Take 6 pill every AM for 4 days than take 4 pills every AM for 4 days  than take 2 pills every AM for 4 days than take 1 pill every AM 07/10/19  Yes Kerin Perna, NP  pregabalin (LYRICA) 25 MG capsule Take 25 mg by mouth 2 (two) times daily as needed  (pain).  05/08/19  Yes [provider]  aspirin EC 81 MG tablet Take 81 mg by mouth every 6 (six) hours as needed for mild pain.     [provider]  EASY COMFORT PEN NEEDLES 31G X 5 MM MISC USE AS INSTRUCTED. TWICE DAILY. 07/22/19   Kerin Perna, NP  glucose blood (ACCU-CHEK AVIVA PLUS) test strip Use as instructed 01/02/18   Clent Demark, PA-C  Lancets [facility] SOFT [facility]) lancets Use as instructed 08/01/18   Kerin Perna, NP  NOVOLOG FLEXPEN 100 UNIT/ML FlexPen SMARTSIG:10 Unit(s) SUB-Q 3 Times Daily 07/31/19   [provider]  PROVENTIL HFA 108 (90 Base) MCG/ACT inhaler INHALE 1 TO 2 PUFFS BY MOUTH EVERY 6 (SIX) HOURS AS NEEDED FOR WHEEZING OR SHORTNESS OF BREATH. Patient taking differently: Inhale 1-2 puffs into the lungs every 6 (six) hours as needed for wheezing or shortness of breath.  01/10/19   Kerin Perna, NP    Family History Family History  Problem Relation Age of Onset  . Diabetes Mother   . Hypertension Mother   . Renal Disease Mother   . Cancer Father     Social History Social History   Tobacco Use  . Smoking status: Current Every Day Smoker    Packs/day: 0.24    Types: Cigarettes  . Smokeless tobacco: Never Used  Substance Use Topics  . Alcohol use: No  . Drug use: No     Allergies   Lisinopril   Review of Systems Review of Systems   Physical Exam Triage Vital Signs ED Triage Vitals  Enc Vitals Group     BP 09/09/19 0939 136/84     Pulse Rate 09/09/19 0939 82     Resp --      Temp 09/09/19 0939 97.8 F (36.6 C)     Temp Source 09/09/19 0939 Oral     SpO2 09/09/19 0939 99 %     Weight --      Height --      Head Circumference --      Peak Flow --      Pain Score 09/09/19 0940 0     Pain Loc --      Pain Edu? --      Excl. in [place]? --    No data found.  Updated Vital Signs BP 136/84 (BP Location: Left Arm)   Pulse 82   Temp 97.8 F (36.6 C) (Oral)   SpO2 99%   Visual Acuity Right Eye  Distance:   Left Eye Distance:   Bilateral Distance:    Right Eye Near:   Left Eye Near:    Bilateral Near:     Physical Exam   UC  Treatments / Results  Labs (all labs ordered are listed, but only abnormal results are displayed) Labs Reviewed  SARS CORONAVIRUS 2 (TAT 6-24 HRS)    EKG   Radiology No results found.  Procedures Procedures (including critical care time)  Medications Ordered in UC Medications - No data to display  Initial Impression / Assessment and Plan / UC Course  I have reviewed the triage vital signs and the nursing notes.  Pertinent labs & imaging results that were available during my care of the patient were reviewed by me and considered in my medical decision making (see chart for details).    49 year old female with a history of type 2 diabetes and obesity presenting with exposure to COVID-19 outside the home.  Patient currently denies any symptoms.  COVID-19 test pending.  Patient advised to stay in home isolation for the next 14 days.  Informed that symptoms may develop 2 to 14 days from day of exposure.  No need to retest if Covid test is initially negative as she is expected to isolate for 2 weeks.  Patient advised supportive care if symptoms should develop and discussed and occasions for immediate ED follow-up.  Today's evaluation has revealed no signs of a dangerous process. Discussed diagnosis with patient and/or guardian. Patient and/or guardian aware of their diagnosis, possible red flag symptoms to watch out for and need for close follow up. Patient and/or guardian understands verbal and written discharge instructions. Patient and/or guardian comfortable with plan and disposition.  Patient and/or guardian has a clear mental status at this time, good insight into illness (after discussion and teaching) and has clear judgment to make decisions regarding their care  This care was provided during an unprecedented National Emergency due to the Novel  Coronavirus (COVID-19) pandemic. COVID-19 infections and transmission risks place heavy strains on healthcare resources.  As this pandemic evolves, our facility, providers, and staff strive to respond fluidly, to remain operational, and to provide care relative to available resources and information. Outcomes are unpredictable and treatments are without well-defined guidelines. Further, the impact of COVID-19 on all aspects of urgent care, including the impact to patients seeking care for reasons other than COVID-19, is unavoidable during this national emergency. At this time of the global pandemic, management of patients has significantly changed, even for non-COVID positive patients given high local and regional COVID volumes at this time requiring high healthcare system and resource utilization. The standard of care for management of both COVID suspected and non-COVID suspected patients continues to change rapidly at the local, regional, national, and global levels. This patient was worked up and treated to the best available but ever changing evidence and resources available at this current time.   Documentation was completed with the aid of voice recognition software. Transcription may contain typographical errors. Final Clinical Impressions(s) / UC Diagnoses   Final diagnoses:  Contact with and (suspected) exposure to covid-19  Encounter for screening laboratory testing for COVID-19 virus     Discharge Instructions     Drink plenty of fluids. Stay in home isolation until you receive results of your COVID test. You will only be notified for positive results. You may go online to MyChart in the next few days and review your results. Please follow CDC guidelines that are attached.     ED Prescriptions    None     PDMP not reviewed this encounter.   Lurline Idol, [place] 09/09/19 1005

## 2019-09-10 LAB — SARS CORONAVIRUS 2 (TAT 6-24 HRS): SARS Coronavirus 2: NEGATIVE

## 2019-09-13 ENCOUNTER — Other Ambulatory Visit: Payer: Self-pay

## 2019-09-13 ENCOUNTER — Encounter (HOSPITAL_COMMUNITY): Payer: Self-pay

## 2019-09-13 ENCOUNTER — Emergency Department (HOSPITAL_COMMUNITY)
Admission: EM | Admit: 2019-09-13 | Discharge: 2019-09-13 | Disposition: A | Discharge status: Home / Self Care | Payer: Medicaid Other | Attending: Emergency Medicine | Admitting: Emergency Medicine

## 2019-09-13 ENCOUNTER — Ambulatory Visit (HOSPITAL_COMMUNITY)
Admission: EM | Admit: 2019-09-13 | Discharge: 2019-09-13 | Disposition: A | Discharge status: Home / Self Care | Payer: Medicaid Other | Attending: Family Medicine | Admitting: Family Medicine

## 2019-09-13 ENCOUNTER — Encounter (HOSPITAL_COMMUNITY): Payer: Self-pay | Admitting: Emergency Medicine

## 2019-09-13 DIAGNOSIS — S0993XA Unspecified injury of face, initial encounter: Secondary | ICD-10-CM

## 2019-09-13 DIAGNOSIS — Z5321 Procedure and treatment not carried out due to patient leaving prior to being seen by health care provider: Secondary | ICD-10-CM | POA: Diagnosis not present

## 2019-09-13 DIAGNOSIS — Z0489 Encounter for examination and observation for other specified reasons: Secondary | ICD-10-CM | POA: Diagnosis not present
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
## 2019-09-13 NOTE — ED Notes (Signed)
Pt called multiple times no response  
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
## 2019-09-13 NOTE — ED Notes (Signed)
Patient is being discharged from the Urgent Care and sent to the Emergency Department via private vehicle . Per dr hagler, patient is in need of higher level of care due to nature of injury. Patient is aware and verbalizes understanding of plan of care.  Vitals:   09/13/19 1716  BP: (!) 130/95  Pulse: (!) 104  Resp: 18  Temp: 98.5 F (36.9 C)  SpO2: 100%
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
## 2019-09-17 ENCOUNTER — Other Ambulatory Visit (INDEPENDENT_AMBULATORY_CARE_PROVIDER_SITE_OTHER): Payer: Self-pay | Admitting: Primary Care

## 2019-09-17 DIAGNOSIS — K219 Gastro-esophageal reflux disease without esophagitis: Secondary | ICD-10-CM

## 2019-09-17 NOTE — Telephone Encounter (Signed)
Sent to PCP ?

## 2019-09-22 ENCOUNTER — Other Ambulatory Visit (INDEPENDENT_AMBULATORY_CARE_PROVIDER_SITE_OTHER): Payer: Self-pay | Admitting: Primary Care

## 2019-10-01 ENCOUNTER — Ambulatory Visit (INDEPENDENT_AMBULATORY_CARE_PROVIDER_SITE_OTHER): Payer: Medicaid Other | Admitting: Primary Care

## 2019-10-01 ENCOUNTER — Encounter (INDEPENDENT_AMBULATORY_CARE_PROVIDER_SITE_OTHER): Payer: Self-pay | Admitting: Primary Care

## 2019-10-01 ENCOUNTER — Other Ambulatory Visit: Payer: Self-pay

## 2019-10-01 VITALS — BP 131/82 | HR 81 | Temp 98.2°F | Ht 65.5 in | Wt 259.0 lb

## 2019-10-01 DIAGNOSIS — G8929 Other chronic pain: Secondary | ICD-10-CM | POA: Diagnosis not present

## 2019-10-01 DIAGNOSIS — E119 Type 2 diabetes mellitus without complications: Secondary | ICD-10-CM

## 2019-10-01 DIAGNOSIS — M25562 Pain in left knee: Secondary | ICD-10-CM | POA: Diagnosis not present

## 2019-10-01 DIAGNOSIS — M25561 Pain in right knee: Secondary | ICD-10-CM

## 2019-10-01 LAB — POCT GLYCOSYLATED HEMOGLOBIN (HGB A1C): Hemoglobin A1C: 9.8 % — AB (ref 4.0–5.6)

## 2019-10-01 LAB — GLUCOSE, POCT (MANUAL RESULT ENTRY): POC Glucose: 215 mg/dl — AB (ref 70–99)

## 2019-10-01 NOTE — Progress Notes (Signed)
Established Patient Office Visit  Subjective:  Patient ID: [name], female    DOB: January 11, 1971  Age: 49 y.o. MRN: [id number]  CC:  Chief Complaint  Patient presents with  . Diabetes  . Leg Pain    bilateral     HPI [name] is a 49 year old morbid obese female is in today for increasing body pain started Friday 09/26/2019 woke up in this pain 10/10 using celebrex 100mg  twice a day . States right knee give out at anytime falls x 3.    Past Medical History:  Diagnosis Date  . Anxiety   . Common migraine with intractable migraine 10/02/2016  . Depression   . Diabetes mellitus   . Hypertension     Past Surgical History:  Procedure Laterality Date  . BTL      Family History  Problem Relation Age of Onset  . Diabetes Mother   . Hypertension Mother   . Renal Disease Mother   . Cancer Father     Social History   Socioeconomic History  . Marital status: Single    Spouse name: Not on file  . Number of children: Not on file  . Years of education: Not on file  . Highest education level: Not on file  Occupational History  . Not on file  Tobacco Use  . Smoking status: Current Every Day Smoker    Packs/day: 0.24    Types: Cigarettes  . Smokeless tobacco: Never Used  Vaping Use  . Vaping Use: Former  Substance and Sexual Activity  . Alcohol use: No  . Drug use: No  . Sexual activity: Yes    Birth control/protection: None  Other Topics Concern  . Not on file  Social History Narrative   Right handed    Lives with daughter   Caffeine use: Drinks coffee/tea/soda sometimes   Social Determinants of Health   Financial Resource Strain:   . Difficulty of Paying Living Expenses:   Food Insecurity:   . Worried About 10/04/2016 in the Last Year:   . Programme researcher, broadcasting/film/video in the Last Year:   Transportation Needs:   . Barista (Medical):   Freight forwarder Lack of Transportation (Non-Medical):   Physical Activity:   . Days of Exercise per Week:   .  Minutes of Exercise per Session:   Stress:   . Feeling of Stress :   Social Connections:   . Frequency of Communication with Friends and Family:   . Frequency of Social Gatherings with Friends and Family:   . Attends Religious Services:   . Active Member of Clubs or Organizations:   . Attends Marland Kitchen Meetings:   Banker Marital Status:   Intimate Partner Violence:   . Fear of Current or Ex-Partner:   . Emotionally Abused:   Marland Kitchen Physically Abused:   . Sexually Abused:     Outpatient Medications Prior to Visit  Medication Sig Dispense Refill  . aspirin EC 81 MG tablet Take 81 mg by mouth every 6 (six) hours as needed for mild pain.     . celecoxib (CELEBREX) 200 MG capsule TAKE 1 CAPSULE BY MOUTH 2 TIMES DAILY. 180 capsule 0  . EASY COMFORT PEN NEEDLES 31G X 5 MM MISC USE AS INSTRUCTED. TWICE DAILY. 100 each 1  . fluticasone (FLOVENT HFA) 44 MCG/ACT inhaler Inhale 2 puffs into the lungs 2 (two) times daily. 1 Inhaler 12  . furosemide (LASIX) 20 MG tablet TAKE  2 TABLETS DAILY (Patient taking differently: Take 40 mg by mouth every evening. ) 60 tablet 0  . glimepiride (AMARYL) 4 MG tablet TAKE 1 TABLET BY MOUTH DAILY BEFORE BREAKFAST. 90 tablet 0  . glucose blood (ACCU-CHEK AVIVA PLUS) test strip Use as instructed 100 each 12  . insulin aspart (NOVOLOG) 100 UNIT/ML injection Inject 10 Units into the skin 3 (three) times daily before meals. 10 mL 3  . insulin detemir (LEVEMIR FLEXTOUCH) 100 UNIT/ML FlexPen INJECT 50 UNITS INTO THE SKIN 2 (TWO) TIMES DAILY. 30 mL 3  . Lancets (ACCU-CHEK SOFT TOUCH) lancets Use as instructed 100 each 12  . losartan (COZAAR) 25 MG tablet Take 1 tablet (25 mg total) by mouth daily. 90 tablet 1  . metFORMIN (GLUCOPHAGE) 1000 MG tablet TAKE 1 TABLET BY MOUTH 2 TIMES DAILY WITH A MEAL. 180 tablet 0  . NOVOLOG FLEXPEN 100 UNIT/ML FlexPen SMARTSIG:10 Unit(s) SUB-Q 3 Times Daily    . pantoprazole (PROTONIX) 40 MG tablet TAKE 1 TABLET (40 MG TOTAL) BY MOUTH  DAILY. 30 tablet 3  . pravastatin (PRAVACHOL) 10 MG tablet Take 1 tablet (10 mg total) by mouth daily. 90 tablet 1  . PROVENTIL HFA 108 (90 Base) MCG/ACT inhaler INHALE 1 TO 2 PUFFS BY MOUTH EVERY 6 (SIX) HOURS AS NEEDED FOR WHEEZING OR SHORTNESS OF BREATH. (Patient taking differently: Inhale 1-2 puffs into the lungs every 6 (six) hours as needed for wheezing or shortness of breath. ) 6.7 g 1  . HYDROcodone-acetaminophen (NORCO/VICODIN) 5-325 MG tablet Take 1 tablet by mouth at bedtime as needed for moderate pain. 10 tablet 0  . naproxen (NAPROSYN) 375 MG tablet Take 1 tablet (375 mg total) by mouth 2 (two) times daily. 20 tablet 0  . pregabalin (LYRICA) 25 MG capsule Take 25 mg by mouth 2 (two) times daily as needed (pain).     . predniSONE (DELTASONE) 10 MG tablet Take 1 tablet (10 mg total) by mouth daily with breakfast. Take 6 pill every AM for 4 days than take 4 pills every AM for 4 days  than take 2 pills every AM for 4 days than take 1 pill every AM 80 tablet 0   No facility-administered medications prior to visit.    Allergies  Allergen Reactions  . Lisinopril     angioedema    ROS Review of Systems  Musculoskeletal: Positive for arthralgias.       Bilateral leg pain      Objective:    Physical Exam Vitals reviewed.  Constitutional:      Appearance: She is obese.  Cardiovascular:     Rate and Rhythm: Normal rate and regular rhythm.  Pulmonary:     Effort: Pulmonary effort is normal.     Breath sounds: Normal breath sounds.  Abdominal:     General: Bowel sounds are normal.  Musculoskeletal:        General: Swelling and tenderness present. Normal range of motion.     Cervical back: Normal range of motion and neck supple.     Comments: Bilateral knees Right leg is worse; especially around ankle 1+ edema  Skin:    General: Skin is warm and dry.  Neurological:     Mental Status: She is alert and oriented to person, place, and time.  Psychiatric:        Mood and  Affect: Mood normal.        Behavior: Behavior normal.        Thought Content: Thought content  normal.        Judgment: Judgment normal.     BP 131/82 (BP Location: Left Arm, Patient Position: Sitting, Cuff Size: Large)   Pulse 81   Temp 98.2 F (36.8 C) (Oral)   Ht 5' 5.5" (1.664 m)   Wt 259 lb (117.5 kg)   SpO2 95%   BMI 42.44 kg/m  Wt Readings from Last 3 Encounters:  10/14/19 250 lb (113.4 kg)  10/13/19 257 lb 15 oz (117 kg)  10/01/19 259 lb (117.5 kg)     Health Maintenance Due  Topic Date Due  . PNEUMOCOCCAL POLYSACCHARIDE VACCINE AGE 81-64 HIGH RISK  Never done  . FOOT EXAM  Never done  . COVID-19 Vaccine (1) Never done  . COLONOSCOPY  Never done  . OPHTHALMOLOGY EXAM  10/20/2018    There are no preventive care reminders to display for this patient.  Lab Results  Component Value Date   TSH 2.870 10/02/2018   Lab Results  Component Value Date   WBC 15.5 (H) 06/21/2019   HGB 14.8 06/21/2019   HCT 44.2 06/21/2019   MCV 75.2 (L) 06/21/2019   PLT 320 06/21/2019   Lab Results  Component Value Date   NA 143 06/21/2019   K 3.9 06/21/2019   CO2 26 06/21/2019   GLUCOSE 183 (H) 06/21/2019   BUN 15 06/21/2019   CREATININE 0.78 06/21/2019   BILITOT 0.5 06/21/2019   ALKPHOS 158 (H) 06/21/2019   AST 27 06/21/2019   ALT 51 (H) 06/21/2019   PROT 8.1 06/21/2019   ALBUMIN 4.1 06/21/2019   CALCIUM 9.7 06/21/2019   ANIONGAP 12 06/21/2019   Lab Results  Component Value Date   CHOL 185 08/01/2018   Lab Results  Component Value Date   HDL 40 08/01/2018   Lab Results  Component Value Date   LDLCALC 125 (H) 08/01/2018   Lab Results  Component Value Date   TRIG 102 08/01/2018   Lab Results  Component Value Date   CHOLHDL 4.6 (H) 08/01/2018   Lab Results  Component Value Date   HGBA1C 9.8 (A) 10/01/2019      Assessment & Plan:  Dakiya was seen today for diabetes and leg pain.  Diagnoses and all orders for this visit:  Type 2 diabetes mellitus  without complication, without long-term current use of insulin ([place]) Per patient she does admit to missing some doses of medication.  With discussion of complications related to uncontrolled diabetes loss of vision, renal failure needing hemodialysis, amputations she will take all medication as prescribed Amaryl 4 mg daily, Levemir 50 units twice daily, NovoLog 10 units with meals on follow-up visit will consider replacing with sliding scale insulin and Metformin at 1000 mg daily -     HgB A1c 9.8 previously 9 months ago A1C 6.5  -     Glucose (CBG)  Goal of therapy: Less than 6.5 hemoglobin A1c. Foods that are high in carbohydrates are the following rice, potatoes, breads, sugars, and pastas.  Reduction in the intake (eating) will assist in lowering your blood sugars.  Bilateral chronic knee pain Work on losing weight to help reduce joint pain. May alternate with heat and ice application for pain relief. May also alternate with acetaminophen and Ibuprofen as prescribed pain relief. Other alternatives include massage, acupuncture and water aerobics.  You must stay active and avoid a sedentary lifestyle.   Follow-up: Return in about 3 months (around 01/01/2020) for schedule appointment with Lurena Joiner PCP f/u 58months).  Kerin Perna, NP

## 2019-10-01 NOTE — Patient Instructions (Signed)

## 2019-10-03 ENCOUNTER — Telehealth: Payer: Self-pay

## 2019-10-03 ENCOUNTER — Ambulatory Visit (INDEPENDENT_AMBULATORY_CARE_PROVIDER_SITE_OTHER): Payer: Medicaid Other | Admitting: Surgical

## 2019-10-03 ENCOUNTER — Encounter: Payer: Self-pay | Admitting: Surgical

## 2019-10-03 DIAGNOSIS — M1711 Unilateral primary osteoarthritis, right knee: Secondary | ICD-10-CM | POA: Diagnosis not present

## 2019-10-03 MED ORDER — BUPIVACAINE HCL 0.25 % IJ SOLN
4.0000 mL | INTRAMUSCULAR | Status: AC | PRN
  Administered 2019-10-03: 4 mL via INTRA_ARTICULAR

## 2019-10-03 MED ORDER — METHYLPREDNISOLONE ACETATE 40 MG/ML IJ SUSP
40.0000 mg | INTRAMUSCULAR | Status: AC | PRN
  Administered 2019-10-03: 40 mg via INTRA_ARTICULAR

## 2019-10-03 MED ORDER — TRAMADOL HCL 50 MG PO TABS: 50.0000 mg | ORAL_TABLET | Freq: Every day | ORAL | 0 refills | Status: DC | PRN

## 2019-10-03 MED ORDER — LIDOCAINE HCL 1 % IJ SOLN
5.0000 mL | INTRAMUSCULAR | Status: AC | PRN
  Administered 2019-10-03: 5 mL

## 2019-10-03 NOTE — Progress Notes (Signed)
Office Visit Note   Patient: [name]           Date of Birth: July 24, 1970           MRN: [id number] Visit Date: 10/03/2019 Requested by: Grayce Sessions, NP [address] [place],  [place] [address] PCP: Grayce Sessions, NP  Subjective: Chief Complaint  Patient presents with  . Right Knee - Pain    HPI: [name] is a 49 y.o. female who presents to the office complaining of right knee pain.  Patient has a history of right knee osteoarthritis.  She is having severe pain that is making it difficult to walk.  She has no recent injuries.  She denies any groin pain.  She request cortisone injection today.  She has never had gel injections before.  The cortisone injections typically give her about 4 weeks of relief..                ROS:  All systems reviewed are negative as they relate to the chief complaint within the history of present illness.  Patient denies fevers or chills.  Assessment & Plan: Visit Diagnoses:  1. Unilateral primary osteoarthritis, right knee     Plan: Patient is a 49 year old female who presents complaining of right knee pain.  She has history of severe right knee osteoarthritis.  Symptoms are bad enough that she is considering surgery but she has never tried gel injections so will try cortisone injection today with approval for gel injections for when this cortisone injection wears off.  If those do not provide enough relief, we will consider surgical intervention with total knee arthroplasty.  Patient agreed with this plan.  Patient tolerated the right knee cortisone injection well without complication.  Follow-up when steroid injection wears off for gel.  This patient is diagnosed with osteoarthritis of the knee(s).    Radiographs show evidence of joint space narrowing, osteophytes, subchondral sclerosis and/or subchondral cysts.  This patient has knee pain which interferes with functional and activities of daily living.    This patient has  experienced inadequate response, adverse effects and/or intolerance with conservative treatments such as acetaminophen, NSAIDS, topical creams, physical therapy or regular exercise, knee bracing and/or weight loss.   This patient has experienced inadequate response or has a contraindication to intra articular steroid injections for at least 3 months.   This patient is not scheduled to have a total knee replacement within 6 months of starting treatment with viscosupplementation.   Follow-Up Instructions: No follow-ups on file.   Orders:  No orders of the defined types were placed in this encounter.  Meds ordered this encounter  Medications  . traMADol (ULTRAM) 50 MG tablet    Sig: Take 1 tablet (50 mg total) by mouth daily as needed.    Dispense:  20 tablet    Refill:  0      Procedures: Large Joint Inj: R knee on 10/03/2019 4:25 PM Indications: diagnostic evaluation, joint swelling and pain Details: 18 G 1.5 in needle, superolateral approach  Arthrogram: No  Medications: 5 mL lidocaine 1 %; 40 mg methylPREDNISolone acetate 40 MG/ML; 4 mL bupivacaine 0.25 % Outcome: tolerated well, no immediate complications Procedure, treatment alternatives, risks and benefits explained, specific risks discussed. Consent was given by the patient. Immediately prior to procedure a time out was called to verify the correct patient, procedure, equipment, support staff and site/side marked as required. Patient was prepped and draped in the usual sterile fashion.  Clinical Data: No additional findings.  Objective: Vital Signs: There were no vitals taken for this visit.  Physical Exam:  Constitutional: Patient appears well-developed HEENT:  Head: Normocephalic Eyes:EOM are normal Neck: Normal range of motion Cardiovascular: Normal rate Pulmonary/chest: Effort normal Neurologic: Patient is alert Skin: Skin is warm Psychiatric: Patient has normal mood and affect  Ortho Exam:  Right  knee Exam Trace effusion Extensor mechanism intact Tender to palpation over the medial and lateral joint lines.  Tender to palpation over the pes anserine bursa. No TTP over quad tendon, patellar tendon, patella, tibial tubercle, LCL/MCL insertions Extension to 5 degrees Flexion > 90 degrees   Specialty Comments:  No specialty comments available.  Imaging: No results found.   PMFS History: Patient Active Problem List   Diagnosis Date Noted  . Hot flashes 10/02/2018  . Vaginal dryness 10/02/2018  . BIH (benign intracranial hypertension) 11/06/2017  . Somnolence, daytime 07/30/2017  . Snoring 07/30/2017  . Morbid obesity ([facility]) 07/30/2017  . Migraine 10/02/2016  . Common migraine with intractable migraine 10/02/2016  . Type 2 diabetes mellitus without complication, without long-term current use of insulin ([facility]) 08/17/2016   Past Medical History:  Diagnosis Date  . Anxiety   . Common migraine with intractable migraine 10/02/2016  . Depression   . Diabetes mellitus   . Hypertension     Family History  Problem Relation Age of Onset  . Diabetes Mother   . Hypertension Mother   . Renal Disease Mother   . Cancer Father     Past Surgical History:  Procedure Laterality Date  . BTL     Social History   Occupational History  . Not on file  Tobacco Use  . Smoking status: Current Every Day Smoker    Packs/day: 0.24    Types: Cigarettes  . Smokeless tobacco: Never Used  Vaping Use  . Vaping Use: Former  Substance and Sexual Activity  . Alcohol use: No  . Drug use: No  . Sexual activity: Yes    Birth control/protection: None

## 2019-10-03 NOTE — Telephone Encounter (Signed)
Hey can you please get patient approved for right knee gel injection?

## 2019-10-07 NOTE — Telephone Encounter (Signed)
Will mail patient [name] patient assistance application, due to patient having medicaid.

## 2019-10-07 NOTE — Telephone Encounter (Signed)
Noted  

## 2019-10-13 ENCOUNTER — Other Ambulatory Visit: Payer: Self-pay

## 2019-10-13 ENCOUNTER — Encounter (HOSPITAL_COMMUNITY): Payer: Self-pay

## 2019-10-13 DIAGNOSIS — Z23 Encounter for immunization: Secondary | ICD-10-CM | POA: Insufficient documentation

## 2019-10-13 DIAGNOSIS — Z7984 Long term (current) use of oral hypoglycemic drugs: Secondary | ICD-10-CM | POA: Diagnosis not present

## 2019-10-13 DIAGNOSIS — E119 Type 2 diabetes mellitus without complications: Secondary | ICD-10-CM | POA: Diagnosis not present

## 2019-10-13 DIAGNOSIS — I10 Essential (primary) hypertension: Secondary | ICD-10-CM | POA: Insufficient documentation

## 2019-10-13 DIAGNOSIS — Y999 Unspecified external cause status: Secondary | ICD-10-CM | POA: Diagnosis not present

## 2019-10-13 DIAGNOSIS — Y9389 Activity, other specified: Secondary | ICD-10-CM | POA: Insufficient documentation

## 2019-10-13 DIAGNOSIS — W503XXA Accidental bite by another person, initial encounter: Secondary | ICD-10-CM | POA: Insufficient documentation

## 2019-10-13 DIAGNOSIS — Z79899 Other long term (current) drug therapy: Secondary | ICD-10-CM | POA: Insufficient documentation

## 2019-10-13 DIAGNOSIS — F17210 Nicotine dependence, cigarettes, uncomplicated: Secondary | ICD-10-CM | POA: Diagnosis not present

## 2019-10-13 DIAGNOSIS — Y929 Unspecified place or not applicable: Secondary | ICD-10-CM | POA: Insufficient documentation

## 2019-10-13 DIAGNOSIS — S61251A Open bite of left index finger without damage to nail, initial encounter: Secondary | ICD-10-CM | POA: Diagnosis not present
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
## 2019-10-14 ENCOUNTER — Emergency Department (HOSPITAL_COMMUNITY)
Admission: EM | Admit: 2019-10-14 | Discharge: 2019-10-14 | Disposition: A | Discharge status: Home / Self Care | Payer: Medicaid Other | Attending: Emergency Medicine | Admitting: Emergency Medicine

## 2019-10-14 ENCOUNTER — Other Ambulatory Visit: Payer: Self-pay | Admitting: Primary Care

## 2019-10-14 ENCOUNTER — Ambulatory Visit (HOSPITAL_COMMUNITY)
Admission: EM | Admit: 2019-10-14 | Discharge: 2019-10-14 | Disposition: A | Discharge status: Home / Self Care | Payer: Medicaid Other | Attending: Family Medicine | Admitting: Family Medicine

## 2019-10-14 ENCOUNTER — Ambulatory Visit
Admission: RE | Admit: 2019-10-14 | Discharge: 2019-10-14 | Disposition: A | Discharge status: Home / Self Care | Payer: Medicaid Other | Source: Ambulatory Visit | Attending: Primary Care | Admitting: Primary Care

## 2019-10-14 ENCOUNTER — Encounter (HOSPITAL_COMMUNITY): Payer: Self-pay

## 2019-10-14 DIAGNOSIS — W503XXA Accidental bite by another person, initial encounter: Secondary | ICD-10-CM

## 2019-10-14 DIAGNOSIS — N6310 Unspecified lump in the right breast, unspecified quadrant: Secondary | ICD-10-CM

## 2019-10-14 DIAGNOSIS — M79644 Pain in right finger(s): Secondary | ICD-10-CM | POA: Diagnosis not present

## 2019-10-14 DIAGNOSIS — W503XXD Accidental bite by another person, subsequent encounter: Secondary | ICD-10-CM

## 2019-10-14 MED ORDER — HYDROCODONE-ACETAMINOPHEN 5-325 MG PO TABS
2.0000 | ORAL_TABLET | Freq: Once | ORAL | Status: AC
  Administered 2019-10-14: 2 via ORAL

## 2019-10-14 MED ORDER — IBUPROFEN 800 MG PO TABS
800.0000 mg | ORAL_TABLET | Freq: Three times a day (TID) | ORAL | 0 refills | Status: DC | PRN
Start: 2019-10-14 — End: 2020-01-05

## 2019-10-14 MED ORDER — OXYCODONE-ACETAMINOPHEN 5-325 MG PO TABS
1.0000 | ORAL_TABLET | Freq: Once | ORAL | Status: AC
  Administered 2019-10-14: 1 via ORAL
  Filled 2019-10-14: qty 1

## 2019-10-14 MED ORDER — ONDANSETRON 4 MG PO TBDP
4.0000 mg | ORAL_TABLET | Freq: Once | ORAL | Status: AC
  Administered 2019-10-14: 4 mg via ORAL
  Filled 2019-10-14: qty 1

## 2019-10-14 MED ORDER — TETANUS-DIPHTH-ACELL PERTUSSIS 5-2.5-18.5 LF-MCG/0.5 IM SUSP
0.5000 mL | Freq: Once | INTRAMUSCULAR | Status: AC
  Administered 2019-10-14: 0.5 mL via INTRAMUSCULAR
  Filled 2019-10-14: qty 0.5

## 2019-10-14 MED ORDER — AMOXICILLIN-POT CLAVULANATE 875-125 MG PO TABS
1.0000 | ORAL_TABLET | Freq: Two times a day (BID) | ORAL | 0 refills | Status: DC
Start: 2019-10-14 — End: 2020-01-05

## 2019-10-14 MED ORDER — AMOXICILLIN-POT CLAVULANATE 875-125 MG PO TABS
1.0000 | ORAL_TABLET | Freq: Once | ORAL | Status: AC
  Administered 2019-10-14: 1 via ORAL
  Filled 2019-10-14: qty 1

## 2019-10-14 MED ORDER — HYDROCODONE-ACETAMINOPHEN 5-325 MG PO TABS: 1.0000 | ORAL_TABLET | Freq: Four times a day (QID) | ORAL | 0 refills | Status: DC | PRN

## 2019-10-14 MED ORDER — HYDROCODONE-ACETAMINOPHEN 5-325 MG PO TABS
ORAL_TABLET | ORAL | Status: AC
  Filled 2019-10-14: qty 2

## 2019-10-14 NOTE — ED Notes (Signed)
Pt's finger is soaking in betadine and normal saline. Will check back in 30 min.
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
## 2019-10-14 NOTE — ED Notes (Signed)
I applied a vaseline gauze and kerlex to pt's finger.
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
## 2019-10-14 NOTE — ED Notes (Signed)
Pt's finger wrapped using telfa nonadherent bandage and coban.
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
## 2019-10-14 NOTE — Discharge Instructions (Signed)
Take the antibiotic 2 x a day ELEVATE finger to reduce pain Take the Ibuprofen for moderate pain Take the hydrocodone for severe pain

## 2019-10-16 ENCOUNTER — Telehealth: Payer: Self-pay

## 2019-10-16 NOTE — Telephone Encounter (Signed)
Mailed [employer] application to patient's address listed for [employer], right knee. 

## 2019-10-19 MED ORDER — METFORMIN HCL 1000 MG PO TABS: ORAL_TABLET | ORAL | 0 refills | Status: DC

## 2019-10-19 MED ORDER — LEVEMIR FLEXTOUCH 100 UNIT/ML ~~LOC~~ SOPN: PEN_INJECTOR | SUBCUTANEOUS | 3 refills | Status: DC

## 2019-10-20 ENCOUNTER — Encounter (INDEPENDENT_AMBULATORY_CARE_PROVIDER_SITE_OTHER): Payer: Self-pay | Admitting: Primary Care

## 2019-10-20 NOTE — Progress Notes (Unsigned)
Patient was informed that orthopedics would try to have gel  Injection if  approved .She verbalized understanding

## 2019-10-31 ENCOUNTER — Telehealth: Payer: Self-pay

## 2019-10-31 NOTE — Telephone Encounter (Signed)
Called and left patient a VM advising her that [employer] pateint assistance application was mailed to her address to be completed and turned back into the office.

## 2019-11-06 ENCOUNTER — Other Ambulatory Visit (INDEPENDENT_AMBULATORY_CARE_PROVIDER_SITE_OTHER): Payer: Self-pay | Admitting: Primary Care

## 2019-11-06 ENCOUNTER — Other Ambulatory Visit: Payer: Self-pay | Admitting: Surgical

## 2019-11-06 ENCOUNTER — Other Ambulatory Visit (INDEPENDENT_AMBULATORY_CARE_PROVIDER_SITE_OTHER): Payer: Self-pay | Admitting: Family Medicine

## 2019-11-06 DIAGNOSIS — E119 Type 2 diabetes mellitus without complications: Secondary | ICD-10-CM

## 2019-11-06 DIAGNOSIS — E785 Hyperlipidemia, unspecified: Secondary | ICD-10-CM

## 2019-11-06 NOTE — Telephone Encounter (Signed)
See below

## 2019-11-06 NOTE — Telephone Encounter (Signed)
Approved per protocol.  Requested Prescriptions  Pending Prescriptions Disp Refills  . glimepiride (AMARYL) 4 MG tablet [Pharmacy Med Name: GLIMEPIRIDE 4MG  TABLET] 90 tablet 0    Sig: TAKE 1 TABLET BY MOUTH DAILY BEFORE BREAKFAST.     Endocrinology:  Diabetes - Sulfonylureas Failed - 11/06/2019  2:37 PM      Failed - HBA1C is between 0 and 7.9 and within 180 days    Hemoglobin A1C  Date Value Ref Range Status  10/01/2019 9.8 (A) 4.0 - 5.6 % Final   Hgb A1c MFr Bld  Date Value Ref Range Status  05/08/2017 7.4 (H) 4.8 - 5.6 % Final    Comment:             Prediabetes: 5.7 - 6.4          Diabetes: >6.4          Glycemic control for adults with diabetes: <7.0          Passed - Valid encounter within last 6 months    Recent Outpatient Visits          1 month ago Type 2 diabetes mellitus without complication, without long-term current use of insulin (HCC)   [facility] 05/10/2017 P, NP   4 months ago Type 2 diabetes mellitus without complication, without long-term current use of insulin (HCC)   [facility] Gwinda Passe P, NP   6 months ago Gastroesophageal reflux disease without esophagitis   [facility] Gwinda Passe, NP   8 months ago Chronic pain of both feet   [facility], NP   8 months ago Cough   [facility], NP      Future Appointments            In 2 months Grayce Sessions, MD [facility] Rheumatology           . celecoxib (CELEBREX) 200 MG capsule [Pharmacy Med Name: CELECOXIB 200MG  CAPSULE] 180 capsule 0    Sig: TAKE 1 CAPSULE BY MOUTH 2 TIMES DAILY.     Analgesics:  COX2 Inhibitors Passed - 11/06/2019  2:37 PM      Passed - HGB in normal range and within 360 days    Hemoglobin  Date Value Ref Range Status  06/21/2019 14.8 12.0 - 15.0 g/dL Final  11/08/2019 06/23/2019 11.1 - 15.9 g/dL Final          Passed - Cr in normal range and within 360 days    Creatinine, Ser  Date Value Ref Range Status  06/21/2019 0.78 0.44 - 1.00 mg/dL Final         Passed - Patient is not pregnant      Passed - Valid encounter within last 12 months    Recent Outpatient Visits          1 month ago Type 2 diabetes mellitus without complication, without long-term current use of insulin (HCC)   [facility] 75.1 P, NP   4 months ago Type 2 diabetes mellitus without complication, without long-term current use of insulin (HCC)   [facility] 06/23/2019 P, NP   6 months ago Gastroesophageal reflux disease without esophagitis   [facility] Gwinda Passe P, NP   8 months ago Chronic pain of both feet   [facility] Edwards,  Kinnie Scales, NP   8 months ago Cough   [facility] Grayce Sessions, NP      Future Appointments            In 2 months Pollyann Savoy, MD [facility]

## 2019-12-04 ENCOUNTER — Other Ambulatory Visit (INDEPENDENT_AMBULATORY_CARE_PROVIDER_SITE_OTHER): Payer: Self-pay | Admitting: Primary Care

## 2019-12-05 ENCOUNTER — Telehealth: Payer: Self-pay

## 2019-12-05 NOTE — Telephone Encounter (Signed)
Reason for CRM: Patient called to speak with Shon Millet, assistant to the doctor. She said it was a person matter and refused to give details. CB# [phone]

## 2019-12-07 ENCOUNTER — Other Ambulatory Visit: Payer: Self-pay

## 2019-12-07 ENCOUNTER — Encounter (HOSPITAL_COMMUNITY): Payer: Self-pay

## 2019-12-07 ENCOUNTER — Emergency Department (HOSPITAL_COMMUNITY): Payer: Medicaid Other

## 2019-12-07 ENCOUNTER — Emergency Department (HOSPITAL_COMMUNITY)
Admission: EM | Admit: 2019-12-07 | Discharge: 2019-12-08 | Disposition: A | Discharge status: Home / Self Care | Payer: Medicaid Other | Attending: Emergency Medicine | Admitting: Emergency Medicine

## 2019-12-07 DIAGNOSIS — R519 Headache, unspecified: Secondary | ICD-10-CM | POA: Insufficient documentation

## 2019-12-07 DIAGNOSIS — R05 Cough: Secondary | ICD-10-CM | POA: Diagnosis not present

## 2019-12-07 DIAGNOSIS — I10 Essential (primary) hypertension: Secondary | ICD-10-CM | POA: Insufficient documentation

## 2019-12-07 DIAGNOSIS — Z20822 Contact with and (suspected) exposure to covid-19: Secondary | ICD-10-CM | POA: Diagnosis not present

## 2019-12-07 DIAGNOSIS — Z794 Long term (current) use of insulin: Secondary | ICD-10-CM | POA: Diagnosis not present

## 2019-12-07 DIAGNOSIS — B349 Viral infection, unspecified: Secondary | ICD-10-CM | POA: Insufficient documentation

## 2019-12-07 DIAGNOSIS — E119 Type 2 diabetes mellitus without complications: Secondary | ICD-10-CM | POA: Diagnosis not present

## 2019-12-07 DIAGNOSIS — Z79899 Other long term (current) drug therapy: Secondary | ICD-10-CM | POA: Insufficient documentation

## 2019-12-07 DIAGNOSIS — R1013 Epigastric pain: Secondary | ICD-10-CM | POA: Diagnosis not present

## 2019-12-07 DIAGNOSIS — R1110 Vomiting, unspecified: Secondary | ICD-10-CM | POA: Insufficient documentation

## 2019-12-07 DIAGNOSIS — F17210 Nicotine dependence, cigarettes, uncomplicated: Secondary | ICD-10-CM | POA: Insufficient documentation

## 2019-12-07 MED ORDER — PROCHLORPERAZINE EDISYLATE 10 MG/2ML IJ SOLN
10.0000 mg | Freq: Once | INTRAMUSCULAR | Status: AC
  Administered 2019-12-08: 10 mg via INTRAVENOUS
  Filled 2019-12-07: qty 2

## 2019-12-07 MED ORDER — LACTATED RINGERS IV BOLUS
1000.0000 mL | Freq: Once | INTRAVENOUS | Status: AC
  Administered 2019-12-08: 1000 mL via INTRAVENOUS

## 2019-12-07 MED ORDER — DIPHENHYDRAMINE HCL 50 MG/ML IJ SOLN
25.0000 mg | Freq: Once | INTRAMUSCULAR | Status: AC
  Administered 2019-12-08: 25 mg via INTRAVENOUS
  Filled 2019-12-07: qty 1

## 2019-12-07 NOTE — ED Provider Notes (Signed)
[facility]-EMERGENCY DEPT Provider Note   CSN: [id number] Arrival date & time: 12/07/19  2008     History Chief Complaint  Patient presents with  . Headache    [name] is a 49 y.o. female.  Patient has multiple complaints.  The main what seems to be pretty bad headache is been going on for for 7 days but she is also had a cough, vomiting, diarrhea, upper abdominal pain, myalgias, chills.  She had her first Covid vaccine, more during, on the seventeenth.  Her symptoms started on the 22nd.  No known sick contacts.  She states that she is probably taking 50-60 BC powders in the last 5 or 6 days because it helps her headaches about 5 minutes but then the headache comes back and she takes more.  No change in urination.   Headache Pain location:  Generalized Quality:  Dull Radiates to:  Does not radiate Onset quality:  Gradual Duration:  5 days Timing:  Constant Progression:  Worsening Chronicity:  New Similar to prior headaches: no   Context: not exposure to bright light and not defecating        Past Medical History:  Diagnosis Date  . Anxiety   . Common migraine with intractable migraine 10/02/2016  . Depression   . Diabetes mellitus   . Hypertension     Patient Active Problem List   Diagnosis Date Noted  . Hot flashes 10/02/2018  . Vaginal dryness 10/02/2018  . BIH (benign intracranial hypertension) 11/06/2017  . Somnolence, daytime 07/30/2017  . Snoring 07/30/2017  . Morbid obesity (HCC) 07/30/2017  . Migraine 10/02/2016  . Common migraine with intractable migraine 10/02/2016  . Type 2 diabetes mellitus without complication, without long-term current use of insulin (HCC) 08/17/2016    Past Surgical History:  Procedure Laterality Date  . BTL       OB History    Gravida  5   Para  3   Term  3   Preterm      AB  2   Living  3     SAB  1   TAB  1   Ectopic      Multiple      Live Births              Family  History  Problem Relation Age of Onset  . Diabetes Mother   . Hypertension Mother   . Renal Disease Mother   . Cancer Father     Social History   Tobacco Use  . Smoking status: Current Every Day Smoker    Packs/day: 0.24    Types: Cigarettes  . Smokeless tobacco: Never Used  Vaping Use  . Vaping Use: Former  Substance Use Topics  . Alcohol use: No  . Drug use: No    Home Medications Prior to Admission medications   Medication Sig Start Date End Date Taking? Authorizing Provider  amoxicillin-clavulanate (AUGMENTIN) 875-125 MG tablet Take 1 tablet by mouth every 12 (twelve) hours. 10/14/19   Ward, Layla Maw, DO  aspirin EC 81 MG tablet Take 81 mg by mouth every 6 (six) hours as needed for mild pain.     [provider]  butalbital-acetaminophen-caffeine (FIORICET) [phone] MG tablet Take 1 tablet by mouth every 6 (six) hours as needed for headache. 12/08/19 12/07/20  Finlee Milo, Barbara Cower, MD  celecoxib (CELEBREX) 200 MG capsule TAKE 1 CAPSULE BY MOUTH 2 TIMES DAILY. 11/06/19   Grayce Sessions, NP  EASY COMFORT  PEN NEEDLES 31G X 5 MM MISC USE AS INSTRUCTED. TWICE DAILY. 12/04/19   Grayce Sessions, NP  fluticasone (FLOVENT HFA) 44 MCG/ACT inhaler Inhale 2 puffs into the lungs 2 (two) times daily. 01/19/19   Grayce Sessions, NP  furosemide (LASIX) 20 MG tablet TAKE 2 TABLETS DAILY Patient taking differently: Take 40 mg by mouth every evening.  03/10/19   Butch Penny, NP  glimepiride (AMARYL) 4 MG tablet TAKE 1 TABLET BY MOUTH DAILY BEFORE BREAKFAST. 11/06/19   Grayce Sessions, NP  glucose blood (ACCU-CHEK AVIVA PLUS) test strip Use as instructed 01/02/18   Loletta Specter, PA-C  HYDROcodone-acetaminophen (NORCO/VICODIN) 5-325 MG tablet Take 1-2 tablets by mouth every 6 (six) hours as needed for severe pain. 10/14/19   Eustace Moore, MD  ibuprofen (ADVIL) 800 MG tablet Take 1 tablet (800 mg total) by mouth every 8 (eight) hours as needed for mild pain. 10/14/19    Ward, Layla Maw, DO  insulin detemir (LEVEMIR FLEXTOUCH) 100 UNIT/ML FlexPen INJECT 50 UNITS INTO THE SKIN 2 (TWO) TIMES DAILY. 10/19/19   Grayce Sessions, NP  Lancets (ACCU-CHEK SOFT TOUCH) lancets Use as instructed 08/01/18   Grayce Sessions, NP  losartan (COZAAR) 25 MG tablet TAKE 1 TABLET BY MOUTH DAILY. 11/06/19   Grayce Sessions, NP  metFORMIN (GLUCOPHAGE) 1000 MG tablet TAKE 1 TABLET BY MOUTH 2 TIMES DAILY WITH A MEAL. 10/19/19   Grayce Sessions, NP  NOVOLOG FLEXPEN 100 UNIT/ML FlexPen SMARTSIG:10 Unit(s) SUB-Q 3 Times Daily 07/31/19   [provider]  ondansetron (ZOFRAN) 4 MG tablet Take 1 tablet (4 mg total) by mouth every 6 (six) hours. 12/08/19   Shirin Echeverry, Barbara Cower, MD  pantoprazole (PROTONIX) 40 MG tablet TAKE 1 TABLET (40 MG TOTAL) BY MOUTH DAILY. 09/22/19   Grayce Sessions, NP  pravastatin (PRAVACHOL) 10 MG tablet TAKE 1 TABLET BY MOUTH DAILY. 11/06/19   Grayce Sessions, NP  PROVENTIL HFA 108 (90 Base) MCG/ACT inhaler INHALE 1 TO 2 PUFFS BY MOUTH EVERY 6 (SIX) HOURS AS NEEDED FOR WHEEZING OR SHORTNESS OF BREATH. Patient taking differently: Inhale 1-2 puffs into the lungs every 6 (six) hours as needed for wheezing or shortness of breath.  01/10/19   Grayce Sessions, NP    Allergies    Lisinopril  Review of Systems   Review of Systems  Neurological: Positive for headaches.  All other systems reviewed and are negative.   Physical Exam Updated Vital Signs BP (!) 147/90   Pulse 78   Temp 99.1 F (37.3 C) (Oral)   Resp (!) 21   Ht 5\' 5"  (1.651 m)   Wt 106.6 kg   SpO2 98%   BMI 39.11 kg/m   Physical Exam Vitals and nursing note reviewed.  Constitutional:      Appearance: She is well-developed.  HENT:     Head: Normocephalic and atraumatic.  Cardiovascular:     Rate and Rhythm: Normal rate and regular rhythm.  Pulmonary:     Effort: No respiratory distress.     Breath sounds: No stridor.  Abdominal:     General: There is no distension.      Tenderness: There is abdominal tenderness (mild epigastric).  Musculoskeletal:     Cervical back: Normal range of motion.  Neurological:     Mental Status: She is alert.     Cranial Nerves: No cranial nerve deficit or dysarthria.     ED Results / Procedures / Treatments  Labs (all labs ordered are listed, but only abnormal results are displayed) Labs Reviewed  CBC WITH DIFFERENTIAL/PLATELET - Abnormal; Notable for the following components:      Result Value   WBC 13.7 (*)    RBC 5.39 (*)    MCV 75.0 (*)    MCH 25.0 (*)    Neutro Abs 8.4 (*)    Lymphs Abs 4.3 (*)    All other components within normal limits  COMPREHENSIVE METABOLIC PANEL - Abnormal; Notable for the following components:   Potassium 3.4 (*)    Glucose, Bld 127 (*)    ALT 47 (*)    Alkaline Phosphatase 133 (*)    All other components within normal limits  SALICYLATE LEVEL - Abnormal; Notable for the following components:   Salicylate Lvl <7.0 (*)    All other components within normal limits  SARS CORONAVIRUS 2 BY RT PCR (HOSPITAL ORDER, PERFORMED IN [facility] LAB)  LIPASE, BLOOD  BLOOD GAS, VENOUS    EKG EKG Interpretation  Date/Time:  Monday December 08 2019 00:14:28 EDT Ventricular Rate:  83 PR Interval:    QRS Duration: 90 QT Interval:  372 QTC Calculation: 438 R Axis:   52 Text Interpretation: Sinus rhythm LAE, consider biatrial enlargement No significant change since last tracing Confirmed by Marily Memos [phone]) on 12/08/2019 1:13:25 AM   Radiology DG Chest Portable 1 View  Result Date: 12/07/2019 CLINICAL DATA:  Headache, chest tightness, sore throat, chills EXAM: PORTABLE CHEST 1 VIEW COMPARISON:  06/21/2019 FINDINGS: The heart size and mediastinal contours are within normal limits. Both lungs are clear. The visualized skeletal structures are unremarkable. IMPRESSION: No active disease. Electronically Signed   By: Sharlet Salina M.D.   On: 12/07/2019 23:31     Procedures Procedures (including critical care time)  Medications Ordered in ED Medications  lactated ringers bolus 1,000 mL (0 mLs Intravenous Stopped 12/08/19 0201)  prochlorperazine (COMPAZINE) injection 10 mg (10 mg Intravenous Given 12/08/19 0005)  diphenhydrAMINE (BENADRYL) injection 25 mg (25 mg Intravenous Given 12/08/19 0003)    ED Course  I have reviewed the triage vital signs and the nursing notes.  Pertinent labs & imaging results that were available during my care of the patient were reviewed by me and considered in my medical decision making (see chart for details).    MDM Rules/Calculators/A&P                          Patient certainly has all the symptoms of Covid we will check for that.  Also could be a prolonged GI illness however seems a bit less likely with all the other symptoms.  Could be some other type of viral illness as well but no significantly high temperature to suggest influenza.  Patient stated significant amount of aspirin over the last few days we will check a salicylate level and a blood gas to make sure she is not toxic.  Overall patient appears well so that this is less likely.  We will also check kidney function, give some fluids.  We will start with Compazine and Benadryl to help with her headache and her nausea along with some fluids for dehydration.  Will then tested for Covid and treat symptomatically and accordingly.  Patient would be a candidate for monoclonal antibody infusion if positive.  Workup unremarkable. Headache improved. Tolerating PO. Stable for discharge.   Final Clinical Impression(s) / ED Diagnoses Final diagnoses:  Acute nonintractable headache, unspecified  headache type  Viral illness    Rx / DC Orders ED Discharge Orders         Ordered    butalbital-acetaminophen-caffeine (FIORICET) 50-325-40 MG tablet  Every 6 hours PRN        12/08/19 0318    ondansetron (ZOFRAN) 4 MG tablet  Every 6 hours        12/08/19 0323            Kinan Safley, Barbara CowerJason, MD 12/08/19 [phone]

## 2019-12-07 NOTE — ED Triage Notes (Signed)
Pt coming in c/o headache, chest tightness, N/V/D, sore throat and chills since 8/22. Got first vaccine on 8/17

## 2019-12-07 NOTE — Telephone Encounter (Signed)
Called patient she is presently in the emergency room now with complaints headache, cough, fever and chills- concern that when she cough she dribbles urine- will follow up

## 2019-12-08 LAB — COMPREHENSIVE METABOLIC PANEL
ALT: 47 U/L — ABNORMAL HIGH (ref 0–44)
AST: 26 U/L (ref 15–41)
Albumin: 3.8 g/dL (ref 3.5–5.0)
Alkaline Phosphatase: 133 U/L — ABNORMAL HIGH (ref 38–126)
Anion gap: 10 (ref 5–15)
BUN: 10 mg/dL (ref 6–20)
CO2: 25 mmol/L (ref 22–32)
Calcium: 9 mg/dL (ref 8.9–10.3)
Chloride: 108 mmol/L (ref 98–111)
Creatinine, Ser: 0.72 mg/dL (ref 0.44–1.00)
GFR calc Af Amer: >60 mL/min (ref >60)
GFR calc non Af Amer: >60 mL/min (ref >60)
Glucose, Bld: 127 mg/dL — ABNORMAL HIGH (ref 70–99)
Potassium: 3.4 mmol/L — ABNORMAL LOW (ref 3.5–5.1)
Sodium: 143 mmol/L (ref 135–145)
Total Bilirubin: 0.4 mg/dL (ref 0.3–1.2)
Total Protein: 7.4 g/dL (ref 6.5–8.1)

## 2019-12-08 LAB — CBC WITH DIFFERENTIAL/PLATELET
Abs Immature Granulocytes: 0.04 10*3/uL (ref 0.00–0.07)
Basophils Absolute: 0 10*3/uL (ref 0.0–0.1)
Basophils Relative: 0 %
Eosinophils Absolute: 0.1 10*3/uL (ref 0.0–0.5)
Eosinophils Relative: 1 %
HCT: 40.4 % (ref 36.0–46.0)
Hemoglobin: 13.5 g/dL (ref 12.0–15.0)
Immature Granulocytes: 0 %
Lymphocytes Relative: 32 %
Lymphs Abs: 4.3 10*3/uL — ABNORMAL HIGH (ref 0.7–4.0)
MCH: 25 pg — ABNORMAL LOW (ref 26.0–34.0)
MCHC: 33.4 g/dL (ref 30.0–36.0)
MCV: 75 fL — ABNORMAL LOW (ref 80.0–100.0)
Monocytes Absolute: 0.7 10*3/uL (ref 0.1–1.0)
Monocytes Relative: 5 %
Neutro Abs: 8.4 10*3/uL — ABNORMAL HIGH (ref 1.7–7.7)
Neutrophils Relative %: 62 %
Platelets: 262 10*3/uL (ref 150–400)
RBC: 5.39 MIL/uL — ABNORMAL HIGH (ref 3.87–5.11)
RDW: 14.3 % (ref 11.5–15.5)
WBC: 13.7 10*3/uL — ABNORMAL HIGH (ref 4.0–10.5)
nRBC: 0 % (ref 0.0–0.2)

## 2019-12-08 LAB — SARS CORONAVIRUS 2 BY RT PCR (HOSPITAL ORDER, PERFORMED IN ~~LOC~~ HOSPITAL LAB): SARS Coronavirus 2: NEGATIVE

## 2019-12-08 LAB — SALICYLATE LEVEL: Salicylate Lvl: <7 mg/dL — ABNORMAL LOW (ref 7.0–30.0)

## 2019-12-08 LAB — LIPASE, BLOOD: Lipase: 23 U/L (ref 11–51)

## 2019-12-08 MED ORDER — ONDANSETRON HCL 4 MG PO TABS
4.0000 mg | ORAL_TABLET | Freq: Four times a day (QID) | ORAL | 0 refills | Status: DC
Start: 2019-12-08 — End: 2020-01-05

## 2019-12-08 MED ORDER — BUTALBITAL-APAP-CAFFEINE 50-325-40 MG PO TABS
1.0000 | ORAL_TABLET | Freq: Four times a day (QID) | ORAL | 0 refills | Status: DC | PRN
Start: 2019-12-08 — End: 2020-03-25

## 2019-12-08 NOTE — ED Notes (Addendum)
Patient able to hold down PO, resting comfortably. MD notified.

## 2019-12-08 NOTE — ED Notes (Signed)
Pt asleep and resting comfortably at this time.  

## 2019-12-16 ENCOUNTER — Ambulatory Visit (HOSPITAL_COMMUNITY)
Admission: EM | Admit: 2019-12-16 | Discharge: 2019-12-16 | Disposition: A | Discharge status: Home / Self Care | Payer: Medicaid Other | Attending: Physician Assistant | Admitting: Physician Assistant

## 2019-12-16 ENCOUNTER — Encounter (HOSPITAL_COMMUNITY): Payer: Self-pay

## 2019-12-16 ENCOUNTER — Other Ambulatory Visit: Payer: Self-pay

## 2019-12-16 DIAGNOSIS — M79601 Pain in right arm: Secondary | ICD-10-CM | POA: Diagnosis not present

## 2019-12-16 MED ORDER — ACETAMINOPHEN 325 MG PO TABS
650.0000 mg | ORAL_TABLET | Freq: Four times a day (QID) | ORAL | 0 refills | Status: DC | PRN
Start: 2019-12-16 — End: 2020-03-25

## 2019-12-16 MED ORDER — KETOROLAC TROMETHAMINE 30 MG/ML IJ SOLN
INTRAMUSCULAR | Status: AC
  Filled 2019-12-16: qty 1

## 2019-12-16 MED ORDER — NAPROXEN 500 MG PO TABS: 500.0000 mg | ORAL_TABLET | Freq: Two times a day (BID) | ORAL | 0 refills | Status: AC

## 2019-12-16 MED ORDER — KETOROLAC TROMETHAMINE 30 MG/ML IJ SOLN
30.0000 mg | Freq: Once | INTRAMUSCULAR | Status: AC
  Administered 2019-12-16: 30 mg via INTRAMUSCULAR

## 2019-12-16 NOTE — ED Triage Notes (Signed)
Pt c/o 10/10 throbbing pain in right arm form right shoulder down to right fingers since last night. PT denies injury. Pt is eating chips using right hand and arm fine.

## 2019-12-16 NOTE — Discharge Instructions (Signed)
Take the naproxen and tylenol  Use sling for comfort  Follow up with your primary care and orthopedist

## 2019-12-16 NOTE — ED Provider Notes (Addendum)
[facility]    CSN: [id number] Arrival date & time: 12/16/19  1704      History   Chief Complaint Chief Complaint  Patient presents with  . Arm Pain    HPI [name] is a 49 y.o. female.   Patient reports her right arm pain.  She reports this started yesterday.  She reports pain from her "shoulder to her fingertips".  She reports pain in the front of the shoulder, elbow and her fingers.  She denies injury.  She reports a throbbing pain.  She reports pain with certain movements.  Reports pain with touching the arm.  No swelling.  No fevers.  Has had issues with her other joints in the past.  Denies any pain in the left arm.  Denies any pain in the neck.  Right-handed.  Is previously been on Celebrex, however she is out of this.  There have been no falls or injuries.  No radiation in the chest.  No shortness of breath.  Eating and drinking fine.     Past Medical History:  Diagnosis Date  . Anxiety   . Common migraine with intractable migraine 10/02/2016  . Depression   . Diabetes mellitus   . Hypertension     Patient Active Problem List   Diagnosis Date Noted  . Hot flashes 10/02/2018  . Vaginal dryness 10/02/2018  . BIH (benign intracranial hypertension) 11/06/2017  . Somnolence, daytime 07/30/2017  . Snoring 07/30/2017  . Morbid obesity (HCC) 07/30/2017  . Migraine 10/02/2016  . Common migraine with intractable migraine 10/02/2016  . Type 2 diabetes mellitus without complication, without long-term current use of insulin (HCC) 08/17/2016    Past Surgical History:  Procedure Laterality Date  . BTL      OB History    Gravida  5   Para  3   Term  3   Preterm      AB  2   Living  3     SAB  1   TAB  1   Ectopic      Multiple      Live Births               Home Medications    Prior to Admission medications   Medication Sig Start Date End Date Taking? Authorizing Provider  acetaminophen (TYLENOL) 325 MG tablet Take 2 tablets  (650 mg total) by mouth every 6 (six) hours as needed. 12/16/19   Shevette Bess, Veryl Speak, PA-C  amoxicillin-clavulanate (AUGMENTIN) 875-125 MG tablet Take 1 tablet by mouth every 12 (twelve) hours. 10/14/19   Ward, Layla Maw, DO  aspirin EC 81 MG tablet Take 81 mg by mouth every 6 (six) hours as needed for mild pain.     [provider]  butalbital-acetaminophen-caffeine (FIORICET) [phone] MG tablet Take 1 tablet by mouth every 6 (six) hours as needed for headache. 12/08/19 12/07/20  Mesner, Barbara Cower, MD  celecoxib (CELEBREX) 200 MG capsule TAKE 1 CAPSULE BY MOUTH 2 TIMES DAILY. 11/06/19   Grayce Sessions, NP  EASY COMFORT PEN NEEDLES 31G X 5 MM MISC USE AS INSTRUCTED. TWICE DAILY. 12/04/19   Grayce Sessions, NP  fluticasone (FLOVENT HFA) 44 MCG/ACT inhaler Inhale 2 puffs into the lungs 2 (two) times daily. 01/19/19   Grayce Sessions, NP  furosemide (LASIX) 20 MG tablet TAKE 2 TABLETS DAILY Patient taking differently: Take 40 mg by mouth every evening.  03/10/19   Butch Penny, NP  glimepiride (AMARYL) 4 MG  tablet TAKE 1 TABLET BY MOUTH DAILY BEFORE BREAKFAST. 11/06/19   Grayce Sessions, NP  glucose blood (ACCU-CHEK AVIVA PLUS) test strip Use as instructed 01/02/18   Loletta Specter, PA-C  HYDROcodone-acetaminophen (NORCO/VICODIN) 5-325 MG tablet Take 1-2 tablets by mouth every 6 (six) hours as needed for severe pain. 10/14/19   Eustace Moore, MD  ibuprofen (ADVIL) 800 MG tablet Take 1 tablet (800 mg total) by mouth every 8 (eight) hours as needed for mild pain. 10/14/19   Ward, Layla Maw, DO  insulin detemir (LEVEMIR FLEXTOUCH) 100 UNIT/ML FlexPen INJECT 50 UNITS INTO THE SKIN 2 (TWO) TIMES DAILY. 10/19/19   Grayce Sessions, NP  Lancets (ACCU-CHEK SOFT TOUCH) lancets Use as instructed 08/01/18   Grayce Sessions, NP  losartan (COZAAR) 25 MG tablet TAKE 1 TABLET BY MOUTH DAILY. 11/06/19   Grayce Sessions, NP  metFORMIN (GLUCOPHAGE) 1000 MG tablet TAKE 1 TABLET BY MOUTH 2 TIMES  DAILY WITH A MEAL. 10/19/19   Grayce Sessions, NP  naproxen (NAPROSYN) 500 MG tablet Take 1 tablet (500 mg total) by mouth 2 (two) times daily with a meal for 7 days. 12/16/19 12/23/19  Kalob Bergen, Veryl Speak, PA-C  NOVOLOG FLEXPEN 100 UNIT/ML FlexPen SMARTSIG:10 Unit(s) SUB-Q 3 Times Daily 07/31/19   [provider]  ondansetron (ZOFRAN) 4 MG tablet Take 1 tablet (4 mg total) by mouth every 6 (six) hours. 12/08/19   Mesner, Barbara Cower, MD  pantoprazole (PROTONIX) 40 MG tablet TAKE 1 TABLET (40 MG TOTAL) BY MOUTH DAILY. 09/22/19   Grayce Sessions, NP  pravastatin (PRAVACHOL) 10 MG tablet TAKE 1 TABLET BY MOUTH DAILY. 11/06/19   Grayce Sessions, NP  PROVENTIL HFA 108 (90 Base) MCG/ACT inhaler INHALE 1 TO 2 PUFFS BY MOUTH EVERY 6 (SIX) HOURS AS NEEDED FOR WHEEZING OR SHORTNESS OF BREATH. Patient taking differently: Inhale 1-2 puffs into the lungs every 6 (six) hours as needed for wheezing or shortness of breath.  01/10/19   Grayce Sessions, NP    Family History Family History  Problem Relation Age of Onset  . Diabetes Mother   . Hypertension Mother   . Renal Disease Mother   . Cancer Father     Social History Social History   Tobacco Use  . Smoking status: Current Every Day Smoker    Packs/day: 0.24    Types: Cigarettes  . Smokeless tobacco: Never Used  Vaping Use  . Vaping Use: Former  Substance Use Topics  . Alcohol use: No  . Drug use: No     Allergies   Lisinopril   Review of Systems Review of Systems   Physical Exam Triage Vital Signs ED Triage Vitals  Enc Vitals Group     BP 12/16/19 1851 (!) 169/112     Pulse Rate 12/16/19 1851 84     Resp 12/16/19 1851 18     Temp 12/16/19 1851 99.1 F (37.3 C)     Temp Source 12/16/19 1851 Oral     SpO2 --      Weight 12/16/19 1852 240 lb (108.9 kg)     Height 12/16/19 1852 5' 5.5" (1.664 m)     Head Circumference --      Peak Flow --      Pain Score 12/16/19 1852 10     Pain Loc --      Pain Edu? --      Excl. in  GC? --    No data found.  Updated  Vital Signs BP (!) 169/112   Pulse 84   Temp 99.1 F (37.3 C) (Oral)   Resp 18   Ht 5' 5.5" (1.664 m)   Wt 240 lb (108.9 kg)   BMI 39.33 kg/m   Visual Acuity Right Eye Distance:   Left Eye Distance:   Bilateral Distance:    Right Eye Near:   Left Eye Near:    Bilateral Near:     Physical Exam Vitals and nursing note reviewed.  Constitutional:      General: She is not in acute distress.    Appearance: She is well-developed. She is not ill-appearing.  HENT:     Head: Normocephalic and atraumatic.  Eyes:     Conjunctiva/sclera: Conjunctivae normal.  Cardiovascular:     Rate and Rhythm: Normal rate and regular rhythm.     Heart sounds: No murmur heard.   Pulmonary:     Effort: Pulmonary effort is normal. No respiratory distress.     Breath sounds: Normal breath sounds.  Abdominal:     Palpations: Abdomen is soft.     Tenderness: There is no abdominal tenderness.  Musculoskeletal:     Cervical back: Normal range of motion and neck supple. No rigidity or tenderness.     Comments: Right upper extremity without deformity, swelling, erythema or ecchymosis.  Mild tenderness throughout the musculature of the deltoid.  Tenderness through the biceps, extending into the bicipital groove.  No bony tenderness of the shoulder, elbow.  Some tenderness palpation of the wrist and fingers joints.  No swelling of the finger joints.  Patient has full range of motion of the upper extremity, with pain elicited with terminal flexion of the shoulder.  No relief of pain with arm held overhead.  Poorly localized tenderness through the forearm musculature.  Radial pulse 2+.  Sensation intact.  Cap refill less than 2 seconds.     Skin:    General: Skin is warm and dry.  Neurological:     Mental Status: She is alert.      UC Treatments / Results  Labs (all labs ordered are listed, but only abnormal results are displayed) Labs Reviewed - No data to  display  EKG   Radiology No results found.  Procedures Procedures (including critical care time)  Medications Ordered in UC Medications  ketorolac (TORADOL) 30 MG/ML injection 30 mg (has no administration in time range)    Initial Impression / Assessment and Plan / UC Course  I have reviewed the triage vital signs and the nursing notes.  Pertinent labs & imaging results that were available during my care of the patient were reviewed by me and considered in my medical decision making (see chart for details).     #Right arm pain Patient is a 49 year old with history of chronic pains and arthritis presenting with right arm pain.  Poorly localized exam.  Unclear etiology, no red flags.  Will defer imaging.  Will trial NSAID, Tylenol and a sling for comfort.  We will have her follow-up with her primary care she did have ANA positive in the setting of other joint pain previously.  Toradol given in clinic.  Patient verbalized agreement understanding plan of care  Nurses note from triage noted patient freely moving and utilizing right arm to eat chips during triage without issue, but complaining of pain.  Final Clinical Impressions(s) / UC Diagnoses   Final diagnoses:  Right arm pain     Discharge Instructions     Take the  naproxen and tylenol  Use sling for comfort  Follow up with your primary care and orthopedist      ED Prescriptions    Medication Sig Dispense Auth. Provider   naproxen (NAPROSYN) 500 MG tablet Take 1 tablet (500 mg total) by mouth 2 (two) times daily with a meal for 7 days. 14 tablet Santa Abdelrahman, Veryl SpeakJacob E, PA-C   acetaminophen (TYLENOL) 325 MG tablet Take 2 tablets (650 mg total) by mouth every 6 (six) hours as needed. 30 tablet Luba Matzen, Veryl SpeakJacob E, PA-C     PDMP not reviewed this encounter.   Hermelinda Medicusarr, Jarrod Mcenery E, PA-C 12/16/19 1928    Rollyn Scialdone, Veryl SpeakJacob E, PA-C 12/16/19 2308

## 2019-12-23 NOTE — Progress Notes (Signed)
Office Visit Note  Patient: [name]             Date of Birth: [birth year]           MRN: [id number]             PCP: Grayce Sessions, NP Referring: Grayce Sessions, NP Visit Date: 01/05/2020 Occupation: @GUAROCC @  Subjective:  Pain in multiple joints.   History of Present Illness: [name] is a 49 y.o. female seen in consultation per request of her PCP.  According the patient she has had pain in her joints and muscles for at least 15 years.  She has been under care of Dr. 44 and Dr. August Saucer.  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  She has had injections to her spine.  She also has osteoarthritis in her knee joints for which she has had cortisone injection by Dr. Alvester Morin.  She was seeing pain management but now her insurance is not covering it.  She states all of her joints and muscles are painful.  She is in severe pain.  She describes her pain daily on a scale of 0-10 about 10.  She gives history of depression and anxiety.  She states she was treated with Klonopin in the past.  She also has migraine headaches.  Activities of Daily Living:  Patient reports morning stiffness for 24 hours.   Patient Reports nocturnal pain.  Difficulty dressing/grooming: Reports Difficulty climbing stairs: Reports Difficulty getting out of chair: Reports Difficulty using hands for taps, buttons, cutlery, and/or writing: Reports  Review of Systems  Constitutional: Positive for fatigue.  HENT: Positive for mouth sores and mouth dryness. Negative for nose dryness.   Eyes: Positive for dryness. Negative for pain and itching.  Respiratory: Positive for shortness of breath and difficulty breathing.   Cardiovascular: Positive for palpitations and swelling in legs/feet. Negative for chest pain.  Gastrointestinal: Positive for constipation. Negative for blood in stool and diarrhea.  Endocrine: Negative for increased urination.  Genitourinary:  Negative for difficulty urinating.  Musculoskeletal: Positive for arthralgias, joint pain, joint swelling, myalgias, muscle weakness, morning stiffness, muscle tenderness and myalgias.  Skin: Negative for color change, rash and redness.  Allergic/Immunologic: Negative for susceptible to infections.  Neurological: Positive for dizziness, numbness, headaches, memory loss and weakness.  Hematological: Positive for bruising/bleeding tendency.  Psychiatric/Behavioral: Positive for depressed mood, confusion and sleep disturbance. The patient is nervous/anxious.     PMFS History:  Patient Active Problem List   Diagnosis Date Noted  . Hot flashes 10/02/2018  . Vaginal dryness 10/02/2018  . BIH (benign intracranial hypertension) 11/06/2017  . Somnolence, daytime 07/30/2017  . Snoring 07/30/2017  . Morbid obesity (HCC) 07/30/2017  . Migraine 10/02/2016  . Common migraine with intractable migraine 10/02/2016  . Type 2 diabetes mellitus without complication, without long-term current use of insulin (HCC) 08/17/2016    Past Medical History:  Diagnosis Date  . Anxiety   . Common migraine with intractable migraine 10/02/2016  . Depression   . Diabetes mellitus   . Hypertension     Family History  Problem Relation Age of Onset  . Diabetes Mother   . Hypertension Mother   . Renal Disease Mother   . Cancer Father   . Diabetes Sister   . Diabetes Brother   . Renal Disease Brother   . Diabetes Brother   . Diabetes Sister   . Diabetes Brother   . Diabetes Brother   .  Diabetes Brother   . Healthy Daughter   . Healthy Daughter    Past Surgical History:  Procedure Laterality Date  . CHOLECYSTECTOMY    . TUBAL LIGATION     Social History   Social History Narrative   Right handed    Lives with daughter   Caffeine use: Drinks coffee/tea/soda sometimes   Immunization History  Administered Date(s) Administered  . Moderna SARS-COVID-2 Vaccination 12/05/2019  . PPD Test 04/30/2018  .  Tdap 01/02/2018, 10/14/2019     Objective: Vital Signs: BP (!) 146/87 (BP Location: Left Wrist, Patient Position: Sitting, Cuff Size: Normal)   Pulse 80   Resp 17   Ht 5' 5.5" (1.664 m)   Wt 251 lb (113.9 kg)   BMI 41.13 kg/m    Physical Exam Vitals and nursing note reviewed.  Constitutional:      Appearance: She is well-developed.  HENT:     Head: Normocephalic and atraumatic.  Eyes:     Conjunctiva/sclera: Conjunctivae normal.  Cardiovascular:     Rate and Rhythm: Normal rate and regular rhythm.     Heart sounds: Normal heart sounds.  Pulmonary:     Effort: Pulmonary effort is normal.     Breath sounds: Normal breath sounds.  Abdominal:     General: Bowel sounds are normal.     Palpations: Abdomen is soft.  Musculoskeletal:     Cervical back: Normal range of motion.  Lymphadenopathy:     Cervical: No cervical adenopathy.  Skin:    General: Skin is warm and dry.     Capillary Refill: Capillary refill takes less than 2 seconds.  Neurological:     Mental Status: She is alert and oriented to person, place, and time.  Psychiatric:        Behavior: Behavior normal.      Musculoskeletal Exam: She has painful limited range of motion of her cervical thoracic and lumbar spine.  She has painful range of motion of her shoulder joints.  Elbow joints, wrist joints, MCPs PIPs and DIPs been good range of motion with no synovitis.  She has painful range of motion of bilateral hip joints, bilateral knee joints.  She had tenderness over knee joints ankles and MTPs.  No synovitis was noted.  CDAI Exam: CDAI Score: -- Patient Global: --; Provider Global: -- Swollen: --; Tender: -- Joint Exam 01/05/2020   No joint exam has been documented for this visit   There is currently no information documented on the homunculus. Go to the Rheumatology activity and complete the homunculus joint exam.  Investigation: No additional findings.  Imaging: DG Chest Portable 1 View  Result Date:  12/07/2019 CLINICAL DATA:  Headache, chest tightness, sore throat, chills EXAM: PORTABLE CHEST 1 VIEW COMPARISON:  06/21/2019 FINDINGS: The heart size and mediastinal contours are within normal limits. Both lungs are clear. The visualized skeletal structures are unremarkable. IMPRESSION: No active disease. Electronically Signed   By: Sharlet Salina M.D.   On: 12/07/2019 23:31    Recent Labs: Lab Results  Component Value Date   WBC 13.7 (H) 12/07/2019   HGB 13.5 12/07/2019   PLT 262 12/07/2019   NA 143 12/07/2019   K 3.4 (L) 12/07/2019   CL 108 12/07/2019   CO2 25 12/07/2019   GLUCOSE 127 (H) 12/07/2019   BUN 10 12/07/2019   CREATININE 0.72 12/07/2019   BILITOT 0.4 12/07/2019   ALKPHOS 133 (H) 12/07/2019   AST 26 12/07/2019   ALT 47 (H) 12/07/2019  PROT 7.4 12/07/2019   ALBUMIN 3.8 12/07/2019   CALCIUM 9.0 12/07/2019   GFRAA >60 12/07/2019    Speciality Comments: No specialty comments available.  Procedures:  No procedures performed Allergies: Lisinopril   Assessment / Plan:     Visit Diagnoses: Positive ANA (antinuclear antibody) - 06/23/19: ANA 1:80H, ENA negative, RF<10, CCP 11.  Patient has no clinical features of lupus.  She gives history of oral ulcers and dry mouth and dry eyes.  Which could be related to the medications.  I will obtain AVISE labs.  There is no family history of autoimmune disease.  Primary osteoarthritis of both knees-I reviewed her x-rays from January 2021.  Which were consistent with osteoarthritis.  She states she has been followed by Dr. August Saucer and had several cortisone injections in the past.  A handout on knee joint exercises was given.  DDD (degenerative disc disease), thoracic-I reviewed her x-rays from January 2021 which were consistent with degenerative disc disease of thoracic spine.  She has severe chronic pain.  A handout on back exercises was given.  DDD (degenerative disc disease), lumbar-x-rays of her lumbar spine from January 2021 were  reviewed which were also consistent with degenerative disc disease and facet joint arthropathy.  She has severe chronic pain.  A handout on back exercises was given.  She will benefit from regular exercise and weight loss.  Chronic pain syndrome-patient has been going to pain management.  Which was managing her discomfort.  She states now her insurance is not covering her pain management.  She is in severe pain and discomfort.  She may have to establish with pain management again.  Common migraine with intractable migraine-she has chronic migraine headaches.  Type 2 diabetes mellitus without complication, without long-term current use of insulin (HCC)  BIH (benign intracranial hypertension)  History of gastroesophageal reflux (GERD)  History of asthma  History of COPD  Smoker - 3 to 4 cigarettes a day for 40 years-smoking cessation was discussed.  I reviewed CT scan of her chest from January 2021 which was consistent with bronchitis.  Smoking cessation was discussed.  Educated about COVID-19 virus infection-she has had one vaccine for measured.  I have advised her to complete her vaccination series.  Use of mask, social distancing and hand hygiene was emphasized.  Due to COPD and high BMI she is at high risk from complications from COVID-19.  Orders: No orders of the defined types were placed in this encounter.  No orders of the defined types were placed in this encounter.     Follow-Up Instructions: Return for Generalized pain, DDD, OA.   Pollyann Savoy, MD  Note - This record has been created using Animal nutritionist.  Chart creation errors have been sought, but may not always  have been located. Such creation errors do not reflect on  the standard of medical care.

## 2019-12-26 ENCOUNTER — Ambulatory Visit: Payer: Self-pay

## 2019-12-26 ENCOUNTER — Telehealth (INDEPENDENT_AMBULATORY_CARE_PROVIDER_SITE_OTHER): Payer: Self-pay | Admitting: Primary Care

## 2019-12-26 NOTE — Telephone Encounter (Signed)
Pt. Calling to say she her pharmacy never received an order for a new glucometer and strips. Ms. Randa Evens has left for the day. Spoke with Psychologist, occupational at [facility]. She will see if a provider there will send an order in to pharmacy.

## 2019-12-26 NOTE — Telephone Encounter (Signed)
Pt called stating that she is needing to have a prescription sent into the pharmacy for a new diabetic meter and strips. Pt states that she believes that her blood sugar is running high and that she has not been able to check it in a while. Pt states that she has been calling about this for days. Please advise.      [facility] - [place], [place] - [address]  [address] [place] [address]  Phone: [phone] Fax: [phone]  Hours: Not open 24 hours

## 2019-12-29 ENCOUNTER — Other Ambulatory Visit (INDEPENDENT_AMBULATORY_CARE_PROVIDER_SITE_OTHER): Payer: Self-pay | Admitting: Primary Care

## 2019-12-29 DIAGNOSIS — E119 Type 2 diabetes mellitus without complications: Secondary | ICD-10-CM

## 2019-12-29 MED ORDER — BLOOD GLUCOSE METER KIT: PACK | 0 refills | Status: DC

## 2019-12-29 MED ORDER — ACCU-CHEK SOFT TOUCH LANCETS MISC: 12 refills | Status: DC

## 2019-12-29 MED ORDER — ACCU-CHEK AVIVA PLUS VI STRP: ORAL_STRIP | 12 refills | Status: DC

## 2019-12-29 MED ORDER — ACCU-CHEK SOFTCLIX LANCETS MISC: 12 refills | Status: DC

## 2019-12-29 NOTE — Telephone Encounter (Signed)
Sent to PCP ?

## 2020-01-05 ENCOUNTER — Encounter (INDEPENDENT_AMBULATORY_CARE_PROVIDER_SITE_OTHER): Payer: Self-pay | Admitting: Primary Care

## 2020-01-05 ENCOUNTER — Other Ambulatory Visit: Payer: Self-pay

## 2020-01-05 ENCOUNTER — Ambulatory Visit (INDEPENDENT_AMBULATORY_CARE_PROVIDER_SITE_OTHER): Payer: Medicaid Other | Admitting: Primary Care

## 2020-01-05 ENCOUNTER — Ambulatory Visit (INDEPENDENT_AMBULATORY_CARE_PROVIDER_SITE_OTHER): Payer: Medicaid Other | Admitting: Rheumatology

## 2020-01-05 ENCOUNTER — Encounter: Payer: Self-pay | Admitting: Rheumatology

## 2020-01-05 VITALS — BP 157/77 | HR 92 | Temp 97.5°F | Ht 65.5 in | Wt 251.6 lb

## 2020-01-05 VITALS — BP 146/87 | HR 80 | Resp 17 | Ht 65.5 in | Wt 251.0 lb

## 2020-01-05 DIAGNOSIS — M51369 Other intervertebral disc degeneration, lumbar region without mention of lumbar back pain or lower extremity pain: Secondary | ICD-10-CM

## 2020-01-05 DIAGNOSIS — M5134 Other intervertebral disc degeneration, thoracic region: Secondary | ICD-10-CM | POA: Diagnosis not present

## 2020-01-05 DIAGNOSIS — R768 Other specified abnormal immunological findings in serum: Secondary | ICD-10-CM

## 2020-01-05 DIAGNOSIS — E119 Type 2 diabetes mellitus without complications: Secondary | ICD-10-CM

## 2020-01-05 DIAGNOSIS — Z8719 Personal history of other diseases of the digestive system: Secondary | ICD-10-CM

## 2020-01-05 DIAGNOSIS — M5136 Other intervertebral disc degeneration, lumbar region: Secondary | ICD-10-CM

## 2020-01-05 DIAGNOSIS — I10 Essential (primary) hypertension: Secondary | ICD-10-CM | POA: Diagnosis not present

## 2020-01-05 DIAGNOSIS — G894 Chronic pain syndrome: Secondary | ICD-10-CM

## 2020-01-05 DIAGNOSIS — R7689 Other specified abnormal immunological findings in serum: Secondary | ICD-10-CM

## 2020-01-05 DIAGNOSIS — Z8709 Personal history of other diseases of the respiratory system: Secondary | ICD-10-CM

## 2020-01-05 DIAGNOSIS — M2550 Pain in unspecified joint: Secondary | ICD-10-CM

## 2020-01-05 DIAGNOSIS — Z7189 Other specified counseling: Secondary | ICD-10-CM

## 2020-01-05 DIAGNOSIS — F17200 Nicotine dependence, unspecified, uncomplicated: Secondary | ICD-10-CM

## 2020-01-05 DIAGNOSIS — G43019 Migraine without aura, intractable, without status migrainosus: Secondary | ICD-10-CM

## 2020-01-05 DIAGNOSIS — M170 Bilateral primary osteoarthritis of knee: Secondary | ICD-10-CM | POA: Diagnosis not present

## 2020-01-05 DIAGNOSIS — E785 Hyperlipidemia, unspecified: Secondary | ICD-10-CM

## 2020-01-05 DIAGNOSIS — G932 Benign intracranial hypertension: Secondary | ICD-10-CM

## 2020-01-05 LAB — GLUCOSE, POCT (MANUAL RESULT ENTRY): POC Glucose: 212 mg/dl — AB (ref 70–99)

## 2020-01-05 LAB — POCT GLYCOSYLATED HEMOGLOBIN (HGB A1C): Hemoglobin A1C: 8.6 % — AB (ref 4.0–5.6)

## 2020-01-05 MED ORDER — METFORMIN HCL 1000 MG PO TABS: ORAL_TABLET | ORAL | 0 refills | Status: DC

## 2020-01-05 MED ORDER — NOVOLOG FLEXPEN 100 UNIT/ML ~~LOC~~ SOPN: PEN_INJECTOR | SUBCUTANEOUS | 3 refills | Status: DC

## 2020-01-05 MED ORDER — LOSARTAN POTASSIUM 25 MG PO TABS: 25.0000 mg | ORAL_TABLET | Freq: Every day | ORAL | 1 refills | Status: DC

## 2020-01-05 MED ORDER — GABAPENTIN 300 MG PO CAPS: 300.0000 mg | ORAL_CAPSULE | Freq: Three times a day (TID) | ORAL | 1 refills | Status: DC

## 2020-01-05 MED ORDER — PRAVASTATIN SODIUM 10 MG PO TABS: 10.0000 mg | ORAL_TABLET | Freq: Every day | ORAL | 1 refills | Status: DC

## 2020-01-05 MED ORDER — BLOOD GLUCOSE METER KIT: PACK | 0 refills | Status: AC

## 2020-01-05 MED ORDER — GLIMEPIRIDE 4 MG PO TABS: 4.0000 mg | ORAL_TABLET | Freq: Every day | ORAL | 1 refills | Status: DC

## 2020-01-05 NOTE — Progress Notes (Signed)
Established Patient Office Visit  Subjective:  Patient ID: [name], female    DOB: [birth year]  Age: 50 y.o. MRN: [id number]  CC:  Chief Complaint  Patient presents with  . Diabetes    HPI [name] is a 49 year old female who presents for management of type 2 diabetes.  She was seen earlier today by rheumatology and given a diagnosis of lupus and will return for further lab.  Patient was very disturbed that I did not return any of her calls she stated she kept talking to someone man and left messages to please call her.  Also, was not able to get out of bed or move 3 days.  Question why her call was not returned.  Reviewed chart patient presents no message received other than for diabetic equipment that was sent 12/29/2019  Past Medical History:  Diagnosis Date  . Anxiety   . Common migraine with intractable migraine 10/02/2016  . Depression   . Diabetes mellitus   . Hypertension     Past Surgical History:  Procedure Laterality Date  . CHOLECYSTECTOMY    . TUBAL LIGATION      Family History  Problem Relation Age of Onset  . Diabetes Mother   . Hypertension Mother   . Renal Disease Mother   . Cancer Father   . Diabetes Sister   . Diabetes Brother   . Renal Disease Brother   . Diabetes Brother   . Diabetes Sister   . Diabetes Brother   . Diabetes Brother   . Diabetes Brother   . Healthy Daughter   . Healthy Daughter     Social History   Socioeconomic History  . Marital status: Single    Spouse name: Not on file  . Number of children: Not on file  . Years of education: Not on file  . Highest education level: Not on file  Occupational History  . Not on file  Tobacco Use  . Smoking status: Current Every Day Smoker    Packs/day: 0.24    Types: Cigarettes  . Smokeless tobacco: Never Used  Vaping Use  . Vaping Use: Former  Substance and Sexual Activity  . Alcohol use: No  . Drug use: No  . Sexual activity: Yes    Birth control/protection:  None  Other Topics Concern  . Not on file  Social History Narrative   Right handed    Lives with daughter   Caffeine use: Drinks coffee/tea/soda sometimes   Social Determinants of Health   Financial Resource Strain:   . Difficulty of Paying Living Expenses: Not on file  Food Insecurity:   . Worried About Charity fundraiser in the Last Year: Not on file  . Ran Out of Food in the Last Year: Not on file  Transportation Needs:   . Lack of Transportation (Medical): Not on file  . Lack of Transportation (Non-Medical): Not on file  Physical Activity:   . Days of Exercise per Week: Not on file  . Minutes of Exercise per Session: Not on file  Stress:   . Feeling of Stress : Not on file  Social Connections:   . Frequency of Communication with Friends and Family: Not on file  . Frequency of Social Gatherings with Friends and Family: Not on file  . Attends Religious Services: Not on file  . Active Member of Clubs or Organizations: Not on file  . Attends Archivist Meetings: Not on file  . Marital Status: Not  on file  Intimate Partner Violence:   . Fear of Current or Ex-Partner: Not on file  . Emotionally Abused: Not on file  . Physically Abused: Not on file  . Sexually Abused: Not on file    Outpatient Medications Prior to Visit  Medication Sig Dispense Refill  . Accu-Chek Softclix Lancets lancets Use as instructed 100 each 12  . acetaminophen (TYLENOL) 325 MG tablet Take 2 tablets (650 mg total) by mouth every 6 (six) hours as needed. 30 tablet 0  . aspirin EC 81 MG tablet Take 81 mg by mouth every 6 (six) hours as needed for mild pain.     . butalbital-acetaminophen-caffeine (FIORICET) 50-325-40 MG tablet Take 1 tablet by mouth every 6 (six) hours as needed for headache. 20 tablet 0  . celecoxib (CELEBREX) 200 MG capsule TAKE 1 CAPSULE BY MOUTH 2 TIMES DAILY. 180 capsule 0  . EASY COMFORT PEN NEEDLES 31G X 5 MM MISC USE AS INSTRUCTED. TWICE DAILY. 100 each 1  .  fluticasone (FLOVENT HFA) 44 MCG/ACT inhaler Inhale 2 puffs into the lungs 2 (two) times daily. 1 Inhaler 12  . glucose blood (ACCU-CHEK AVIVA PLUS) test strip Use as instructed 100 each 12  . insulin detemir (LEVEMIR FLEXTOUCH) 100 UNIT/ML FlexPen INJECT 50 UNITS INTO THE SKIN 2 (TWO) TIMES DAILY. 30 mL 3  . Lancets (ACCU-CHEK SOFT TOUCH) lancets Use as instructed 100 each 12  . PROVENTIL HFA 108 (90 Base) MCG/ACT inhaler INHALE 1 TO 2 PUFFS BY MOUTH EVERY 6 (SIX) HOURS AS NEEDED FOR WHEEZING OR SHORTNESS OF BREATH. (Patient taking differently: Inhale 1-2 puffs into the lungs every 6 (six) hours as needed for wheezing or shortness of breath. ) 6.7 g 1  . blood glucose meter kit and supplies Dispense based on patient and insurance preference. Use up to four times daily as directed. (FOR ICD-10 E10.9, E11.9). 1 each 0  . glimepiride (AMARYL) 4 MG tablet TAKE 1 TABLET BY MOUTH DAILY BEFORE BREAKFAST. 90 tablet 0  . losartan (COZAAR) 25 MG tablet TAKE 1 TABLET BY MOUTH DAILY. 90 tablet 1  . metFORMIN (GLUCOPHAGE) 1000 MG tablet TAKE 1 TABLET BY MOUTH 2 TIMES DAILY WITH A MEAL. 180 tablet 0  . NOVOLOG FLEXPEN 100 UNIT/ML FlexPen SMARTSIG:10 Unit(s) SUB-Q 3 Times Daily    . pravastatin (PRAVACHOL) 10 MG tablet TAKE 1 TABLET BY MOUTH DAILY. 90 tablet 1  . furosemide (LASIX) 20 MG tablet TAKE 2 TABLETS DAILY (Patient taking differently: Take 40 mg by mouth every evening. ) 60 tablet 0  . ibuprofen (ADVIL) 800 MG tablet Take 1 tablet (800 mg total) by mouth every 8 (eight) hours as needed for mild pain. 30 tablet 0  . ondansetron (ZOFRAN) 4 MG tablet Take 1 tablet (4 mg total) by mouth every 6 (six) hours. (Patient not taking: Reported on 01/05/2020) 30 tablet 0   No facility-administered medications prior to visit.    Allergies  Allergen Reactions  . Lisinopril     angioedema    ROS Review of Systems  Musculoskeletal: Positive for arthralgias and myalgias.  All other systems reviewed and are  negative.     Objective:    Physical Exam Vitals reviewed.  Constitutional:      Appearance: She is obese.  HENT:     Head: Normocephalic.     Nose: Nose normal.  Eyes:     Extraocular Movements: Extraocular movements intact.  Cardiovascular:     Rate and Rhythm: Normal rate and regular  rhythm.     Pulses: Normal pulses.     Heart sounds: Normal heart sounds.  Pulmonary:     Effort: Pulmonary effort is normal.     Breath sounds: Normal breath sounds.  Abdominal:     General: Bowel sounds are normal. There is distension.  Musculoskeletal:        General: Tenderness present.     Cervical back: Normal range of motion and neck supple.  Skin:    General: Skin is warm.  Neurological:     Mental Status: She is alert and oriented to person, place, and time.  Psychiatric:        Mood and Affect: Mood normal.        Behavior: Behavior normal.        Thought Content: Thought content normal.        Judgment: Judgment normal.     BP (!) 157/77 (BP Location: Left Arm, Patient Position: Sitting, Cuff Size: Large)   Pulse 92   Temp (!) 97.5 F (36.4 C) (Temporal)   Ht 5' 5.5" (1.664 m)   Wt 251 lb 9.6 oz (114.1 kg)   SpO2 99%   BMI 41.23 kg/m  Wt Readings from Last 3 Encounters:  01/05/20 251 lb 9.6 oz (114.1 kg)  01/05/20 251 lb (113.9 kg)  12/16/19 240 lb (108.9 kg)     Health Maintenance Due  Topic Date Due  . PNEUMOCOCCAL POLYSACCHARIDE VACCINE AGE 5-64 HIGH RISK  Never done  . FOOT EXAM  Never done  . COLONOSCOPY  Never done  . OPHTHALMOLOGY EXAM  10/20/2018  . INFLUENZA VACCINE  Never done  . COVID-19 Vaccine (2 - Moderna 2-dose series) 01/02/2020    There are no preventive care reminders to display for this patient.  Lab Results  Component Value Date   TSH 2.870 10/02/2018   Lab Results  Component Value Date   WBC 13.7 (H) 12/07/2019   HGB 13.5 12/07/2019   HCT 40.4 12/07/2019   MCV 75.0 (L) 12/07/2019   PLT 262 12/07/2019   Lab Results   Component Value Date   NA 143 12/07/2019   K 3.4 (L) 12/07/2019   CO2 25 12/07/2019   GLUCOSE 127 (H) 12/07/2019   BUN 10 12/07/2019   CREATININE 0.72 12/07/2019   BILITOT 0.4 12/07/2019   ALKPHOS 133 (H) 12/07/2019   AST 26 12/07/2019   ALT 47 (H) 12/07/2019   PROT 7.4 12/07/2019   ALBUMIN 3.8 12/07/2019   CALCIUM 9.0 12/07/2019   ANIONGAP 10 12/07/2019   Lab Results  Component Value Date   CHOL 185 08/01/2018   Lab Results  Component Value Date   HDL 40 08/01/2018   Lab Results  Component Value Date   LDLCALC 125 (H) 08/01/2018   Lab Results  Component Value Date   TRIG 102 08/01/2018   Lab Results  Component Value Date   CHOLHDL 4.6 (H) 08/01/2018   Lab Results  Component Value Date   HGBA1C 8.6 (A) 01/05/2020      Assessment & Plan:  Chen was seen today for diabetes.  Diagnoses and all orders for this visit: Type 2 diabetes mellitus without complication, with long-term current use of insulin (HCC)  Goal of therapy: Less than 6.5 hemoglobin A1c.  Foods that are high in carbohydrates are the following rice, potatoes, breads, sugars, and pastas.  Reduction in the intake (eating) will assist in lowering your blood sugars. -     HgB A1c 8.6 -  Glucose (CBG) -     metFORMIN (GLUCOPHAGE) 1000 MG tablet; TAKE 1 TABLET BY MOUTH 2 TIMES DAILY WITH A MEAL. -     NOVOLOG FLEXPEN 100 UNIT/ML FlexPen; For blood sugars 0-150 give 0 units of insulin, 200-250 give 2 units of insulin, 251-300 give 4 units, 300-351 give 6 units, 351-400 give 8 units, 401-450 give 10 units,> 451 give 12 units and call M.D. Discussed hypoglycemia protocol. -     glimepiride (AMARYL) 4 MG tablet; Take 1 tablet (4 mg total) by mouth daily with breakfast.  Arthralgia, unspecified joint -     gabapentin (NEURONTIN) 300 MG capsule; Take 1 capsule (300 mg total) by mouth 3 (three) times daily.  Essential hypertension Blood Pressure is not a goal 130/80 -started grams daily for blood pressure  and renal protection. Discussed eating a DASH diet and what it consisted of.     losartan (COZAAR) 25 MG tablet; Take 1 tablet (25 mg total) by mouth daily.  Hyperlipidemia, unspecified hyperlipidemia type -     pravastatin (PRAVACHOL) 10 MG tablet; Take 1 tablet (10 mg total) by mouth daily.  Other orders -     blood glucose meter kit and supplies; Dispense based on patient and insurance preference. Use up to four times daily as directed. (FOR ICD-10 E10.9, E11.9).    Meds ordered this encounter  Medications  . gabapentin (NEURONTIN) 300 MG capsule    Sig: Take 1 capsule (300 mg total) by mouth 3 (three) times daily.    Dispense:  90 capsule    Refill:  1  . blood glucose meter kit and supplies    Sig: Dispense based on patient and insurance preference. Use up to four times daily as directed. (FOR ICD-10 E10.9, E11.9).    Dispense:  1 each    Refill:  0    Order Specific Question:   Number of strips    Answer:   100    Order Specific Question:   Number of lancets    Answer:   100  . metFORMIN (GLUCOPHAGE) 1000 MG tablet    Sig: TAKE 1 TABLET BY MOUTH 2 TIMES DAILY WITH A MEAL.    Dispense:  180 tablet    Refill:  0  . NOVOLOG FLEXPEN 100 UNIT/ML FlexPen    Sig: For blood sugars 0-150 give 0 units of insulin, 200-250 give 2 units of insulin, 251-300 give 4 units, 300-351 give 6 units, 351-400 give 8 units, 401-450 give 10 units,> 451 give 12 units and call M.D. Discussed hypoglycemia protocol.    Dispense:  15 mL    Refill:  3  . glimepiride (AMARYL) 4 MG tablet    Sig: Take 1 tablet (4 mg total) by mouth daily with breakfast.    Dispense:  90 tablet    Refill:  1  . losartan (COZAAR) 25 MG tablet    Sig: Take 1 tablet (25 mg total) by mouth daily.    Dispense:  90 tablet    Refill:  1  . pravastatin (PRAVACHOL) 10 MG tablet    Sig: Take 1 tablet (10 mg total) by mouth daily.    Dispense:  90 tablet    Refill:  1    Follow-up: Return in about 3 months (around  04/05/2020) for DM.    Kerin Perna, NP

## 2020-01-05 NOTE — Patient Instructions (Signed)
Journal for Nurse Practitioners, 15(4), 263-267. Retrieved January 14, 2018 from [email or web address]">  °Knee Exercises °Ask your health care provider which exercises are safe for you. Do exercises exactly as told by your health care provider and adjust them as directed. It is normal to feel mild stretching, pulling, tightness, or discomfort as you do these exercises. Stop right away if you feel sudden pain or your pain gets worse. Do not begin these exercises until told by your health care provider. °Stretching and range-of-motion exercises °These exercises warm up your muscles and joints and improve the movement and flexibility of your knee. These exercises also help to relieve pain and swelling. °Knee extension, prone °1. Lie on your abdomen (prone position) on a bed. °2. Place your left / right knee just beyond the edge of the surface so your knee is not on the bed. You can put a towel under your left / right thigh just above your kneecap for comfort. °3. Relax your leg muscles and allow gravity to straighten your knee (extension). You should feel a stretch behind your left / right knee. °4. Hold this position for __________ seconds. °5. Scoot up so your knee is supported between repetitions. °Repeat __________ times. Complete this exercise __________ times a day. °Knee flexion, active ° °1. Lie on your back with both legs straight. If this causes back discomfort, bend your left / right knee so your foot is flat on the floor. °2. Slowly slide your left / right heel back toward your buttocks. Stop when you feel a gentle stretch in the front of your knee or thigh (flexion). °3. Hold this position for __________ seconds. °4. Slowly slide your left / right heel back to the starting position. °Repeat __________ times. Complete this exercise __________ times a day. °Quadriceps stretch, prone ° °1. Lie on your abdomen on a firm surface, such as a bed or padded floor. °2. Bend your left / right knee and hold  your ankle. If you cannot reach your ankle or pant leg, loop a belt around your foot and grab the belt instead. °3. Gently pull your heel toward your buttocks. Your knee should not slide out to the side. You should feel a stretch in the front of your thigh and knee (quadriceps). °4. Hold this position for __________ seconds. °Repeat __________ times. Complete this exercise __________ times a day. °Hamstring, supine °1. Lie on your back (supine position). °2. Loop a belt or towel over the ball of your left / right foot. The ball of your foot is on the walking surface, right under your toes. °3. Straighten your left / right knee and slowly pull on the belt to raise your leg until you feel a gentle stretch behind your knee (hamstring). °? Do not let your knee bend while you do this. °? Keep your other leg flat on the floor. °4. Hold this position for __________ seconds. °Repeat __________ times. Complete this exercise __________ times a day. °Strengthening exercises °These exercises build strength and endurance in your knee. Endurance is the ability to use your muscles for a long time, even after they get tired. °Quadriceps, isometric °This exercise stretches the muscles in front of your thigh (quadriceps) without moving your knee joint (isometric). °1. Lie on your back with your left / right leg extended and your other knee bent. Put a rolled towel or small pillow under your knee if told by your health care provider. °2. Slowly tense the muscles in the front of your left /   right thigh. You should see your kneecap slide up toward your hip or see increased dimpling just above the knee. This motion will push the back of the knee toward the floor. °3. For __________ seconds, hold the muscle as tight as you can without increasing your pain. °4. Relax the muscles slowly and completely. °Repeat __________ times. Complete this exercise __________ times a day. °Straight leg raises °This exercise stretches the muscles in front  of your thigh (quadriceps) and the muscles that move your hips (hip flexors). °1. Lie on your back with your left / right leg extended and your other knee bent. °2. Tense the muscles in the front of your left / right thigh. You should see your kneecap slide up or see increased dimpling just above the knee. Your thigh may even shake a bit. °3. Keep these muscles tight as you raise your leg 4-6 inches (10-15 cm) off the floor. Do not let your knee bend. °4. Hold this position for __________ seconds. °5. Keep these muscles tense as you lower your leg. °6. Relax your muscles slowly and completely after each repetition. °Repeat __________ times. Complete this exercise __________ times a day. °Hamstring, isometric °1. Lie on your back on a firm surface. °2. Bend your left / right knee about __________ degrees. °3. Dig your left / right heel into the surface as if you are trying to pull it toward your buttocks. Tighten the muscles in the back of your thighs (hamstring) to "dig" as hard as you can without increasing any pain. °4. Hold this position for __________ seconds. °5. Release the tension gradually and allow your muscles to relax completely for __________ seconds after each repetition. °Repeat __________ times. Complete this exercise __________ times a day. °Hamstring curls °If told by your health care provider, do this exercise while wearing ankle weights. Begin with __________ lb weights. Then increase the weight by 1 lb (0.5 kg) increments. Do not wear ankle weights that are more than __________ lb. °1. Lie on your abdomen with your legs straight. °2. Bend your left / right knee as far as you can without feeling pain. Keep your hips flat against the floor. °3. Hold this position for __________ seconds. °4. Slowly lower your leg to the starting position. °Repeat __________ times. Complete this exercise __________ times a day. °Squats °This exercise strengthens the muscles in front of your thigh and knee  (quadriceps). °1. Stand in front of a table, with your feet and knees pointing straight ahead. You may rest your hands on the table for balance but not for support. °2. Slowly bend your knees and lower your hips like you are going to sit in a chair. °? Keep your weight over your heels, not over your toes. °? Keep your lower legs upright so they are parallel with the table legs. °? Do not let your hips go lower than your knees. °? Do not bend lower than told by your health care provider. °? If your knee pain increases, do not bend as low. °3. Hold the squat position for __________ seconds. °4. Slowly push with your legs to return to standing. Do not use your hands to pull yourself to standing. °Repeat __________ times. Complete this exercise __________ times a day. °Wall slides °This exercise strengthens the muscles in front of your thigh and knee (quadriceps). °1. Lean your back against a smooth wall or door, and walk your feet out 18-24 inches (46-61 cm) from it. °2. Place your feet hip-width apart. °3.   Slowly slide down the wall or door until your knees bend __________ degrees. Keep your knees over your heels, not over your toes. Keep your knees in line with your hips. °4. Hold this position for __________ seconds. °Repeat __________ times. Complete this exercise __________ times a day. °Straight leg raises °This exercise strengthens the muscles that rotate the leg at the hip and move it away from your body (hip abductors). °1. Lie on your side with your left / right leg in the top position. Lie so your head, shoulder, knee, and hip line up. You may bend your bottom knee to help you keep your balance. °2. Roll your hips slightly forward so your hips are stacked directly over each other and your left / right knee is facing forward. °3. Leading with your heel, lift your top leg 4-6 inches (10-15 cm). You should feel the muscles in your outer hip lifting. °? Do not let your foot drift forward. °? Do not let your knee  roll toward the ceiling. °4. Hold this position for __________ seconds. °5. Slowly return your leg to the starting position. °6. Let your muscles relax completely after each repetition. °Repeat __________ times. Complete this exercise __________ times a day. °Straight leg raises °This exercise stretches the muscles that move your hips away from the front of the pelvis (hip extensors). °1. Lie on your abdomen on a firm surface. You can put a pillow under your hips if that is more comfortable. °2. Tense the muscles in your buttocks and lift your left / right leg about 4-6 inches (10-15 cm). Keep your knee straight as you lift your leg. °3. Hold this position for __________ seconds. °4. Slowly lower your leg to the starting position. °5. Let your leg relax completely after each repetition. °Repeat __________ times. Complete this exercise __________ times a day. °This information is not intended to replace advice given to you by your health care provider. Make sure you discuss any questions you have with your health care provider. °Document Revised: 01/15/2018 Document Reviewed: 01/15/2018 °[employer] Patient Education © 2020 [employer] ° °Back Exercises °The following exercises strengthen the muscles that help to support the trunk and back. They also help to keep the lower back flexible. Doing these exercises can help to prevent back pain or lessen existing pain. °· If you have back pain or discomfort, try doing these exercises 2-3 times each day or as told by your health care provider. °· As your pain improves, do them once each day, but increase the number of times that you repeat the steps for each exercise (do more repetitions). °· To prevent the recurrence of back pain, continue to do these exercises once each day or as told by your health care provider. °Do exercises exactly as told by your health care provider and adjust them as directed. It is normal to feel mild stretching, pulling, tightness, or discomfort  as you do these exercises, but you should stop right away if you feel sudden pain or your pain gets worse. °Exercises °Single knee to chest °Repeat these steps 3-5 times for each leg: °1. Lie on your back on a firm bed or the floor with your legs extended. °2. Bring one knee to your chest. Your other leg should stay extended and in contact with the floor. °3. Hold your knee in place by grabbing your knee or thigh with both hands and hold. °4. Pull on your knee until you feel a gentle stretch in your lower back   or buttocks. °5. Hold the stretch for 10-30 seconds. °6. Slowly release and straighten your leg. °Pelvic tilt °Repeat these steps 5-10 times: °1. Lie on your back on a firm bed or the floor with your legs extended. °2. Bend your knees so they are pointing toward the ceiling and your feet are flat on the floor. °3. Tighten your lower abdominal muscles to press your lower back against the floor. This motion will tilt your pelvis so your tailbone points up toward the ceiling instead of pointing to your feet or the floor. °4. With gentle tension and even breathing, hold this position for 5-10 seconds. °Cat-cow °Repeat these steps until your lower back becomes more flexible: °1. Get into a hands-and-knees position on a firm surface. Keep your hands under your shoulders, and keep your knees under your hips. You may place padding under your knees for comfort. °2. Let your head hang down toward your chest. Contract your abdominal muscles and point your tailbone toward the floor so your lower back becomes rounded like the back of a cat. °3. Hold this position for 5 seconds. °4. Slowly lift your head, let your abdominal muscles relax and point your tailbone up toward the ceiling so your back forms a sagging arch like the back of a cow. °5. Hold this position for 5 seconds. ° °Press-ups °Repeat these steps 5-10 times: °1. Lie on your abdomen (face-down) on the floor. °2. Place your palms near your head, about  shoulder-width apart. °3. Keeping your back as relaxed as possible and keeping your hips on the floor, slowly straighten your arms to raise the top half of your body and lift your shoulders. Do not use your back muscles to raise your upper torso. You may adjust the placement of your hands to make yourself more comfortable. °4. Hold this position for 5 seconds while you keep your back relaxed. °5. Slowly return to lying flat on the floor. ° °Bridges °Repeat these steps 10 times: °1. Lie on your back on a firm surface. °2. Bend your knees so they are pointing toward the ceiling and your feet are flat on the floor. Your arms should be flat at your sides, next to your body. °3. Tighten your buttocks muscles and lift your buttocks off the floor until your waist is at almost the same height as your knees. You should feel the muscles working in your buttocks and the back of your thighs. If you do not feel these muscles, slide your feet 1-2 inches farther away from your buttocks. °4. Hold this position for 3-5 seconds. °5. Slowly lower your hips to the starting position, and allow your buttocks muscles to relax completely. °If this exercise is too easy, try doing it with your arms crossed over your chest. °Abdominal crunches °Repeat these steps 5-10 times: °1. Lie on your back on a firm bed or the floor with your legs extended. °2. Bend your knees so they are pointing toward the ceiling and your feet are flat on the floor. °3. Cross your arms over your chest. °4. Tip your chin slightly toward your chest without bending your neck. °5. Tighten your abdominal muscles and slowly raise your trunk (torso) high enough to lift your shoulder blades a tiny bit off the floor. Avoid raising your torso higher than that because it can put too much stress on your low back and does not help to strengthen your abdominal muscles. °6. Slowly return to your starting position. °Back lifts °Repeat these steps 5-10   times: °1. Lie on your abdomen  (face-down) with your arms at your sides, and rest your forehead on the floor. °2. Tighten the muscles in your legs and your buttocks. °3. Slowly lift your chest off the floor while you keep your hips pressed to the floor. Keep the back of your head in line with the curve in your back. Your eyes should be looking at the floor. °4. Hold this position for 3-5 seconds. °5. Slowly return to your starting position. °Contact a health care provider if: °· Your back pain or discomfort gets much worse when you do an exercise. °· Your worsening back pain or discomfort does not lessen within 2 hours after you exercise. °If you have any of these problems, stop doing these exercises right away. Do not do them again unless your health care provider says that you can. °Get help right away if: °· You develop sudden, severe back pain. If this happens, stop doing the exercises right away. Do not do them again unless your health care provider says that you can. °This information is not intended to replace advice given to you by your health care provider. Make sure you discuss any questions you have with your health care provider. °Document Revised: 08/01/2018 Document Reviewed: 12/27/2017 °[employer]vier Patient Education © 2020 [employer] ° °

## 2020-01-05 NOTE — Patient Instructions (Signed)

## 2020-01-08 ENCOUNTER — Telehealth (INDEPENDENT_AMBULATORY_CARE_PROVIDER_SITE_OTHER): Payer: Self-pay | Admitting: Primary Care

## 2020-01-08 ENCOUNTER — Telehealth (INDEPENDENT_AMBULATORY_CARE_PROVIDER_SITE_OTHER): Payer: Self-pay

## 2020-01-08 NOTE — Telephone Encounter (Signed)
Patient called stating that gabapentin is not helping her pain. She is asking if she could get a prescription for ibuprofen. Please advise and send Rx if appropriate. [name], CMA

## 2020-01-13 ENCOUNTER — Other Ambulatory Visit (INDEPENDENT_AMBULATORY_CARE_PROVIDER_SITE_OTHER): Payer: Self-pay | Admitting: Primary Care

## 2020-01-13 DIAGNOSIS — G8929 Other chronic pain: Secondary | ICD-10-CM

## 2020-01-13 MED ORDER — IBUPROFEN 600 MG PO TABS: 600.0000 mg | ORAL_TABLET | Freq: Three times a day (TID) | ORAL | 0 refills | Status: DC | PRN

## 2020-01-13 NOTE — Telephone Encounter (Signed)
Called Ms. Niznik to inform ibuprofen sent in

## 2020-01-22 ENCOUNTER — Telehealth (INDEPENDENT_AMBULATORY_CARE_PROVIDER_SITE_OTHER): Payer: Self-pay | Admitting: Primary Care

## 2020-01-22 NOTE — Telephone Encounter (Signed)
Sent to PCP to place orders if appropriate.

## 2020-01-22 NOTE — Telephone Encounter (Signed)
Patient is calling to request a walker she is having difficulty walking and her body is hurting with the lupus. Patient is open to use [facility].   Patient is also requesting an order for home health health to assist with Bathing and cleaning, fix her food.   Please advise when the order is placed Cb- [phone]

## 2020-01-23 ENCOUNTER — Other Ambulatory Visit (INDEPENDENT_AMBULATORY_CARE_PROVIDER_SITE_OTHER): Payer: Self-pay | Admitting: Primary Care

## 2020-01-23 DIAGNOSIS — G8929 Other chronic pain: Secondary | ICD-10-CM

## 2020-01-23 NOTE — Telephone Encounter (Signed)
Patient needs to pick up order and take it to a medical equipment supplier if not where does she want the scrip to be faxed to

## 2020-01-24 NOTE — Progress Notes (Signed)
Office Visit Note  Patient: [name]             Date of Birth: December 09, 1970           MRN: [id number]             PCP: Grayce Sessions, NP Referring: Grayce Sessions, NP Visit Date: 02/03/2020 Occupation: @GUAROCC @  Subjective:  Pain all over the body.   History of Present Illness: [name] is a 49 y.o. female with positive ANA.  She returns today.  She states she has pain all over her body.  She cannot even bear the touch of the clothing on her body.  She has been wearing home slippers as she cannot wear shoes anymore.  She does not tolerate anybody touching her.  She is in constant pain.  She describes her pain on a scale of 0-10 about 10.  She states the pain management did not accept her insurance.  She is trying to find another pain management please.  Activities of Daily Living:  Patient reports morning stiffness for 24 hours.   Patient Reports nocturnal pain.  Difficulty dressing/grooming: Reports Difficulty climbing stairs: Reports Difficulty getting out of chair: Reports Difficulty using hands for taps, buttons, cutlery, and/or writing: Reports  Review of Systems  Constitutional: Positive for fatigue.  HENT: Positive for mouth dryness. Negative for mouth sores and nose dryness.   Eyes: Positive for itching. Negative for dryness.  Respiratory: Negative for cough, hemoptysis and difficulty breathing.   Cardiovascular: Positive for palpitations and swelling in legs/feet.  Gastrointestinal: Negative for abdominal pain, blood in stool, constipation and diarrhea.  Endocrine: Negative for increased urination.  Genitourinary: Negative for painful urination.  Musculoskeletal: Positive for arthralgias, joint pain, myalgias, muscle weakness, morning stiffness, muscle tenderness and myalgias.  Skin: Negative for color change and redness.  Allergic/Immunologic: Negative for susceptible to infections.  Neurological: Positive for dizziness, numbness, headaches,  memory loss and weakness.  Hematological: Negative for swollen glands.  Psychiatric/Behavioral: Positive for confusion and sleep disturbance.    PMFS History:  Patient Active Problem List   Diagnosis Date Noted  . Hot flashes 10/02/2018  . Vaginal dryness 10/02/2018  . BIH (benign intracranial hypertension) 11/06/2017  . Somnolence, daytime 07/30/2017  . Snoring 07/30/2017  . Morbid obesity (HCC) 07/30/2017  . Migraine 10/02/2016  . Common migraine with intractable migraine 10/02/2016  . Type 2 diabetes mellitus without complication, without long-term current use of insulin (HCC) 08/17/2016    Past Medical History:  Diagnosis Date  . Anxiety   . Common migraine with intractable migraine 10/02/2016  . Depression   . Diabetes mellitus   . Hypertension     Family History  Problem Relation Age of Onset  . Diabetes Mother   . Hypertension Mother   . Renal Disease Mother   . Cancer Father   . Diabetes Sister   . Diabetes Brother   . Renal Disease Brother   . Diabetes Brother   . Diabetes Sister   . Diabetes Brother   . Diabetes Brother   . Diabetes Brother   . Healthy Daughter   . Healthy Daughter    Past Surgical History:  Procedure Laterality Date  . CHOLECYSTECTOMY    . TUBAL LIGATION     Social History   Social History Narrative   Right handed    Lives with daughter   Caffeine use: Drinks coffee/tea/soda sometimes   Immunization History  Administered Date(s) Administered  . Moderna SARS-COVID-2 Vaccination 11/25/2019  .  PPD Test 04/30/2018  . Tdap 01/02/2018, 10/14/2019     Objective: Vital Signs: BP 133/85 (BP Location: Left Arm, Patient Position: Sitting, Cuff Size: Small)   Pulse 83   Ht 5' 5.5" (1.664 m)   Wt 250 lb 3.2 oz (113.5 kg)   BMI 41.00 kg/m    Physical Exam Vitals and nursing note reviewed.  Constitutional:      Appearance: She is well-developed.  HENT:     Head: Normocephalic and atraumatic.  Eyes:     Conjunctiva/sclera:  Conjunctivae normal.  Cardiovascular:     Rate and Rhythm: Normal rate and regular rhythm.     Heart sounds: Normal heart sounds.  Pulmonary:     Effort: Pulmonary effort is normal.     Breath sounds: Normal breath sounds.  Abdominal:     General: Bowel sounds are normal.     Palpations: Abdomen is soft.  Musculoskeletal:     Cervical back: Normal range of motion.  Lymphadenopathy:     Cervical: No cervical adenopathy.  Skin:    General: Skin is warm and dry.     Capillary Refill: Capillary refill takes less than 2 seconds.  Neurological:     Mental Status: She is alert and oriented to person, place, and time.  Psychiatric:        Behavior: Behavior normal.      Musculoskeletal Exam: She has limited range of motion of her cervical thoracic and lumbar spine.  Shoulder joints elbow joints wrist joints were all and painful range of motion.  No synovitis noted over wrist joints, MCPs or PIPs.  Hip joints, knee joints, ankle joints with good range of motion with discomfort.  She had generalized hyperalgesia, positive tender points.  CDAI Exam: CDAI Score: -- Patient Global: --; Provider Global: -- Swollen: --; Tender: -- Joint Exam 02/03/2020   No joint exam has been documented for this visit   There is currently no information documented on the homunculus. Go to the Rheumatology activity and complete the homunculus joint exam.  Investigation: No additional findings.  Imaging: No results found.  Recent Labs: Lab Results  Component Value Date   WBC 13.7 (H) 12/07/2019   HGB 13.5 12/07/2019   PLT 262 12/07/2019   NA 143 12/07/2019   K 3.4 (L) 12/07/2019   CL 108 12/07/2019   CO2 25 12/07/2019   GLUCOSE 127 (H) 12/07/2019   BUN 10 12/07/2019   CREATININE 0.72 12/07/2019   BILITOT 0.4 12/07/2019   ALKPHOS 133 (H) 12/07/2019   AST 26 12/07/2019   ALT 47 (H) 12/07/2019   PROT 7.4 12/07/2019   ALBUMIN 3.8 12/07/2019   CALCIUM 9.0 12/07/2019   GFRAA >60 12/07/2019    06/23/19: ANA 1:80H, ENA negative, RF<10, CCP 11  January 06, 2020 AVISE ANA 1: 320 homogeneous, ENA negative, Jo 1 -, RF IgM equivocal, RF IgA negative, anti-CCP negative, anticardiolipin negative, beta-2 GP 1 -, antiphosphatidylserine negative, antihistone positive, antithyroglobulin negative, anti-TPO negative, anti-Carr P-  Speciality Comments: No specialty comments available.  Procedures:  No procedures performed Allergies: Lisinopril   Assessment / Plan:     Visit Diagnoses: Positive ANA (antinuclear antibody) - Positive ANA,AVISE she had positive ANA, ENA negative, complements normal, all other serology was nonspecific.  She gives history of infrequent oral ulcers.  No recent oral ulcers were noted.  She also gives history of dry mouth and dry eyes which probably is related to medications.  She has no clinical features of systemic lupus.  Primary osteoarthritis of both knees - Followed by Dr. August Saucer.  DDD (degenerative disc disease), thoracic - Severe chronic pain.  Treated at pain management.  DDD (degenerative disc disease), lumbar - She has severe chronic pain.  A handout on back exercises was given at the last visit.  She continues to have severe lower back pain.  Chronic pain syndrome - Awaiting appointment at pain management which was discontinued due to insurance issues.  Fibromyalgia syndrome-she has generalized pain discomfort and positive tender points.  She complains of hyperalgesia.  Need for regular exercise, water aerobics and stretching exercises were discussed.  She will discuss further treatment of fibromyalgia with her PCP.  She is already on gabapentin.  She may benefit from muscle relaxers and Cymbalta.  Type 2 diabetes mellitus without complication, without long-term current use of insulin (HCC)  Common migraine with intractable migraine  BIH (benign intracranial hypertension)  History of gastroesophageal reflux (GERD)  History of  COPD  Smoker  History of asthma  Orders: No orders of the defined types were placed in this encounter.  No orders of the defined types were placed in this encounter.     Follow-Up Instructions: Return if symptoms worsen or fail to improve, for FMS, CPS.   Pollyann Savoy, MD  Note - This record has been created using Animal nutritionist.  Chart creation errors have been sought, but may not always  have been located. Such creation errors do not reflect on  the standard of medical care.

## 2020-01-26 NOTE — Telephone Encounter (Signed)
Will [facility] order to [facility]. Patient would like order placed for home health services.

## 2020-01-26 NOTE — Telephone Encounter (Signed)
Left patient message asking her to return call to [facility] at [phone].

## 2020-01-28 ENCOUNTER — Telehealth (INDEPENDENT_AMBULATORY_CARE_PROVIDER_SITE_OTHER): Payer: Self-pay

## 2020-01-28 NOTE — Telephone Encounter (Signed)
Patient is attempting to get rental assistance and needs a letter that identifies her reasoning for being unable to work. [name], CMA

## 2020-01-30 ENCOUNTER — Encounter (INDEPENDENT_AMBULATORY_CARE_PROVIDER_SITE_OTHER): Payer: Self-pay | Admitting: Primary Care

## 2020-02-02 NOTE — Telephone Encounter (Signed)
Patient checking on the status of message below. Stating she has not heard from [facility] and would like to know the status. Patient states she has called several times and she has not received a return call, please advise

## 2020-02-02 NOTE — Telephone Encounter (Signed)
Spoke with patient myself. Letter for work was sent through my chart states she knows nothing about my chart will have family member to pick letter up. Requesting equipment is trying to be competed by our nurse but her disability is managed by another provider. Advised her to leave a message with their nurse too.

## 2020-02-02 NOTE — Telephone Encounter (Signed)
Patient checking on the status of message below and would like a follow up call today regarding the below. Patient states she has left several message and no one is returning call.

## 2020-02-03 ENCOUNTER — Ambulatory Visit (INDEPENDENT_AMBULATORY_CARE_PROVIDER_SITE_OTHER): Payer: Medicaid Other | Admitting: Rheumatology

## 2020-02-03 ENCOUNTER — Encounter: Payer: Self-pay | Admitting: Rheumatology

## 2020-02-03 ENCOUNTER — Other Ambulatory Visit: Payer: Self-pay

## 2020-02-03 ENCOUNTER — Telehealth (INDEPENDENT_AMBULATORY_CARE_PROVIDER_SITE_OTHER): Payer: Self-pay

## 2020-02-03 VITALS — BP 133/85 | HR 83 | Ht 65.5 in | Wt 250.2 lb

## 2020-02-03 DIAGNOSIS — M797 Fibromyalgia: Secondary | ICD-10-CM | POA: Diagnosis not present

## 2020-02-03 DIAGNOSIS — G932 Benign intracranial hypertension: Secondary | ICD-10-CM

## 2020-02-03 DIAGNOSIS — Z8719 Personal history of other diseases of the digestive system: Secondary | ICD-10-CM

## 2020-02-03 DIAGNOSIS — M5136 Other intervertebral disc degeneration, lumbar region: Secondary | ICD-10-CM

## 2020-02-03 DIAGNOSIS — E119 Type 2 diabetes mellitus without complications: Secondary | ICD-10-CM

## 2020-02-03 DIAGNOSIS — R768 Other specified abnormal immunological findings in serum: Secondary | ICD-10-CM | POA: Diagnosis not present

## 2020-02-03 DIAGNOSIS — M170 Bilateral primary osteoarthritis of knee: Secondary | ICD-10-CM

## 2020-02-03 DIAGNOSIS — M5134 Other intervertebral disc degeneration, thoracic region: Secondary | ICD-10-CM

## 2020-02-03 DIAGNOSIS — Z8709 Personal history of other diseases of the respiratory system: Secondary | ICD-10-CM

## 2020-02-03 DIAGNOSIS — G43019 Migraine without aura, intractable, without status migrainosus: Secondary | ICD-10-CM

## 2020-02-03 DIAGNOSIS — F17200 Nicotine dependence, unspecified, uncomplicated: Secondary | ICD-10-CM

## 2020-02-03 DIAGNOSIS — G894 Chronic pain syndrome: Secondary | ICD-10-CM

## 2020-02-03 NOTE — Telephone Encounter (Signed)
CMA is working on getting order for walker faxed over to [facility] per patient request. Please advise on order for home health/ PCS services. Maryjean Morn, CMA    Copied from CRM [id number]. Topic: General - Other >> Feb 02, 2020 12:32 PM Marylen Ponto wrote: Reason for CRM: Pt requests a call back from [facility] or [facility] regarding her request for home health as well as Rx for walker.

## 2020-02-04 ENCOUNTER — Telehealth (INDEPENDENT_AMBULATORY_CARE_PROVIDER_SITE_OTHER): Payer: Self-pay

## 2020-02-04 ENCOUNTER — Other Ambulatory Visit (INDEPENDENT_AMBULATORY_CARE_PROVIDER_SITE_OTHER): Payer: Self-pay | Admitting: Primary Care

## 2020-02-04 ENCOUNTER — Telehealth (INDEPENDENT_AMBULATORY_CARE_PROVIDER_SITE_OTHER): Payer: Self-pay | Admitting: Primary Care

## 2020-02-04 DIAGNOSIS — M2550 Pain in unspecified joint: Secondary | ICD-10-CM

## 2020-02-04 NOTE — Telephone Encounter (Signed)
Please schedule appoint to discuss treatment and appt with rheumatologist.

## 2020-02-04 NOTE — Telephone Encounter (Signed)
Patient would like a letter to return to work on light duty or restrictions. States Dr. Frederik Pear told her that she does not have lupus but rheumatoid arthritis. She needs to return to work so that she can pay her bills. Please provide letter. [name], CMA    Copied from CRM [id number]. Topic: General - Inquiry >> Feb 03, 2020  4:54 PM [name] wrote: Reason for CRM: Patient would like a call back by the doctor or her nurse as soon as possible. She stated that it was personal. She can be reached at [phone]. Please advise

## 2020-02-04 NOTE — Telephone Encounter (Signed)
Requested Prescriptions  Pending Prescriptions Disp Refills  . gabapentin (NEURONTIN) 300 MG capsule [Pharmacy Med Name: GABAPENTIN 300MG  CAPSULE] 90 capsule 1    Sig: TAKE 1 CAPSULE (300 MG TOTAL) BY MOUTH 3 (THREE) TIMES DAILY.     Neurology: Anticonvulsants - gabapentin Passed - 02/04/2020 10:22 AM      Passed - Valid encounter within last 12 months    Recent Outpatient Visits          1 month ago Type 2 diabetes mellitus without complication, with long-term current use of insulin (HCC)   [facility] 02/06/2020 P, NP   4 months ago Type 2 diabetes mellitus without complication, without long-term current use of insulin (HCC)   [facility], NP   7 months ago Type 2 diabetes mellitus without complication, without long-term current use of insulin (HCC)   [facility] P, NP   9 months ago Gastroesophageal reflux disease without esophagitis   [facility], NP   11 months ago Chronic pain of both feet   [facility], NP      Future Appointments            In 2 months Grayce Sessions, Randa Evens, NP [facility]

## 2020-02-04 NOTE — Telephone Encounter (Signed)
Pt would like a note to return to work, with restrictions : light duty. No pushing, pulling, bending. Her work advised if she has a Physicist, medical from her dr stating this, they will find he something to do. Effective until 03/30/20  Pt would like a call back to discuss some other things that Dr Corliss Skains told her.  (She never wants to go back to her.)

## 2020-02-13 ENCOUNTER — Other Ambulatory Visit: Payer: Self-pay

## 2020-02-13 ENCOUNTER — Encounter (INDEPENDENT_AMBULATORY_CARE_PROVIDER_SITE_OTHER): Payer: Self-pay | Admitting: Primary Care

## 2020-02-13 ENCOUNTER — Ambulatory Visit (INDEPENDENT_AMBULATORY_CARE_PROVIDER_SITE_OTHER): Payer: Medicaid Other | Admitting: Primary Care

## 2020-02-13 VITALS — BP 146/86 | HR 78 | Resp 20

## 2020-02-13 DIAGNOSIS — I10 Essential (primary) hypertension: Secondary | ICD-10-CM

## 2020-02-13 DIAGNOSIS — M2550 Pain in unspecified joint: Secondary | ICD-10-CM | POA: Diagnosis not present

## 2020-02-13 DIAGNOSIS — E119 Type 2 diabetes mellitus without complications: Secondary | ICD-10-CM | POA: Diagnosis not present

## 2020-02-16 ENCOUNTER — Other Ambulatory Visit (INDEPENDENT_AMBULATORY_CARE_PROVIDER_SITE_OTHER): Payer: Self-pay | Admitting: Primary Care

## 2020-02-16 DIAGNOSIS — E119 Type 2 diabetes mellitus without complications: Secondary | ICD-10-CM

## 2020-02-16 NOTE — Telephone Encounter (Signed)
Requested Prescriptions  Pending Prescriptions Disp Refills   LEVEMIR FLEXTOUCH 100 UNIT/ML FlexPen [Pharmacy Med Name: LEVEMIR FLEXTOUCH 100 UNIT/ML SUBCUTANEOUS SOLUTION PEN-INJECTOR] 30 mL 0    Sig: INJECT 50 UNITS INTO THE SKIN 2 (TWO) TIMES DAILY.     Endocrinology:  Diabetes - Insulins Failed - 02/16/2020 10:21 AM      Failed - HBA1C is between 0 and 7.9 and within 180 days    Hemoglobin A1C  Date Value Ref Range Status  01/05/2020 8.6 (A) 4.0 - 5.6 % Final   Hgb A1c MFr Bld  Date Value Ref Range Status  05/08/2017 7.4 (H) 4.8 - 5.6 % Final    Comment:             Prediabetes: 5.7 - 6.4          Diabetes: >6.4          Glycemic control for adults with diabetes: <7.0          Passed - Valid encounter within last 6 months    Recent Outpatient Visits          3 days ago Type 2 diabetes mellitus without complication, without long-term current use of insulin (HCC)   [facility] P, NP   1 month ago Type 2 diabetes mellitus without complication, with long-term current use of insulin (HCC)   [facility] P, NP   4 months ago Type 2 diabetes mellitus without complication, without long-term current use of insulin (HCC)   [facility] P, NP   7 months ago Type 2 diabetes mellitus without complication, without long-term current use of insulin (HCC)   [facility] P, NP   9 months ago Gastroesophageal reflux disease without esophagitis   [facility] Grayce Sessions, NP      Future Appointments            In 1 month Randa Evens, Kinnie Scales, NP [facility]

## 2020-02-25 ENCOUNTER — Telehealth (INDEPENDENT_AMBULATORY_CARE_PROVIDER_SITE_OTHER): Payer: Self-pay

## 2020-02-25 NOTE — Telephone Encounter (Signed)
Sent to PCP ?

## 2020-02-25 NOTE — Telephone Encounter (Signed)
Ms.Kolton called requesting a letter "Letter of Compassion" stating that her fiance Mikeal Hawthorne is her care giver and helps her financially. Patient states her daughters usually are the care givers and help financially but they are not living with her and are not able to help. Patient fiance Mikeal Hawthorne is currently detained and his attorney stated he would need the letter of Compassion by this afternoon not later then tomorrow morning by 8:30. Patient fiance has his court tim set for tomorrow Nov 18 @ 9:30 am.  Please advice [phone]

## 2020-02-27 NOTE — Telephone Encounter (Signed)
I AM NOT ABLE TO PROVIDE DOCUMENTATION

## 2020-03-01 NOTE — Telephone Encounter (Signed)
Patient is aware that PCP is unable to provide documentation.

## 2020-03-06 NOTE — Progress Notes (Signed)
Established Patient Office Visit  Subjective:  Patient ID: [name], female    DOB: 1970-10-16  Age: 49 y.o. MRN: [id number]  CC:  Chief Complaint  Patient presents with   Follow-up    HPI [name] is a 49 year old female who presents for management of type 2 diabetes, hypertension and  arthralgia positive ANA followed by rheumatology.  She denies polyuria polyaphasia and polydipsia. Denies shortness of breath, headaches, chest pain or lower extremity edema.  She does complain of pain all over especially in her lower extremities with unstable gait.  She was under the impression she had lupus. Past Medical History:  Diagnosis Date   Anxiety    Common migraine with intractable migraine 10/02/2016   Depression    Diabetes mellitus    Hypertension     Past Surgical History:  Procedure Laterality Date   CHOLECYSTECTOMY     TUBAL LIGATION      Family History  Problem Relation Age of Onset   Diabetes Mother    Hypertension Mother    Renal Disease Mother    Cancer Father    Diabetes Sister    Diabetes Brother    Renal Disease Brother    Diabetes Brother    Diabetes Sister    Diabetes Brother    Diabetes Brother    Diabetes Brother    Healthy Daughter    Healthy Daughter     Social History   Socioeconomic History   Marital status: Single    Spouse name: Not on file   Number of children: Not on file   Years of education: Not on file   Highest education level: Not on file  Occupational History   Not on file  Tobacco Use   Smoking status: Current Every Day Smoker    Packs/day: 0.25    Types: Cigarettes   Smokeless tobacco: Never Used  Vaping Use   Vaping Use: Former  Substance and Sexual Activity   Alcohol use: No   Drug use: No   Sexual activity: Yes    Birth control/protection: None  Other Topics Concern   Not on file  Social History Narrative   Right handed    Lives with daughter   Caffeine use: Drinks  coffee/tea/soda sometimes   Social Determinants of Radio broadcast assistant Strain:    Difficulty of Paying Living Expenses: Not on file  Food Insecurity:    Worried About Charity fundraiser in the Last Year: Not on file   [employer] of Food in the Last Year: Not on file  Transportation Needs:    Lack of Transportation (Medical): Not on file   Lack of Transportation (Non-Medical): Not on file  Physical Activity:    Days of Exercise per Week: Not on file   Minutes of Exercise per Session: Not on file  Stress:    Feeling of Stress : Not on file  Social Connections:    Frequency of Communication with Friends and Family: Not on file   Frequency of Social Gatherings with Friends and Family: Not on file   Attends Religious Services: Not on file   Active Member of Clubs or Organizations: Not on file   Attends Archivist Meetings: Not on file   Marital Status: Not on file  Intimate Partner Violence:    Fear of Current or Ex-Partner: Not on file   Emotionally Abused: Not on file   Physically Abused: Not on file   Sexually Abused: Not on file  Outpatient Medications Prior to Visit  Medication Sig Dispense Refill   acetaminophen (TYLENOL) 325 MG tablet Take 2 tablets (650 mg total) by mouth every 6 (six) hours as needed. 30 tablet 0   aspirin EC 81 MG tablet Take 81 mg by mouth every 6 (six) hours as needed for mild pain.      butalbital-acetaminophen-caffeine (FIORICET) 50-325-40 MG tablet Take 1 tablet by mouth every 6 (six) hours as needed for headache. 20 tablet 0   fluticasone (FLOVENT HFA) 44 MCG/ACT inhaler Inhale 2 puffs into the lungs 2 (two) times daily. 1 Inhaler 12   gabapentin (NEURONTIN) 300 MG capsule TAKE 1 CAPSULE (300 MG TOTAL) BY MOUTH 3 (THREE) TIMES DAILY. 90 capsule 1   glimepiride (AMARYL) 4 MG tablet Take 1 tablet (4 mg total) by mouth daily with breakfast. 90 tablet 1   glucose blood (ACCU-CHEK AVIVA PLUS) test strip Use as  instructed 100 each 12   ibuprofen (ADVIL) 600 MG tablet Take 1 tablet (600 mg total) by mouth every 8 (eight) hours as needed for moderate pain. 90 tablet 0   Lancets (ACCU-CHEK SOFT TOUCH) lancets Use as instructed 100 each 12   losartan (COZAAR) 25 MG tablet Take 1 tablet (25 mg total) by mouth daily. 90 tablet 1   metFORMIN (GLUCOPHAGE) 1000 MG tablet TAKE 1 TABLET BY MOUTH 2 TIMES DAILY WITH A MEAL. 180 tablet 0   NOVOLOG FLEXPEN 100 UNIT/ML FlexPen For blood sugars 0-150 give 0 units of insulin, 200-250 give 2 units of insulin, 251-300 give 4 units, 300-351 give 6 units, 351-400 give 8 units, 401-450 give 10 units,> 451 give 12 units and call M.D. Discussed hypoglycemia protocol. 15 mL 3   pravastatin (PRAVACHOL) 10 MG tablet Take 1 tablet (10 mg total) by mouth daily. 90 tablet 1   PROVENTIL HFA 108 (90 Base) MCG/ACT inhaler INHALE 1 TO 2 PUFFS BY MOUTH EVERY 6 (SIX) HOURS AS NEEDED FOR WHEEZING OR SHORTNESS OF BREATH. (Patient taking differently: Inhale 1-2 puffs into the lungs every 6 (six) hours as needed for wheezing or shortness of breath. ) 6.7 g 1   insulin detemir (LEVEMIR FLEXTOUCH) 100 UNIT/ML FlexPen INJECT 50 UNITS INTO THE SKIN 2 (TWO) TIMES DAILY. 30 mL 3   Accu-Chek Softclix Lancets lancets Use as instructed 100 each 12   blood glucose meter kit and supplies Dispense based on patient and insurance preference. Use up to four times daily as directed. (FOR ICD-10 E10.9, E11.9). 1 each 0   EASY COMFORT PEN NEEDLES 31G X 5 MM MISC USE AS INSTRUCTED. TWICE DAILY. 100 each 1   No facility-administered medications prior to visit.    Allergies  Allergen Reactions   Lisinopril     angioedema    ROS Review of Systems  Musculoskeletal: Positive for arthralgias and gait problem.  All other systems reviewed and are negative.     Objective:    Physical Exam Vitals reviewed.  Constitutional:      Appearance: She is obese.  HENT:     Head: Normocephalic.      Nose: Nose normal.  Cardiovascular:     Rate and Rhythm: Normal rate and regular rhythm.  Pulmonary:     Effort: Pulmonary effort is normal.     Breath sounds: Normal breath sounds.  Abdominal:     General: Bowel sounds are normal.     Palpations: Abdomen is soft.  Musculoskeletal:     Cervical back: Normal range of motion and neck supple.  Skin:    General: Skin is warm and dry.  Neurological:     Mental Status: She is alert and oriented to person, place, and time.     Motor: Weakness present.     Gait: Gait abnormal.  Psychiatric:     Comments: Depressed states in pain all the time.      BP (!) 146/86    Pulse 78    Resp 20    SpO2 100%  Wt Readings from Last 3 Encounters:  02/03/20 250 lb 3.2 oz (113.5 kg)  01/05/20 251 lb 9.6 oz (114.1 kg)  01/05/20 251 lb (113.9 kg)     Health Maintenance Due  Topic Date Due   PNEUMOCOCCAL POLYSACCHARIDE VACCINE AGE 62-64 HIGH RISK  Never done   FOOT EXAM  Never done   COLONOSCOPY  Never done   OPHTHALMOLOGY EXAM  10/20/2018   INFLUENZA VACCINE  Never done   COVID-19 Vaccine (2 - Moderna 2-dose series) 12/23/2019    There are no preventive care reminders to display for this patient.  Lab Results  Component Value Date   TSH 2.870 10/02/2018   Lab Results  Component Value Date   WBC 13.7 (H) 12/07/2019   HGB 13.5 12/07/2019   HCT 40.4 12/07/2019   MCV 75.0 (L) 12/07/2019   PLT 262 12/07/2019   Lab Results  Component Value Date   NA 143 12/07/2019   K 3.4 (L) 12/07/2019   CO2 25 12/07/2019   GLUCOSE 127 (H) 12/07/2019   BUN 10 12/07/2019   CREATININE 0.72 12/07/2019   BILITOT 0.4 12/07/2019   ALKPHOS 133 (H) 12/07/2019   AST 26 12/07/2019   ALT 47 (H) 12/07/2019   PROT 7.4 12/07/2019   ALBUMIN 3.8 12/07/2019   CALCIUM 9.0 12/07/2019   ANIONGAP 10 12/07/2019   Lab Results  Component Value Date   CHOL 185 08/01/2018   Lab Results  Component Value Date   HDL 40 08/01/2018   Lab Results  Component  Value Date   LDLCALC 125 (H) 08/01/2018   Lab Results  Component Value Date   TRIG 102 08/01/2018   Lab Results  Component Value Date   CHOLHDL 4.6 (H) 08/01/2018   Lab Results  Component Value Date   HGBA1C 8.6 (A) 01/05/2020      Assessment & Plan:  [name] was seen today for follow-up.  Diagnoses and all orders for this visit:  Type 2 diabetes mellitus without complication, without long-term current use of insulin ([place]) : Goal of therapy: Less than 6.5 hemoglobin A1c.  Improvement Down to 8.6 previously 9.8. Continue to monitor foods that are high in carbohydrates are the following rice, potatoes, breads, sugars, and pastas.  Reduction in the intake (eating) will assist in lowering your blood sugars. -     Cancel: Glucose (CBG)  Essential hypertension Blood pressure goal of less than 130/80, low-sodium, DASH diet, medication compliance, . Difficulty excercising Discussed medication compliance.  Arthralgia, unspecified joint Work on losing weight to help reduce joint pain. May alternate with heat and ice application for pain relief. May also alternate with acetaminophen and Ibuprofen  Other alternatives include massage, acupuncture and water aerobics.  You must stay active and avoid a sedentary lifestyle.   No orders of the defined types were placed in this encounter.   Follow-up: No follow-ups on file.    Kerin Perna, NP

## 2020-03-08 ENCOUNTER — Other Ambulatory Visit (INDEPENDENT_AMBULATORY_CARE_PROVIDER_SITE_OTHER): Payer: Self-pay | Admitting: Primary Care

## 2020-03-08 DIAGNOSIS — E119 Type 2 diabetes mellitus without complications: Secondary | ICD-10-CM

## 2020-03-08 NOTE — Telephone Encounter (Signed)
Future visit tomorrow 

## 2020-03-09 ENCOUNTER — Other Ambulatory Visit: Payer: Self-pay

## 2020-03-09 ENCOUNTER — Encounter (INDEPENDENT_AMBULATORY_CARE_PROVIDER_SITE_OTHER): Payer: Self-pay | Admitting: Primary Care

## 2020-03-09 ENCOUNTER — Ambulatory Visit (INDEPENDENT_AMBULATORY_CARE_PROVIDER_SITE_OTHER): Payer: Medicaid Other | Admitting: Primary Care

## 2020-03-09 VITALS — BP 153/88 | HR 94 | Temp 97.3°F | Ht 65.5 in | Wt 256.2 lb

## 2020-03-09 DIAGNOSIS — M797 Fibromyalgia: Secondary | ICD-10-CM | POA: Diagnosis not present

## 2020-03-09 DIAGNOSIS — F32A Depression, unspecified: Secondary | ICD-10-CM

## 2020-03-09 DIAGNOSIS — G8929 Other chronic pain: Secondary | ICD-10-CM

## 2020-03-09 DIAGNOSIS — D72829 Elevated white blood cell count, unspecified: Secondary | ICD-10-CM

## 2020-03-09 DIAGNOSIS — M25561 Pain in right knee: Secondary | ICD-10-CM | POA: Diagnosis not present

## 2020-03-09 DIAGNOSIS — M25562 Pain in left knee: Secondary | ICD-10-CM

## 2020-03-09 MED ORDER — IBUPROFEN 600 MG PO TABS: 600.0000 mg | ORAL_TABLET | Freq: Three times a day (TID) | ORAL | 0 refills | Status: DC | PRN

## 2020-03-09 MED ORDER — METHOCARBAMOL 500 MG PO TABS
500.0000 mg | ORAL_TABLET | Freq: Three times a day (TID) | ORAL | 1 refills | Status: DC | PRN
Start: 2020-03-09 — End: 2021-01-07

## 2020-03-09 MED ORDER — DULOXETINE HCL 20 MG PO CPEP: 20.0000 mg | ORAL_CAPSULE | Freq: Two times a day (BID) | ORAL | 1 refills | Status: DC

## 2020-03-09 NOTE — Progress Notes (Signed)
Established Patient Office Visit  Subjective:  Patient ID: [name], female    DOB: 1970-09-06  Age: 49 y.o. MRN: [id number]  CC:  Chief Complaint  Patient presents with  . health concerns    HPI [name] is a 49 year old female who presents for chronic pain diagnosed with fibromyalgia referred to rheumatology for elevated sed rate.  Rheumatologist Dr. Corena Pilgrim did a full arthritic panel rule out lupus made suggestions for PCP for treatment.  She has chronic pain from waist down unstable gait appears to be in acute distress today also crying regarding sending her back to work and she is not able to function at work.  Also followed by orthopedics Dr. 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 Past Medical History:  Diagnosis Date  . Anxiety   . Common migraine with intractable migraine 10/02/2016  . Depression   . Diabetes mellitus   . Hypertension     Past Surgical History:  Procedure Laterality Date  . CHOLECYSTECTOMY    . TUBAL LIGATION      Family History  Problem Relation Age of Onset  . Diabetes Mother   . Hypertension Mother   . Renal Disease Mother   . Cancer Father   . Diabetes Sister   . Diabetes Brother   . Renal Disease Brother   . Diabetes Brother   . Diabetes Sister   . Diabetes Brother   . Diabetes Brother   . Diabetes Brother   . Healthy Daughter   . Healthy Daughter     Social History   Socioeconomic History  . Marital status: Single    Spouse name: Not on file  . Number of children: Not on file  . Years of education: Not on file  . Highest education level: Not on file  Occupational History  . Not on file  Tobacco Use  . Smoking status: Current Every Day Smoker    Packs/day: 0.25    Types: Cigarettes  . Smokeless tobacco: Never Used  Vaping Use  . Vaping Use: Former  Substance and Sexual Activity  . Alcohol use: No  . Drug use: No  . Sexual activity: Yes    Birth control/protection: None  Other Topics Concern  . Not  on file  Social History Narrative   Right handed    Lives with daughter   Caffeine use: Drinks coffee/tea/soda sometimes   Social Determinants of Health   Financial Resource Strain:   . Difficulty of Paying Living Expenses: Not on file  Food Insecurity:   . Worried About Charity fundraiser in the Last Year: Not on file  . Ran Out of Food in the Last Year: Not on file  Transportation Needs:   . Lack of Transportation (Medical): Not on file  . Lack of Transportation (Non-Medical): Not on file  Physical Activity:   . Days of Exercise per Week: Not on file  . Minutes of Exercise per Session: Not on file  Stress:   . Feeling of Stress : Not on file  Social Connections:   . Frequency of Communication with Friends and Family: Not on file  . Frequency of Social Gatherings with Friends and Family: Not on file  . Attends Religious Services: Not on file  . Active Member of Clubs or Organizations: Not on file  . Attends Archivist Meetings: Not on file  . Marital Status: Not on file  Intimate Partner Violence:   . Fear of Current or Ex-Partner: Not on file  .  Emotionally Abused: Not on file  . Physically Abused: Not on file  . Sexually Abused: Not on file    Outpatient Medications Prior to Visit  Medication Sig Dispense Refill  . Accu-Chek Softclix Lancets lancets Use as instructed 100 each 12  . acetaminophen (TYLENOL) 325 MG tablet Take 2 tablets (650 mg total) by mouth every 6 (six) hours as needed. 30 tablet 0  . aspirin EC 81 MG tablet Take 81 mg by mouth every 6 (six) hours as needed for mild pain.     . blood glucose meter kit and supplies Dispense based on patient and insurance preference. Use up to four times daily as directed. (FOR ICD-10 E10.9, E11.9). 1 each 0  . butalbital-acetaminophen-caffeine (FIORICET) 50-325-40 MG tablet Take 1 tablet by mouth every 6 (six) hours as needed for headache. 20 tablet 0  . EASY COMFORT PEN NEEDLES 31G X 5 MM MISC USE AS  INSTRUCTED. TWICE DAILY. 100 each 1  . fluticasone (FLOVENT HFA) 44 MCG/ACT inhaler Inhale 2 puffs into the lungs 2 (two) times daily. 1 Inhaler 12  . gabapentin (NEURONTIN) 300 MG capsule TAKE 1 CAPSULE (300 MG TOTAL) BY MOUTH 3 (THREE) TIMES DAILY. 90 capsule 1  . glimepiride (AMARYL) 4 MG tablet Take 1 tablet (4 mg total) by mouth daily with breakfast. 90 tablet 1  . glucose blood (ACCU-CHEK AVIVA PLUS) test strip Use as instructed 100 each 12  . ibuprofen (ADVIL) 600 MG tablet Take 1 tablet (600 mg total) by mouth every 8 (eight) hours as needed for moderate pain. 90 tablet 0  . Lancets (ACCU-CHEK SOFT TOUCH) lancets Use as instructed 100 each 12  . LEVEMIR FLEXTOUCH 100 UNIT/ML FlexPen INJECT 50 UNITS INTO THE SKIN 2 (TWO) TIMES DAILY. 30 mL 0  . losartan (COZAAR) 25 MG tablet Take 1 tablet (25 mg total) by mouth daily. 90 tablet 1  . metFORMIN (GLUCOPHAGE) 1000 MG tablet TAKE 1 TABLET BY MOUTH 2 TIMES DAILY WITH A MEAL. 180 tablet 0  . NOVOLOG FLEXPEN 100 UNIT/ML FlexPen For blood sugars 0-150 give 0 units of insulin, 200-250 give 2 units of insulin, 251-300 give 4 units, 300-351 give 6 units, 351-400 give 8 units, 401-450 give 10 units,> 451 give 12 units and call M.D. Discussed hypoglycemia protocol. 15 mL 3  . pravastatin (PRAVACHOL) 10 MG tablet Take 1 tablet (10 mg total) by mouth daily. 90 tablet 1  . PROVENTIL HFA 108 (90 Base) MCG/ACT inhaler INHALE 1 TO 2 PUFFS BY MOUTH EVERY 6 (SIX) HOURS AS NEEDED FOR WHEEZING OR SHORTNESS OF BREATH. (Patient taking differently: Inhale 1-2 puffs into the lungs every 6 (six) hours as needed for wheezing or shortness of breath. ) 6.7 g 1   No facility-administered medications prior to visit.    Allergies  Allergen Reactions  . Lisinopril     angioedema    ROS Review of Systems  Musculoskeletal: Positive for arthralgias, back pain, gait problem, joint swelling, myalgias, neck pain and neck stiffness.  Psychiatric/Behavioral: Positive for  agitation. The patient is nervous/anxious.        Depression  All other systems reviewed and are negative.     Objective:    Physical Exam Constitutional:      Appearance: She is obese.  HENT:     Head: Normocephalic.     Nose: Nose normal.  Cardiovascular:     Rate and Rhythm: Normal rate and regular rhythm.  Pulmonary:     Effort: Pulmonary effort is normal.  Breath sounds: Normal breath sounds.  Abdominal:     General: Bowel sounds are normal. There is distension.     Palpations: Abdomen is soft.  Musculoskeletal:     Cervical back: Rigidity present.     Comments: decrease ROM unable to lift arms greater than 45 degrees  Neurological:     Mental Status: She is alert.     BP (!) 153/88 (BP Location: Left Arm, Patient Position: Sitting, Cuff Size: Large)   Pulse 94   Temp (!) 97.3 F (36.3 C) (Temporal)   Ht 5' 5.5" (1.664 m)   Wt 256 lb 3.2 oz (116.2 kg)   SpO2 96%   BMI 41.99 kg/m  Wt Readings from Last 3 Encounters:  03/09/20 256 lb 3.2 oz (116.2 kg)  02/03/20 250 lb 3.2 oz (113.5 kg)  01/05/20 251 lb 9.6 oz (114.1 kg)     Health Maintenance Due  Topic Date Due  . PNEUMOCOCCAL POLYSACCHARIDE VACCINE AGE 66-64 HIGH RISK  Never done  . FOOT EXAM  Never done  . COLONOSCOPY  Never done  . OPHTHALMOLOGY EXAM  10/20/2018  . COVID-19 Vaccine (2 - Moderna 2-dose series) 12/23/2019    There are no preventive care reminders to display for this patient.  Lab Results  Component Value Date   TSH 2.870 10/02/2018   Lab Results  Component Value Date   WBC 13.7 (H) 12/07/2019   HGB 13.5 12/07/2019   HCT 40.4 12/07/2019   MCV 75.0 (L) 12/07/2019   PLT 262 12/07/2019   Lab Results  Component Value Date   NA 143 12/07/2019   K 3.4 (L) 12/07/2019   CO2 25 12/07/2019   GLUCOSE 127 (H) 12/07/2019   BUN 10 12/07/2019   CREATININE 0.72 12/07/2019   BILITOT 0.4 12/07/2019   ALKPHOS 133 (H) 12/07/2019   AST 26 12/07/2019   ALT 47 (H) 12/07/2019   PROT 7.4  12/07/2019   ALBUMIN 3.8 12/07/2019   CALCIUM 9.0 12/07/2019   ANIONGAP 10 12/07/2019   Lab Results  Component Value Date   CHOL 185 08/01/2018   Lab Results  Component Value Date   HDL 40 08/01/2018   Lab Results  Component Value Date   LDLCALC 125 (H) 08/01/2018   Lab Results  Component Value Date   TRIG 102 08/01/2018   Lab Results  Component Value Date   CHOLHDL 4.6 (H) 08/01/2018   Lab Results  Component Value Date   HGBA1C 8.6 (A) 01/05/2020     Fibromyalgia    Assessment & Plan:   Problem List Items Addressed This Visit    None    Visit Diagnoses    Leukocytosis, unspecified type    -  Primary   Relevant Orders   Ambulatory referral to Hematology   Depression, unspecified depression type       Relevant Medications   DULoxetine (CYMBALTA) 20 MG capsule   Other Relevant Orders   Ambulatory referral to Psychiatry   Bilateral chronic knee pain       Relevant Medications   DULoxetine (CYMBALTA) 20 MG capsule   methocarbamol (ROBAXIN) 500 MG tablet   Other Relevant Orders   Ambulatory referral to Orthopedic Surgery      Meds ordered this encounter  Medications  . DULoxetine (CYMBALTA) 20 MG capsule    Sig: Take 1 capsule (20 mg total) by mouth 2 (two) times daily.    Dispense:  180 capsule    Refill:  1  . methocarbamol (ROBAXIN) 500  MG tablet    Sig: Take 1 tablet (500 mg total) by mouth every 8 (eight) hours as needed for muscle spasms.    Dispense:  90 tablet    Refill:  1    Follow-up: No follow-ups on file.    Kerin Perna, NP

## 2020-03-09 NOTE — Patient Instructions (Signed)
Myofascial Pain Syndrome and Fibromyalgia Myofascial pain syndrome and fibromyalgia are both pain disorders. This pain may be felt mainly in your muscles.  Myofascial pain syndrome: ? Always has tender points in the muscle that will cause pain when pressed (trigger points). The pain may come and go. ? Usually affects your neck, upper back, and shoulder areas. The pain often radiates into your arms and hands.  Fibromyalgia: ? Has muscle pains and tenderness that come and go. ? Is often associated with fatigue and sleep problems. ? Has trigger points. ? Tends to be long-lasting (chronic), but is not life-threatening. Fibromyalgia and myofascial pain syndrome are not the same. However, they often occur together. If you have both conditions, each can make the other worse. Both are common and can cause enough pain and fatigue to make day-to-day activities difficult. Both can be hard to diagnose because their symptoms are common in many other conditions. What are the causes? The exact causes of these conditions are not known. What increases the risk? You are more likely to develop this condition if:  You have a family history of the condition.  You have certain triggers, such as: ? Spine disorders. ? An injury (trauma) or other physical stressors. ? Being under a lot of stress. ? Medical conditions such as osteoarthritis, rheumatoid arthritis, or lupus. What are the signs or symptoms? Fibromyalgia The main symptom of fibromyalgia is widespread pain and tenderness in your muscles. Pain is sometimes described as stabbing, shooting, or burning. You may also have:  Tingling or numbness.  Sleep problems and fatigue.  Problems with attention and concentration (fibro fog). Other symptoms may include:  Bowel and bladder problems.  Headaches.  Visual problems.  Problems with odors and noises.  Depression or mood changes.  Painful menstrual periods (dysmenorrhea).  Dry skin or  eyes. These symptoms can vary over time. Myofascial pain syndrome Symptoms of myofascial pain syndrome include:  Tight, ropy bands of muscle.  Uncomfortable sensations in muscle areas. These may include aching, cramping, burning, numbness, tingling, and weakness.  Difficulty moving certain parts of the body freely (poor range of motion). How is this diagnosed? This condition may be diagnosed by your symptoms and medical history. You will also have a physical exam. In general:  Fibromyalgia is diagnosed if you have pain, fatigue, and other symptoms for more than 3 months, and symptoms cannot be explained by another condition.  Myofascial pain syndrome is diagnosed if you have trigger points in your muscles, and those trigger points are tender and cause pain elsewhere in your body (referred pain). How is this treated? Treatment for these conditions depends on the type that you have.  For fibromyalgia: ? Pain medicines, such as NSAIDs. ? Medicines for treating depression. ? Medicines for treating seizures. ? Medicines that relax the muscles.  For myofascial pain: ? Pain medicines, such as NSAIDs. ? Cooling and stretching of muscles. ? Trigger point injections. ? Sound wave (ultrasound) treatments to stimulate muscles. Treating these conditions often requires a team of health care providers. These may include:  Your primary care provider.  Physical therapist.  Complementary health care providers, such as massage therapists or acupuncturists.  Psychiatrist for cognitive behavioral therapy. Follow these instructions at home: Medicines  Take over-the-counter and prescription medicines only as told by your health care provider.  Do not drive or use heavy machinery while taking prescription pain medicine.  If you are taking prescription pain medicine, take actions to prevent or treat constipation. Your health care   provider may recommend that you: ? Drink enough fluid to keep  your urine pale yellow. ? Eat foods that are high in fiber, such as fresh fruits and vegetables, whole grains, and beans. ? Limit foods that are high in fat and processed sugars, such as fried or sweet foods. ? Take an over-the-counter or prescription medicine for constipation. Lifestyle   Exercise as directed by your health care provider or physical therapist.  Practice relaxation techniques to control your stress. You may want to try: ? Biofeedback. ? Visual imagery. ? Hypnosis. ? Muscle relaxation. ? Yoga. ? Meditation.  Maintain a healthy lifestyle. This includes eating a healthy diet and getting enough sleep.  Do not use any products that contain nicotine or tobacco, such as cigarettes and e-cigarettes. If you need help quitting, ask your health care provider. General instructions  Talk to your health care provider about complementary treatments, such as acupuncture or massage.  Consider joining a support group with others who are diagnosed with this condition.  Do not do activities that stress or strain your muscles. This includes repetitive motions and heavy lifting.  Keep all follow-up visits as told by your health care provider. This is important. Where to find more information  National Fibromyalgia Association: www.fmaware.org  Arthritis Foundation: www.arthritis.org  American Chronic Pain Association: www.theacpa.org Contact a health care provider if:  You have new symptoms.  Your symptoms get worse or your pain is severe.  You have side effects from your medicines.  You have trouble sleeping.  Your condition is causing depression or anxiety. Summary  Myofascial pain syndrome and fibromyalgia are pain disorders.  Myofascial pain syndrome has tender points in the muscle that will cause pain when pressed (trigger points). Fibromyalgia also has muscle pains and tenderness that come and go, but this condition is often associated with fatigue and sleep  disturbances.  Fibromyalgia and myofascial pain syndrome are not the same but often occur together, causing pain and fatigue that make day-to-day activities difficult.  Treatment for fibromyalgia includes taking medicines to relax the muscles and medicines for pain, depression, or seizures. Treatment for myofascial pain syndrome includes taking medicines for pain, cooling and stretching of muscles, and injecting medicines into trigger points.  Follow your health care provider's instructions for taking medicines and maintaining a healthy lifestyle. This information is not intended to replace advice given to you by your health care provider. Make sure you discuss any questions you have with your health care provider. Document Revised: 07/19/2018 Document Reviewed: 04/11/2017 Elsevier Patient Education  2020 Elsevier Inc.  

## 2020-03-18 ENCOUNTER — Ambulatory Visit: Payer: Medicaid Other | Admitting: Orthopedic Surgery

## 2020-03-18 NOTE — Progress Notes (Deleted)
[facility] [phone]   [phone]  CONSULT NOTE  REFERRING PHYSICIAN: Juluis Mire  REASON FOR CONSULTATION:  Leukocytosis  HPI Calianna Kim is a 49 y.o. female with a past medical history significant for chronic pain syndrome, fibromyalgia, migraines, obesity, and diabetes mellitus is referred to the clinic today for evaluation of leukocytosis.  The patient recently had an appointment with his PCP, on 03/09/2020 to rediscuss her chronic pain syndrome. She had recently been evaluated by rheumatology and had a full _ panel performed. She also sees Dr. Marlou Sa from orthopedics for bilateral arthritic pain.  Upon reviewing records, the patient's PCP noted persistent leukocytosis for the last several years. The most recent CBC is from 12/07/2019 which shows a WBC of 13.7, the patient's platelets and hemoglobin were within normal limits, the patient's neutrophils were elevated at 8.4, the patient's lymphocytes were slightly elevated at 4.3. Her MCV is low at _. Tthe patient was referred to the clinic today for further evaluation and work-up into his condition.   Iron last checked in 2019.   Per chart review, it appears that the patient's leukocytosis can be seen dated back to 2008 which is the earliest records available to me.  Her WBCs have ranged between 9.6 K and ~16k. Primarily _.  Chronic pain? Pain management facility? The patient denies any recent fever, chills, night sweats, or weight loss. The patient denies any lymphadenopathy.  The patient denies any nausea, vomiting, diarrhea, or constipation. She denies abdominal fullness or early satiety.  Patient denies any recent viral infections.  The patient denies any recent sore throat, cough, skin infections, or dysuria. The patient denies steroid use except for _inhalers? How often use inhalers? The patient currently smokes _ packs of cigarettes per _ and has been smoking for _ years.  The patient  denies any new medications.  The patient denies any history of any autoimmune disorders and recently had been evaluated by Dr. Corena Pilgrim from rheumatology.  The patient denies any abnormal bleeding or bruising. HPI  Past Medical History:  Diagnosis Date  . Anxiety   . Common migraine with intractable migraine 10/02/2016  . Depression   . Diabetes mellitus   . Hypertension     Past Surgical History:  Procedure Laterality Date  . CHOLECYSTECTOMY    . TUBAL LIGATION      Family History  Problem Relation Age of Onset  . Diabetes Mother   . Hypertension Mother   . Renal Disease Mother   . Cancer Father   . Diabetes Sister   . Diabetes Brother   . Renal Disease Brother   . Diabetes Brother   . Diabetes Sister   . Diabetes Brother   . Diabetes Brother   . Diabetes Brother   . Healthy Daughter   . Healthy Daughter     Social History Social History   Tobacco Use  . Smoking status: Current Every Day Smoker    Packs/day: 0.25    Types: Cigarettes  . Smokeless tobacco: Never Used  Vaping Use  . Vaping Use: Former  Substance Use Topics  . Alcohol use: No  . Drug use: No    Allergies  Allergen Reactions  . Lisinopril     angioedema    Current Outpatient Medications  Medication Sig Dispense Refill  . Accu-Chek Softclix Lancets lancets Use as instructed 100 each 12  . acetaminophen (TYLENOL) 325 MG tablet Take 2 tablets (650 mg total) by mouth every 6 (six) hours  as needed. 30 tablet 0  . aspirin EC 81 MG tablet Take 81 mg by mouth every 6 (six) hours as needed for mild pain.     . blood glucose meter kit and supplies Dispense based on patient and insurance preference. Use up to four times daily as directed. (FOR ICD-10 E10.9, E11.9). 1 each 0  . butalbital-acetaminophen-caffeine (FIORICET) 50-325-40 MG tablet Take 1 tablet by mouth every 6 (six) hours as needed for headache. 20 tablet 0  . DULoxetine (CYMBALTA) 20 MG capsule Take 1 capsule (20 mg total) by mouth 2 (two)  times daily. 180 capsule 1  . EASY COMFORT PEN NEEDLES 31G X 5 MM MISC USE AS INSTRUCTED. TWICE DAILY. 100 each 1  . fluticasone (FLOVENT HFA) 44 MCG/ACT inhaler Inhale 2 puffs into the lungs 2 (two) times daily. 1 Inhaler 12  . gabapentin (NEURONTIN) 300 MG capsule TAKE 1 CAPSULE (300 MG TOTAL) BY MOUTH 3 (THREE) TIMES DAILY. 90 capsule 1  . glimepiride (AMARYL) 4 MG tablet Take 1 tablet (4 mg total) by mouth daily with breakfast. 90 tablet 1  . glucose blood (ACCU-CHEK AVIVA PLUS) test strip Use as instructed 100 each 12  . ibuprofen (ADVIL) 600 MG tablet Take 1 tablet (600 mg total) by mouth every 8 (eight) hours as needed for moderate pain. 90 tablet 0  . Lancets (ACCU-CHEK SOFT TOUCH) lancets Use as instructed 100 each 12  . LEVEMIR FLEXTOUCH 100 UNIT/ML FlexPen INJECT 50 UNITS INTO THE SKIN 2 (TWO) TIMES DAILY. 30 mL 0  . losartan (COZAAR) 25 MG tablet Take 1 tablet (25 mg total) by mouth daily. 90 tablet 1  . metFORMIN (GLUCOPHAGE) 1000 MG tablet TAKE 1 TABLET BY MOUTH 2 TIMES DAILY WITH A MEAL. 180 tablet 0  . methocarbamol (ROBAXIN) 500 MG tablet Take 1 tablet (500 mg total) by mouth every 8 (eight) hours as needed for muscle spasms. 90 tablet 1  . NOVOLOG FLEXPEN 100 UNIT/ML FlexPen For blood sugars 0-150 give 0 units of insulin, 200-250 give 2 units of insulin, 251-300 give 4 units, 300-351 give 6 units, 351-400 give 8 units, 401-450 give 10 units,> 451 give 12 units and call M.D. Discussed hypoglycemia protocol. 15 mL 3  . pravastatin (PRAVACHOL) 10 MG tablet Take 1 tablet (10 mg total) by mouth daily. 90 tablet 1  . PROVENTIL HFA 108 (90 Base) MCG/ACT inhaler INHALE 1 TO 2 PUFFS BY MOUTH EVERY 6 (SIX) HOURS AS NEEDED FOR WHEEZING OR SHORTNESS OF BREATH. (Patient taking differently: Inhale 1-2 puffs into the lungs every 6 (six) hours as needed for wheezing or shortness of breath. ) 6.7 g 1   No current facility-administered medications for this visit.    Review of Systems  {Ros -  complete:30496}  Physical Exam  RAL:{CHL ONC PE [phone]} SKIN: {CHL ONC PE AYTK:[id number]} HEAD: {CHL ONC PE TDDU:2025427062} EYES: {CHL ONC PE BJSE:8315176160} EARS: {CHL ONC PE VPXT:0626948546} OROPHARYNX:{CHL ONC PE [phone]}  NECK: {CHL ONC PE EVOJ:5009381829} LYMPH:  {CHL ONC PE HBZJI:9678938101} BREAST:{CHL ONC PE BREAST:[phone]} LUNGS: {CHL ONC PE BPZWC:5852778242} HEART: {CHL ONC PE PNTIR:4431540086} ABDOMEN:{CHL ONC PE ABDOMEN:2123512931} BACK: {CHL ONC PE PYPP:5093267124} EXTREMITIES:{CHL ONC PE EXTREMITIES:[phone]}  NEURO: {CHL ONC PE NEURO:[phone]}  PERFORMANCE STATUS: ECOG ***  LABORATORY DATA: Lab Results  Component Value Date   WBC 13.7 (H) 12/07/2019   HGB 13.5 12/07/2019   HCT 40.4 12/07/2019   MCV 75.0 (L) 12/07/2019   PLT 262 12/07/2019      Chemistry  Component Value Date/Time   NA 143 12/07/2019 2311   NA 143 08/01/2018 1100   K 3.4 (L) 12/07/2019 2311   CL 108 12/07/2019 2311   CO2 25 12/07/2019 2311   BUN 10 12/07/2019 2311   BUN 13 08/01/2018 1100   CREATININE 0.72 12/07/2019 2311      Component Value Date/Time   CALCIUM 9.0 12/07/2019 2311   ALKPHOS 133 (H) 12/07/2019 2311   AST 26 12/07/2019 2311   ALT 47 (H) 12/07/2019 2311   BILITOT 0.4 12/07/2019 2311   BILITOT 0.4 08/01/2018 1100       RADIOGRAPHIC STUDIES: No results found.  ASSESSMENT: This is a very pleasant 49 year old African-American female referred to the clinic for evaluation of leukocytosis.   PLAN: The patient was seen with Dr. Burr Medico today.  The patient had a  The patient voices understanding of current disease status and treatment options and is in agreement with the current care plan.  All questions were answered. The patient knows to call the clinic with any problems, questions or concerns. We can certainly see the patient much sooner if necessary.  Thank you so much for allowing me to participate in the care of [facility] I will continue to follow up the patient with you and assist in her care.  I spent {CHL ONC TIME VISIT - BLTGA:[id number]} counseling the patient face to face. The total time spent in the appointment was {CHL ONC TIME VISIT - EQJEA:3073543014}.  Disclaimer: This note was dictated with voice recognition software. Similar sounding words can inadvertently be transcribed and may not be corrected upon review.   Decie Verne L Dilon Lank March 18, 2020, 7:04 PM

## 2020-03-19 ENCOUNTER — Inpatient Hospital Stay: Payer: Medicaid Other | Attending: Physician Assistant | Admitting: Physician Assistant

## 2020-03-19 ENCOUNTER — Inpatient Hospital Stay: Payer: Medicaid Other

## 2020-03-19 DIAGNOSIS — F17210 Nicotine dependence, cigarettes, uncomplicated: Secondary | ICD-10-CM | POA: Insufficient documentation

## 2020-03-19 DIAGNOSIS — R718 Other abnormality of red blood cells: Secondary | ICD-10-CM | POA: Insufficient documentation

## 2020-03-19 DIAGNOSIS — Z809 Family history of malignant neoplasm, unspecified: Secondary | ICD-10-CM | POA: Insufficient documentation

## 2020-03-19 DIAGNOSIS — D72829 Elevated white blood cell count, unspecified: Secondary | ICD-10-CM | POA: Insufficient documentation

## 2020-03-22 ENCOUNTER — Telehealth: Payer: Self-pay | Admitting: Physician Assistant

## 2020-03-22 NOTE — Telephone Encounter (Signed)
Pt cld to reschedule her new hem appt w/Cassie on 12/14 at 130pm w/labs at 1pm. Pt aware to arrive 30 minutes early.

## 2020-03-22 NOTE — Progress Notes (Deleted)
[facility] [phone]) [phone]   [phone]  CONSULT NOTE  REFERRING PHYSICIAN: Juluis Mire  REASON FOR CONSULTATION:  Leukocytosis  HPI [name] is a 49 y.o. female with a past medical history significant for chronic pain syndrome, fibromyalgia, migraines, obesity, and diabetes mellitus is referred to the clinic today for evaluation of leukocytosis.  The patient recently had an appointment with his PCP, on 03/09/2020 to rediscuss her chronic pain syndrome. She had recently been evaluated by rheumatology and had a full _ panel performed. She also sees Dr. Marlou Sa from orthopedics for bilateral arthritic pain.  Upon reviewing records, the patient's PCP noted persistent leukocytosis for the last several years. The most recent CBC is from 12/07/2019 which shows a WBC of 13.7, the patient's platelets and hemoglobin were within normal limits, the patient's neutrophils were elevated at 8.4, the patient's lymphocytes were slightly elevated at 4.3. Her MCV is low at _. Tthe patient was referred to the clinic today for further evaluation and work-up into his condition.    Iron last checked in 2019.   Per chart review, it appears that the patient's leukocytosis can be seen dated back to 2008 which is the earliest records available to me.  Her WBCs have ranged between 9.6 K and ~16k. Primarily _.   Chronic pain? Pain management facility? The patient denies any recent fever, chills, night sweats, or weight loss. The patient denies any lymphadenopathy.  The patient denies any nausea, vomiting, diarrhea, or constipation. She denies abdominal fullness or early satiety.  Patient denies any recent viral infections.  The patient denies any recent sore throat, cough, skin infections, or dysuria. The patient denies steroid use except for _inhalers? How often use inhalers? The patient currently smokes _ packs of cigarettes per _ and has been smoking for _ years.  The patient  denies any new medications.  The patient denies any history of any autoimmune disorders and recently had been evaluated by Dr. Corena Pilgrim from rheumatology.  The patient denies any abnormal bleeding or bruising. HPI  Past Medical History:  Diagnosis Date  . Anxiety   . Common migraine with intractable migraine 10/02/2016  . Depression   . Diabetes mellitus   . Hypertension     Past Surgical History:  Procedure Laterality Date  . CHOLECYSTECTOMY    . TUBAL LIGATION      Family History  Problem Relation Age of Onset  . Diabetes Mother   . Hypertension Mother   . Renal Disease Mother   . Cancer Father   . Diabetes Sister   . Diabetes Brother   . Renal Disease Brother   . Diabetes Brother   . Diabetes Sister   . Diabetes Brother   . Diabetes Brother   . Diabetes Brother   . Healthy Daughter   . Healthy Daughter     Social History Social History   Tobacco Use  . Smoking status: Current Every Day Smoker    Packs/day: 0.25    Types: Cigarettes  . Smokeless tobacco: Never Used  Vaping Use  . Vaping Use: Former  Substance Use Topics  . Alcohol use: No  . Drug use: No    Allergies  Allergen Reactions  . Lisinopril     angioedema    Current Outpatient Medications  Medication Sig Dispense Refill  . Accu-Chek Softclix Lancets lancets Use as instructed 100 each 12  . acetaminophen (TYLENOL) 325 MG tablet Take 2 tablets (650 mg total) by mouth every 6 (  six) hours as needed. 30 tablet 0  . aspirin EC 81 MG tablet Take 81 mg by mouth every 6 (six) hours as needed for mild pain.     . blood glucose meter kit and supplies Dispense based on patient and insurance preference. Use up to four times daily as directed. (FOR ICD-10 E10.9, E11.9). 1 each 0  . butalbital-acetaminophen-caffeine (FIORICET) 50-325-40 MG tablet Take 1 tablet by mouth every 6 (six) hours as needed for headache. 20 tablet 0  . DULoxetine (CYMBALTA) 20 MG capsule Take 1 capsule (20 mg total) by mouth 2 (two)  times daily. 180 capsule 1  . EASY COMFORT PEN NEEDLES 31G X 5 MM MISC USE AS INSTRUCTED. TWICE DAILY. 100 each 1  . fluticasone (FLOVENT HFA) 44 MCG/ACT inhaler Inhale 2 puffs into the lungs 2 (two) times daily. 1 Inhaler 12  . gabapentin (NEURONTIN) 300 MG capsule TAKE 1 CAPSULE (300 MG TOTAL) BY MOUTH 3 (THREE) TIMES DAILY. 90 capsule 1  . glimepiride (AMARYL) 4 MG tablet Take 1 tablet (4 mg total) by mouth daily with breakfast. 90 tablet 1  . glucose blood (ACCU-CHEK AVIVA PLUS) test strip Use as instructed 100 each 12  . ibuprofen (ADVIL) 600 MG tablet Take 1 tablet (600 mg total) by mouth every 8 (eight) hours as needed for moderate pain. 90 tablet 0  . Lancets (ACCU-CHEK SOFT TOUCH) lancets Use as instructed 100 each 12  . LEVEMIR FLEXTOUCH 100 UNIT/ML FlexPen INJECT 50 UNITS INTO THE SKIN 2 (TWO) TIMES DAILY. 30 mL 0  . losartan (COZAAR) 25 MG tablet Take 1 tablet (25 mg total) by mouth daily. 90 tablet 1  . metFORMIN (GLUCOPHAGE) 1000 MG tablet TAKE 1 TABLET BY MOUTH 2 TIMES DAILY WITH A MEAL. 180 tablet 0  . methocarbamol (ROBAXIN) 500 MG tablet Take 1 tablet (500 mg total) by mouth every 8 (eight) hours as needed for muscle spasms. 90 tablet 1  . NOVOLOG FLEXPEN 100 UNIT/ML FlexPen For blood sugars 0-150 give 0 units of insulin, 200-250 give 2 units of insulin, 251-300 give 4 units, 300-351 give 6 units, 351-400 give 8 units, 401-450 give 10 units,> 451 give 12 units and call M.D. Discussed hypoglycemia protocol. 15 mL 3  . pravastatin (PRAVACHOL) 10 MG tablet Take 1 tablet (10 mg total) by mouth daily. 90 tablet 1  . PROVENTIL HFA 108 (90 Base) MCG/ACT inhaler INHALE 1 TO 2 PUFFS BY MOUTH EVERY 6 (SIX) HOURS AS NEEDED FOR WHEEZING OR SHORTNESS OF BREATH. (Patient taking differently: Inhale 1-2 puffs into the lungs every 6 (six) hours as needed for wheezing or shortness of breath. ) 6.7 g 1   No current facility-administered medications for this visit.    Review of Systems  {Ros -  complete:30496}  Physical Exam  RAL:{CHL ONC PE GENERAL:[phone]} SKIN: {CHL ONC PE JJOA:4166063016} HEAD: {CHL ONC PE WFUX:3235573220} EYES: {CHL ONC PE URKY:7062376283} EARS: {CHL ONC PE TDVV:6160737106} OROPHARYNX:{CHL ONC PE [phone]}  NECK: {CHL ONC PE YIRS:8546270350} LYMPH:  {CHL ONC PE KXFGH:8299371696} BREAST:{CHL ONC PE BREAST:[phone]} LUNGS: {CHL ONC PE VELFY:1017510258} HEART: {CHL ONC PE NIDPO:2423536144} ABDOMEN:{CHL ONC PE ABDOMEN:([phone]} BACK: {CHL ONC PE RXVQ:0086761950} EXTREMITIES:{CHL ONC PE EXTREMITIES:8471005777}  NEURO: {CHL ONC PE NEURO:3477594255}  PERFORMANCE STATUS: ECOG ***  LABORATORY DATA: Lab Results  Component Value Date   WBC 13.7 (H) 12/07/2019   HGB 13.5 12/07/2019   HCT 40.4 12/07/2019   MCV 75.0 (L) 12/07/2019   PLT 262 12/07/2019      Chemistry  Component Value Date/Time   NA 143 12/07/2019 2311   NA 143 08/01/2018 1100   K 3.4 (L) 12/07/2019 2311   CL 108 12/07/2019 2311   CO2 25 12/07/2019 2311   BUN 10 12/07/2019 2311   BUN 13 08/01/2018 1100   CREATININE 0.72 12/07/2019 2311      Component Value Date/Time   CALCIUM 9.0 12/07/2019 2311   ALKPHOS 133 (H) 12/07/2019 2311   AST 26 12/07/2019 2311   ALT 47 (H) 12/07/2019 2311   BILITOT 0.4 12/07/2019 2311   BILITOT 0.4 08/01/2018 1100       RADIOGRAPHIC STUDIES: No results found.  ASSESSMENT:   PLAN:  This is a very pleasant 49 year old African-American female referred to the clinic for evaluation of leukocytosis.     PLAN: repeat CBC, CMP, and LDH performed today.  The patient also had lab studies with BCR/ABL and flow cytometry.  The patient's CBC from today shows a persistently elevated WBC at _.  The patient's absolute lymphocytes are elevated at _.  The patient's neutrophils are slightly elevated at _.  The patient's hemoglobin and platelet count are within normal limits.  The patient see MP was unremarkable except for _.    Hemoglobin electro, iron studies.    The patient's BCR/ABL testing and flow cytometry are still pending.  _ believes that the patient's leukocytosis is likely reactive in nature, however; we will wait for the pending lab studies to rule out any myeloproliferative etiology of his leukocytosis.   We will not schedule any follow-up appointments at this time.  We will call the patient with the results of his pending lab studies.  If the patient's flow cytometry and BCR/ABL are unremarkable, then no further work-up is needed and the patient may follow with his PCP.  If the lab studies are abnormal, we will arrange for the patient to follow-up with our office.  All questions were answered. The patient knows to call the clinic with any problems, questions or concerns. We can certainly see the patient much sooner if necessary.  Thank you so much for allowing me to participate in the care of [facility]. I will continue to follow up the patient with you and assist in her care.  I spent {CHL ONC TIME VISIT - AOZHY:8657846962} counseling the patient face to face. The total time spent in the appointment was {CHL ONC TIME VISIT - XBMWU:1324401027}.  Disclaimer: This note was dictated with voice recognition software. Similar sounding words can inadvertently be transcribed and may not be corrected upon review.  The patient voices understanding of current disease status and treatment options and is in agreement with the current care plan.  All questions were answered. The patient knows to call the clinic with any problems, questions or concerns. We can certainly see the patient much sooner if necessary.  Thank you so much for allowing me to participate in the care of [facility]. I will continue to follow up the patient with you and assist in her care.  I spent {CHL ONC TIME VISIT - OZDGU:4403474259} counseling the patient face to face. The total time spent in the appointment was {CHL ONC TIME VISIT -  DGLOV:5643329518}.  Disclaimer: This note was dictated with voice recognition software. Similar sounding words can inadvertently be transcribed and may not be corrected upon review.   Tayton Decaire L Anders Hohmann March 22, 2020, 12:17 PM

## 2020-03-23 ENCOUNTER — Inpatient Hospital Stay: Payer: Medicaid Other

## 2020-03-23 ENCOUNTER — Inpatient Hospital Stay: Payer: Medicaid Other | Admitting: Physician Assistant

## 2020-03-23 ENCOUNTER — Telehealth: Payer: Self-pay | Admitting: Medical Oncology

## 2020-03-23 ENCOUNTER — Other Ambulatory Visit: Payer: Self-pay | Admitting: Physician Assistant

## 2020-03-23 DIAGNOSIS — D72829 Elevated white blood cell count, unspecified: Secondary | ICD-10-CM

## 2020-03-23 NOTE — Telephone Encounter (Signed)
Pt cancelled appt today . "She is getting new brakes on her car". Schedule message sent.

## 2020-03-24 ENCOUNTER — Telehealth: Payer: Self-pay | Admitting: Oncology

## 2020-03-24 ENCOUNTER — Ambulatory Visit (INDEPENDENT_AMBULATORY_CARE_PROVIDER_SITE_OTHER): Payer: Medicaid Other | Admitting: Orthopedic Surgery

## 2020-03-24 ENCOUNTER — Ambulatory Visit (INDEPENDENT_AMBULATORY_CARE_PROVIDER_SITE_OTHER): Payer: Medicaid Other

## 2020-03-24 VITALS — Ht 65.0 in | Wt 251.8 lb

## 2020-03-24 DIAGNOSIS — M25562 Pain in left knee: Secondary | ICD-10-CM

## 2020-03-24 DIAGNOSIS — M25561 Pain in right knee: Secondary | ICD-10-CM

## 2020-03-24 NOTE — Telephone Encounter (Signed)
Pt has been rescheduled to see Dr. Clelia Croft on 12/23 at 2pm. Pt aware to arrive 30 minutes early.

## 2020-03-25 MED ORDER — ACETAMINOPHEN-CODEINE #3 300-30 MG PO TABS: 1.0000 | ORAL_TABLET | Freq: Two times a day (BID) | ORAL | 0 refills | Status: DC | PRN

## 2020-03-27 ENCOUNTER — Encounter: Payer: Self-pay | Admitting: Orthopedic Surgery

## 2020-03-27 NOTE — Progress Notes (Signed)
Office Visit Note   Patient: [name]           Date of Birth: [birth year]           MRN: [id number] Visit Date: 03/24/2020 Requested by: Grayce Sessions, NP [address] [place],  [place] [address] PCP: Grayce Sessions, NP  Subjective: Chief Complaint  Patient presents with  . Left Knee - Pain  . Right Knee - Pain    HPI: [name] is a 49 y.o. female who presents to the office complaining of bilateral knee pain.  She states that her whole body hurts but her knees are the main source of her pain.  She has been diagnosed with rheumatoid arthritis by Dr. Corliss Skains, reportedly.  She states that she has ambulate with a walker most the time due to her knee pain.  She does have occasional groin pain.  She takes Goody powder and BC powder and states that Advil is no relief for her pain.  She does smoke about half a pack per day.  She has a history of diabetes last A1c was 8.6 for which she takes insulin and Metformin.  Injections are providing only transient relief at this point..                ROS: All systems reviewed are negative as they relate to the chief complaint within the history of present illness.  Patient denies fevers or chills.  Assessment & Plan: Visit Diagnoses:  1. Pain in both knees, unspecified chronicity     Plan: Patient is a 49 year old female who presents complaining of continued bilateral knee pain.  She has bilateral knee osteoarthritis with valgus deformity.  Significantly worse degenerative changes of the right knee on today's radiographs.  12 degree valgus deformity that is mostly correctable on exam today.  Over-the-counter medications are not very helpful and injections have not provided any significant relief for the last several series of injections.  She has been diagnosed with rheumatoid arthritis by Dr. Corliss Skains.  She would like to pursue total knee replacement.  Discussed the risks and benefits of the procedure including nerve and vessel  damage, knee instability, knee stiffness, prosthetic joint infection, falls after surgery.  Also discussed the postoperative rehab and recovery, with the intense amount of pain that patients go through after knee replacement.  She understands and wishes to proceed with surgery.  However, her A1c is too high at this time with an A1c of 8.6.  Goal is A1c below 8.  Additionally her BMI is 41.9 today.  She needs to reach about to 40 pounds to get her BMI under 40.  Recheck in 8 weeks to see how well controlled her diabetes is and recheck her BMI.  Follow-Up Instructions: No follow-ups on file.   Orders:  Orders Placed This Encounter  Procedures  . XR Knee 1-2 Views Right  . XR KNEE 3 VIEW LEFT   Meds ordered this encounter  Medications  . acetaminophen-codeine (TYLENOL #3) 300-30 MG tablet    Sig: Take 1 tablet by mouth every 12 (twelve) hours as needed for moderate pain.    Dispense:  40 tablet    Refill:  0      Procedures: No procedures performed   Clinical Data: No additional findings.  Objective: Vital Signs: Ht 5\' 5"  (1.651 m)   Wt 251 lb 12.8 oz (114.2 kg)   BMI 41.90 kg/m   Physical Exam:  Constitutional: Patient appears well-developed HEENT:  Head: Normocephalic  Eyes:EOM are normal Neck: Normal range of motion Cardiovascular: Normal rate Pulmonary/chest: Effort normal Neurologic: Patient is alert Skin: Skin is warm Psychiatric: Patient has normal mood and affect  Ortho Exam: Ortho exam demonstrates bilateral knees with mild valgus deformity that is fairly correctable.  She has full extension.  No flexion contracture.  Slightly worse valgus deformity of the right knee compared to the left knee.  Trace effusion present bilaterally.  No calf tenderness on exam.  Negative Homans' sign.  No significant pain with hip range of motion.  Able to perform straight leg raise bilaterally.  Specialty Comments:  No specialty comments available.  Imaging: No results  found.   PMFS History: Patient Active Problem List   Diagnosis Date Noted  . Hot flashes 10/02/2018  . Vaginal dryness 10/02/2018  . BIH (benign intracranial hypertension) 11/06/2017  . Somnolence, daytime 07/30/2017  . Snoring 07/30/2017  . Morbid obesity (HCC) 07/30/2017  . Migraine 10/02/2016  . Common migraine with intractable migraine 10/02/2016  . Type 2 diabetes mellitus without complication, without long-term current use of insulin (HCC) 08/17/2016   Past Medical History:  Diagnosis Date  . Anxiety   . Common migraine with intractable migraine 10/02/2016  . Depression   . Diabetes mellitus   . Hypertension     Family History  Problem Relation Age of Onset  . Diabetes Mother   . Hypertension Mother   . Renal Disease Mother   . Cancer Father   . Diabetes Sister   . Diabetes Brother   . Renal Disease Brother   . Diabetes Brother   . Diabetes Sister   . Diabetes Brother   . Diabetes Brother   . Diabetes Brother   . Healthy Daughter   . Healthy Daughter     Past Surgical History:  Procedure Laterality Date  . CHOLECYSTECTOMY    . TUBAL LIGATION     Social History   Occupational History  . Not on file  Tobacco Use  . Smoking status: Current Every Day Smoker    Packs/day: 0.25    Types: Cigarettes  . Smokeless tobacco: Never Used  Vaping Use  . Vaping Use: Former  Substance and Sexual Activity  . Alcohol use: No  . Drug use: No  . Sexual activity: Yes    Birth control/protection: None

## 2020-03-30 ENCOUNTER — Encounter: Payer: Self-pay | Admitting: Hematology

## 2020-04-01 ENCOUNTER — Other Ambulatory Visit: Payer: Self-pay

## 2020-04-01 ENCOUNTER — Inpatient Hospital Stay: Payer: Medicaid Other

## 2020-04-01 ENCOUNTER — Inpatient Hospital Stay (HOSPITAL_BASED_OUTPATIENT_CLINIC_OR_DEPARTMENT_OTHER): Payer: Medicaid Other | Admitting: Oncology

## 2020-04-01 VITALS — BP 142/68 | HR 79 | Temp 97.4°F | Resp 18 | Ht 65.0 in | Wt 254.5 lb

## 2020-04-01 DIAGNOSIS — Z809 Family history of malignant neoplasm, unspecified: Secondary | ICD-10-CM | POA: Diagnosis not present

## 2020-04-01 DIAGNOSIS — R718 Other abnormality of red blood cells: Secondary | ICD-10-CM

## 2020-04-01 DIAGNOSIS — D72829 Elevated white blood cell count, unspecified: Secondary | ICD-10-CM

## 2020-04-01 DIAGNOSIS — F17210 Nicotine dependence, cigarettes, uncomplicated: Secondary | ICD-10-CM | POA: Diagnosis not present

## 2020-04-01 LAB — CBC WITH DIFFERENTIAL (CANCER CENTER ONLY)
Abs Immature Granulocytes: 0.04 10*3/uL (ref 0.00–0.07)
Basophils Absolute: 0 10*3/uL (ref 0.0–0.1)
Basophils Relative: 0 %
Eosinophils Absolute: 0.1 10*3/uL (ref 0.0–0.5)
Eosinophils Relative: 1 %
HCT: 42.3 % (ref 36.0–46.0)
Hemoglobin: 14.1 g/dL (ref 12.0–15.0)
Immature Granulocytes: 0 %
Lymphocytes Relative: 35 %
Lymphs Abs: 4 10*3/uL (ref 0.7–4.0)
MCH: 25.1 pg — ABNORMAL LOW (ref 26.0–34.0)
MCHC: 33.3 g/dL (ref 30.0–36.0)
MCV: 75.4 fL — ABNORMAL LOW (ref 80.0–100.0)
Monocytes Absolute: 0.7 10*3/uL (ref 0.1–1.0)
Monocytes Relative: 6 %
Neutro Abs: 6.5 10*3/uL (ref 1.7–7.7)
Neutrophils Relative %: 58 %
Platelet Count: 251 10*3/uL (ref 150–400)
RBC: 5.61 MIL/uL — ABNORMAL HIGH (ref 3.87–5.11)
RDW: 14.6 % (ref 11.5–15.5)
WBC Count: 11.3 10*3/uL — ABNORMAL HIGH (ref 4.0–10.5)
nRBC: 0 % (ref 0.0–0.2)

## 2020-04-01 NOTE — Progress Notes (Signed)
Reason for the request:    Leukocytosis  HPI: I was asked by Juluis Mire, NP   to evaluate [name] for evaluation of leukocytosis.  She is a 49 year old woman with history of hypertension, diabetes and a positive ANA.  She was found to have a fall leukocytosis based on CBC and iron August 2021.  Her white cell count at that point was 13.7 with a hemoglobin of 13.5 and a platelet count of 262.  Her MCV was 75 and she had a normal differential.  Reviewing her CBC results dating back to 2008 showed a similar pattern with fluctuating white cell count between normal range of 9.7 2018 as high as 16,000 in 2008.  The differential has been always normal during that interval.  Clinically, she reports vague complaints of body aches and occasional discomfort but still able to perform all activities of daily living.  Continues to work at this time.  She denies any fevers chills.  She denies any weight loss or appetite changes.  She denies any lymphadenopathy.  She does not report any headaches, blurry vision, syncope or seizures. Does not report any fevers, chills or sweats.  Does not report any cough, wheezing or hemoptysis.  Does not report any chest pain, palpitation, orthopnea or leg edema.  Does not report any nausea, vomiting or abdominal pain.  Does not report any constipation or diarrhea.  Does not report any skeletal complaints.    Does not report frequency, urgency or hematuria.  Does not report any skin rashes or lesions. Does not report any heat or cold intolerance.  Does not report any lymphadenopathy or petechiae.  Does not report any anxiety or depression.  Remaining review of systems is negative.    Past Medical History:  Diagnosis Date  . Anxiety   . Common migraine with intractable migraine 10/02/2016  . Depression   . Diabetes mellitus   . Hypertension   :  Past Surgical History:  Procedure Laterality Date  . CHOLECYSTECTOMY    . TUBAL LIGATION    :   Current Outpatient  Medications:  .  Accu-Chek Softclix Lancets lancets, Use as instructed, Disp: 100 each, Rfl: 12 .  acetaminophen-codeine (TYLENOL #3) 300-30 MG tablet, Take 1 tablet by mouth every 12 (twelve) hours as needed for moderate pain., Disp: 40 tablet, Rfl: 0 .  aspirin EC 81 MG tablet, Take 81 mg by mouth every 6 (six) hours as needed for mild pain. , Disp: , Rfl:  .  blood glucose meter kit and supplies, Dispense based on patient and insurance preference. Use up to four times daily as directed. (FOR ICD-10 E10.9, E11.9)., Disp: 1 each, Rfl: 0 .  DULoxetine (CYMBALTA) 20 MG capsule, Take 1 capsule (20 mg total) by mouth 2 (two) times daily., Disp: 180 capsule, Rfl: 1 .  EASY COMFORT PEN NEEDLES 31G X 5 MM MISC, USE AS INSTRUCTED. TWICE DAILY., Disp: 100 each, Rfl: 1 .  fluticasone (FLOVENT HFA) 44 MCG/ACT inhaler, Inhale 2 puffs into the lungs 2 (two) times daily., Disp: 1 Inhaler, Rfl: 12 .  gabapentin (NEURONTIN) 300 MG capsule, TAKE 1 CAPSULE (300 MG TOTAL) BY MOUTH 3 (THREE) TIMES DAILY., Disp: 90 capsule, Rfl: 1 .  glimepiride (AMARYL) 4 MG tablet, Take 1 tablet (4 mg total) by mouth daily with breakfast., Disp: 90 tablet, Rfl: 1 .  glucose blood (ACCU-CHEK AVIVA PLUS) test strip, Use as instructed, Disp: 100 each, Rfl: 12 .  ibuprofen (ADVIL) 600 MG tablet, Take 1 tablet (  600 mg total) by mouth every 8 (eight) hours as needed for moderate pain., Disp: 90 tablet, Rfl: 0 .  Lancets (ACCU-CHEK SOFT TOUCH) lancets, Use as instructed, Disp: 100 each, Rfl: 12 .  LEVEMIR FLEXTOUCH 100 UNIT/ML FlexPen, INJECT 50 UNITS INTO THE SKIN 2 (TWO) TIMES DAILY., Disp: 30 mL, Rfl: 0 .  losartan (COZAAR) 25 MG tablet, Take 1 tablet (25 mg total) by mouth daily., Disp: 90 tablet, Rfl: 1 .  metFORMIN (GLUCOPHAGE) 1000 MG tablet, TAKE 1 TABLET BY MOUTH 2 TIMES DAILY WITH A MEAL., Disp: 180 tablet, Rfl: 0 .  methocarbamol (ROBAXIN) 500 MG tablet, Take 1 tablet (500 mg total) by mouth every 8 (eight) hours as needed for  muscle spasms., Disp: 90 tablet, Rfl: 1 .  NOVOLOG FLEXPEN 100 UNIT/ML FlexPen, For blood sugars 0-150 give 0 units of insulin, 200-250 give 2 units of insulin, 251-300 give 4 units, 300-351 give 6 units, 351-400 give 8 units, 401-450 give 10 units,> 451 give 12 units and call M.D. Discussed hypoglycemia protocol., Disp: 15 mL, Rfl: 3 .  pravastatin (PRAVACHOL) 10 MG tablet, Take 1 tablet (10 mg total) by mouth daily., Disp: 90 tablet, Rfl: 1 .  PROVENTIL HFA 108 (90 Base) MCG/ACT inhaler, INHALE 1 TO 2 PUFFS BY MOUTH EVERY 6 (SIX) HOURS AS NEEDED FOR WHEEZING OR SHORTNESS OF BREATH. (Patient taking differently: Inhale 1-2 puffs into the lungs every 6 (six) hours as needed for wheezing or shortness of breath. ), Disp: 6.7 g, Rfl: 1:  Allergies  Allergen Reactions  . Lisinopril     angioedema  :  Family History  Problem Relation Age of Onset  . Diabetes Mother   . Hypertension Mother   . Renal Disease Mother   . Cancer Father   . Diabetes Sister   . Diabetes Brother   . Renal Disease Brother   . Diabetes Brother   . Diabetes Sister   . Diabetes Brother   . Diabetes Brother   . Diabetes Brother   . Healthy Daughter   . Healthy Daughter   :  Social History   Socioeconomic History  . Marital status: Single    Spouse name: Not on file  . Number of children: Not on file  . Years of education: Not on file  . Highest education level: Not on file  Occupational History  . Not on file  Tobacco Use  . Smoking status: Current Every Day Smoker    Packs/day: 0.25    Types: Cigarettes  . Smokeless tobacco: Never Used  Vaping Use  . Vaping Use: Former  Substance and Sexual Activity  . Alcohol use: No  . Drug use: No  . Sexual activity: Yes    Birth control/protection: None  Other Topics Concern  . Not on file  Social History Narrative   Right handed    Lives with daughter   Caffeine use: Drinks coffee/tea/soda sometimes   Social Determinants of Health   Financial Resource  Strain: Not on file  Food Insecurity: Not on file  Transportation Needs: Not on file  Physical Activity: Not on file  Stress: Not on file  Social Connections: Not on file  Intimate Partner Violence: Not on file  :  Pertinent items are noted in HPI.  Exam: Blood pressure (!) 142/68, pulse 79, temperature (!) 97.4 F (36.3 C), temperature source Tympanic, resp. rate 18, height '5\' 5"'  (1.651 m), weight 254 lb 8 oz (115.4 kg), SpO2 100 %.  ECOG 1  General appearance: alert and cooperative appeared without distress. Head: atraumatic without any abnormalities. Eyes: conjunctivae/corneas clear. PERRL.  Sclera anicteric. Throat: lips, mucosa, and tongue normal; without oral thrush or ulcers. Resp: clear to auscultation bilaterally without rhonchi, wheezes or dullness to percussion. Cardio: regular rate and rhythm, S1, S2 normal, no murmur, click, rub or gallop GI: soft, non-tender; bowel sounds normal; no masses,  no organomegaly Skin: Skin color, texture, turgor normal. No rashes or lesions Lymph nodes: Cervical, supraclavicular, and axillary nodes normal. Neurologic: Grossly normal without any motor, sensory or deep tendon reflexes. Musculoskeletal: No joint deformity or effusion.    Assessment and Plan:    49 year old woman with:  1.  Leukocytosis detected as far back as 2008 with fluctuating white cell count between normal range and up to 16,000.  Her white cell count and August 2021 was 13.7.  The differential diagnosis was reviewed today.  Reactive leukocytosis remains the most likely etiology.  Myeloproliferative disorder such as chronic myelogenous leukemia, myelofibrosis among others are considered less likely.  For completeness, we will obtain an molecular testing including BCR/ABL and JAK2 mutation.  This testing is negative, no further intervention is needed at this time.  2.  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  3.  Follow-up: Will be determined pending the results of her testing.   45  minutes were dedicated to this visit. The time was spent on reviewing laboratory data, discussing treatment options, discussing differential diagnosis and answering questions regarding future plan.     A copy of this consult has been forwarded to the requesting physician.

## 2020-04-06 ENCOUNTER — Ambulatory Visit (INDEPENDENT_AMBULATORY_CARE_PROVIDER_SITE_OTHER): Payer: Medicaid Other | Admitting: Primary Care

## 2020-04-09 ENCOUNTER — Other Ambulatory Visit (INDEPENDENT_AMBULATORY_CARE_PROVIDER_SITE_OTHER): Payer: Self-pay | Admitting: Primary Care

## 2020-04-09 DIAGNOSIS — M2550 Pain in unspecified joint: Secondary | ICD-10-CM

## 2020-04-09 DIAGNOSIS — K219 Gastro-esophageal reflux disease without esophagitis: Secondary | ICD-10-CM

## 2020-04-13 ENCOUNTER — Telehealth: Payer: Self-pay

## 2020-04-13 ENCOUNTER — Ambulatory Visit (HOSPITAL_COMMUNITY)
Admission: EM | Admit: 2020-04-13 | Discharge: 2020-04-13 | Disposition: A | Discharge status: Home / Self Care | Payer: Medicaid Other | Attending: Student | Admitting: Student

## 2020-04-13 ENCOUNTER — Encounter (HOSPITAL_COMMUNITY): Payer: Self-pay

## 2020-04-13 ENCOUNTER — Other Ambulatory Visit: Payer: Self-pay

## 2020-04-13 DIAGNOSIS — F17210 Nicotine dependence, cigarettes, uncomplicated: Secondary | ICD-10-CM | POA: Insufficient documentation

## 2020-04-13 DIAGNOSIS — U071 COVID-19: Secondary | ICD-10-CM | POA: Diagnosis not present

## 2020-04-13 DIAGNOSIS — Z79899 Other long term (current) drug therapy: Secondary | ICD-10-CM | POA: Insufficient documentation

## 2020-04-13 DIAGNOSIS — Z7982 Long term (current) use of aspirin: Secondary | ICD-10-CM | POA: Insufficient documentation

## 2020-04-13 DIAGNOSIS — Z791 Long term (current) use of non-steroidal anti-inflammatories (NSAID): Secondary | ICD-10-CM | POA: Diagnosis not present

## 2020-04-13 DIAGNOSIS — J069 Acute upper respiratory infection, unspecified: Secondary | ICD-10-CM | POA: Diagnosis not present

## 2020-04-13 DIAGNOSIS — F32A Depression, unspecified: Secondary | ICD-10-CM | POA: Diagnosis not present

## 2020-04-13 DIAGNOSIS — Z794 Long term (current) use of insulin: Secondary | ICD-10-CM | POA: Insufficient documentation

## 2020-04-13 DIAGNOSIS — I10 Essential (primary) hypertension: Secondary | ICD-10-CM | POA: Insufficient documentation

## 2020-04-13 DIAGNOSIS — E119 Type 2 diabetes mellitus without complications: Secondary | ICD-10-CM | POA: Insufficient documentation

## 2020-04-13 DIAGNOSIS — R519 Headache, unspecified: Secondary | ICD-10-CM | POA: Insufficient documentation

## 2020-04-13 DIAGNOSIS — F419 Anxiety disorder, unspecified: Secondary | ICD-10-CM | POA: Diagnosis not present

## 2020-04-13 LAB — RESP PANEL BY RT-PCR (FLU A&B, COVID) ARPGX2
Influenza A by PCR: NEGATIVE
Influenza B by PCR: NEGATIVE
SARS Coronavirus 2 by RT PCR: POSITIVE — AB

## 2020-04-13 LAB — JAK2 (INCLUDING V617F AND EXON 12), MPL,& CALR-NEXT GEN SEQ

## 2020-04-13 LAB — BCR ABL1 FISH (GENPATH)

## 2020-04-13 MED ORDER — DM-GUAIFENESIN ER 30-600 MG PO TB12: 1.0000 | ORAL_TABLET | Freq: Two times a day (BID) | ORAL | 0 refills | Status: AC

## 2020-04-13 MED ORDER — PROMETHAZINE-DM 6.25-15 MG/5ML PO SYRP: 5.0000 mL | ORAL_SOLUTION | Freq: Four times a day (QID) | ORAL | 0 refills | Status: DC | PRN

## 2020-04-13 MED ORDER — FLUTICASONE PROPIONATE 50 MCG/ACT NA SUSP: 1.0000 | Freq: Every day | NASAL | 2 refills | Status: DC

## 2020-04-13 MED ORDER — BENZONATATE 100 MG PO CAPS: 100.0000 mg | ORAL_CAPSULE | Freq: Three times a day (TID) | ORAL | 0 refills | Status: DC

## 2020-04-13 NOTE — ED Triage Notes (Signed)
Pt presents with headache, nasal congestion, body aches and cough x 2 days.

## 2020-04-13 NOTE — Telephone Encounter (Signed)
Called patient and let her know that per Dr. Clelia Croft all labs are normal. Patient verbalized understanding.

## 2020-04-13 NOTE — ED Provider Notes (Signed)
[facility]    CSN: [id number] Arrival date & time: 04/13/20  1113      History   Chief Complaint Chief Complaint  Patient presents with  . Nasal Congestion    Chills    . Chills  . Generalized Body Aches    HPI [name] is a 50 y.o. female Presenting for URI symptoms for 2 days. History of anxiety, migraines, depression, diabetes, hypertension. Presenting with headache, congestion, nonproductive cough, body aches.   Denies fevers/chills, n/v/d, shortness of breath, chest pain, facial pain, teeth pain, sore throat, loss of taste/smell, swollen lymph nodes, ear pain.  Denies worst headache of life, thunderclap headache, weakness/sensation changes in arms/legs, vision changes, shortness of breath, chest pain/pressure, photophobia, phonophobia, n/v/d.  Denies chest pain, shortness of breath, confusion, high fevers.       HPI  Past Medical History:  Diagnosis Date  . Anxiety   . Common migraine with intractable migraine 10/02/2016  . Depression   . Diabetes mellitus   . Hypertension     Patient Active Problem List   Diagnosis Date Noted  . Hot flashes 10/02/2018  . Vaginal dryness 10/02/2018  . BIH (benign intracranial hypertension) 11/06/2017  . Somnolence, daytime 07/30/2017  . Snoring 07/30/2017  . Morbid obesity ([place]) 07/30/2017  . Migraine 10/02/2016  . Common migraine with intractable migraine 10/02/2016  . Type 2 diabetes mellitus without complication, without long-term current use of insulin ([place]) 08/17/2016    Past Surgical History:  Procedure Laterality Date  . CHOLECYSTECTOMY    . TUBAL LIGATION      OB History    Gravida  5   Para  3   Term  3   Preterm      AB  2   Living  3     SAB  1   IAB  1   Ectopic      Multiple      Live Births               Home Medications    Prior to Admission medications   Medication Sig Start Date End Date Taking? Authorizing Provider  benzonatate (TESSALON) 100 MG  capsule Take 1 capsule (100 mg total) by mouth every 8 (eight) hours. 04/13/20  Yes Hazel Sams, PA-C  dextromethorphan-guaiFENesin [facility] DM) 30-600 MG 12hr tablet Take 1 tablet by mouth 2 (two) times daily for 7 days. 04/13/20 04/20/20 Yes Hazel Sams, PA-C  fluticasone (FLONASE) 50 MCG/ACT nasal spray Place 1 spray into both nostrils daily. 04/13/20  Yes Hazel Sams, PA-C  promethazine-dextromethorphan (PROMETHAZINE-DM) 6.25-15 MG/5ML syrup Take 5 mLs by mouth 4 (four) times daily as needed for cough. 04/13/20  Yes Hazel Sams, PA-C  Accu-Chek Softclix Lancets lancets Use as instructed 12/29/19   Kerin Perna, NP  acetaminophen-codeine (TYLENOL #3) 300-30 MG tablet Take 1 tablet by mouth every 12 (twelve) hours as needed for moderate pain. 03/25/20   Magnant, Gerrianne Scale, PA-C  aspirin EC 81 MG tablet Take 81 mg by mouth every 6 (six) hours as needed for mild pain.     [provider]  blood glucose meter kit and supplies Dispense based on patient and insurance preference. Use up to four times daily as directed. (FOR ICD-10 E10.9, E11.9). 01/05/20   Kerin Perna, NP  celecoxib (CELEBREX) 200 MG capsule TAKE 1 CAPSULE BY MOUTH 2 TIMES DAILY. 04/11/20   Kerin Perna, NP  DULoxetine (CYMBALTA) 20 MG capsule Take  1 capsule (20 mg total) by mouth 2 (two) times daily. 03/09/20   Kerin Perna, NP  EASY COMFORT PEN NEEDLES 31G X 5 MM MISC USE AS INSTRUCTED. TWICE DAILY. 12/04/19   Kerin Perna, NP  fluticasone (FLOVENT HFA) 44 MCG/ACT inhaler Inhale 2 puffs into the lungs 2 (two) times daily. 01/19/19   Kerin Perna, NP  gabapentin (NEURONTIN) 300 MG capsule TAKE 1 CAPSULE (300 MG TOTAL) BY MOUTH 3 (THREE) TIMES DAILY. 04/11/20   Kerin Perna, NP  glimepiride (AMARYL) 4 MG tablet Take 1 tablet (4 mg total) by mouth daily with breakfast. 01/05/20   Kerin Perna, NP  glucose blood (ACCU-CHEK AVIVA PLUS) test strip Use as instructed 12/29/19    Kerin Perna, NP  Lancets (ACCU-CHEK SOFT TOUCH) lancets Use as instructed 12/29/19   Kerin Perna, NP  LEVEMIR FLEXTOUCH 100 UNIT/ML FlexPen INJECT 50 UNITS INTO THE SKIN 2 (TWO) TIMES DAILY. 02/16/20   Kerin Perna, NP  losartan (COZAAR) 25 MG tablet Take 1 tablet (25 mg total) by mouth daily. 01/05/20   Kerin Perna, NP  metFORMIN (GLUCOPHAGE) 1000 MG tablet TAKE 1 TABLET BY MOUTH 2 TIMES DAILY WITH A MEAL. 03/08/20   Kerin Perna, NP  methocarbamol (ROBAXIN) 500 MG tablet Take 1 tablet (500 mg total) by mouth every 8 (eight) hours as needed for muscle spasms. 03/09/20   Kerin Perna, NP  NOVOLOG FLEXPEN 100 UNIT/ML FlexPen For blood sugars 0-150 give 0 units of insulin, 200-250 give 2 units of insulin, 251-300 give 4 units, 300-351 give 6 units, 351-400 give 8 units, 401-450 give 10 units,> 451 give 12 units and call M.D. Discussed hypoglycemia protocol. 01/05/20   Kerin Perna, NP  pantoprazole (PROTONIX) 40 MG tablet TAKE 1 TABLET (40 MG TOTAL) BY MOUTH DAILY. 04/11/20   Kerin Perna, NP  pravastatin (PRAVACHOL) 10 MG tablet Take 1 tablet (10 mg total) by mouth daily. 01/05/20   Kerin Perna, NP  PROVENTIL HFA 108 (90 Base) MCG/ACT inhaler INHALE 1 TO 2 PUFFS BY MOUTH EVERY 6 (SIX) HOURS AS NEEDED FOR WHEEZING OR SHORTNESS OF BREATH. Patient taking differently: Inhale 1-2 puffs into the lungs every 6 (six) hours as needed for wheezing or shortness of breath.  01/10/19   Kerin Perna, NP    Family History Family History  Problem Relation Age of Onset  . Diabetes Mother   . Hypertension Mother   . Renal Disease Mother   . Cancer Father   . Diabetes Sister   . Diabetes Brother   . Renal Disease Brother   . Diabetes Brother   . Diabetes Sister   . Diabetes Brother   . Diabetes Brother   . Diabetes Brother   . Healthy Daughter   . Healthy Daughter     Social History Social History   Tobacco Use  . Smoking status:  Current Every Day Smoker    Packs/day: 0.25    Types: Cigarettes  . Smokeless tobacco: Never Used  Vaping Use  . Vaping Use: Former  Substance Use Topics  . Alcohol use: No  . Drug use: No     Allergies   Lisinopril   Review of Systems Review of Systems  Constitutional: Negative for appetite change, chills and fever.  HENT: Positive for congestion. Negative for ear pain, rhinorrhea, sinus pressure, sinus pain and sore throat.   Eyes: Negative for redness and visual disturbance.  Respiratory: Positive for cough.  Negative for chest tightness, shortness of breath and wheezing.   Cardiovascular: Negative for chest pain and palpitations.  Gastrointestinal: Negative for abdominal pain, constipation, diarrhea, nausea and vomiting.  Genitourinary: Negative for dysuria, frequency and urgency.  Musculoskeletal: Positive for myalgias.  Neurological: Negative for dizziness, weakness and headaches.  Psychiatric/Behavioral: Negative for confusion.  All other systems reviewed and are negative.    Physical Exam Triage Vital Signs ED Triage Vitals  Enc Vitals Group     BP 04/13/20 1425 140/85     Pulse Rate 04/13/20 1425 85     Resp 04/13/20 1425 (!) 22     Temp 04/13/20 1425 99.6 F (37.6 C)     Temp Source 04/13/20 1425 Oral     SpO2 04/13/20 1425 98 %     Weight --      Height --      Head Circumference --      Peak Flow --      Pain Score 04/13/20 1424 10     Pain Loc --      Pain Edu? --      Excl. in [place]? --    No data found.  Updated Vital Signs BP 140/85 (BP Location: Left Arm)   Pulse 85   Temp 99.6 F (37.6 C) (Oral)   Resp (!) 22   SpO2 98%   Visual Acuity Right Eye Distance:   Left Eye Distance:   Bilateral Distance:    Right Eye Near:   Left Eye Near:    Bilateral Near:     Physical Exam Vitals reviewed.  Constitutional:      General: She is not in acute distress.    Appearance: Normal appearance. She is not ill-appearing.  HENT:     Head:  Normocephalic and atraumatic.     Right Ear: Hearing, tympanic membrane, ear canal and external ear normal. No swelling or tenderness. There is no impacted cerumen. No mastoid tenderness. Tympanic membrane is not perforated, erythematous, retracted or bulging.     Left Ear: Hearing, tympanic membrane, ear canal and external ear normal. No swelling or tenderness. There is no impacted cerumen. No mastoid tenderness. Tympanic membrane is not perforated, erythematous, retracted or bulging.     Nose:     Right Sinus: No maxillary sinus tenderness or frontal sinus tenderness.     Left Sinus: No maxillary sinus tenderness or frontal sinus tenderness.     Mouth/Throat:     Mouth: Mucous membranes are moist.     Pharynx: Uvula midline. No oropharyngeal exudate or posterior oropharyngeal erythema.     Tonsils: No tonsillar exudate.  Cardiovascular:     Rate and Rhythm: Normal rate and regular rhythm.     Heart sounds: Normal heart sounds.  Pulmonary:     Breath sounds: Normal breath sounds and air entry. No wheezing, rhonchi or rales.  Chest:     Chest wall: No tenderness.  Abdominal:     General: Abdomen is flat. Bowel sounds are normal.     Tenderness: There is no abdominal tenderness. There is no guarding or rebound.  Lymphadenopathy:     Cervical: No cervical adenopathy.  Neurological:     General: No focal deficit present.     Mental Status: She is alert and oriented to person, place, and time.  Psychiatric:        Attention and Perception: Attention and perception normal.        Mood and Affect: Mood and affect normal.  Behavior: Behavior normal. Behavior is cooperative.        Thought Content: Thought content normal.        Judgment: Judgment normal.      UC Treatments / Results  Labs (all labs ordered are listed, but only abnormal results are displayed) Labs Reviewed  RESP PANEL BY RT-PCR (FLU A&B, COVID) [id number]    EKG   Radiology No results  found.  Procedures Procedures (including critical care time)  Medications Ordered in UC Medications - No data to display  Initial Impression / Assessment and Plan / UC Course  I have reviewed the triage vital signs and the nursing notes.  Pertinent labs & imaging results that were available during my care of the patient were reviewed by me and considered in my medical decision making (see chart for details).     Covid and influenza tests sent today. Isolation precautions per CDC guidelines until negative result. Symptomatic relief with OTC Mucinex, Nyquil, etc. Return precautions- new/worsening fevers/chills, shortness of breath, chest pain, abd pain, etc.   -Tessalon as needed for cough. Take one pill up to 3x daily (every 8 hours) -Promethazine DM cough syrup for congestion/cough. This could make you drowsy, so take at night before bed. -Flonase nasal steroid for congestion and sinus inflammation  -Mucinex DM for congestion   We are currently awaiting result of your PCR covid-19 test. Please isolate at home while awaiting these results. If your test is positive for Covid-19, continue to isolate at home for 5 days if you have mild symptoms, or a total of 10 days from symptom onset if you have more severe symptoms. If you quarantine for a shorter period of time (i.e. 5 days), make sure to wear a mask until day 10 of symptoms. Treat your symptoms at home with OTC remedies like tylenol/ibuprofen, mucinex, nyquil, etc. Seek medical attention if you develop high fevers, chest pain, shortness of breath, ear pain, facial pain, etc. Make sure to get up and move around every 2-3 hours while convalescing to help prevent blood clots. Drink plenty of fluids, and rest as much as possible.   Final Clinical Impressions(s) / UC Diagnoses   Final diagnoses:  Viral upper respiratory tract infection     Discharge Instructions     -Tessalon as needed for cough. Take one pill up to 3x daily (every 8  hours) -Promethazine DM cough syrup for congestion/cough. This could make you drowsy, so take at night before bed. -Flonase nasal steroid for congestion and sinus inflammation  -Mucinex DM for congestion   We are currently awaiting result of your PCR covid-19 test. Please isolate at home while awaiting these results. If your test is positive for Covid-19, continue to isolate at home for 5 days if you have mild symptoms, or a total of 10 days from symptom onset if you have more severe symptoms. If you quarantine for a shorter period of time (i.e. 5 days), make sure to wear a mask until day 10 of symptoms. Treat your symptoms at home with OTC remedies like tylenol/ibuprofen, mucinex, nyquil, etc. Seek medical attention if you develop high fevers, chest pain, shortness of breath, ear pain, facial pain, etc. Make sure to get up and move around every 2-3 hours while convalescing to help prevent blood clots. Drink plenty of fluids, and rest as much as possible.     ED Prescriptions    Medication Sig Dispense Auth. Provider   benzonatate (TESSALON) 100 MG capsule Take 1 capsule (100 mg  total) by mouth every 8 (eight) hours. 21 capsule Hazel Sams, PA-C   promethazine-dextromethorphan (PROMETHAZINE-DM) 6.25-15 MG/5ML syrup Take 5 mLs by mouth 4 (four) times daily as needed for cough. 118 mL Hazel Sams, PA-C   dextromethorphan-guaiFENesin [facility] DM) 30-600 MG 12hr tablet Take 1 tablet by mouth 2 (two) times daily for 7 days. 14 tablet Hazel Sams, PA-C   fluticasone [facility]) 50 MCG/ACT nasal spray Place 1 spray into both nostrils daily. 15 mL Hazel Sams, PA-C     PDMP not reviewed this encounter.   Hazel Sams, PA-C 04/13/20 1450

## 2020-04-13 NOTE — Discharge Instructions (Addendum)
-  Tessalon as needed for cough. Take one pill up to 3x daily (every 8 hours) -Promethazine DM cough syrup for congestion/cough. This could make you drowsy, so take at night before bed. -Flonase nasal steroid for congestion and sinus inflammation  -Mucinex DM for congestion   We are currently awaiting result of your PCR covid-19 test. Please isolate at home while awaiting these results. If your test is positive for Covid-19, continue to isolate at home for 5 days if you have mild symptoms, or a total of 10 days from symptom onset if you have more severe symptoms. If you quarantine for a shorter period of time (i.e. 5 days), make sure to wear a mask until day 10 of symptoms. Treat your symptoms at home with OTC remedies like tylenol/ibuprofen, mucinex, nyquil, etc. Seek medical attention if you develop high fevers, chest pain, shortness of breath, ear pain, facial pain, etc. Make sure to get up and move around every 2-3 hours while convalescing to help prevent blood clots. Drink plenty of fluids, and rest as much as possible.

## 2020-04-13 NOTE — Telephone Encounter (Signed)
-----   Message from Benjiman Core, MD sent at 04/13/2020 10:33 AM EST ----- Please let her know all her labs are normal.

## 2020-05-03 ENCOUNTER — Ambulatory Visit (HOSPITAL_COMMUNITY): Payer: Medicaid Other | Admitting: Psychiatry

## 2020-05-06 ENCOUNTER — Encounter (INDEPENDENT_AMBULATORY_CARE_PROVIDER_SITE_OTHER): Payer: Self-pay | Admitting: Primary Care

## 2020-05-06 ENCOUNTER — Ambulatory Visit (INDEPENDENT_AMBULATORY_CARE_PROVIDER_SITE_OTHER): Payer: Medicaid Other | Admitting: Primary Care

## 2020-05-06 ENCOUNTER — Other Ambulatory Visit: Payer: Self-pay

## 2020-05-06 VITALS — BP 156/99 | HR 77 | Temp 98.1°F | Ht 65.0 in | Wt 251.6 lb

## 2020-05-06 DIAGNOSIS — E119 Type 2 diabetes mellitus without complications: Secondary | ICD-10-CM

## 2020-05-06 DIAGNOSIS — G894 Chronic pain syndrome: Secondary | ICD-10-CM

## 2020-05-06 DIAGNOSIS — I10 Essential (primary) hypertension: Secondary | ICD-10-CM

## 2020-05-06 LAB — POCT GLYCOSYLATED HEMOGLOBIN (HGB A1C): Hemoglobin A1C: 11.5 % — AB (ref 4.0–5.6)

## 2020-05-06 LAB — GLUCOSE, POCT (MANUAL RESULT ENTRY): POC Glucose: 295 mg/dl — AB (ref 70–99)

## 2020-05-06 MED ORDER — AMLODIPINE BESYLATE 10 MG PO TABS: 10.0000 mg | ORAL_TABLET | Freq: Every day | ORAL | 3 refills | Status: DC

## 2020-05-06 MED ORDER — LOSARTAN POTASSIUM 50 MG PO TABS: 50.0000 mg | ORAL_TABLET | Freq: Every day | ORAL | 1 refills | Status: DC

## 2020-05-06 MED ORDER — IBUPROFEN 800 MG PO TABS
800.0000 mg | ORAL_TABLET | Freq: Three times a day (TID) | ORAL | 1 refills | Status: DC | PRN
Start: 2020-05-06 — End: 2020-08-10

## 2020-05-06 MED ORDER — LOSARTAN POTASSIUM 50 MG PO TABS: 25.0000 mg | ORAL_TABLET | Freq: Every day | ORAL | 1 refills | Status: DC

## 2020-05-06 NOTE — Progress Notes (Signed)
Established Patient Office Visit  Subjective:  Patient ID: [name], female    DOB: 09-Oct-1970  Age: 50 y.o. MRN: [id number]  CC: No chief complaint on file.   HPI [name] presents for the management of Type 2 diabetes DIABETES-Hypoglycemic episodes:no, Polydipsia/polyuria: yes, Visual disturbance: no, Chest pain: no, Paresthesias: yes Glucose Monitoring: yes- Fasting 120-485 . Elevated Bp Denies shortness of breath, headaches, chest pain or lower extremity edema, sudden onset, vision changes, unilateral weakness, dizziness, paresthesias    Past Medical History:  Diagnosis Date  . Anxiety   . Common migraine with intractable migraine 10/02/2016  . Depression   . Diabetes mellitus   . Hypertension     Past Surgical History:  Procedure Laterality Date  . CHOLECYSTECTOMY    . TUBAL LIGATION      Family History  Problem Relation Age of Onset  . Diabetes Mother   . Hypertension Mother   . Renal Disease Mother   . Cancer Father   . Diabetes Sister   . Diabetes Brother   . Renal Disease Brother   . Diabetes Brother   . Diabetes Sister   . Diabetes Brother   . Diabetes Brother   . Diabetes Brother   . Healthy Daughter   . Healthy Daughter     Social History   Socioeconomic History  . Marital status: Single    Spouse name: Not on file  . Number of children: Not on file  . Years of education: Not on file  . Highest education level: Not on file  Occupational History  . Not on file  Tobacco Use  . Smoking status: Current Every Day Smoker    Packs/day: 0.25    Types: Cigarettes  . Smokeless tobacco: Never Used  Vaping Use  . Vaping Use: Former  Substance and Sexual Activity  . Alcohol use: No  . Drug use: No  . Sexual activity: Yes    Birth control/protection: None  Other Topics Concern  . Not on file  Social History Narrative   Right handed    Lives with daughter   Caffeine use: Drinks coffee/tea/soda sometimes   Social Determinants of  Health   Financial Resource Strain: Not on file  Food Insecurity: Not on file  Transportation Needs: Not on file  Physical Activity: Not on file  Stress: Not on file  Social Connections: Not on file  Intimate Partner Violence: Not on file    Outpatient Medications Prior to Visit  Medication Sig Dispense Refill  . Accu-Chek Softclix Lancets lancets Use as instructed 100 each 12  . acetaminophen-codeine (TYLENOL #3) 300-30 MG tablet Take 1 tablet by mouth every 12 (twelve) hours as needed for moderate pain. 40 tablet 0  . aspirin EC 81 MG tablet Take 81 mg by mouth every 6 (six) hours as needed for mild pain.     . blood glucose meter kit and supplies Dispense based on patient and insurance preference. Use up to four times daily as directed. (FOR ICD-10 E10.9, E11.9). 1 each 0  . DULoxetine (CYMBALTA) 20 MG capsule Take 1 capsule (20 mg total) by mouth 2 (two) times daily. 180 capsule 1  . EASY COMFORT PEN NEEDLES 31G X 5 MM MISC USE AS INSTRUCTED. TWICE DAILY. 100 each 1  . fluticasone (FLONASE) 50 MCG/ACT nasal spray Place 1 spray into both nostrils daily. 15 mL 2  . fluticasone (FLOVENT HFA) 44 MCG/ACT inhaler Inhale 2 puffs into the lungs 2 (two) times daily. 1 Inhaler  12  . gabapentin (NEURONTIN) 300 MG capsule TAKE 1 CAPSULE (300 MG TOTAL) BY MOUTH 3 (THREE) TIMES DAILY. 90 capsule 1  . glimepiride (AMARYL) 4 MG tablet Take 1 tablet (4 mg total) by mouth daily with breakfast. 90 tablet 1  . glucose blood (ACCU-CHEK AVIVA PLUS) test strip Use as instructed 100 each 12  . Lancets (ACCU-CHEK SOFT TOUCH) lancets Use as instructed 100 each 12  . LEVEMIR FLEXTOUCH 100 UNIT/ML FlexPen INJECT 50 UNITS INTO THE SKIN 2 (TWO) TIMES DAILY. 30 mL 0  . metFORMIN (GLUCOPHAGE) 1000 MG tablet TAKE 1 TABLET BY MOUTH 2 TIMES DAILY WITH A MEAL. 180 tablet 0  . methocarbamol (ROBAXIN) 500 MG tablet Take 1 tablet (500 mg total) by mouth every 8 (eight) hours as needed for muscle spasms. 90 tablet 1  .  NOVOLOG FLEXPEN 100 UNIT/ML FlexPen For blood sugars 0-150 give 0 units of insulin, 200-250 give 2 units of insulin, 251-300 give 4 units, 300-351 give 6 units, 351-400 give 8 units, 401-450 give 10 units,> 451 give 12 units and call M.D. Discussed hypoglycemia protocol. 15 mL 3  . pantoprazole (PROTONIX) 40 MG tablet TAKE 1 TABLET (40 MG TOTAL) BY MOUTH DAILY. 30 tablet 3  . pravastatin (PRAVACHOL) 10 MG tablet Take 1 tablet (10 mg total) by mouth daily. 90 tablet 1  . PROVENTIL HFA 108 (90 Base) MCG/ACT inhaler INHALE 1 TO 2 PUFFS BY MOUTH EVERY 6 (SIX) HOURS AS NEEDED FOR WHEEZING OR SHORTNESS OF BREATH. (Patient taking differently: Inhale 1-2 puffs into the lungs every 6 (six) hours as needed for wheezing or shortness of breath.) 6.7 g 1  . celecoxib (CELEBREX) 200 MG capsule TAKE 1 CAPSULE BY MOUTH 2 TIMES DAILY. 180 capsule 0  . losartan (COZAAR) 25 MG tablet Take 1 tablet (25 mg total) by mouth daily. 90 tablet 1  . benzonatate (TESSALON) 100 MG capsule Take 1 capsule (100 mg total) by mouth every 8 (eight) hours. (Patient not taking: Reported on 05/06/2020) 21 capsule 0  . promethazine-dextromethorphan (PROMETHAZINE-DM) 6.25-15 MG/5ML syrup Take 5 mLs by mouth 4 (four) times daily as needed for cough. (Patient not taking: Reported on 05/06/2020) 118 mL 0   No facility-administered medications prior to visit.    Allergies  Allergen Reactions  . Lisinopril     angioedema    ROS Review of Systems  Endocrine: Positive for polydipsia, polyphagia and polyuria.  Musculoskeletal: Positive for back pain and gait problem.  All other systems reviewed and are negative.     Objective:    Physical Exam Vitals reviewed.  Constitutional:      Appearance: She is obese.  HENT:     Head: Normocephalic.     Right Ear: Tympanic membrane and external ear normal.     Left Ear: Tympanic membrane and external ear normal.     Nose: Nose normal.  Eyes:     Extraocular Movements: Extraocular  movements intact.     Pupils: Pupils are equal, round, and reactive to light.  Cardiovascular:     Rate and Rhythm: Normal rate and regular rhythm.  Pulmonary:     Effort: Pulmonary effort is normal.     Breath sounds: Normal breath sounds.  Abdominal:     General: Bowel sounds are normal.     Palpations: Abdomen is soft.  Musculoskeletal:        General: Normal range of motion.     Cervical back: Normal range of motion and neck supple.  Skin:  General: Skin is warm and dry.  Neurological:     Mental Status: She is alert and oriented to person, place, and time.     BP (!) 156/99 (BP Location: Right Arm, Patient Position: Sitting, Cuff Size: Large)   Pulse 77   Temp 98.1 F (36.7 C) (Temporal)   Ht '5\' 5"'  (1.651 m)   Wt 251 lb 9.6 oz (114.1 kg)   SpO2 95%   BMI 41.87 kg/m  Wt Readings from Last 3 Encounters:  05/06/20 251 lb 9.6 oz (114.1 kg)  04/01/20 254 lb 8 oz (115.4 kg)  03/24/20 251 lb 12.8 oz (114.2 kg)     Health Maintenance Due  Topic Date Due  . PNEUMOCOCCAL POLYSACCHARIDE VACCINE AGE 49-64 HIGH RISK  Never done  . COLONOSCOPY (Pts 45-44yr Insurance coverage will need to be confirmed)  Never done  . OPHTHALMOLOGY EXAM  10/20/2018  . COVID-19 Vaccine (2 - Moderna 3-dose series) 12/23/2019    There are no preventive care reminders to display for this patient.  Lab Results  Component Value Date   TSH 2.870 10/02/2018   Lab Results  Component Value Date   WBC 11.3 (H) 04/01/2020   HGB 14.1 04/01/2020   HCT 42.3 04/01/2020   MCV 75.4 (L) 04/01/2020   PLT 251 04/01/2020   Lab Results  Component Value Date   NA 143 12/07/2019   K 3.4 (L) 12/07/2019   CO2 25 12/07/2019   GLUCOSE 127 (H) 12/07/2019   BUN 10 12/07/2019   CREATININE 0.72 12/07/2019   BILITOT 0.4 12/07/2019   ALKPHOS 133 (H) 12/07/2019   AST 26 12/07/2019   ALT 47 (H) 12/07/2019   PROT 7.4 12/07/2019   ALBUMIN 3.8 12/07/2019   CALCIUM 9.0 12/07/2019   ANIONGAP 10 12/07/2019    Lab Results  Component Value Date   CHOL 185 08/01/2018   Lab Results  Component Value Date   HDL 40 08/01/2018   Lab Results  Component Value Date   LDLCALC 125 (H) 08/01/2018   Lab Results  Component Value Date   TRIG 102 08/01/2018   Lab Results  Component Value Date   CHOLHDL 4.6 (H) 08/01/2018   Lab Results  Component Value Date   HGBA1C 11.5 (A) 05/06/2020      Assessment & Plan:  Diagnoses and all orders for this visit:  Type 2 diabetes mellitus without complication, without long-term current use of insulin (HCC) [facility] . Therapeutic goals for glycemic control related to A1c  Goal of therapy: Less than 6.5 hemoglobin A1c.  Foods that are high in carbohydrates are the following rice, potatoes, breads, sugars, and pastas.  Reduction in the intake (eating) will assist in lowering your blood sugars. -     HgB A1c 11.5Abnormal  8.6Abnormal previously    -     Glucose (CBG)  Essential hypertension Counseled on blood pressure goal of less than 130/80, low-sodium, DASH diet, medication compliance, 150 minutes of moderate intensity exercise per week. Discussed medication compliance, adverse effects. Increased Cozaar from 267mto 5071maily added amlodipine 56m37mily -     losartan (COZAAR) 50 MG tablet; Take 1 tablet (50 mg total) by mouth daily.             amLODipine (NORVASC) 10 MG tablet Take 1 tablet by mouth daily.  Chronic pain syndrome -     ibuprofen (ADVIL) 800 MG tablet; Take 1 tablet (800 mg total) by mouth every 8 (  eight) hours as needed for headache.    Follow-up: Return in about 6 weeks (around 06/17/2020) for [facility] DM 6 weeks Bp check.    Kerin Perna, NP

## 2020-05-06 NOTE — Patient Instructions (Signed)
Diabetes Mellitus Action Plan Following a diabetes action plan is a way for you to manage your diabetes (diabetes mellitus) symptoms. The plan is color-coded to help you understand what actions you need to take based on any symptoms you are having.  If you have symptoms in the red zone, you need medical care right away.  If you have symptoms in the yellow zone, you are having problems.  If you have symptoms in the green zone, you are doing well. Learning about and understanding diabetes can take time. Follow the plan that you develop with your health care provider. Know the target range for your blood sugar (glucose) level, and review your treatment plan with your health care provider at each visit. The target range for my blood sugar level is __________________________ mg/dL. Red zone Get medical help right away if you have any of the following symptoms:  A blood sugar test result that is below 54 mg/dL (3 mmol/L).  A blood sugar test result that is at or above 240 mg/dL (13.3 mmol/L) for 2 days in a row.  Confusion or trouble thinking clearly.  Difficulty breathing.  Sickness or a fever for 2 or more days that is not getting better.  Moderate or large ketone levels in your urine.  Feeling tired or having no energy. If you have any red zone symptoms, do not wait to see if the symptoms will go away. Get medical help right away. Call your local emergency services (911 in the [place] Do not drive yourself to the hospital. If you have severely low blood sugar (severe hypoglycemia) and you cannot eat or drink, you may need glucagon. Make sure a family member or close friend knows how to check your blood sugar and how to give you glucagon. You may need to be treated in a hospital for this condition.   Yellow zone If you have any of the following symptoms, your diabetes is not under control and you may need to make some changes:  A blood sugar test result that is at or above 240 mg/dL (13.3  mmol/L) for 2 days in a row.  Blood sugar test results that are below 70 mg/dL (3.9 mmol/L).  Other symptoms of hypoglycemia, such as: ? Shaking or feeling light-headed. ? Confusion or irritability. ? Feeling hungry. ? Having a fast heartbeat. If you have any yellow zone symptoms:  Treat your hypoglycemia by eating or drinking 15 grams of a rapid-acting carbohydrate. Follow the 15:15 rule: ? Take 15 grams of a rapid-acting carbohydrate, such as:  1 tube of glucose gel.  4 glucose pills.  4 oz (120 mL) of fruit juice.  4 oz (120 mL) of regular (not diet) soda. ? Check your blood sugar 15 minutes after you take the carbohydrate. ? If the repeat blood sugar test is still at or below 70 mg/dL (3.9 mmol/L), take 15 grams of a carbohydrate again. ? If your blood sugar does not increase above 70 mg/dL (3.9 mmol/L) after 3 tries, get medical help right away. ? After your blood sugar returns to normal, eat a meal or a snack within 1 hour.  Keep taking your daily medicines as told by your health care provider.  Check your blood sugar more often than you normally would. ? Write down your results. ? Call your health care provider if you have trouble keeping your blood sugar in your target range.   Green zone These signs mean you are doing well and you can continue what you   are doing to manage your diabetes:  Your blood sugar is within your personal target range. For most people, a blood sugar level before a meal (preprandial) should be 80-130 mg/dL (4.4-7.2 mmol/L).  You feel well, and you are able to do daily activities. If you are in the green zone, continue to manage your diabetes as told by your health care provider. To do this:  Eat a healthy diet.  Exercise regularly.  Check your blood sugar as told by your health care provider.  Take your medicines as told by your health care provider.   Where to find more information  American Diabetes Association (ADA):  diabetes.org  Association of Diabetes Care & Education Specialists (ADCES): diabeteseducator.org Summary  Following a diabetes action plan is a way for you to manage your diabetes symptoms. The plan is color-coded to help you understand what actions you need to take based on any symptoms you are having.  Follow the plan that you develop with your health care provider. Make sure you know your personal target blood sugar level.  Review your treatment plan with your health care provider at each visit. This information is not intended to replace advice given to you by your health care provider. Make sure you discuss any questions you have with your health care provider. Document Revised: 10/02/2019 Document Reviewed: 10/02/2019 Elsevier Patient Education  2021 Elsevier Inc.  

## 2020-05-13 ENCOUNTER — Other Ambulatory Visit: Payer: Self-pay

## 2020-05-13 ENCOUNTER — Encounter (HOSPITAL_COMMUNITY): Payer: Self-pay | Admitting: Psychiatry

## 2020-05-13 ENCOUNTER — Ambulatory Visit (HOSPITAL_COMMUNITY): Payer: Medicaid Other | Admitting: Physician Assistant

## 2020-05-13 ENCOUNTER — Telehealth (INDEPENDENT_AMBULATORY_CARE_PROVIDER_SITE_OTHER): Payer: Medicaid Other | Admitting: Psychiatry

## 2020-05-13 DIAGNOSIS — F4329 Adjustment disorder with other symptoms: Secondary | ICD-10-CM | POA: Diagnosis not present

## 2020-05-13 DIAGNOSIS — F331 Major depressive disorder, recurrent, moderate: Secondary | ICD-10-CM | POA: Diagnosis not present

## 2020-05-13 DIAGNOSIS — F4381 Prolonged grief disorder: Secondary | ICD-10-CM

## 2020-05-13 DIAGNOSIS — F3340 Major depressive disorder, recurrent, in remission, unspecified: Secondary | ICD-10-CM | POA: Insufficient documentation

## 2020-05-13 MED ORDER — TRAZODONE HCL 50 MG PO TABS: 50.0000 mg | ORAL_TABLET | Freq: Every evening | ORAL | 0 refills | Status: DC | PRN

## 2020-05-13 MED ORDER — DULOXETINE HCL 60 MG PO CPEP: 60.0000 mg | ORAL_CAPSULE | Freq: Every day | ORAL | 0 refills | Status: DC

## 2020-05-13 NOTE — Progress Notes (Signed)
Psychiatric Initial Adult Assessment   Virtual Visit via Video Note  I connected with [name] on 05/13/20 at  8:20 AM EST by a video enabled telemedicine application and verified that I am speaking with the correct person using two identifiers.  Location: Patient: Home Provider: Clinic   I discussed the limitations of evaluation and management by telemedicine and the availability of in person appointments. The patient expressed understanding and agreed to proceed.  I provided  minutes of non-face-to-face time during this encounter.     Patient Identification: [name] MRN:  [id number] Date of Evaluation:  05/13/2020   Referral Source: Ms. Oletta Lamas, NP, [facility]  Chief Complaint:   " I stay depressed all the time."  Visit Diagnosis:    ICD-10-CM   1. MDD (major depressive disorder), recurrent episode, moderate (HCC)  F33.1 DULoxetine (CYMBALTA) 60 MG capsule    traZODone (DESYREL) 50 MG tablet  2. 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 DULoxetine (CYMBALTA) 60 MG capsule    History of Present Illness: This is a 50 year old female with history of MDD, grief, type 2 diabetes mellitus, hypertension, migraine, rheumatoid arthritis, chronic bilateral knee osteoarthritis now seen for evaluation after being referred by her PCP. Patient reported that she stays depressed since her son passed away in 2013/06/16 after suffering from a gunshot injury.  She stated that her 53 year old son was killed unexpectedly and ever since then patient has been very depressed and misses her son. She stated that for the past couple of years her mood is depressed and she feels sad and tearful all the time.  She has no energy to do anything.  She stays in bed all the time or sits in the dark.  She does not want to be bothered by anyone and does not go out much. Her sleep is poor, she stated that she will go to bed around midnight and will wake up around 5 AM. She reported that  she barely eats and her appetite is really poor.  Her blood sugars fluctuate frequently. She stated that she sometimes wishes that she will be better off dead however has never tried to hurt herself.  Denied any prior suicide attempts. She reported that she cannot focus on anything and always thinks about her deceased son. She reported that sometimes she hears her deceased son's voice telling her that it will be all right.  She watches her windows all the time as she worries that somebody might just breaking and shoot her like her son was shot.  She lives by herself however her daughter and her niece try to spend time with her during the daytime.  She informed that her niece usually leaves her house by midnight and that is when patient will try to go to sleep but then wakes up around 5 AM.  Regarding her financial status, patient reported that she is applying for disability.  She does have family members who are helping her with bills.  And some bills are helped by social services.  As per EMR, patient has been tried on several different antidepressants in the past, Celexa, Lexapro.  Patient is still dealing with chronic pain.  Writer asked her if her pain is any better, she reported that she used to take tramadol in the past which helps some and then recently her PCP is provided her with ibuprofen prescription.  As per EMR patient has received intermittent prescriptions for hydrocodone in the past.  She informed that her  orthopedic provider had prescribed her with Tylenol with codeine which helped her.  Patient is currently taking Cymbalta 20 mg twice a day for depression symptoms as well as for chronic pain. Writer advised her to adjust the medicine dose to 60 mg once a day and see if that helps her better.  The plan is to adjust the dose by going up further if needed at the time of her next assessment.  Patient verbalized agreement to try higher dose. Patient also requested something to help her with  sleep, trazodone was recommended. Potential side effects of medication and risks vs benefits of treatment vs non-treatment were explained and discussed. All questions were answered.  Patient denied any history of hypomanic or manic symptoms at present or in the past.  Other than intermittently hearing her deceased son's voices she denied any other auditory or visual hallucinations.  She denied any paranoid delusions. She denied any homicidal ideations.  Past Psychiatric History: MDD, grief with prolonged bereavement reaction  Previous Psychotropic Medications: Yes -Celexa, Lexapro, Cymbalta  Substance Abuse History in the last 12 months:  No.  Consequences of Substance Abuse: Negative  Past Medical History:  Past Medical History:  Diagnosis Date  . Anxiety   . Common migraine with intractable migraine 10/02/2016  . Depression   . Diabetes mellitus   . Hypertension     Past Surgical History:  Procedure Laterality Date  . CHOLECYSTECTOMY    . TUBAL LIGATION      Family Psychiatric History: Denied any family psychiatric history  Family History:  Family History  Problem Relation Age of Onset  . Diabetes Mother   . Hypertension Mother   . Renal Disease Mother   . Cancer Father   . Diabetes Sister   . Diabetes Brother   . Renal Disease Brother   . Diabetes Brother   . Diabetes Sister   . Diabetes Brother   . Diabetes Brother   . Diabetes Brother   . Healthy Daughter   . Healthy Daughter     Social History:   Social History   Socioeconomic History  . Marital status: Single    Spouse name: Not on file  . Number of children: Not on file  . Years of education: Not on file  . Highest education level: Not on file  Occupational History  . Not on file  Tobacco Use  . Smoking status: Current Every Day Smoker    Packs/day: 0.25    Types: Cigarettes  . Smokeless tobacco: Never Used  Vaping Use  . Vaping Use: Former  Substance and Sexual Activity  . Alcohol use: No   . Drug use: No  . Sexual activity: Yes    Birth control/protection: None  Other Topics Concern  . Not on file  Social History Narrative   Right handed    Lives with daughter   Caffeine use: Drinks coffee/tea/soda sometimes   Social Determinants of Health   Financial Resource Strain: Not on file  Food Insecurity: Not on file  Transportation Needs: Not on file  Physical Activity: Not on file  Stress: Not on file  Social Connections: Not on file    Additional Social History: Lives by herself, family helps with bills, has applied for disability.  Allergies:   Allergies  Allergen Reactions  . Lisinopril     angioedema    Metabolic Disorder Labs: Lab Results  Component Value Date   HGBA1C 11.5 (A) 05/06/2020   No results found for: PROLACTIN Lab Results  Component Value Date   CHOL 185 08/01/2018   TRIG 102 08/01/2018   HDL 40 08/01/2018   CHOLHDL 4.6 (H) 08/01/2018   LDLCALC 125 (H) 08/01/2018   LDLCALC 98 08/17/2016   Lab Results  Component Value Date   TSH 2.870 10/02/2018    Therapeutic Level Labs: No results found for: LITHIUM No results found for: CBMZ No results found for: VALPROATE  Current Medications: Current Outpatient Medications  Medication Sig Dispense Refill  . DULoxetine (CYMBALTA) 60 MG capsule Take 1 capsule (60 mg total) by mouth daily. 90 capsule 0  . traZODone (DESYREL) 50 MG tablet Take 1 tablet (50 mg total) by mouth at bedtime as needed for sleep. 90 tablet 0  . Accu-Chek Softclix Lancets lancets Use as instructed 100 each 12  . acetaminophen-codeine (TYLENOL #3) 300-30 MG tablet Take 1 tablet by mouth every 12 (twelve) hours as needed for moderate pain. 40 tablet 0  . amLODipine (NORVASC) 10 MG tablet Take 1 tablet (10 mg total) by mouth daily. 90 tablet 3  . aspirin EC 81 MG tablet Take 81 mg by mouth every 6 (six) hours as needed for mild pain.     . benzonatate (TESSALON) 100 MG capsule Take 1 capsule (100 mg total) by mouth every  8 (eight) hours. (Patient not taking: Reported on 05/06/2020) 21 capsule 0  . blood glucose meter kit and supplies Dispense based on patient and insurance preference. Use up to four times daily as directed. (FOR ICD-10 E10.9, E11.9). 1 each 0  . EASY COMFORT PEN NEEDLES 31G X 5 MM MISC USE AS INSTRUCTED. TWICE DAILY. 100 each 1  . fluticasone (FLONASE) 50 MCG/ACT nasal spray Place 1 spray into both nostrils daily. 15 mL 2  . fluticasone (FLOVENT HFA) 44 MCG/ACT inhaler Inhale 2 puffs into the lungs 2 (two) times daily. 1 Inhaler 12  . gabapentin (NEURONTIN) 300 MG capsule TAKE 1 CAPSULE (300 MG TOTAL) BY MOUTH 3 (THREE) TIMES DAILY. 90 capsule 1  . glimepiride (AMARYL) 4 MG tablet Take 1 tablet (4 mg total) by mouth daily with breakfast. 90 tablet 1  . glucose blood (ACCU-CHEK AVIVA PLUS) test strip Use as instructed 100 each 12  . ibuprofen (ADVIL) 800 MG tablet Take 1 tablet (800 mg total) by mouth every 8 (eight) hours as needed for headache. 90 tablet 1  . Lancets (ACCU-CHEK SOFT TOUCH) lancets Use as instructed 100 each 12  . LEVEMIR FLEXTOUCH 100 UNIT/ML FlexPen INJECT 50 UNITS INTO THE SKIN 2 (TWO) TIMES DAILY. 30 mL 0  . losartan (COZAAR) 50 MG tablet Take 1 tablet (50 mg total) by mouth daily. 90 tablet 1  . metFORMIN (GLUCOPHAGE) 1000 MG tablet TAKE 1 TABLET BY MOUTH 2 TIMES DAILY WITH A MEAL. 180 tablet 0  . methocarbamol (ROBAXIN) 500 MG tablet Take 1 tablet (500 mg total) by mouth every 8 (eight) hours as needed for muscle spasms. 90 tablet 1  . NOVOLOG FLEXPEN 100 UNIT/ML FlexPen For blood sugars 0-150 give 0 units of insulin, 200-250 give 2 units of insulin, 251-300 give 4 units, 300-351 give 6 units, 351-400 give 8 units, 401-450 give 10 units,> 451 give 12 units and call M.D. Discussed hypoglycemia protocol. 15 mL 3  . pantoprazole (PROTONIX) 40 MG tablet TAKE 1 TABLET (40 MG TOTAL) BY MOUTH DAILY. 30 tablet 3  . pravastatin (PRAVACHOL) 10 MG tablet Take 1 tablet (10 mg total) by  mouth daily. 90 tablet 1  . promethazine-dextromethorphan (PROMETHAZINE-DM) 6.25-15 MG/5ML  syrup Take 5 mLs by mouth 4 (four) times daily as needed for cough. (Patient not taking: Reported on 05/06/2020) 118 mL 0  . PROVENTIL HFA 108 (90 Base) MCG/ACT inhaler INHALE 1 TO 2 PUFFS BY MOUTH EVERY 6 (SIX) HOURS AS NEEDED FOR WHEEZING OR SHORTNESS OF BREATH. (Patient taking differently: Inhale 1-2 puffs into the lungs every 6 (six) hours as needed for wheezing or shortness of breath.) 6.7 g 1   No current facility-administered medications for this visit.     Psychiatric Specialty Exam: Review of Systems  There were no vitals taken for this visit.There is no height or weight on file to calculate BMI.  General Appearance: Disheveled  Eye Contact:  Good  Speech:  Clear and Coherent and Slow  Volume:  Normal  Mood:  Depressed and Dysphoric  Affect:  Congruent  Thought Process:  Goal Directed and Descriptions of Associations: Intact  Orientation:  Full (Time, Place, and Person)  Thought Content:  Logical  Suicidal Thoughts:  No  Homicidal Thoughts:  No  Memory:  Immediate;   Good Recent;   Good Remote;   Good  Judgement:  Fair  Insight:  Fair  Psychomotor Activity:  Decreased  Concentration:  Concentration: Good and Attention Span: Good  Recall:  Good  Fund of Knowledge:Good  Language: Good  Akathisia:  Negative  Handed:  Right  AIMS (if indicated):  not done  Assets:  Communication Skills Desire for Improvement Financial Resources/Insurance Housing Social Support  ADL's:  Intact  Cognition: WNL  Sleep:  Poor   Screenings: GAD-7   Flowsheet Row Office Visit from 05/06/2020 in [place] Office Visit from 03/09/2020 in [place] Office Visit from 01/05/2020 in [place] Office Visit from 10/01/2019 in [place] Office Visit from 06/23/2019 in [place]  Total  GAD-7 Score '7 18 13 9 ' 0    PHQ2-9   Flowsheet Row Video Visit from 05/13/2020 in [facility] Office Visit from 05/06/2020 in [place] Office Visit from 03/09/2020 in [place] Office Visit from 01/05/2020 in [place] Office Visit from 10/01/2019 in [place]  PHQ-2 Total Score '6 2 6 2 2  ' PHQ-9 Total Score '22 8 17 13 4      ' Assessment and Plan: Based on patient's history and evaluation she meets criteria for MDD, grief reaction with prolonged bereavement.  Patient still misses her deceased son who was unfortunately killed back in 2015.  Will adjust her dose of Cymbalta to 60 mg daily and will start trazodone to address poor sleep at night. Potential side effects of medication and risks vs benefits of treatment vs non-treatment were explained and discussed. All questions were answered.   1. MDD (major depressive disorder), recurrent episode, moderate (HCC)  - Increase DULoxetine (CYMBALTA) 60 MG capsule; Take 1 capsule (60 mg total) by mouth daily.  Dispense: 90 capsule; Refill: 0 - Start traZODone (DESYREL) 50 MG tablet; Take 1 tablet (50 mg total) by mouth at bedtime as needed for sleep.  Dispense: 90 tablet; Refill: 0  2. Grief reaction with prolonged bereavement  - DULoxetine (CYMBALTA) 60 MG capsule; Take 1 capsule (60 mg total) by mouth daily.  Dispense: 90 capsule; Refill: 0  F/up in 2 months.  Nevada Crane, MD 2/3/20228:52 AM

## 2020-05-14 ENCOUNTER — Other Ambulatory Visit: Payer: Self-pay

## 2020-05-14 ENCOUNTER — Ambulatory Visit: Payer: Medicaid Other | Attending: Primary Care | Admitting: Pharmacist

## 2020-05-14 ENCOUNTER — Encounter: Payer: Self-pay | Admitting: Pharmacist

## 2020-05-14 DIAGNOSIS — E119 Type 2 diabetes mellitus without complications: Secondary | ICD-10-CM

## 2020-05-14 DIAGNOSIS — Z794 Long term (current) use of insulin: Secondary | ICD-10-CM | POA: Diagnosis not present

## 2020-05-14 LAB — GLUCOSE, POCT (MANUAL RESULT ENTRY): POC Glucose: 200 mg/dl — AB (ref 70–99)

## 2020-05-14 MED ORDER — TRULICITY 0.75 MG/0.5ML ~~LOC~~ SOAJ: 0.7500 mg | SUBCUTANEOUS | 0 refills | Status: DC

## 2020-05-14 NOTE — Patient Instructions (Signed)
Thank you for coming to see me today. Please do the following:  1. Start Trulicity. You take 1 injection once a week on Sundays.  2. Continue Levemir 50u in the morning and 50u in the evening. 3. Continue to take 1 tablet of metformin in the morning and 1 tablet of metformin in the evening.   4. Stop glimepiride.  5. Stop novolog.   6. Continue checking blood sugars at home.  7. Continue making the lifestyle changes we've discussed together during our visit. Diet and exercise play a significant role in improving your blood sugars.  8. Follow-up with me in 4 weeks.    Hypoglycemia or low blood sugar:   Low blood sugar can happen quickly and may become an emergency if not treated right away.   While this shouldn't happen often, it can be brought upon if you skip a meal or do not eat enough. Also, if your insulin or other diabetes medications are dosed too high, this can cause your blood sugar to go to low.   Warning signs of low blood sugar include: 1. Feeling shaky or dizzy 2. Feeling weak or tired  3. Excessive hunger 4. Feeling anxious or upset  5. Sweating even when you aren't exercising  What to do if I experience low blood sugar? 1. Check your blood sugar with your meter. If lower than 70, proceed to step 2.  2. Treat with 3-4 glucose tablets or 3 packets of regular sugar. If these aren't around, you can try hard candy. Yet another option would be to drink 4 ounces of fruit juice or 6 ounces of REGULAR soda.  3. Re-check your sugar in 15 minutes. If it is still below 70, do what you did in step 2 again. If has come back up, go ahead and eat a snack or small meal at this time.

## 2020-05-14 NOTE — Progress Notes (Signed)
    S:    PCP: Marcelino Duster  No chief complaint on file.  Patient arrives in good spirits.  Presents for diabetes evaluation, education, and management. Patient was referred and last seen by Primary Care Provider on 05/06/2020.   Patient reports Diabetes was diagnosed >10 yrs ago. Pt denies personal or fhx thyroid cancer. Pt denies hx of pancreatitis. No personal hx of MI, CHF, stroke, or CKD.   Family/Social History:  FHx: DM, HTN, renal disease  Tobacco: current 0.5 PPD smoker Alcohol: denies use   Insurance coverage/medication affordability: Medicaid  Medication adherence reported.   Current diabetes medications include: Levemir 50u BID, metformin 1000 mg BID Current hypertension medications include: amlodipine 10 mg daily  Current hyperlipidemia medications include: pravastatin 10 mg daily   Patient endorses hypoglycemic events. Endorses blood sugars <70. Gives 1 reading of 58. Treated successfully.   Patient reported dietary habits:  - Pt admits to dietary indiscretion   Patient-reported exercise habits:  - limited d/t lower leg pain   Patient reports polyuria. Patient reports neuropathy (nerve pain). Patient reports visual changes. Patient reports self foot exams.     O:  POCT: 200  Lab Results  Component Value Date   HGBA1C 11.5 (A) 05/06/2020   There were no vitals filed for this visit.  Lipid Panel     Component Value Date/Time   CHOL 185 08/01/2018 1100   TRIG 102 08/01/2018 1100   HDL 40 08/01/2018 1100   CHOLHDL 4.6 (H) 08/01/2018 1100   LDLCALC 125 (H) 08/01/2018 1100   Home fasting blood sugars: 90s - 200s  2 hour post-meal/random blood sugars: 200-400s.  Clinical Atherosclerotic Cardiovascular Disease (ASCVD): No  The 10-year ASCVD risk score Denman George DC Jr., et al., 2013) is: 43.9%   Values used to calculate the score:     Age: 50 years     Sex: Female     Is Non-Hispanic African American: Yes     Diabetic: Yes     Tobacco smoker: Yes      Systolic Blood Pressure: 156 mmHg     Is BP treated: Yes     HDL Cholesterol: 40 mg/dL     Total Cholesterol: 185 mg/dL    A/P: Diabetes longstanding currently uncontrolled. Patient is able to verbalize appropriate hypoglycemia management plan. Medication adherence appears appropriate. Control is suboptimal due to dietary indiscretion, physical inactivity. -Discontinued glimepiride, Novolog. -Continue Levemir 50u BID. -Continue metformin 1000 mg BID.  -Start Trulicity 0.75 mg weekly.  -Extensively discussed pathophysiology of diabetes, recommended lifestyle interventions, dietary effects on blood sugar control -Counseled on s/sx of and management of hypoglycemia -Next A1C anticipated 07/2020.   Written patient instructions provided.  Total time in face to face counseling 30 minutes.   Follow up Pharmacist Clinic Visit in 1 month.    Butch Penny, PharmD, P[place]y [place], CPP Clinical Pharmacist [facility] [phone]

## 2020-05-21 ENCOUNTER — Telehealth: Payer: Self-pay | Admitting: Primary Care

## 2020-05-21 ENCOUNTER — Other Ambulatory Visit: Payer: Self-pay | Admitting: Pharmacist

## 2020-05-21 MED ORDER — INSULIN PEN NEEDLE 31G X 8 MM MISC: 2 refills | Status: DC

## 2020-05-21 NOTE — Progress Notes (Signed)
Pt called to request longer pen needles for her Levemir pen. Rx sent.

## 2020-05-21 NOTE — Telephone Encounter (Signed)
Copied from CRM [id number]. Topic: General - Other >> May 21, 2020  9:48 AM Tamela Oddi wrote: Reason for CRM: Patient called to speak with Franky Macho regarding a new med that she said he started her with.  Patient feels it is not working.  Please call to discuss at [phone]

## 2020-05-24 ENCOUNTER — Ambulatory Visit: Payer: Medicaid Other | Admitting: Orthopedic Surgery

## 2020-05-26 ENCOUNTER — Telehealth (INDEPENDENT_AMBULATORY_CARE_PROVIDER_SITE_OTHER): Payer: Self-pay | Admitting: Primary Care

## 2020-05-26 DIAGNOSIS — E119 Type 2 diabetes mellitus without complications: Secondary | ICD-10-CM

## 2020-05-26 NOTE — Telephone Encounter (Signed)
Sent refill request to PCP

## 2020-05-28 NOTE — Telephone Encounter (Signed)
Patient is requesting a letter to return to work. She has been released from ortho. Patient needs letter to state that she was under care from [facility] 1- feb 14. Maryjean Morn, CMA   Copied from CRM [id number]. Topic: General - Other >> May 26, 2020 12:45 PM Jaquita Rector A wrote: Reason for CRM: Patient called in requesting to speak to Gwinda Passe or the nurse say its a personal important matter, please call Ph# [phone]

## 2020-06-03 NOTE — Telephone Encounter (Signed)
Pt is calling checking on the status of letter to return to work. Pt needs the letter by tomorrow to take to work

## 2020-06-03 NOTE — Telephone Encounter (Signed)
Sent to PCP ?

## 2020-06-04 ENCOUNTER — Encounter (INDEPENDENT_AMBULATORY_CARE_PROVIDER_SITE_OTHER): Payer: Self-pay | Admitting: Primary Care

## 2020-06-07 ENCOUNTER — Ambulatory Visit: Payer: Medicaid Other | Admitting: Orthopedic Surgery

## 2020-06-11 ENCOUNTER — Ambulatory Visit: Payer: Medicaid Other | Attending: Family Medicine | Admitting: Pharmacist

## 2020-06-11 ENCOUNTER — Encounter: Payer: Self-pay | Admitting: Pharmacist

## 2020-06-11 ENCOUNTER — Other Ambulatory Visit: Payer: Self-pay

## 2020-06-11 DIAGNOSIS — Z794 Long term (current) use of insulin: Secondary | ICD-10-CM

## 2020-06-11 DIAGNOSIS — E119 Type 2 diabetes mellitus without complications: Secondary | ICD-10-CM

## 2020-06-11 LAB — GLUCOSE, POCT (MANUAL RESULT ENTRY): POC Glucose: 300 mg/dl — AB (ref 70–99)

## 2020-06-11 MED ORDER — TRULICITY 1.5 MG/0.5ML ~~LOC~~ SOAJ: 1.5000 mg | SUBCUTANEOUS | 2 refills | Status: DC

## 2020-06-11 NOTE — Progress Notes (Signed)
    S:    PCP: Marcelino Duster  No chief complaint on file.  Patient arrives in good spirits.  Presents for diabetes evaluation, education, and management. Patient was referred and last seen by Primary Care Provider on 05/06/2020. I saw her on 05/14/20 and started Trulicity. We stopped her glimepiride and insulin aspart d/t hypoglycemia.    Family/Social History:  FHx: DM, HTN, renal disease  Tobacco: current 0.5 PPD smoker Alcohol: denies use   Insurance coverage/medication affordability: Medicaid  Medication adherence reported.   Current diabetes medications include: Levemir 50u BID, metformin 1000 mg BID, Trulicity 0.75 mg weekly  Current hypertension medications include: amlodipine 10 mg daily  Current hyperlipidemia medications include: pravastatin 10 mg daily   Patient denies hypoglycemic events.   Patient reported dietary habits:  - Pt admits to dietary indiscretion   Patient-reported exercise habits:  - limited d/t lower leg pain   Patient reports polyuria. Patient reports neuropathy (nerve pain). Patient reports visual changes. Patient reports self foot exams.     O:  POCT: 300  Lab Results  Component Value Date   HGBA1C 11.5 (A) 05/06/2020   There were no vitals filed for this visit.  Lipid Panel     Component Value Date/Time   CHOL 185 08/01/2018 1100   TRIG 102 08/01/2018 1100   HDL 40 08/01/2018 1100   CHOLHDL 4.6 (H) 08/01/2018 1100   LDLCALC 125 (H) 08/01/2018 1100   Home fasting blood sugars: reports 110s - 120s 2 hour post-meal/random blood sugars: reports mid 100s. Denies readings > 200.   Clinical Atherosclerotic Cardiovascular Disease (ASCVD): No  The 10-year ASCVD risk score Denman George DC Jr., et al., 2013) is: 43.9%   Values used to calculate the score:     Age: 50 years     Sex: Female     Is Non-Hispanic African American: Yes     Diabetic: Yes     Tobacco smoker: Yes     Systolic Blood Pressure: 156 mmHg     Is BP treated: Yes     HDL  Cholesterol: 40 mg/dL     Total Cholesterol: 185 mg/dL    A/P: Diabetes longstanding currently uncontrolled. Reported CBGs are at goal, however, her sugar today is elevated. She attributes this to eating chocolate before her appointment today. Patient is able to verbalize appropriate hypoglycemia management plan. Medication adherence appears appropriate. -Continue Levemir 50u BID. -Continue metformin 1000 mg BID.  -Increase Trulicity to 1.5 mg weekly.  -Extensively discussed pathophysiology of diabetes, recommended lifestyle interventions, dietary effects on blood sugar control -Counseled on s/sx of and management of hypoglycemia -Next A1C anticipated 07/2020.   Written patient instructions provided.  Total time in face to face counseling 30 minutes.   Follow up Pharmacist Clinic Visit in 1 month.    Butch Penny, PharmD, Patsy [place], CPP Clinical Pharmacist [facility] [phone]

## 2020-06-14 ENCOUNTER — Telehealth: Payer: Self-pay

## 2020-06-14 NOTE — Telephone Encounter (Signed)
Copied from CRM [id number]. Topic: Referral - Question >> Jun 14, 2020  1:22 PM Pawlus, Maxine Glenn A wrote: Reason for CRM: Pt called in stating that Marcelino Duster was supposed to give her a referral to a psychiatrist. Please advise.

## 2020-06-14 NOTE — Telephone Encounter (Signed)
Patient was referred to psychiatry in 02/2020. May need new referral placed.

## 2020-06-15 ENCOUNTER — Other Ambulatory Visit (INDEPENDENT_AMBULATORY_CARE_PROVIDER_SITE_OTHER): Payer: Self-pay | Admitting: Primary Care

## 2020-06-15 DIAGNOSIS — F331 Major depressive disorder, recurrent, moderate: Secondary | ICD-10-CM

## 2020-06-17 ENCOUNTER — Encounter (INDEPENDENT_AMBULATORY_CARE_PROVIDER_SITE_OTHER): Payer: Self-pay | Admitting: Primary Care

## 2020-06-17 ENCOUNTER — Other Ambulatory Visit: Payer: Self-pay

## 2020-06-17 ENCOUNTER — Ambulatory Visit (INDEPENDENT_AMBULATORY_CARE_PROVIDER_SITE_OTHER): Payer: Medicaid Other | Admitting: Primary Care

## 2020-06-17 DIAGNOSIS — M2550 Pain in unspecified joint: Secondary | ICD-10-CM | POA: Diagnosis not present

## 2020-06-17 DIAGNOSIS — E119 Type 2 diabetes mellitus without complications: Secondary | ICD-10-CM

## 2020-06-17 DIAGNOSIS — F321 Major depressive disorder, single episode, moderate: Secondary | ICD-10-CM

## 2020-06-17 DIAGNOSIS — E1142 Type 2 diabetes mellitus with diabetic polyneuropathy: Secondary | ICD-10-CM

## 2020-06-17 MED ORDER — GABAPENTIN 300 MG PO CAPS: 300.0000 mg | ORAL_CAPSULE | Freq: Three times a day (TID) | ORAL | 1 refills | Status: DC

## 2020-06-17 MED ORDER — LEVEMIR FLEXTOUCH 100 UNIT/ML ~~LOC~~ SOPN: PEN_INJECTOR | SUBCUTANEOUS | 3 refills | Status: DC

## 2020-06-17 MED ORDER — METFORMIN HCL 1000 MG PO TABS: 1000.0000 mg | ORAL_TABLET | Freq: Two times a day (BID) | ORAL | 1 refills | Status: DC

## 2020-06-17 NOTE — Progress Notes (Signed)
Established Patient Office Visit  Subjective:  Patient ID: Labrenda Lasky, female    DOB: 05/22/1970  Age: 50 y.o. MRN: [id number]  CC:  Chief Complaint  Patient presents with  . Blood Pressure Check    HPI [name] is a 50 year old  Obese female who presents for blood pressure follow up- management of HTN. Denies shortness of breath, headaches, chest pain or lower extremity edema, sudden onset, vision changes, unilateral weakness, dizziness, paresthesias.Main complaint and concern is bilateral pain in knees (10/10) interrupting her life- difficulty walking, constant pain causing increase in depression due to declining health followed by behavioral health.   Past Medical History:  Diagnosis Date  . Anxiety   . Common migraine with intractable migraine 10/02/2016  . Depression   . Diabetes mellitus   . Hypertension     Past Surgical History:  Procedure Laterality Date  . CHOLECYSTECTOMY    . TUBAL LIGATION      Family History  Problem Relation Age of Onset  . Diabetes Mother   . Hypertension Mother   . Renal Disease Mother   . Cancer Father   . Diabetes Sister   . Diabetes Brother   . Renal Disease Brother   . Diabetes Brother   . Diabetes Sister   . Diabetes Brother   . Diabetes Brother   . Diabetes Brother   . Healthy Daughter   . Healthy Daughter     Social History   Socioeconomic History  . Marital status: Single    Spouse name: Not on file  . Number of children: Not on file  . Years of education: Not on file  . Highest education level: Not on file  Occupational History  . Not on file  Tobacco Use  . Smoking status: Current Every Day Smoker    Packs/day: 0.25    Types: Cigarettes  . Smokeless tobacco: Never Used  Vaping Use  . Vaping Use: Former  Substance and Sexual Activity  . Alcohol use: No  . Drug use: No  . Sexual activity: Yes    Birth control/protection: None  Other Topics Concern  . Not on file  Social History Narrative    Right handed    Lives with daughter   Caffeine use: Drinks coffee/tea/soda sometimes   Social Determinants of Health   Financial Resource Strain: Not on file  Food Insecurity: Not on file  Transportation Needs: Not on file  Physical Activity: Not on file  Stress: Not on file  Social Connections: Not on file  Intimate Partner Violence: Not on file    Outpatient Medications Prior to Visit  Medication Sig Dispense Refill  . Accu-Chek Softclix Lancets lancets Use as instructed 100 each 12  . amLODipine (NORVASC) 10 MG tablet Take 1 tablet (10 mg total) by mouth daily. 90 tablet 3  . aspirin EC 81 MG tablet Take 81 mg by mouth every 6 (six) hours as needed for mild pain.     . blood glucose meter kit and supplies Dispense based on patient and insurance preference. Use up to four times daily as directed. (FOR ICD-10 E10.9, E11.9). 1 each 0  . celecoxib (CELEBREX) 200 MG capsule Take 200 mg by mouth 2 (two) times daily.    . Dulaglutide (TRULICITY) 1.5 DE/0.8XK SOPN Inject 1.5 mg into the skin once a week. 2 mL 2  . DULoxetine (CYMBALTA) 60 MG capsule Take 1 capsule (60 mg total) by mouth daily. 90 capsule 0  . fluticasone (  FLONASE) 50 MCG/ACT nasal spray Place 1 spray into both nostrils daily. 15 mL 2  . fluticasone (FLOVENT HFA) 44 MCG/ACT inhaler Inhale 2 puffs into the lungs 2 (two) times daily. 1 Inhaler 12  . glucose blood (ACCU-CHEK AVIVA PLUS) test strip Use as instructed 100 each 12  . ibuprofen (ADVIL) 800 MG tablet Take 1 tablet (800 mg total) by mouth every 8 (eight) hours as needed for headache. 90 tablet 1  . Insulin Pen Needle 31G X 8 MM MISC Use to inject Levemir BID. 100 each 2  . Lancets (ACCU-CHEK SOFT TOUCH) lancets Use as instructed 100 each 12  . losartan (COZAAR) 50 MG tablet Take 1 tablet (50 mg total) by mouth daily. 90 tablet 1  . methocarbamol (ROBAXIN) 500 MG tablet Take 1 tablet (500 mg total) by mouth every 8 (eight) hours as needed for muscle spasms. 90 tablet 1   . pantoprazole (PROTONIX) 40 MG tablet TAKE 1 TABLET (40 MG TOTAL) BY MOUTH DAILY. 30 tablet 3  . pravastatin (PRAVACHOL) 10 MG tablet Take 1 tablet (10 mg total) by mouth daily. 90 tablet 1  . PROVENTIL HFA 108 (90 Base) MCG/ACT inhaler INHALE 1 TO 2 PUFFS BY MOUTH EVERY 6 (SIX) HOURS AS NEEDED FOR WHEEZING OR SHORTNESS OF BREATH. (Patient taking differently: Inhale 1-2 puffs into the lungs every 6 (six) hours as needed for wheezing or shortness of breath.) 6.7 g 1  . traZODone (DESYREL) 50 MG tablet Take 1 tablet (50 mg total) by mouth at bedtime as needed for sleep. 90 tablet 0  . gabapentin (NEURONTIN) 300 MG capsule TAKE 1 CAPSULE (300 MG TOTAL) BY MOUTH 3 (THREE) TIMES DAILY. 90 capsule 1  . LEVEMIR FLEXTOUCH 100 UNIT/ML FlexPen INJECT 50 UNITS INTO THE SKIN 2 (TWO) TIMES DAILY. 30 mL 0  . metFORMIN (GLUCOPHAGE) 1000 MG tablet TAKE 1 TABLET BY MOUTH 2 TIMES DAILY WITH A MEAL. 180 tablet 0  . acetaminophen-codeine (TYLENOL #3) 300-30 MG tablet Take 1 tablet by mouth every 12 (twelve) hours as needed for moderate pain. 40 tablet 0  . benzonatate (TESSALON) 100 MG capsule Take 1 capsule (100 mg total) by mouth every 8 (eight) hours. (Patient not taking: Reported on 05/06/2020) 21 capsule 0  . promethazine-dextromethorphan (PROMETHAZINE-DM) 6.25-15 MG/5ML syrup Take 5 mLs by mouth 4 (four) times daily as needed for cough. (Patient not taking: Reported on 05/06/2020) 118 mL 0   No facility-administered medications prior to visit.    Allergies  Allergen Reactions  . Lisinopril     angioedema    ROS Review of Systems    Pertinent positives and negatives noted in HPI Objective:    Physical Exam Vitals reviewed.  Constitutional:      Appearance: Normal appearance. She is obese.  HENT:     Head: Normocephalic.     Right Ear: External ear normal.     Left Ear: External ear normal.     Nose: Nose normal.  Cardiovascular:     Rate and Rhythm: Normal rate and regular rhythm.  Pulmonary:      Effort: Pulmonary effort is normal.     Breath sounds: Normal breath sounds.  Abdominal:     General: Abdomen is flat. Bowel sounds are normal. There is distension.  Musculoskeletal:        General: Swelling and tenderness present.     Cervical back: Normal range of motion and neck supple.  Skin:    General: Skin is warm and dry.  Neurological:  Mental Status: She is oriented to person, place, and time.  Psychiatric:        Mood and Affect: Mood normal.        Judgment: Judgment normal.     BP 129/87 (BP Location: Left Arm, Patient Position: Sitting, Cuff Size: Large)   Pulse 90   Temp (!) 97.3 F (36.3 C) (Temporal)   Ht '5\' 5"'  (1.651 m)   Wt 254 lb 3.2 oz (115.3 kg)   SpO2 92%   BMI 42.30 kg/m  Wt Readings from Last 3 Encounters:  06/17/20 254 lb 3.2 oz (115.3 kg)  05/06/20 251 lb 9.6 oz (114.1 kg)  04/01/20 254 lb 8 oz (115.4 kg)     Health Maintenance Due  Topic Date Due  . PNEUMOCOCCAL POLYSACCHARIDE VACCINE AGE 80-64 HIGH RISK  Never done  . COLONOSCOPY (Pts 45-57yr Insurance coverage will need to be confirmed)  Never done  . OPHTHALMOLOGY EXAM  10/20/2018  . COVID-19 Vaccine (2 - Moderna 3-dose series) 12/23/2019    There are no preventive care reminders to display for this patient.  Lab Results  Component Value Date   TSH 2.870 10/02/2018   Lab Results  Component Value Date   WBC 11.3 (H) 04/01/2020   HGB 14.1 04/01/2020   HCT 42.3 04/01/2020   MCV 75.4 (L) 04/01/2020   PLT 251 04/01/2020   Lab Results  Component Value Date   NA 143 12/07/2019   K 3.4 (L) 12/07/2019   CO2 25 12/07/2019   GLUCOSE 127 (H) 12/07/2019   BUN 10 12/07/2019   CREATININE 0.72 12/07/2019   BILITOT 0.4 12/07/2019   ALKPHOS 133 (H) 12/07/2019   AST 26 12/07/2019   ALT 47 (H) 12/07/2019   PROT 7.4 12/07/2019   ALBUMIN 3.8 12/07/2019   CALCIUM 9.0 12/07/2019   ANIONGAP 10 12/07/2019   Lab Results  Component Value Date   CHOL 185 08/01/2018   Lab Results   Component Value Date   HDL 40 08/01/2018   Lab Results  Component Value Date   LDLCALC 125 (H) 08/01/2018   Lab Results  Component Value Date   TRIG 102 08/01/2018   Lab Results  Component Value Date   CHOLHDL 4.6 (H) 08/01/2018   Lab Results  Component Value Date   HGBA1C 11.5 (A) 05/06/2020      Assessment & Plan:   [place] seen today for blood pressure check.  Diagnoses and all orders for this visit:  Arthralgia, unspecified joint -     gabapentin (NEURONTIN) 300 MG capsule; Take 1 capsule (300 mg total) by mouth 3 (three) times daily. -     Ambulatory referral to Orthopedic Surgery -     Ambulatory referral to Pain Clinic  Type 2 diabetes mellitus without complication, with long-term current use of insulin (HCC) -     metFORMIN (GLUCOPHAGE) 1000 MG tablet; Take 1 tablet (1,000 mg total) by mouth 2 (two) times daily with a meal.  Current moderate episode of major depressive disorder without prior episode (HCC) Followed by behavioral health  Type 2 diabetes mellitus with diabetic polyneuropathy, without long-term current use of insulin (HCC) Currently on gabapentin 300 mg 3 times daily  Morbid obesity (HCC) Morbid Obesity is>40indicating an excess in caloric intake or underlining conditions. This may lead to other co-morbidities. Lifestyle modifications of diet and exercise may reduce obesity.  Excessive weight can also contribute to pain especially in joints and back Other orders -     insulin detemir (  LEVEMIR FLEXTOUCH) 100 UNIT/ML FlexPen; Inject 50 units twice a day after BKF and dinner   Follow-up: Keep scheduled appointment  Kerin Perna, NP

## 2020-06-18 ENCOUNTER — Other Ambulatory Visit (HOSPITAL_COMMUNITY): Payer: Self-pay | Admitting: Psychiatry

## 2020-06-18 ENCOUNTER — Other Ambulatory Visit (INDEPENDENT_AMBULATORY_CARE_PROVIDER_SITE_OTHER): Payer: Self-pay | Admitting: Primary Care

## 2020-06-18 DIAGNOSIS — F4381 Prolonged grief disorder: Secondary | ICD-10-CM

## 2020-06-18 DIAGNOSIS — F4329 Adjustment disorder with other symptoms: Secondary | ICD-10-CM

## 2020-06-18 DIAGNOSIS — G894 Chronic pain syndrome: Secondary | ICD-10-CM

## 2020-06-18 DIAGNOSIS — F331 Major depressive disorder, recurrent, moderate: Secondary | ICD-10-CM

## 2020-06-18 DIAGNOSIS — E785 Hyperlipidemia, unspecified: Secondary | ICD-10-CM

## 2020-06-18 DIAGNOSIS — E119 Type 2 diabetes mellitus without complications: Secondary | ICD-10-CM

## 2020-06-30 ENCOUNTER — Encounter: Payer: Self-pay | Admitting: Orthopedic Surgery

## 2020-06-30 ENCOUNTER — Ambulatory Visit (INDEPENDENT_AMBULATORY_CARE_PROVIDER_SITE_OTHER): Payer: Medicaid Other | Admitting: Orthopedic Surgery

## 2020-06-30 VITALS — Ht 66.0 in | Wt 244.0 lb

## 2020-06-30 DIAGNOSIS — M1711 Unilateral primary osteoarthritis, right knee: Secondary | ICD-10-CM | POA: Diagnosis not present

## 2020-06-30 DIAGNOSIS — M1712 Unilateral primary osteoarthritis, left knee: Secondary | ICD-10-CM | POA: Diagnosis not present

## 2020-06-30 NOTE — Progress Notes (Signed)
Office Visit Note   Patient: [name]           Date of Birth: [birth year]           MRN: [id number] Visit Date: 06/30/2020 Requested by: [name] Sessions, NP [address] [place],  [place] [address] PCP: [name] Sessions, NP  Subjective: Chief Complaint  Patient presents with  . Left Knee - Pain  . Right Knee - Pain    HPI: [name] is a 50 y.o. female who presents to the office complaining of bilateral knee pain.  Patient denies history of severe knee arthritis.  Left knee bothers her more.  She notes constant pain pain that wakes her up at night.  She has difficulty weightbearing due to her knee pain.  Knees do occasionally give out on her.  Discussed knee replacement at the last office visit and she is working on weight loss and better glycemic control so that she can pursue surgery.  She has started Trulicity and she has plans to recheck her A1c in early April to see if that has improved from her last A1c of 11.5.  She previously weighed herself in early March and was at 244 pounds.  She feels the Trulicity is helping her weight loss as it suppresses her appetite.  She is eating 2 meals per day and avoiding any soft drinks.  She has started pain management and they have prescribed Norco 4 times per day..                ROS: All systems reviewed are negative as they relate to the chief complaint within the history of present illness.  Patient denies fevers or chills.  Assessment & Plan: Visit Diagnoses:  1. Unilateral primary osteoarthritis, right knee   2. Unilateral primary osteoarthritis, left knee     Plan: Patient is a 50 year old female who presents complaint of bilateral knee pain.  She has history of bilateral knee osteoarthritis with valgus deformity of both knees.  Left knee bothers her more.  Her last A1c was 11.5 back in January but she is on a new glycemic control regimen and has started Trulicity.  Her last blood glucose that she checked in the  morning was 109.  She rechecks her A1c in early April.  Today's weight was at 244 pounds.  With a height of 5 foot 6 inches, this meets her BMI requirements with BMI of 39.4.  She will continue to pursue weight loss and controlling her blood glucose.  Plan to call after A1c results in April to discuss posting for surgery if her weight is still in good range.   Follow-Up Instructions: No follow-ups on file.   Orders:  No orders of the defined types were placed in this encounter.  No orders of the defined types were placed in this encounter.     Procedures: No procedures performed   Clinical Data: No additional findings.  Objective: Vital Signs: Ht 5\' 6"  (1.676 m)   Wt 244 lb (110.7 kg)   BMI 39.38 kg/m   Physical Exam:  Constitutional: Patient appears well-developed HEENT:  Head: Normocephalic Eyes:EOM are normal Neck: Normal range of motion Cardiovascular: Normal rate Pulmonary/chest: Effort normal Neurologic: Patient is alert Skin: Skin is warm Psychiatric: Patient has normal mood and affect  Ortho Exam: Ortho exam demonstrates bilateral knees with valgus deformity.  Walking well but with antalgic gait.    Specialty Comments:  No specialty comments available.  Imaging: No results found.  PMFS History: Patient Active Problem List   Diagnosis Date Noted  . MDD (major depressive disorder), recurrent episode, moderate (HCC) 05/13/2020  . 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 Hot flashes 10/02/2018  . Vaginal dryness 10/02/2018  . BIH (benign intracranial hypertension) 11/06/2017  . Somnolence, daytime 07/30/2017  . Snoring 07/30/2017  . Morbid obesity (HCC) 07/30/2017  . Migraine 10/02/2016  . Common migraine with intractable migraine 10/02/2016  . Type 2 diabetes mellitus without complication, without long-term current use of insulin (HCC) 08/17/2016   Past Medical History:  Diagnosis Date  . Anxiety   . Common migraine with intractable  migraine 10/02/2016  . Depression   . Diabetes mellitus   . Hypertension     Family History  Problem Relation Age of Onset  . Diabetes Mother   . Hypertension Mother   . Renal Disease Mother   . Cancer Father   . Diabetes Sister   . Diabetes Brother   . Renal Disease Brother   . Diabetes Brother   . Diabetes Sister   . Diabetes Brother   . Diabetes Brother   . Diabetes Brother   . Healthy Daughter   . Healthy Daughter     Past Surgical History:  Procedure Laterality Date  . CHOLECYSTECTOMY    . TUBAL LIGATION     Social History   Occupational History  . Not on file  Tobacco Use  . Smoking status: Current Every Day Smoker    Packs/day: 0.25    Types: Cigarettes  . Smokeless tobacco: Never Used  Vaping Use  . Vaping Use: Former  Substance and Sexual Activity  . Alcohol use: No  . Drug use: No  . Sexual activity: Yes    Birth control/protection: None

## 2020-07-04 ENCOUNTER — Encounter: Payer: Self-pay | Admitting: Orthopedic Surgery

## 2020-07-05 ENCOUNTER — Ambulatory Visit: Payer: Medicaid Other | Admitting: Pharmacist

## 2020-07-09 ENCOUNTER — Telehealth (INDEPENDENT_AMBULATORY_CARE_PROVIDER_SITE_OTHER): Payer: Medicaid Other | Admitting: Psychiatry

## 2020-07-09 ENCOUNTER — Encounter (HOSPITAL_COMMUNITY): Payer: Self-pay | Admitting: Psychiatry

## 2020-07-09 ENCOUNTER — Other Ambulatory Visit: Payer: Self-pay

## 2020-07-09 DIAGNOSIS — F4329 Adjustment disorder with other symptoms: Secondary | ICD-10-CM | POA: Diagnosis not present

## 2020-07-09 DIAGNOSIS — F331 Major depressive disorder, recurrent, moderate: Secondary | ICD-10-CM

## 2020-07-09 DIAGNOSIS — F4381 Prolonged grief disorder: Secondary | ICD-10-CM

## 2020-07-09 MED ORDER — DULOXETINE HCL 60 MG PO CPEP: 60.0000 mg | ORAL_CAPSULE | Freq: Two times a day (BID) | ORAL | 0 refills | Status: DC

## 2020-07-09 MED ORDER — TRAZODONE HCL 100 MG PO TABS: 100.0000 mg | ORAL_TABLET | Freq: Every day | ORAL | 0 refills | Status: DC

## 2020-07-09 NOTE — Progress Notes (Signed)
[place] [facility] Progress Note   Virtual Visit via Telephone Note  I connected with [name] on 07/09/20 at  9:40 AM EDT by telephone and verified that I am speaking with the correct person using two identifiers.  Location: Patient: home Provider: Clinic   I discussed the limitations, risks, security and privacy concerns of performing an evaluation and management service by telephone and the availability of in person appointments. I also discussed with the patient that there may be a patient responsible charge related to this service. The patient expressed understanding and agreed to proceed.   I provided 16 minutes of non-face-to-face time during this encounter.     Patient Identification: [name] MRN:  [id number] Date of Evaluation:  07/09/2020    Chief Complaint:   " I feel depressed."  Visit Diagnosis:    ICD-10-CM   1. MDD (major depressive disorder), recurrent episode, moderate (HCC)  F33.1 DULoxetine (CYMBALTA) 60 MG capsule    traZODone (DESYREL) 100 MG tablet  2. 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 DULoxetine (CYMBALTA) 60 MG capsule    History of Present Illness: This is a 50 year old female with history of MDD, grief, type 2 diabetes mellitus, hypertension, migraine, rheumatoid arthritis, chronic bilateral knee osteoarthritis now seen for follow-up. Patient reported that she was hoping to talk to the writer however could not find the number. She stated that she still feels depressed.  She was supposed to go out of state to attend her son's court hearing.  Her son was murdered in 31 at age of 30 and that trial is ongoing. Patient stated that she did not go because of several different reasons.  Then she stated that her daughter and other family members advised her not to go because possible to be very difficult for her. She stated that she still dealing with depression and chronic pain issues.  She informed that she just had her ultrasound of knees done  this morning.  She has been seeing pain management and they recommended the ultrasound. She did find the increase in the dose of Cymbalta to be slightly helpful but still has ongoing depression symptoms.  She still feels tearful all the time and has anhedonia.  She still has low energy levels with poor sleep and poor concentration. Her appetite is fluctuating and her blood sugars still fluctuate a lot. She informed that trazodone helps her just a little bit with falling asleep but she still wakes up in the middle of the night and is not getting much rest at night. She was agreeable to going up on the doses of Cymbalta for optimal control of mood as well as trazodone for optimal sleep. She denied any suicidal ideations.  Past Psychiatric History: MDD, grief with prolonged bereavement reaction  Previous Psychotropic Medications: Yes -Celexa, Lexapro, Cymbalta  Substance Abuse History in the last 12 months:  No.  Consequences of Substance Abuse: Negative  Past Medical History:  Past Medical History:  Diagnosis Date  . Anxiety   . Common migraine with intractable migraine 10/02/2016  . Depression   . Diabetes mellitus   . Hypertension     Past Surgical History:  Procedure Laterality Date  . CHOLECYSTECTOMY    . TUBAL LIGATION      Family Psychiatric History: Denied any family psychiatric history  Family History:  Family History  Problem Relation Age of Onset  . Diabetes Mother   . Hypertension Mother   . Renal Disease Mother   . Cancer Father   .  Diabetes Sister   . Diabetes Brother   . Renal Disease Brother   . Diabetes Brother   . Diabetes Sister   . Diabetes Brother   . Diabetes Brother   . Diabetes Brother   . Healthy Daughter   . Healthy Daughter     Social History:   Social History   Socioeconomic History  . Marital status: Single    Spouse name: Not on file  . Number of children: Not on file  . Years of education: Not on file  . Highest education level:  Not on file  Occupational History  . Not on file  Tobacco Use  . Smoking status: Current Every Day Smoker    Packs/day: 0.25    Types: Cigarettes  . Smokeless tobacco: Never Used  Vaping Use  . Vaping Use: Former  Substance and Sexual Activity  . Alcohol use: No  . Drug use: No  . Sexual activity: Yes    Birth control/protection: None  Other Topics Concern  . Not on file  Social History Narrative   Right handed    Lives with daughter   Caffeine use: Drinks coffee/tea/soda sometimes   Social Determinants of Health   Financial Resource Strain: Not on file  Food Insecurity: Not on file  Transportation Needs: Not on file  Physical Activity: Not on file  Stress: Not on file  Social Connections: Not on file    Additional Social History: Lives by herself, family helps with bills, has applied for disability.  Allergies:   Allergies  Allergen Reactions  . Lisinopril     angioedema    Metabolic Disorder Labs: Lab Results  Component Value Date   HGBA1C 11.5 (A) 05/06/2020   No results found for: PROLACTIN Lab Results  Component Value Date   CHOL 185 08/01/2018   TRIG 102 08/01/2018   HDL 40 08/01/2018   CHOLHDL 4.6 (H) 08/01/2018   LDLCALC 125 (H) 08/01/2018   LDLCALC 98 08/17/2016   Lab Results  Component Value Date   TSH 2.870 10/02/2018    Therapeutic Level Labs: No results found for: LITHIUM No results found for: CBMZ No results found for: VALPROATE  Current Medications: Current Outpatient Medications  Medication Sig Dispense Refill  . traZODone (DESYREL) 100 MG tablet Take 1 tablet (100 mg total) by mouth at bedtime. 90 tablet 0  . Accu-Chek Softclix Lancets lancets Use as instructed 100 each 12  . amLODipine (NORVASC) 10 MG tablet Take 1 tablet (10 mg total) by mouth daily. 90 tablet 3  . aspirin EC 81 MG tablet Take 81 mg by mouth every 6 (six) hours as needed for mild pain.     . blood glucose meter kit and supplies Dispense based on patient and  insurance preference. Use up to four times daily as directed. (FOR ICD-10 E10.9, E11.9). 1 each 0  . celecoxib (CELEBREX) 200 MG capsule Take 200 mg by mouth 2 (two) times daily.    . Dulaglutide (TRULICITY) 1.5 XO/3.2NV SOPN Inject 1.5 mg into the skin once a week. 2 mL 2  . DULoxetine (CYMBALTA) 60 MG capsule Take 1 capsule (60 mg total) by mouth 2 (two) times daily. 180 capsule 0  . fluticasone (FLONASE) 50 MCG/ACT nasal spray Place 1 spray into both nostrils daily. 15 mL 2  . fluticasone (FLOVENT HFA) 44 MCG/ACT inhaler Inhale 2 puffs into the lungs 2 (two) times daily. 1 Inhaler 12  . gabapentin (NEURONTIN) 300 MG capsule Take 1 capsule (300 mg total)  by mouth 3 (three) times daily. 90 capsule 1  . glucose blood (ACCU-CHEK AVIVA PLUS) test strip Use as instructed 100 each 12  . ibuprofen (ADVIL) 800 MG tablet Take 1 tablet (800 mg total) by mouth every 8 (eight) hours as needed for headache. 90 tablet 1  . insulin detemir (LEVEMIR FLEXTOUCH) 100 UNIT/ML FlexPen Inject 50 units twice a day after BKF and dinner 30 mL 3  . Insulin Pen Needle 31G X 8 MM MISC Use to inject Levemir BID. 100 each 2  . Lancets (ACCU-CHEK SOFT TOUCH) lancets Use as instructed 100 each 12  . losartan (COZAAR) 50 MG tablet Take 1 tablet (50 mg total) by mouth daily. 90 tablet 1  . metFORMIN (GLUCOPHAGE) 1000 MG tablet Take 1 tablet (1,000 mg total) by mouth 2 (two) times daily with a meal. 180 tablet 1  . methocarbamol (ROBAXIN) 500 MG tablet Take 1 tablet (500 mg total) by mouth every 8 (eight) hours as needed for muscle spasms. 90 tablet 1  . pantoprazole (PROTONIX) 40 MG tablet TAKE 1 TABLET (40 MG TOTAL) BY MOUTH DAILY. 30 tablet 3  . pravastatin (PRAVACHOL) 10 MG tablet Take 1 tablet (10 mg total) by mouth daily. 90 tablet 1  . PROVENTIL HFA 108 (90 Base) MCG/ACT inhaler INHALE 1 TO 2 PUFFS BY MOUTH EVERY 6 (SIX) HOURS AS NEEDED FOR WHEEZING OR SHORTNESS OF BREATH. (Patient taking differently: Inhale 1-2 puffs into  the lungs every 6 (six) hours as needed for wheezing or shortness of breath.) 6.7 g 1   No current facility-administered medications for this visit.     Psychiatric Specialty Exam: Review of Systems  There were no vitals taken for this visit.There is no height or weight on file to calculate BMI.  General Appearance: Unable to assess due to phone visit  Eye Contact:  Unable to assess due to phone visit  Speech:  Clear and Coherent and Slow  Volume:  Normal  Mood:  Depressed and Dysphoric  Affect:  Congruent  Thought Process:  Goal Directed and Descriptions of Associations: Intact  Orientation:  Full (Time, Place, and Person)  Thought Content:  Logical  Suicidal Thoughts:  No  Homicidal Thoughts:  No  Memory:  Immediate;   Good Recent;   Good Remote;   Good  Judgement:  Fair  Insight:  Fair  Psychomotor Activity:  Decreased  Concentration:  Concentration: Good and Attention Span: Good  Recall:  Good  Fund of Knowledge:Good  Language: Good  Akathisia:  Negative  Handed:  Right  AIMS (if indicated):  not done  Assets:  Communication Skills Desire for Improvement Financial Resources/Insurance Housing Social Support  ADL's:  Intact  Cognition: WNL  Sleep:  Poor   Screenings: GAD-7   Flowsheet Row Office Visit from 06/17/2020 in [place] Office Visit from 05/06/2020 in [place] Office Visit from 03/09/2020 in [place] Office Visit from 01/05/2020 in [place] Office Visit from 10/01/2019 in [place]  Total GAD-7 Score '19 7 18 13 9    ' PHQ2-9   Flowsheet Row Video Visit from 07/09/2020 in [facility] Office Visit from 06/17/2020 in [place] Video Visit from 05/13/2020 in [facility] Office Visit from 05/06/2020 in [place] Office Visit from 03/09/2020  in [place]  PHQ-2 Total Score 4 4 6  2 6  PHQ-9 Total Score '15 18 22 8 17    ' Flowsheet Row Video Visit from 07/09/2020 in [facility] Office Visit from 06/17/2020 in [place] Video Visit from 05/13/2020 in [place] Error: Q3, 4, or 5 should not be populated when Q2 is No Error: Q3, 4, or 5 should not be populated when Q2 is No Error: Q3, 4, or 5 should not be populated when Q2 is No      Assessment and Plan: Patient is continue to deal with ongoing depression symptoms and complicated grief after the loss of her son in 25.  She did not find any significant improvement in her symptoms after her dose of Cymbalta was adjusted 2 months ago and also trazodone was added for poor sleep.  She is agreeable to adjusting the dose of these medicines for optimal effect.  Writer recommended that she sees a therapist in conjunction with med management however she stated that she just wants to talk to the Probation officer. She asked if this number was the writer's office number.  Patient was informed that she can contact this number when needed.  1. MDD (major depressive disorder), recurrent episode, moderate (HCC)  - Increase DULoxetine (CYMBALTA) 60 MG capsule; Take 1 capsule (60 mg total) by mouth 2 (two) times daily.  Dispense: 180 capsule; Refill: 0 - Increase traZODone (DESYREL) 100 MG tablet; Take 1 tablet (100 mg total) by mouth at bedtime.  Dispense: 90 tablet; Refill: 0  2. Grief reaction with prolonged bereavement  - DULoxetine (CYMBALTA) 60 MG capsule; Take 1 capsule (60 mg total) by mouth 2 (two) times daily.  Dispense: 180 capsule; Refill: 0   F/up in 6 weeks. Patient was offered to be connected to a therapist for supportive therapy however patient declined the need for that at this point.   [place] Crane, MD 4/1/20229:27 AM

## 2020-07-19 ENCOUNTER — Other Ambulatory Visit: Payer: Self-pay

## 2020-07-19 ENCOUNTER — Encounter (INDEPENDENT_AMBULATORY_CARE_PROVIDER_SITE_OTHER): Payer: Medicaid Other

## 2020-07-22 ENCOUNTER — Ambulatory Visit (INDEPENDENT_AMBULATORY_CARE_PROVIDER_SITE_OTHER): Payer: Medicaid Other

## 2020-07-28 ENCOUNTER — Ambulatory Visit: Payer: Self-pay | Admitting: Orthopedic Surgery

## 2020-08-04 ENCOUNTER — Ambulatory Visit (INDEPENDENT_AMBULATORY_CARE_PROVIDER_SITE_OTHER): Payer: Medicaid Other | Admitting: Primary Care

## 2020-08-07 ENCOUNTER — Other Ambulatory Visit (INDEPENDENT_AMBULATORY_CARE_PROVIDER_SITE_OTHER): Payer: Self-pay | Admitting: Primary Care

## 2020-08-07 DIAGNOSIS — E119 Type 2 diabetes mellitus without complications: Secondary | ICD-10-CM

## 2020-08-07 DIAGNOSIS — M2550 Pain in unspecified joint: Secondary | ICD-10-CM

## 2020-08-07 DIAGNOSIS — E785 Hyperlipidemia, unspecified: Secondary | ICD-10-CM

## 2020-08-07 NOTE — Telephone Encounter (Signed)
Requested medication (s) are due for refill today: celecoxib: - pravastatin: yes  Requested medication (s) are on the active medication list: celecoxib: no Pravastatin: yes   Last refill:  Celecoxib: 06/17/20   pravastatin: 01/05/20  Future visit scheduled: yes  Notes to clinic:  celecoxib: historical med and provider      Pravastatin: overdue lab work   Requested Prescriptions  Pending Prescriptions Disp Refills   celecoxib (CELEBREX) 200 MG capsule [Pharmacy Med Name: CELECOXIB 200 MG ORAL CAPSULE] 180 capsule     Sig: TAKE 1 CAPSULE BY MOUTH 2 TIMES DAILY.      Analgesics:  COX2 Inhibitors Passed - 08/07/2020  1:24 PM      Passed - HGB in normal range and within 360 days    Hemoglobin  Date Value Ref Range Status  04/01/2020 14.1 12.0 - 15.0 g/dL Final  81/82/9937 16.9 11.1 - 15.9 g/dL Final          Passed - Cr in normal range and within 360 days    Creatinine, Ser  Date Value Ref Range Status  12/07/2019 0.72 0.44 - 1.00 mg/dL Final          Passed - Patient is not pregnant      Passed - Valid encounter within last 12 months    Recent Outpatient Visits           1 month ago Arthralgia, unspecified joint   [facility] Grayce Sessions, NP   1 month ago Type 2 diabetes mellitus without complication, with long-term current use of insulin (HCC)   [facility] [place], Jeannett Senior L, RPH-CPP   2 months ago Type 2 diabetes mellitus without complication, with long-term current use of insulin [facility])   [place] [place], Jeannett Senior L, RPH-CPP   3 months ago Type 2 diabetes mellitus without complication, without long-term current use of insulin (HCC)   [facility] Grayce Sessions, NP   5 months ago Leukocytosis, unspecified type   [facility] Grayce Sessions, NP       Future Appointments             In 4 days August Saucer, Corrie Mckusick,  MD [facility]   In 6 days Grayce Sessions, NP [facility]               pravastatin (PRAVACHOL) 10 MG tablet [Pharmacy Med Name: PRAVASTATIN SODIUM 10 MG ORAL TABLET] 90 tablet     Sig: TAKE 1 TABLET (10 MG TOTAL) BY MOUTH DAILY.      Cardiovascular:  Antilipid - Statins Failed - 08/07/2020  1:24 PM      Failed - Total Cholesterol in normal range and within 360 days    Cholesterol, Total  Date Value Ref Range Status  08/01/2018 185 100 - 199 mg/dL Final          Failed - LDL in normal range and within 360 days    LDL Calculated  Date Value Ref Range Status  08/01/2018 125 (H) 0 - 99 mg/dL Final          Failed - HDL in normal range and within 360 days    HDL  Date Value Ref Range Status  08/01/2018 40 >39 mg/dL Final          Failed - Triglycerides in normal range and within 360 days  Triglycerides  Date Value Ref Range Status  08/01/2018 102 0 - 149 mg/dL Final          Passed - Patient is not pregnant      Passed - Valid encounter within last 12 months    Recent Outpatient Visits           1 month ago Arthralgia, unspecified joint   [facility] Grayce Sessions, NP   1 month ago Type 2 diabetes mellitus without complication, with long-term current use of insulin (HCC)   [place] [facility] [place], Jeannett Senior L, RPH-CPP   2 months ago Type 2 diabetes mellitus without complication, with long-term current use of insulin [facility])   [place] [facility] [place], Jeannett Senior L, RPH-CPP   3 months ago Type 2 diabetes mellitus without complication, without long-term current use of insulin (HCC)   [facility] Grayce Sessions, NP   5 months ago Leukocytosis, unspecified type   [facility] Grayce Sessions, NP       Future Appointments             In 4 days August Saucer, Corrie Mckusick, MD [facility]   In 6 days Grayce Sessions, NP [facility]              Signed Prescriptions Disp Refills   gabapentin (NEURONTIN) 300 MG capsule 90 capsule 1    Sig: TAKE 1 CAPSULE (300 MG TOTAL) BY MOUTH 3 (THREE) TIMES DAILY.      Neurology: Anticonvulsants - gabapentin Passed - 08/07/2020  1:24 PM      Passed - Valid encounter within last 12 months    Recent Outpatient Visits           1 month ago Arthralgia, unspecified joint   [facility] Grayce Sessions, NP   1 month ago Type 2 diabetes mellitus without complication, with long-term current use of insulin (HCC)   [place] [place], Jeannett Senior L, RPH-CPP   2 months ago Type 2 diabetes mellitus without complication, with long-term current use of insulin [facility])   [place] [facility] [place], Jeannett Senior L, RPH-CPP   3 months ago Type 2 diabetes mellitus without complication, without long-term current use of insulin (HCC)   [facility] Grayce Sessions, NP   5 months ago Leukocytosis, unspecified type   [facility] Grayce Sessions, NP       Future Appointments             In 4 days August Saucer, Corrie Mckusick, MD [facility]   In 6 days Grayce Sessions, NP [facility]              Refused Prescriptions Disp Refills   glimepiride (AMARYL) 4 MG tablet [Pharmacy Med Name: GLIMEPIRIDE 4 MG ORAL TABLET] 90 tablet     Sig: TAKE 1 TABLET (4 MG TOTAL) BY MOUTH DAILY WITH BREAKFAST.      Endocrinology:  Diabetes - Sulfonylureas Failed - 08/07/2020  1:24 PM      Failed - HBA1C is between 0 and 7.9 and within 180 days    Hemoglobin A1C  Date Value Ref Range Status  05/06/2020 11.5 (A) 4.0 - 5.6 % Final   Hgb A1c MFr Bld  Date Value Ref Range Status  05/08/2017 7.4 (H) 4.8 - 5.6 % Final    Comment:             Prediabetes: 5.7 -  6.4          Diabetes: >6.4          Glycemic control for adults with diabetes: <7.0           Passed - Valid encounter within last 6 months    Recent Outpatient Visits           1 month ago Arthralgia, unspecified joint   [facility] Grayce Sessions, NP   1 month ago Type 2 diabetes mellitus without complication, with long-term current use of insulin (HCC)   [facility] [place], Jeannett Senior L, RPH-CPP   2 months ago Type 2 diabetes mellitus without complication, with long-term current use of insulin [facility])   [place] [facility] [place], Jeannett Senior L, RPH-CPP   3 months ago Type 2 diabetes mellitus without complication, without long-term current use of insulin (HCC)   [facility] Grayce Sessions, NP   5 months ago Leukocytosis, unspecified type   [facility] Grayce Sessions, NP       Future Appointments             In 4 days August Saucer, Corrie Mckusick, MD [facility] [place]   In 6 days Grayce Sessions, NP [facility]

## 2020-08-07 NOTE — Telephone Encounter (Signed)
Requested Prescriptions  Pending Prescriptions Disp Refills  . celecoxib (CELEBREX) 200 MG capsule [Pharmacy Med Name: CELECOXIB 200 MG ORAL CAPSULE] 180 capsule     Sig: TAKE 1 CAPSULE BY MOUTH 2 TIMES DAILY.     Analgesics:  COX2 Inhibitors Passed - 08/07/2020  1:24 PM      Passed - HGB in normal range and within 360 days    Hemoglobin  Date Value Ref Range Status  04/01/2020 14.1 12.0 - 15.0 g/dL Final  94/49/6759 16.3 11.1 - 15.9 g/dL Final         Passed - Cr in normal range and within 360 days    Creatinine, Ser  Date Value Ref Range Status  12/07/2019 0.72 0.44 - 1.00 mg/dL Final         Passed - Patient is not pregnant      Passed - Valid encounter within last 12 months    Recent Outpatient Visits          1 month ago Arthralgia, unspecified joint   [facility] Grayce Sessions, NP   1 month ago Type 2 diabetes mellitus without complication, with long-term current use of insulin (HCC)   [place] [facility] [place], Jeannett Senior L, RPH-CPP   2 months ago Type 2 diabetes mellitus without complication, with long-term current use of insulin [facility])   [place] [facility] [place], Jeannett Senior L, RPH-CPP   3 months ago Type 2 diabetes mellitus without complication, without long-term current use of insulin (HCC)   [facility] Grayce Sessions, NP   5 months ago Leukocytosis, unspecified type   [facility] Grayce Sessions, NP      Future Appointments            In 4 days August Saucer, Corrie Mckusick, MD [facility] [place]   In 6 days Grayce Sessions, NP [place] [facility]           . glimepiride (AMARYL) 4 MG tablet [Pharmacy Med Name: GLIMEPIRIDE 4 MG ORAL TABLET] 90 tablet     Sig: TAKE 1 TABLET (4 MG TOTAL) BY MOUTH DAILY WITH BREAKFAST.     Endocrinology:  Diabetes - Sulfonylureas Failed - 08/07/2020  1:24 PM      Failed - HBA1C  is between 0 and 7.9 and within 180 days    Hemoglobin A1C  Date Value Ref Range Status  05/06/2020 11.5 (A) 4.0 - 5.6 % Final   Hgb A1c MFr Bld  Date Value Ref Range Status  05/08/2017 7.4 (H) 4.8 - 5.6 % Final    Comment:             Prediabetes: 5.7 - 6.4          Diabetes: >6.4          Glycemic control for adults with diabetes: <7.0          Passed - Valid encounter within last 6 months    Recent Outpatient Visits          1 month ago Arthralgia, unspecified joint   [facility] Grayce Sessions, NP   1 month ago Type 2 diabetes mellitus without complication, with long-term current use of insulin [facility])    [place] [facility] [place], Jeannett Senior L, RPH-CPP   2 months ago Type 2 diabetes mellitus without complication, with long-term current use of  insulin [facility])   [place] [facility] [place], Jeannett Senior L, RPH-CPP   3 months ago Type 2 diabetes mellitus without complication, without long-term current use of insulin (HCC)   [facility] Grayce Sessions, NP   5 months ago Leukocytosis, unspecified type   [facility] Grayce Sessions, NP      Future Appointments            In 4 days August Saucer, Corrie Mckusick, MD [facility] [place]   In 6 days Grayce Sessions, NP [facility]           . pravastatin (PRAVACHOL) 10 MG tablet [Pharmacy Med Name: PRAVASTATIN SODIUM 10 MG ORAL TABLET] 90 tablet     Sig: TAKE 1 TABLET (10 MG TOTAL) BY MOUTH DAILY.     Cardiovascular:  Antilipid - Statins Failed - 08/07/2020  1:24 PM      Failed - Total Cholesterol in normal range and within 360 days    Cholesterol, Total  Date Value Ref Range Status  08/01/2018 185 100 - 199 mg/dL Final         Failed - LDL in normal range and within 360 days    LDL Calculated  Date Value Ref Range Status  08/01/2018 125 (H) 0 - 99 mg/dL Final          Failed - HDL in normal range and within 360 days    HDL  Date Value Ref Range Status  08/01/2018 40 >39 mg/dL Final         Failed - Triglycerides in normal range and within 360 days    Triglycerides  Date Value Ref Range Status  08/01/2018 102 0 - 149 mg/dL Final         Passed - Patient is not pregnant      Passed - Valid encounter within last 12 months    Recent Outpatient Visits          1 month ago Arthralgia, unspecified joint   [facility] Grayce Sessions, NP   1 month ago Type 2 diabetes mellitus without complication, with long-term current use of insulin (HCC)   [place] [facility] [place], Jeannett Senior L, RPH-CPP   2 months ago Type 2 diabetes mellitus without complication, with long-term current use of insulin [facility])   [facility] [place], [place] L, RPH-CPP   3 months ago Type 2 diabetes mellitus without complication, without long-term current use of insulin (HCC)   [facility] Grayce Sessions, NP   5 months ago Leukocytosis, unspecified type   [facility] Grayce Sessions, NP      Future Appointments            In 4 days August Saucer, Corrie Mckusick, MD [facility]   In 6 days Grayce Sessions, NP [facility]           . gabapentin (NEURONTIN) 300 MG capsule [Pharmacy Med Name: GABAPENTIN 300 MG ORAL CAPSULE] 90 capsule 1    Sig: TAKE 1 CAPSULE (300 MG TOTAL) BY MOUTH 3 (THREE) TIMES DAILY.     Neurology: Anticonvulsants - gabapentin Passed - 08/07/2020  1:24 PM      Passed - Valid encounter within last 12 months    Recent Outpatient Visits  1 month ago Arthralgia, unspecified joint   [facility] Grayce Sessions, NP   1 month ago Type 2 diabetes mellitus without complication, with long-term current use of insulin [facility])   [place] [facility] [place], Jeannett Senior L, RPH-CPP   2 months ago Type 2 diabetes mellitus without complication, with long-term current use of insulin [facility])    [facility] [place], Jeannett Senior L, RPH-CPP   3 months ago Type 2 diabetes mellitus without complication, without long-term current use of insulin (HCC)   [facility] Grayce Sessions, NP   5 months ago Leukocytosis, unspecified type   [facility] Grayce Sessions, NP      Future Appointments            In 4 days August Saucer, Corrie Mckusick, MD [facility]   In 6 days Grayce Sessions, NP [facility]

## 2020-08-11 ENCOUNTER — Ambulatory Visit: Payer: Self-pay | Admitting: Orthopedic Surgery

## 2020-08-13 ENCOUNTER — Ambulatory Visit (INDEPENDENT_AMBULATORY_CARE_PROVIDER_SITE_OTHER): Payer: Medicaid Other | Admitting: Primary Care

## 2020-08-13 ENCOUNTER — Other Ambulatory Visit: Payer: Self-pay

## 2020-08-13 ENCOUNTER — Encounter (INDEPENDENT_AMBULATORY_CARE_PROVIDER_SITE_OTHER): Payer: Self-pay | Admitting: Primary Care

## 2020-08-13 VITALS — BP 138/92 | HR 71 | Temp 97.5°F | Ht 66.0 in | Wt 241.0 lb

## 2020-08-13 DIAGNOSIS — G894 Chronic pain syndrome: Secondary | ICD-10-CM

## 2020-08-13 DIAGNOSIS — I10 Essential (primary) hypertension: Secondary | ICD-10-CM

## 2020-08-13 DIAGNOSIS — E119 Type 2 diabetes mellitus without complications: Secondary | ICD-10-CM

## 2020-08-13 LAB — GLUCOSE, POCT (MANUAL RESULT ENTRY): POC Glucose: 216 mg/dl — AB (ref 70–99)

## 2020-08-13 LAB — POCT GLYCOSYLATED HEMOGLOBIN (HGB A1C): Hemoglobin A1C: 8.3 % — AB (ref 4.0–5.6)

## 2020-08-13 MED ORDER — IBUPROFEN 800 MG PO TABS: 800.0000 mg | ORAL_TABLET | Freq: Three times a day (TID) | ORAL | 0 refills | Status: DC | PRN

## 2020-08-13 NOTE — Progress Notes (Signed)
Subjective:  Patient ID: [name], female    DOB: 03-20-1971  Age: 50 y.o. MRN: [id number]  CC: Diabetes   HPI [name] presents forFollow-up of diabetes. Patient does not check blood sugar at home  Compliant with meds - Yes Checking CBGs? No  Fasting avg -   Postprandial average -  Exercising regularly? - No Watching carbohydrate intake? - Yes Neuropathy ? - Yes Hypoglycemic events - No  - Recovers with :   Pertinent ROS:  Polyuria - No Polydipsia - No Vision problems - No  Medications as noted below. Taking them regularly without complication/adverse reaction being reported today.   History Bruchy has a past medical history of Anxiety, Common migraine with intractable migraine (10/02/2016), Depression, Diabetes mellitus, and Hypertension.   She has a past surgical history that includes Cholecystectomy and Tubal ligation.   Her family history includes Cancer in her father; Diabetes in her brother, brother, brother, brother, brother, mother, sister, and sister; Healthy in her daughter and daughter; Hypertension in her mother; Renal Disease in her brother and mother.She reports that she has been smoking cigarettes. She has been smoking about 0.25 packs per day. She has never used smokeless tobacco. She reports that she does not drink alcohol and does not use drugs.  Current Outpatient Medications on File Prior to Visit  Medication Sig Dispense Refill  . Accu-Chek Softclix Lancets lancets Use as instructed 100 each 12  . amLODipine (NORVASC) 10 MG tablet Take 1 tablet (10 mg total) by mouth daily. 90 tablet 3  . aspirin EC 81 MG tablet Take 81 mg by mouth every 6 (six) hours as needed for mild pain.     . blood glucose meter kit and supplies Dispense based on patient and insurance preference. Use up to four times daily as directed. (FOR ICD-10 E10.9, E11.9). 1 each 0  . DULoxetine (CYMBALTA) 60 MG capsule Take 1 capsule (60 mg total) by mouth 2 (two) times daily. 180  capsule 0  . fluticasone (FLONASE) 50 MCG/ACT nasal spray Place 1 spray into both nostrils daily. 15 mL 2  . fluticasone (FLOVENT HFA) 44 MCG/ACT inhaler Inhale 2 puffs into the lungs 2 (two) times daily. 1 Inhaler 12  . gabapentin (NEURONTIN) 300 MG capsule TAKE 1 CAPSULE (300 MG TOTAL) BY MOUTH 3 (THREE) TIMES DAILY. 90 capsule 1  . glucose blood (ACCU-CHEK AVIVA PLUS) test strip Use as instructed 100 each 12  . insulin detemir (LEVEMIR FLEXTOUCH) 100 UNIT/ML FlexPen Inject 50 units twice a day after BKF and dinner 30 mL 3  . Insulin Pen Needle 31G X 8 MM MISC Use to inject Levemir BID. 100 each 2  . Lancets (ACCU-CHEK SOFT TOUCH) lancets Use as instructed 100 each 12  . losartan (COZAAR) 50 MG tablet Take 1 tablet (50 mg total) by mouth daily. 90 tablet 1  . metFORMIN (GLUCOPHAGE) 1000 MG tablet Take 1 tablet (1,000 mg total) by mouth 2 (two) times daily with a meal. 180 tablet 1  . methocarbamol (ROBAXIN) 500 MG tablet Take 1 tablet (500 mg total) by mouth every 8 (eight) hours as needed for muscle spasms. 90 tablet 1  . pantoprazole (PROTONIX) 40 MG tablet TAKE 1 TABLET (40 MG TOTAL) BY MOUTH DAILY. 30 tablet 3  . pravastatin (PRAVACHOL) 10 MG tablet TAKE 1 TABLET (10 MG TOTAL) BY MOUTH DAILY. 90 tablet 1  . PROVENTIL HFA 108 (90 Base) MCG/ACT inhaler INHALE 1 TO 2 PUFFS BY MOUTH EVERY 6 (SIX) HOURS AS  NEEDED FOR WHEEZING OR SHORTNESS OF BREATH. (Patient taking differently: Inhale 1-2 puffs into the lungs every 6 (six) hours as needed for wheezing or shortness of breath.) 6.7 g 1  . traZODone (DESYREL) 100 MG tablet Take 1 tablet (100 mg total) by mouth at bedtime. 90 tablet 0   No current facility-administered medications on file prior to visit.    ROS Review of Systems  Musculoskeletal: Positive for back pain and neck pain.       Knees  Neurological: Positive for numbness.  All other systems reviewed and are negative.   Objective:  BP (!) 138/92 (BP Location: Left Arm, Patient  Position: Sitting, Cuff Size: Large)   Pulse 71   Temp (!) 97.5 F (36.4 C) (Temporal)   Ht '5\' 6"'  (1.676 m)   Wt 241 lb (109.3 kg)   SpO2 98%   BMI 38.90 kg/m   BP Readings from Last 3 Encounters:  08/13/20 (!) 138/92  06/17/20 129/87  05/06/20 (!) 156/99    Wt Readings from Last 3 Encounters:  08/13/20 241 lb (109.3 kg)  06/30/20 244 lb (110.7 kg)  06/17/20 254 lb 3.2 oz (115.3 kg)    Physical Exam General: Vital signs reviewed.  Patient is well-developed and well-nourished, obese female in no acute distress and cooperative with exam.  Head: Normocephalic and atraumatic. Eyes: EOMI, conjunctivae normal, no scleral icterus.  Neck: Supple, trachea midline, normal ROM, no JVD, masses, thyromegaly, or carotid bruit present.  Cardiovascular: RRR, S1 normal, S2 normal, no murmurs, gallops, or rubs. Pulmonary/Chest: Clear to auscultation bilaterally, no wheezes, rales, or rhonchi. Abdominal: Soft, non-tender, non-distended, BS +, no masses, organomegaly, or guarding present.  Musculoskeletal: No joint deformities, erythema, or stiffness, ROM full and nontender. Extremities: No lower extremity edema bilaterally,  pulses symmetric and intact bilaterally. No cyanosis or clubbing. Neurological: A&O x3,  Skin: Warm, dry and intact.Psychiatric: Normal mood and affect. speech and behavior is normal. Cognition and memory are normal.   Lab Results  Component Value Date   WBC 11.3 (H) 04/01/2020   HGB 14.1 04/01/2020   HCT 42.3 04/01/2020   PLT 251 04/01/2020   GLUCOSE 127 (H) 12/07/2019   CHOL 185 08/01/2018   TRIG 102 08/01/2018   HDL 40 08/01/2018   LDLCALC 125 (H) 08/01/2018   ALT 47 (H) 12/07/2019   AST 26 12/07/2019   NA 143 12/07/2019   K 3.4 (L) 12/07/2019   CL 108 12/07/2019   CREATININE 0.72 12/07/2019   BUN 10 12/07/2019   CO2 25 12/07/2019   TSH 2.870 10/02/2018   HGBA1C 8.3 (A) 08/13/2020     Assessment & Plan:   Lezette was seen today for  diabetes.  Diagnoses and all orders for this visit:  Type 2 diabetes mellitus without complication, without long-term current use of insulin (HCC) -     HgB A1c -     Glucose (CBG)  Essential hypertension  Morbid obesity (HCC)  Chronic pain syndrome -     ibuprofen (ADVIL) 800 MG tablet; Take 1 tablet (800 mg total) by mouth every 8 (eight) hours as needed.  Joseline was seen today for diabetes.  Diagnoses and all orders for this visit:  Type 2 diabetes mellitus without complication, without long-term current use of insulin (HCC) -     HgB A1c 8.3 very please previously 11.5 3 months ago  -     Glucose (CBG) : Goal of therapy: Less than 6.5 hemoglobin A1c.  Not met Reference clinical practice  recommendations. Foods that are high in carbohydrates are the following rice, potatoes, breads, sugars, and pastas.  Reduction in the intake (eating) will assist in lowering your blood sugars.  Essential hypertension Counseled on blood pressure goal of less than 130/80, low-sodium, DASH diet, medication compliance, 150 minutes of moderate intensity exercise per week. Discussed medication compliance, adverse effects.  Morbid obesity ([place]) Morbid Obesity is > 35.5 with 2 other co-morbidity indicating an excess in caloric intake or underlining conditions. This may lead to other co-morbidities. Lifestyle modifications of diet and exercise may reduce obesity. (CVD, respiratory complication- JSH/F0Y present)  Chronic pain syndrome -     ibuprofen (ADVIL) 800 MG tablet; Take 1 tablet (800 mg total) by mouth every 8 (eight) hours as needed.   I have discontinued Selene Adduci's celecoxib. I am also having her start on ibuprofen. Additionally, I am having her maintain her aspirin EC, Proventil HFA, Flovent HFA, Accu-Chek Aviva Plus, Accu-Chek Softclix Lancets, accu-chek soft touch, blood glucose meter kit and supplies, methocarbamol, pantoprazole, fluticasone, losartan, amLODipine, Insulin Pen Needle,  Levemir FlexTouch, metFORMIN, DULoxetine, traZODone, pravastatin, and gabapentin.  Meds ordered this encounter  Medications  . ibuprofen (ADVIL) 800 MG tablet    Sig: Take 1 tablet (800 mg total) by mouth every 8 (eight) hours as needed.    Dispense:  90 tablet    Refill:  0     Follow-up:   Return in about 3 months (around 11/13/2020) for DM/HTN.  The above assessment and management plan was discussed with the patient. The patient verbalized understanding of and has agreed to the management plan. Patient is aware to call the clinic if symptoms fail to improve or worsen. Patient is aware when to return to the clinic for a follow-up visit. Patient educated on when it is appropriate to go to the emergency department.   Juluis Mire, NP-C

## 2020-08-16 ENCOUNTER — Other Ambulatory Visit: Payer: Self-pay | Admitting: Family Medicine

## 2020-08-16 DIAGNOSIS — E119 Type 2 diabetes mellitus without complications: Secondary | ICD-10-CM

## 2020-08-25 ENCOUNTER — Telehealth (INDEPENDENT_AMBULATORY_CARE_PROVIDER_SITE_OTHER): Payer: Medicaid Other | Admitting: Psychiatry

## 2020-08-25 ENCOUNTER — Other Ambulatory Visit: Payer: Self-pay

## 2020-08-25 ENCOUNTER — Encounter (HOSPITAL_COMMUNITY): Payer: Self-pay | Admitting: Psychiatry

## 2020-08-25 DIAGNOSIS — F331 Major depressive disorder, recurrent, moderate: Secondary | ICD-10-CM

## 2020-08-25 DIAGNOSIS — F3341 Major depressive disorder, recurrent, in partial remission: Secondary | ICD-10-CM | POA: Diagnosis not present

## 2020-08-25 DIAGNOSIS — F4329 Adjustment disorder with other symptoms: Secondary | ICD-10-CM

## 2020-08-25 DIAGNOSIS — F4381 Prolonged grief disorder: Secondary | ICD-10-CM

## 2020-08-25 MED ORDER — DULOXETINE HCL 60 MG PO CPEP: 60.0000 mg | ORAL_CAPSULE | Freq: Two times a day (BID) | ORAL | 0 refills | Status: DC

## 2020-08-25 MED ORDER — TRAZODONE HCL 100 MG PO TABS: 100.0000 mg | ORAL_TABLET | Freq: Every day | ORAL | 0 refills | Status: DC

## 2020-08-25 NOTE — Progress Notes (Signed)
[facility] Progress Note   Virtual Visit via Telephone Note  I connected with [name] on 08/25/20 at 11:00 AM EDT by telephone and verified that I am speaking with the correct person using two identifiers.  Location: Patient: home Provider: Clinic   I discussed the limitations, risks, security and privacy concerns of performing an evaluation and management service by telephone and the availability of in person appointments. I also discussed with the patient that there may be a patient responsible charge related to this service. The patient expressed understanding and agreed to proceed.   I provided 13 minutes of non-face-to-face time during this encounter.      Patient Identification: [name] MRN:  [id number] Date of Evaluation:  08/25/2020    Chief Complaint:   " I am feeling little bit better." Visit Diagnosis:    ICD-10-CM   1. MDD (major depressive disorder), recurrent, in partial remission (HCC)  F33.41 DULoxetine (CYMBALTA) 60 MG capsule    traZODone (DESYREL) 100 MG tablet  2. 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 DULoxetine (CYMBALTA) 60 MG capsule  3. MDD (major depressive disorder), recurrent episode, moderate (HCC)  F33.1     History of Present Illness: This is a 49 year old female with history of MDD, grief, type 2 diabetes mellitus, hypertension, migraine, rheumatoid arthritis, chronic bilateral knee osteoarthritis now contacted for follow-up. Patient reported that she is doing well this week because her daughter is coming to visit her from [place] along with her new baby that was born in January.  Patient stated that she has not seen the grandbaby since the baby was born and she is very excited that they are coming today for a visit. She stated that she found increasing the dose of Cymbalta to be helpful with her depression symptoms.  She is sleeping better after her dose of trazodone was increased.  She still has crying spells when she thinks  about her deceased son and husband. She stated that overall things are progressing okay for now and she wants to continue the same regimen at present.  Past Psychiatric History: MDD, grief with prolonged bereavement reaction  Previous Psychotropic Medications: Yes -Celexa, Lexapro, Cymbalta  Substance Abuse History in the last 12 months:  No.  Consequences of Substance Abuse: Negative  Past Medical History:  Past Medical History:  Diagnosis Date  . Anxiety   . Common migraine with intractable migraine 10/02/2016  . Depression   . Diabetes mellitus   . Hypertension     Past Surgical History:  Procedure Laterality Date  . CHOLECYSTECTOMY    . TUBAL LIGATION      Family Psychiatric History: Denied any family psychiatric history  Family History:  Family History  Problem Relation Age of Onset  . Diabetes Mother   . Hypertension Mother   . Renal Disease Mother   . Cancer Father   . Diabetes Sister   . Diabetes Brother   . Renal Disease Brother   . Diabetes Brother   . Diabetes Sister   . Diabetes Brother   . Diabetes Brother   . Diabetes Brother   . Healthy Daughter   . Healthy Daughter     Social History:   Social History   Socioeconomic History  . Marital status: Single    Spouse name: Not on file  . Number of children: Not on file  . Years of education: Not on file  . Highest education level: Not on file  Occupational History  . Not on  file  Tobacco Use  . Smoking status: Current Every Day Smoker    Packs/day: 0.25    Types: Cigarettes  . Smokeless tobacco: Never Used  Vaping Use  . Vaping Use: Former  Substance and Sexual Activity  . Alcohol use: No  . Drug use: No  . Sexual activity: Yes    Birth control/protection: None  Other Topics Concern  . Not on file  Social History Narrative   Right handed    Lives with daughter   Caffeine use: Drinks coffee/tea/soda sometimes   Social Determinants of Health   Financial Resource Strain: Not on file   Food Insecurity: Not on file  Transportation Needs: Not on file  Physical Activity: Not on file  Stress: Not on file  Social Connections: Not on file    Additional Social History: Lives by herself, family helps with bills, has applied for disability.  Allergies:   Allergies  Allergen Reactions  . Lisinopril     angioedema    Metabolic Disorder Labs: Lab Results  Component Value Date   HGBA1C 8.3 (A) 08/13/2020   No results found for: PROLACTIN Lab Results  Component Value Date   CHOL 185 08/01/2018   TRIG 102 08/01/2018   HDL 40 08/01/2018   CHOLHDL 4.6 (H) 08/01/2018   LDLCALC 125 (H) 08/01/2018   LDLCALC 98 08/17/2016   Lab Results  Component Value Date   TSH 2.870 10/02/2018    Therapeutic Level Labs: No results found for: LITHIUM No results found for: CBMZ No results found for: VALPROATE  Current Medications: Current Outpatient Medications  Medication Sig Dispense Refill  . Accu-Chek Softclix Lancets lancets Use as instructed 100 each 12  . amLODipine (NORVASC) 10 MG tablet Take 1 tablet (10 mg total) by mouth daily. 90 tablet 3  . aspirin EC 81 MG tablet Take 81 mg by mouth every 6 (six) hours as needed for mild pain.     . blood glucose meter kit and supplies Dispense based on patient and insurance preference. Use up to four times daily as directed. (FOR ICD-10 E10.9, E11.9). 1 each 0  . DULoxetine (CYMBALTA) 60 MG capsule Take 1 capsule (60 mg total) by mouth 2 (two) times daily. 180 capsule 0  . fluticasone (FLONASE) 50 MCG/ACT nasal spray Place 1 spray into both nostrils daily. 15 mL 2  . fluticasone (FLOVENT HFA) 44 MCG/ACT inhaler Inhale 2 puffs into the lungs 2 (two) times daily. 1 Inhaler 12  . gabapentin (NEURONTIN) 300 MG capsule TAKE 1 CAPSULE (300 MG TOTAL) BY MOUTH 3 (THREE) TIMES DAILY. 90 capsule 1  . glucose blood (ACCU-CHEK AVIVA PLUS) test strip Use as instructed 100 each 12  . ibuprofen (ADVIL) 800 MG tablet Take 1 tablet (800 mg total)  by mouth every 8 (eight) hours as needed. 90 tablet 0  . insulin detemir (LEVEMIR FLEXTOUCH) 100 UNIT/ML FlexPen Inject 50 units twice a day after BKF and dinner 30 mL 3  . Insulin Pen Needle 31G X 8 MM MISC Use to inject Levemir BID. 100 each 2  . Lancets (ACCU-CHEK SOFT TOUCH) lancets Use as instructed 100 each 12  . losartan (COZAAR) 50 MG tablet Take 1 tablet (50 mg total) by mouth daily. 90 tablet 1  . metFORMIN (GLUCOPHAGE) 1000 MG tablet Take 1 tablet (1,000 mg total) by mouth 2 (two) times daily with a meal. 180 tablet 1  . methocarbamol (ROBAXIN) 500 MG tablet Take 1 tablet (500 mg total) by mouth every 8 (eight)  hours as needed for muscle spasms. 90 tablet 1  . pantoprazole (PROTONIX) 40 MG tablet TAKE 1 TABLET (40 MG TOTAL) BY MOUTH DAILY. 30 tablet 3  . pravastatin (PRAVACHOL) 10 MG tablet TAKE 1 TABLET (10 MG TOTAL) BY MOUTH DAILY. 90 tablet 1  . PROVENTIL HFA 108 (90 Base) MCG/ACT inhaler INHALE 1 TO 2 PUFFS BY MOUTH EVERY 6 (SIX) HOURS AS NEEDED FOR WHEEZING OR SHORTNESS OF BREATH. (Patient taking differently: Inhale 1-2 puffs into the lungs every 6 (six) hours as needed for wheezing or shortness of breath.) 6.7 g 1  . traZODone (DESYREL) 100 MG tablet Take 1 tablet (100 mg total) by mouth at bedtime. 90 tablet 0  . TRULICITY 1.5 DV/4.4ZH SOPN INJECT 1.5 MG INTO THE SKIN ONCE A WEEK. 2 mL 2   No current facility-administered medications for this visit.     Psychiatric Specialty Exam: Review of Systems  There were no vitals taken for this visit.There is no height or weight on file to calculate BMI.  General Appearance: Unable to assess due to phone visit  Eye Contact:  Unable to assess due to phone visit  Speech:  Clear and Coherent and Slow  Volume:  Normal  Mood:  Euthymic  Affect:  Congruent  Thought Process:  Goal Directed and Descriptions of Associations: Intact  Orientation:  Full (Time, Place, and Person)  Thought Content:  Logical  Suicidal Thoughts:  No  Homicidal  Thoughts:  No  Memory:  Immediate;   Good Recent;   Good Remote;   Good  Judgement:  Fair  Insight:  Fair  Psychomotor Activity:  Normal  Concentration:  Concentration: Good and Attention Span: Good  Recall:  Good  Fund of Knowledge:Good  Language: Good  Akathisia:  Negative  Handed:  Right  AIMS (if indicated):  not done  Assets:  Communication Skills Desire for Improvement Financial Resources/Insurance Housing Social Support  ADL's:  Intact  Cognition: WNL  Sleep:  Fair   Screenings: GAD-7   [place] Office Visit from 06/17/2020 in [place] Office Visit from 05/06/2020 in [place] Office Visit from 03/09/2020 in [facility] Office Visit from 01/05/2020 in [place] Office Visit from 10/01/2019 in [place]  Total GAD-7 Score _0 PHQ2-9   Flowsheet Row Video Visit from 08/25/2020 in [facility] Video Visit from 07/09/2020 in [facility] Office Visit from 06/17/2020 in [place] Video Visit from 05/13/2020 in [facility] Office Visit from 05/06/2020 in [place]  PHQ-2 Total Score _1 PHQ-9 Total Score _2 Flowsheet Row Video Visit from 08/25/2020 in [facility] Video Visit from 07/09/2020 in [facility] Office Visit from 06/17/2020 in [place] Error: Q3, 4, or 5 should not be populated when Q2 is No Error: Q3, 4, or 5 should not be populated when Q2 is No Error: Q3, 4, or 5 should not be populated when Q2 is No      Assessment and Plan: Patient reported that she is feeling a little bit better for now.  She would like to continue the same regimen for now.  1. MDD (major depressive disorder),  recurrent, in partial  remission (HCC)  - DULoxetine (CYMBALTA) 60 MG capsule; Take 1 capsule (60 mg total) by mouth 2 (two) times daily.  Dispense: 180 capsule; Refill: 0 - traZODone (DESYREL) 100 MG tablet; Take 1 tablet (100 mg total) by mouth at bedtime.  Dispense: 90 tablet; Refill: 0  2. Grief reaction with prolonged bereavement  - DULoxetine (CYMBALTA) 60 MG capsule; Take 1 capsule (60 mg total) by mouth 2 (two) times daily.  Dispense: 180 capsule; Refill: 0   Continue same medication regimen. Follow up in 3 months.    [place] Crane, MD 5/18/202211:15 AM

## 2020-09-09 ENCOUNTER — Other Ambulatory Visit (INDEPENDENT_AMBULATORY_CARE_PROVIDER_SITE_OTHER): Payer: Self-pay | Admitting: Primary Care

## 2020-09-09 DIAGNOSIS — K219 Gastro-esophageal reflux disease without esophagitis: Secondary | ICD-10-CM

## 2020-09-30 ENCOUNTER — Other Ambulatory Visit (INDEPENDENT_AMBULATORY_CARE_PROVIDER_SITE_OTHER): Payer: Self-pay | Admitting: Primary Care

## 2020-09-30 DIAGNOSIS — G894 Chronic pain syndrome: Secondary | ICD-10-CM

## 2020-09-30 MED ORDER — IBUPROFEN 800 MG PO TABS: 800.0000 mg | ORAL_TABLET | Freq: Three times a day (TID) | ORAL | 0 refills | Status: DC | PRN

## 2020-09-30 NOTE — Telephone Encounter (Signed)
Copied from CRM [id number]. Topic: Quick Communication - Rx Refill/Question >> Sep 30, 2020 11:33 AM Gaetana Michaelis A wrote: Medication: ibuprofen (ADVIL) 800 MG tablet   Has the patient contacted their pharmacy? No. (Agent: If no, request that the patient contact the pharmacy for the refill.) (Agent: If yes, when and what did the pharmacy advise?)  Preferred Pharmacy (with phone number or street name): [facility] - [place], [place] - [address]  Phone:  [phone] Fax:  [phone]   Agent: Please be advised that RX refills may take up to 3 business days. We ask that you follow-up with your pharmacy.

## 2020-10-18 ENCOUNTER — Telehealth (INDEPENDENT_AMBULATORY_CARE_PROVIDER_SITE_OTHER): Payer: Self-pay | Admitting: Primary Care

## 2020-10-18 ENCOUNTER — Other Ambulatory Visit (INDEPENDENT_AMBULATORY_CARE_PROVIDER_SITE_OTHER): Payer: Self-pay | Admitting: Primary Care

## 2020-10-18 DIAGNOSIS — E119 Type 2 diabetes mellitus without complications: Secondary | ICD-10-CM

## 2020-10-18 NOTE — Telephone Encounter (Signed)
Patient will need new Rx for these lancets- can not order from medication list. Request sent to office for new Rx.

## 2020-10-18 NOTE — Telephone Encounter (Signed)
Medication Refill - Medication: accu-chek softclix lancets  Has the patient contacted their pharmacy? Yes.   Per pt pharm said she needs new rx  Preferred Pharmacy (with phone number or street name[facility] [place] phone [phone]  Agent: Please be advised that RX refills may take up to 3 business days. We ask that you follow-up with your pharmacy.

## 2020-10-18 NOTE — Telephone Encounter (Signed)
Sent to PCP to refill if appropriate.  

## 2020-10-26 ENCOUNTER — Other Ambulatory Visit (INDEPENDENT_AMBULATORY_CARE_PROVIDER_SITE_OTHER): Payer: Self-pay | Admitting: Primary Care

## 2020-10-26 DIAGNOSIS — E119 Type 2 diabetes mellitus without complications: Secondary | ICD-10-CM

## 2020-10-26 MED ORDER — ACCU-CHEK GUIDE VI STRP: 1.0000 | ORAL_STRIP | Freq: Three times a day (TID) | 12 refills | Status: DC

## 2020-10-26 NOTE — Telephone Encounter (Signed)
Pt called stating that she is still needing to have these. She states that she has been out of these for 3 weeks and has not been able  to check her sugar levels. Please advise .

## 2020-11-03 ENCOUNTER — Other Ambulatory Visit (INDEPENDENT_AMBULATORY_CARE_PROVIDER_SITE_OTHER): Payer: Self-pay | Admitting: Primary Care

## 2020-11-03 DIAGNOSIS — M2550 Pain in unspecified joint: Secondary | ICD-10-CM

## 2020-11-03 NOTE — Telephone Encounter (Signed)
   Notes to clinic: medication filled today Patient due for follow up 11/14/2020 Review for refills   Requested Prescriptions  Pending Prescriptions Disp Refills   gabapentin (NEURONTIN) 300 MG capsule [Pharmacy Med Name: GABAPENTIN 300 MG ORAL CAPSULE] 90 capsule 1    Sig: TAKE 1 CAPSULE (300 MG TOTAL) BY MOUTH 3 (THREE) TIMES DAILY.      Neurology: Anticonvulsants - gabapentin Passed - 11/03/2020 10:38 AM      Passed - Valid encounter within last 12 months    Recent Outpatient Visits           2 months ago Type 2 diabetes mellitus without complication, without long-term current use of insulin (HCC)   [facility] Gwinda Passe P, NP   4 months ago Arthralgia, unspecified joint   [facility] Grayce Sessions, NP   4 months ago Type 2 diabetes mellitus without complication, with long-term current use of insulin [facility])   [facility] [place], Jeannett Senior L, RPH-CPP   5 months ago Type 2 diabetes mellitus without complication, with long-term current use of insulin [facility])   [facility] [place], Jeannett Senior L, RPH-CPP   6 months ago Type 2 diabetes mellitus without complication, without long-term current use of insulin (HCC)   [facility] Grayce Sessions, NP

## 2020-11-04 ENCOUNTER — Other Ambulatory Visit (INDEPENDENT_AMBULATORY_CARE_PROVIDER_SITE_OTHER): Payer: Self-pay | Admitting: Primary Care

## 2020-11-05 ENCOUNTER — Ambulatory Visit (HOSPITAL_COMMUNITY)
Admission: EM | Admit: 2020-11-05 | Discharge: 2020-11-05 | Disposition: A | Discharge status: Home / Self Care | Payer: Medicaid Other | Attending: Medical Oncology | Admitting: Medical Oncology

## 2020-11-05 ENCOUNTER — Other Ambulatory Visit: Payer: Self-pay

## 2020-11-05 ENCOUNTER — Encounter (HOSPITAL_COMMUNITY): Payer: Self-pay | Admitting: Emergency Medicine

## 2020-11-05 DIAGNOSIS — H6593 Unspecified nonsuppurative otitis media, bilateral: Secondary | ICD-10-CM | POA: Diagnosis not present

## 2020-11-05 DIAGNOSIS — R059 Cough, unspecified: Secondary | ICD-10-CM

## 2020-11-05 MED ORDER — BENZONATATE 200 MG PO CAPS: 200.0000 mg | ORAL_CAPSULE | Freq: Three times a day (TID) | ORAL | 0 refills | Status: DC | PRN

## 2020-11-05 MED ORDER — ALBUTEROL SULFATE HFA 108 (90 BASE) MCG/ACT IN AERS: 1.0000 | INHALATION_SPRAY | Freq: Four times a day (QID) | RESPIRATORY_TRACT | 0 refills | Status: DC | PRN

## 2020-11-05 MED ORDER — FLUTICASONE PROPIONATE 50 MCG/ACT NA SUSP: 2.0000 | Freq: Every day | NASAL | 0 refills | Status: DC

## 2020-11-05 MED ORDER — PROMETHAZINE-DM 6.25-15 MG/5ML PO SYRP: 5.0000 mL | ORAL_SOLUTION | Freq: Four times a day (QID) | ORAL | 0 refills | Status: DC | PRN

## 2020-11-05 NOTE — ED Provider Notes (Signed)
[place]    CSN: [id number] Arrival date & time: 11/05/20  2353      History   Chief Complaint Chief Complaint  Patient presents with   Cough   Otalgia    HPI [name] is a 50 y.o. female.   HPI  Cold Symptoms: Patient states that they have had a dry and productive cough with yellow sputum, ear pain and sore throat for the past week.  They have tried NyQuil, Mucinex, over-the-counter allergy medication all without much relief.  They have not had any fevers, shortness of breath or wheezing. Albuterol has helped in the past. She states that she has tried tessalon 100 mg in the past without improvement- she asks for something stronger.    Past Medical History:  Diagnosis Date   Anxiety    Common migraine with intractable migraine 10/02/2016   Depression    Diabetes mellitus    Hypertension     Patient Active Problem List   Diagnosis Date Noted   MDD (major depressive disorder), recurrent episode, moderate ([place]) 05/13/2020   Grief reaction with prolonged bereavement 05/13/2020   Hot flashes 10/02/2018   Vaginal dryness 10/02/2018   BIH (benign intracranial hypertension) 11/06/2017   Somnolence, daytime 07/30/2017   Snoring 07/30/2017   Morbid obesity ([place]) 07/30/2017   Migraine 10/02/2016   Common migraine with intractable migraine 10/02/2016   Type 2 diabetes mellitus without complication, without long-term current use of insulin ([place]) 08/17/2016    Past Surgical History:  Procedure Laterality Date   CHOLECYSTECTOMY     TUBAL LIGATION      OB History     Gravida  5   Para  3   Term  3   Preterm      AB  2   Living  3      SAB  1   IAB  1   Ectopic      Multiple      Live Births               Home Medications    Prior to Admission medications   Medication Sig Start Date End Date Taking? Authorizing Provider  insulin detemir (LEVEMIR FLEXTOUCH) 100 UNIT/ML FlexPen INJECT 50 UNITS UNDER THE SKIN TWICE A DAY AFTER  BREAKFAST AND DINNER 11/04/20   Kerin Perna, NP  Accu-Chek Softclix Lancets lancets Use as instructed 12/29/19   Kerin Perna, NP  amLODipine (NORVASC) 10 MG tablet Take 1 tablet (10 mg total) by mouth daily. 05/06/20   Kerin Perna, NP  aspirin EC 81 MG tablet Take 81 mg by mouth every 6 (six) hours as needed for mild pain.     [provider]  blood glucose meter kit and supplies Dispense based on patient and insurance preference. Use up to four times daily as directed. (FOR ICD-10 E10.9, E11.9). 01/05/20   Kerin Perna, NP  DULoxetine (CYMBALTA) 60 MG capsule Take 1 capsule (60 mg total) by mouth 2 (two) times daily. 08/25/20   [place]ev[place] Crane, MD  fluticasone (FLONASE) 50 MCG/ACT nasal spray Place 1 spray into both nostrils daily. 04/13/20   H[place]el Sams, PA-C  fluticasone (FLOVENT HFA) 44 MCG/ACT inhaler Inhale 2 puffs into the lungs 2 (two) times daily. 01/19/19   Kerin Perna, NP  gabapentin (NEURONTIN) 300 MG capsule TAKE 1 CAPSULE (300 MG TOTAL) BY MOUTH 3 (THREE) TIMES DAILY. 11/04/20   Kerin Perna, NP  glucose blood (ACCU-CHEK GUIDE) test  strip 1 each by Other route 3 (three) times daily after meals. Use as instructed 10/26/20   Kerin Perna, NP  ibuprofen (ADVIL) 800 MG tablet Take 1 tablet (800 mg total) by mouth every 8 (eight) hours as needed. 09/30/20   Kerin Perna, NP  Insulin Pen Needle 31G X 8 MM MISC Use to inject Levemir BID. 05/21/20   Charlott Rakes, MD  Lancets (ACCU-CHEK SOFT TOUCH) lancets Use as instructed 12/29/19   Kerin Perna, NP  losartan (COZAAR) 50 MG tablet Take 1 tablet (50 mg total) by mouth daily. 05/06/20   Kerin Perna, NP  metFORMIN (GLUCOPHAGE) 1000 MG tablet Take 1 tablet (1,000 mg total) by mouth 2 (two) times daily with a meal. 06/17/20   Kerin Perna, NP  methocarbamol (ROBAXIN) 500 MG tablet Take 1 tablet (500 mg total) by mouth every 8 (eight) hours as needed for muscle spasms.  03/09/20   Kerin Perna, NP  pantoprazole (PROTONIX) 40 MG tablet TAKE 1 TABLET (40 MG TOTAL) BY MOUTH DAILY. 09/09/20   Kerin Perna, NP  pravastatin (PRAVACHOL) 10 MG tablet TAKE 1 TABLET (10 MG TOTAL) BY MOUTH DAILY. 08/10/20   Kerin Perna, NP  PROVENTIL HFA 108 (90 Base) MCG/ACT inhaler INHALE 1 TO 2 PUFFS BY MOUTH EVERY 6 (SIX) HOURS AS NEEDED FOR WHEEZING OR SHORTNESS OF BREATH. Patient taking differently: Inhale 1-2 puffs into the lungs every 6 (six) hours as needed for wheezing or shortness of breath. 01/10/19   Kerin Perna, NP  traZODone (DESYREL) 100 MG tablet Take 1 tablet (100 mg total) by mouth at bedtime. 08/25/20   [place] Crane, MD  TRULICITY 1.5 NK/5.3ZJ SOPN INJECT 1.5 MG INTO THE SKIN ONCE A WEEK. 08/16/20   Kerin Perna, NP    Family History Family History  Problem Relation Age of Onset   Diabetes Mother    Hypertension Mother    Renal Disease Mother    Cancer Father    Diabetes Sister    Diabetes Brother    Renal Disease Brother    Diabetes Brother    Diabetes Sister    Diabetes Brother    Diabetes Brother    Diabetes Brother    Healthy Daughter    Healthy Daughter     Social History Social History   Tobacco Use   Smoking status: Every Day    Packs/day: 0.25    Types: Cigarettes   Smokeless tobacco: Never  Vaping Use   Vaping Use: Former  Substance Use Topics   Alcohol use: No   Drug use: No     Allergies   Lisinopril   Review of Systems Review of Systems  As stated above in HPI Physical Exam Triage Vital Signs ED Triage Vitals  Enc Vitals Group     BP 11/05/20 1023 (!) 158/98     Pulse Rate 11/05/20 1023 75     Resp 11/05/20 1023 14     Temp 11/05/20 1023 98.4 F (36.9 C)     Temp Source 11/05/20 1023 Oral     SpO2 11/05/20 1023 97 %     Weight --      Height --      Head Circumference --      Peak Flow --      Pain Score 11/05/20 1027 10     Pain Loc --      Pain Edu? --      Excl. in [place]? --  No data found.  Updated Vital Signs BP (!) 158/98 (BP Location: Left Arm)   Pulse 75   Temp 98.4 F (36.9 C) (Oral)   Resp 14   SpO2 97%   Physical Exam Vitals and nursing note reviewed.  Constitutional:      General: She is not in acute distress.    Appearance: Normal appearance. She is not ill-appearing, toxic-appearing or diaphoretic.  HENT:     Head: Normocephalic and atraumatic.     Right Ear: Tympanic membrane normal. There is no impacted cerumen.     Left Ear: Tympanic membrane normal. There is no impacted cerumen.     Nose: Congestion and rhinorrhea present.     Mouth/Throat:     Mouth: Mucous membranes are moist.     Pharynx: Oropharynx is clear. No oropharyngeal exudate or posterior oropharyngeal erythema.  Eyes:     Extraocular Movements: Extraocular movements intact.     Pupils: Pupils are equal, round, and reactive to light.  Cardiovascular:     Rate and Rhythm: Normal rate and regular rhythm.     Heart sounds: Normal heart sounds.  Pulmonary:     Effort: Pulmonary effort is normal.     Breath sounds: Normal breath sounds.  Musculoskeletal:     Cervical back: Normal range of motion and neck supple.  Lymphadenopathy:     Cervical: No cervical adenopathy.  Skin:    General: Skin is warm.  Neurological:     Mental Status: She is alert and oriented to person, place, and time.     UC Treatments / Results  Labs (all labs ordered are listed, but only abnormal results are displayed) Labs Reviewed - No data to display  EKG   Radiology No results found.  Procedures Procedures (including critical care time)  Medications Ordered in UC Medications - No data to display  Initial Impression / Assessment and Plan / UC Course  I have reviewed the triage vital signs and the nursing notes.  Pertinent labs & imaging results that were available during my care of the patient were reviewed by me and considered in my medical decision making (see chart for  details).     New. Treating with tessalon, syrup, albuterol, flonase. Rest, hydration with water. Discussed red flag signs and symptoms.    Final Clinical Impressions(s) / UC Diagnoses   Final diagnoses:  None   Discharge Instructions   None    ED Prescriptions   None    PDMP not reviewed this encounter.   Hughie Closs, [place] 11/05/20 1113

## 2020-11-05 NOTE — ED Triage Notes (Signed)
Productive Cough, yellow sputum, bilateral ear pain, x 1 week. Tried nyquil, mucinex, "allergy pills" with no relief. Denies fever, aggravating and relieving factors.

## 2020-11-11 ENCOUNTER — Other Ambulatory Visit: Payer: Self-pay | Admitting: Primary Care

## 2020-11-11 DIAGNOSIS — Z794 Long term (current) use of insulin: Secondary | ICD-10-CM

## 2020-11-11 NOTE — Telephone Encounter (Signed)
Sent to PCP to refill if appropriate.  

## 2020-11-23 ENCOUNTER — Other Ambulatory Visit: Payer: Self-pay

## 2020-11-23 ENCOUNTER — Encounter (HOSPITAL_COMMUNITY): Payer: Self-pay | Admitting: Psychiatry

## 2020-11-23 ENCOUNTER — Telehealth (INDEPENDENT_AMBULATORY_CARE_PROVIDER_SITE_OTHER): Payer: Medicaid Other | Admitting: Psychiatry

## 2020-11-23 DIAGNOSIS — F4329 Adjustment disorder with other symptoms: Secondary | ICD-10-CM

## 2020-11-23 DIAGNOSIS — F3341 Major depressive disorder, recurrent, in partial remission: Secondary | ICD-10-CM

## 2020-11-23 DIAGNOSIS — F4381 Prolonged grief disorder: Secondary | ICD-10-CM

## 2020-11-23 MED ORDER — TRAZODONE HCL 100 MG PO TABS: 100.0000 mg | ORAL_TABLET | Freq: Every day | ORAL | 2 refills | Status: DC

## 2020-11-23 MED ORDER — DULOXETINE HCL 60 MG PO CPEP: 60.0000 mg | ORAL_CAPSULE | Freq: Two times a day (BID) | ORAL | 2 refills | Status: DC

## 2020-11-23 NOTE — Progress Notes (Signed)
[place] MD/PA/NP OP Progress Note Virtual Visit via Telephone Note  I connected with [name] on 11/23/20 at  3:00 PM EDT by telephone and verified that I am speaking with the correct person using two identifiers.  Location: Patient: home Provider: Clinic   I discussed the limitations, risks, security and privacy concerns of performing an evaluation and management service by telephone and the availability of in person appointments. I also discussed with the patient that there may be a patient responsible charge related to this service. The patient expressed understanding and agreed to proceed.   I provided 30 minutes of non-face-to-face time during this encounter.  11/23/2020 3:20 PM [name]  MRN:  [id number]  Chief Complaint: "The medications work"  HPI: 50 year old female seen today for follow-up psychiatric evaluation.  She is a former patient of Dr. Jean Rosenthal is being transferred to writer for medication management.  She has a psychiatric history of depression and prolonged grief.  Currently she is managed on trazodone 100 mg nightly and Cymbalta 60 mg twice daily.  She notes her medications are effective in managing her psychiatric conditions.  Today patient was unable to logon virtually so assessment was done over the phone.  During exam she was pleasant, cooperative, and engaged in conversation.  She informed Probation officer that since her last visit she has been more anxious and depressed.  She notes that the anniversary of her son's death was this week 06/27/2022.  She informed Probation officer that her family honored the passing of her son by having a cookout and supporting her.  Today provider conducted a GAD-7 and patient scored an 18.  Provider also conducted a PHQ-9 and patient scored a 14.  She endorses adequate sleep and appetite.  She notes that she has lost some weight but attributes it to recent changes in her diabetic medications.  Today she denies SI/HI/AVH, mania, or paranoia.  Patient  informed Probation officer that her daughter and her new grandchild recently moved from [place].  She notes that she is enjoying spending time with her daughter and grandchild.  Patient informed Probation officer that she is able to cope with above stress and request that her medications not be changed today.  No medication changes made today.  Patient agreeable to continue medications as prescribed. Visit Diagnosis:    ICD-10-CM   1. MDD (major depressive disorder), recurrent, in partial remission (HCC)  F33.41 DULoxetine (CYMBALTA) 60 MG capsule    traZODone (DESYREL) 100 MG tablet    2. 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 DULoxetine (CYMBALTA) 60 MG capsule      Past Psychiatric History:  MDD, grief with prolonged bereavement reaction  Past Medical History:  Past Medical History:  Diagnosis Date   Anxiety    Common migraine with intractable migraine 10/02/2016   Depression    Diabetes mellitus    Hypertension     Past Surgical History:  Procedure Laterality Date   CHOLECYSTECTOMY     TUBAL LIGATION      Family Psychiatric History: Denied any family psychiatric history  Family History:  Family History  Problem Relation Age of Onset   Diabetes Mother    Hypertension Mother    Renal Disease Mother    Cancer Father    Diabetes Sister    Diabetes Brother    Renal Disease Brother    Diabetes Brother    Diabetes Sister    Diabetes Brother    Diabetes Brother    Diabetes Brother    Healthy  Daughter    Healthy Daughter     Social History:  Social History   Socioeconomic History   Marital status: Single    Spouse name: Not on file   Number of children: Not on file   Years of education: Not on file   Highest education level: Not on file  Occupational History   Not on file  Tobacco Use   Smoking status: Every Day    Packs/day: 0.25    Types: Cigarettes   Smokeless tobacco: Never  Vaping Use   Vaping Use: Former  Substance and Sexual Activity   Alcohol use: No    Drug use: No   Sexual activity: Yes    Birth control/protection: None  Other Topics Concern   Not on file  Social History Narrative   Right handed    Lives with daughter   Caffeine use: Drinks coffee/tea/soda sometimes   Social Determinants of Radio broadcast assistant Strain: Not on file  Food Insecurity: Not on file  Transportation Needs: Not on file  Physical Activity: Not on file  Stress: Not on file  Social Connections: Not on file    Allergies:  Allergies  Allergen Reactions   Lisinopril     angioedema    Metabolic Disorder Labs: Lab Results  Component Value Date   HGBA1C 8.3 (A) 08/13/2020   No results found for: PROLACTIN Lab Results  Component Value Date   CHOL 185 08/01/2018   TRIG 102 08/01/2018   HDL 40 08/01/2018   CHOLHDL 4.6 (H) 08/01/2018   LDLCALC 125 (H) 08/01/2018   LDLCALC 98 08/17/2016   Lab Results  Component Value Date   TSH 2.870 10/02/2018   TSH 4.840 (H) 01/09/2018    Therapeutic Level Labs: No results found for: LITHIUM No results found for: VALPROATE No components found for:  CBMZ  Current Medications: Current Outpatient Medications  Medication Sig Dispense Refill   insulin detemir (LEVEMIR FLEXTOUCH) 100 UNIT/ML FlexPen INJECT 50 UNITS UNDER THE SKIN TWICE A DAY AFTER BREAKFAST AND DINNER 30 mL 0   Accu-Chek Softclix Lancets lancets Use as instructed 100 each 12   albuterol (VENTOLIN HFA) 108 (90 Base) MCG/ACT inhaler Inhale 1-2 puffs into the lungs every 6 (six) hours as needed for wheezing or shortness of breath. 8 each 0   amLODipine (NORVASC) 10 MG tablet Take 1 tablet (10 mg total) by mouth daily. 90 tablet 3   aspirin EC 81 MG tablet Take 81 mg by mouth every 6 (six) hours as needed for mild pain.      benzonatate (TESSALON) 200 MG capsule Take 1 capsule (200 mg total) by mouth 3 (three) times daily as needed for cough. 20 capsule 0   blood glucose meter kit and supplies Dispense based on patient and insurance  preference. Use up to four times daily as directed. (FOR ICD-10 E10.9, E11.9). 1 each 0   DULoxetine (CYMBALTA) 60 MG capsule Take 1 capsule (60 mg total) by mouth 2 (two) times daily. 90 capsule 2   fluticasone (FLONASE) 50 MCG/ACT nasal spray Place 2 sprays into both nostrils daily. 16 mL 0   fluticasone (FLOVENT HFA) 44 MCG/ACT inhaler Inhale 2 puffs into the lungs 2 (two) times daily. 1 Inhaler 12   gabapentin (NEURONTIN) 300 MG capsule TAKE 1 CAPSULE (300 MG TOTAL) BY MOUTH 3 (THREE) TIMES DAILY. 90 capsule 0   glucose blood (ACCU-CHEK GUIDE) test strip 1 each by Other route 3 (three) times daily after meals. Use as  instructed 100 each 12   ibuprofen (ADVIL) 800 MG tablet Take 1 tablet (800 mg total) by mouth every 8 (eight) hours as needed. 90 tablet 0   Insulin Pen Needle 31G X 8 MM MISC Use to inject Levemir BID. 100 each 2   Lancets (ACCU-CHEK SOFT TOUCH) lancets Use as instructed 100 each 12   losartan (COZAAR) 50 MG tablet Take 1 tablet (50 mg total) by mouth daily. 90 tablet 1   metFORMIN (GLUCOPHAGE) 1000 MG tablet Take 1 tablet (1,000 mg total) by mouth 2 (two) times daily with a meal. 180 tablet 1   methocarbamol (ROBAXIN) 500 MG tablet Take 1 tablet (500 mg total) by mouth every 8 (eight) hours as needed for muscle spasms. 90 tablet 1   pantoprazole (PROTONIX) 40 MG tablet TAKE 1 TABLET (40 MG TOTAL) BY MOUTH DAILY. 30 tablet 3   pravastatin (PRAVACHOL) 10 MG tablet TAKE 1 TABLET (10 MG TOTAL) BY MOUTH DAILY. 90 tablet 1   promethazine-dextromethorphan (PROMETHAZINE-DM) 6.25-15 MG/5ML syrup Take 5 mLs by mouth 4 (four) times daily as needed for cough. 118 mL 0   traZODone (DESYREL) 100 MG tablet Take 1 tablet (100 mg total) by mouth at bedtime. 30 tablet 2   TRULICITY 1.5 ZG/0.1VC SOPN INJECT 1.5 MG INTO THE SKIN ONCE A WEEK. 2 mL 2   No current facility-administered medications for this visit.     Musculoskeletal: Strength & Muscle Tone:  Unable to assess due to telephone  visit Gait & Station:  Unable to assess due to telephone visit Patient leans: N/A  Psychiatric Specialty Exam: Review of Systems  There were no vitals taken for this visit.There is no height or weight on file to calculate BMI.  General Appearance:  Unable to assess due to telephone visit  Eye Contact:   Unable to assess due to telephone visit  Speech:  Clear and Coherent and Normal Rate  Volume:  Normal  Mood:  Anxious and Depressed  Affect:   Unable to assess due to telephone visit  Thought Process:  Coherent, Goal Directed, and Linear  Orientation:  Full (Time, Place, and Person)  Thought Content: WDL and Logical   Suicidal Thoughts:  No  Homicidal Thoughts:  No  Memory:  Immediate;   Good Recent;   Good Remote;   Good  Judgement:  Good  Insight:  Good  Psychomotor Activity:   Unable to assess due to telephone visit  Concentration:  Concentration: Good and Attention Span: Good  Recall:  Good  Fund of Knowledge: Good  Language: Good  Akathisia:  No  Handed:  Right  AIMS (if indicated): not done  Assets:  Communication Skills Desire for Improvement Financial Resources/Insurance Housing Leisure Time Physical Health Social Support  ADL's:  Intact  Cognition: WNL  Sleep:  Good   Screenings: GAD-7    Flowsheet Row Video Visit from 11/23/2020 in [facility] Office Visit from 06/17/2020 in [place] Office Visit from 05/06/2020 in [place] Office Visit from 03/09/2020 in [place] Office Visit from 01/05/2020 in [place]  Total GAD-7 Score '18 19 7 18 13      ' PHQ2-9    Flowsheet Row Video Visit from 11/23/2020 in [facility] Video Visit from 08/25/2020 in [facility] Video Visit from 07/09/2020 in [facility] Office Visit from 06/17/2020 in [place]  CTR Video Visit from 05/13/2020 in [facility]  PHQ-2 Total Score '4 2 4 4 6  ' PHQ-9 Total Score '14 5 15 18 22      ' Flowsheet Row Video Visit from 11/23/2020 in [facility] from 11/05/2020 in [facility] from 08/25/2020 in [place] No Risk No Risk Error: Q3, 4, or 5 should not be populated when Q2 is No        Assessment and Plan: Patient informed writer that she has had increased anxiety and depression due to the recent anniversary of her son's death.  At this time she reports that she is able to cope with these stressors.  No medication changes made today.  Patient agreeable to continue medications as prescribed.  1. MDD (major depressive disorder), recurrent, in partial remission (HCC)  Continue- DULoxetine (CYMBALTA) 60 MG capsule; Take 1 capsule (60 mg total) by mouth 2 (two) times daily.  Dispense: 90 capsule; Refill: 2 Continue- traZODone (DESYREL) 100 MG tablet; Take 1 tablet (100 mg total) by mouth at bedtime.  Dispense: 30 tablet; Refill: 2  2. Grief reaction with prolonged bereavement  Continue- DULoxetine (CYMBALTA) 60 MG capsule; Take 1 capsule (60 mg total) by mouth 2 (two) times daily.  Dispense: 90 capsule; Refill: 2   Follow-up in 3 months Salley Slaughter, NP 11/23/2020, 3:20 PM

## 2020-12-06 ENCOUNTER — Ambulatory Visit (INDEPENDENT_AMBULATORY_CARE_PROVIDER_SITE_OTHER): Payer: Medicaid Other | Admitting: Primary Care

## 2020-12-07 ENCOUNTER — Other Ambulatory Visit (INDEPENDENT_AMBULATORY_CARE_PROVIDER_SITE_OTHER): Payer: Self-pay | Admitting: Primary Care

## 2020-12-07 ENCOUNTER — Other Ambulatory Visit: Payer: Self-pay

## 2020-12-07 ENCOUNTER — Ambulatory Visit (INDEPENDENT_AMBULATORY_CARE_PROVIDER_SITE_OTHER): Payer: Medicaid Other

## 2020-12-07 DIAGNOSIS — E119 Type 2 diabetes mellitus without complications: Secondary | ICD-10-CM

## 2020-12-07 DIAGNOSIS — Z111 Encounter for screening for respiratory tuberculosis: Secondary | ICD-10-CM

## 2020-12-09 ENCOUNTER — Other Ambulatory Visit: Payer: Self-pay

## 2020-12-09 ENCOUNTER — Ambulatory Visit (INDEPENDENT_AMBULATORY_CARE_PROVIDER_SITE_OTHER): Payer: Medicaid Other

## 2020-12-09 DIAGNOSIS — Z111 Encounter for screening for respiratory tuberculosis: Secondary | ICD-10-CM

## 2020-12-09 LAB — TB SKIN TEST
Induration: 0 mm
TB Skin Test: NEGATIVE

## 2020-12-14 ENCOUNTER — Other Ambulatory Visit: Payer: Self-pay | Admitting: Primary Care

## 2020-12-14 DIAGNOSIS — Z794 Long term (current) use of insulin: Secondary | ICD-10-CM

## 2020-12-14 DIAGNOSIS — E119 Type 2 diabetes mellitus without complications: Secondary | ICD-10-CM

## 2020-12-14 NOTE — Telephone Encounter (Signed)
Sent to PCP ?

## 2020-12-22 ENCOUNTER — Ambulatory Visit (INDEPENDENT_AMBULATORY_CARE_PROVIDER_SITE_OTHER): Payer: Medicaid Other | Admitting: Primary Care

## 2021-01-07 ENCOUNTER — Ambulatory Visit (INDEPENDENT_AMBULATORY_CARE_PROVIDER_SITE_OTHER): Payer: Medicaid Other | Admitting: Primary Care

## 2021-01-07 ENCOUNTER — Other Ambulatory Visit: Payer: Self-pay

## 2021-01-07 ENCOUNTER — Other Ambulatory Visit (INDEPENDENT_AMBULATORY_CARE_PROVIDER_SITE_OTHER): Payer: Self-pay | Admitting: Primary Care

## 2021-01-07 ENCOUNTER — Encounter (INDEPENDENT_AMBULATORY_CARE_PROVIDER_SITE_OTHER): Payer: Self-pay | Admitting: Primary Care

## 2021-01-07 VITALS — BP 126/84 | HR 95 | Temp 97.5°F | Ht 66.0 in | Wt 236.4 lb

## 2021-01-07 DIAGNOSIS — E119 Type 2 diabetes mellitus without complications: Secondary | ICD-10-CM | POA: Diagnosis not present

## 2021-01-07 DIAGNOSIS — Z760 Encounter for issue of repeat prescription: Secondary | ICD-10-CM

## 2021-01-07 DIAGNOSIS — G894 Chronic pain syndrome: Secondary | ICD-10-CM | POA: Diagnosis not present

## 2021-01-07 DIAGNOSIS — M2550 Pain in unspecified joint: Secondary | ICD-10-CM

## 2021-01-07 DIAGNOSIS — Z794 Long term (current) use of insulin: Secondary | ICD-10-CM

## 2021-01-07 DIAGNOSIS — I10 Essential (primary) hypertension: Secondary | ICD-10-CM | POA: Diagnosis not present

## 2021-01-07 DIAGNOSIS — E785 Hyperlipidemia, unspecified: Secondary | ICD-10-CM

## 2021-01-07 LAB — POCT GLYCOSYLATED HEMOGLOBIN (HGB A1C): Hemoglobin A1C: 9.4 % — AB (ref 4.0–5.6)

## 2021-01-07 LAB — GLUCOSE, POCT (MANUAL RESULT ENTRY): POC Glucose: 176 mg/dl — AB (ref 70–99)

## 2021-01-07 MED ORDER — IBUPROFEN 800 MG PO TABS: 800.0000 mg | ORAL_TABLET | Freq: Three times a day (TID) | ORAL | 1 refills | Status: DC | PRN

## 2021-01-07 MED ORDER — LEVEMIR FLEXTOUCH 100 UNIT/ML ~~LOC~~ SOPN: PEN_INJECTOR | SUBCUTANEOUS | 0 refills | Status: DC

## 2021-01-07 MED ORDER — PRAVASTATIN SODIUM 10 MG PO TABS: 10.0000 mg | ORAL_TABLET | Freq: Every day | ORAL | 1 refills | Status: DC

## 2021-01-07 MED ORDER — LOSARTAN POTASSIUM 50 MG PO TABS: 50.0000 mg | ORAL_TABLET | Freq: Every day | ORAL | 1 refills | Status: DC

## 2021-01-07 MED ORDER — AMLODIPINE BESYLATE 10 MG PO TABS: 10.0000 mg | ORAL_TABLET | Freq: Every day | ORAL | 3 refills | Status: DC

## 2021-01-07 MED ORDER — METFORMIN HCL 1000 MG PO TABS: ORAL_TABLET | ORAL | 1 refills | Status: DC

## 2021-01-07 NOTE — Telephone Encounter (Signed)
Requested medications are due for refill today NO  Requested medications are on the active medication list NO  Last refill 12/07/20  Last visit 01/07/21  Future visit scheduled No  Notes to clinic Med not on current med list.

## 2021-01-08 LAB — CMP14+EGFR
ALT: 81 IU/L — ABNORMAL HIGH (ref 0–32)
AST: 74 IU/L — ABNORMAL HIGH (ref 0–40)
Albumin/Globulin Ratio: 1.5 (ref 1.2–2.2)
Albumin: 4.6 g/dL (ref 3.8–4.9)
Alkaline Phosphatase: 165 IU/L — ABNORMAL HIGH (ref 44–121)
BUN/Creatinine Ratio: 12 (ref 9–23)
BUN: 10 mg/dL (ref 6–24)
Bilirubin Total: 0.4 mg/dL (ref 0.0–1.2)
CO2: 22 mmol/L (ref 20–29)
Calcium: 9.8 mg/dL (ref 8.7–10.2)
Chloride: 104 mmol/L (ref 96–106)
Creatinine, Ser: 0.83 mg/dL (ref 0.57–1.00)
Globulin, Total: 3 g/dL (ref 1.5–4.5)
Glucose: 175 mg/dL — ABNORMAL HIGH (ref 70–99)
Potassium: 4.1 mmol/L (ref 3.5–5.2)
Sodium: 141 mmol/L (ref 134–144)
Total Protein: 7.6 g/dL (ref 6.0–8.5)
eGFR: 85 mL/min/{1.73_m2} (ref >59)

## 2021-01-08 LAB — LIPID PANEL
Chol/HDL Ratio: 5.3 ratio — ABNORMAL HIGH (ref 0.0–4.4)
Cholesterol, Total: 222 mg/dL — ABNORMAL HIGH (ref 100–199)
HDL: 42 mg/dL (ref >39)
LDL Chol Calc (NIH): 158 mg/dL — ABNORMAL HIGH (ref 0–99)
Triglycerides: 121 mg/dL (ref 0–149)
VLDL Cholesterol Cal: 22 mg/dL (ref 5–40)

## 2021-01-08 LAB — CBC WITH DIFFERENTIAL/PLATELET
Basophils Absolute: 0 10*3/uL (ref 0.0–0.2)
Basos: 0 %
EOS (ABSOLUTE): 0.1 10*3/uL (ref 0.0–0.4)
Eos: 1 %
Hematocrit: 46.1 % (ref 34.0–46.6)
Hemoglobin: 15.2 g/dL (ref 11.1–15.9)
Immature Grans (Abs): 0 10*3/uL (ref 0.0–0.1)
Immature Granulocytes: 0 %
Lymphocytes Absolute: 4.5 10*3/uL — ABNORMAL HIGH (ref 0.7–3.1)
Lymphs: 36 %
MCH: 25.1 pg — ABNORMAL LOW (ref 26.6–33.0)
MCHC: 33 g/dL (ref 31.5–35.7)
MCV: 76 fL — ABNORMAL LOW (ref 79–97)
Monocytes Absolute: 0.7 10*3/uL (ref 0.1–0.9)
Monocytes: 6 %
Neutrophils Absolute: 7 10*3/uL (ref 1.4–7.0)
Neutrophils: 57 %
Platelets: 299 10*3/uL (ref 150–450)
RBC: 6.06 x10E6/uL — ABNORMAL HIGH (ref 3.77–5.28)
RDW: 14.2 % (ref 11.7–15.4)
WBC: 12.4 10*3/uL — ABNORMAL HIGH (ref 3.4–10.8)

## 2021-01-10 ENCOUNTER — Ambulatory Visit (INDEPENDENT_AMBULATORY_CARE_PROVIDER_SITE_OTHER): Payer: Self-pay | Admitting: *Deleted

## 2021-01-10 NOTE — Telephone Encounter (Signed)
Summary: CPAP machine   Pt stated her CPAP machine is leaking, pt stated she has not been able to sleep since her machine is not working correctly, pt requested a call back from a nurse to discuss.     Pt reports CPAP leaking "Won't hold any water."  Reports went to facility where she got it, "They weren't there anymore, had closed." States there is no number on machine to call manufacture .States called [facility] and they have no record of pt having CPap.  Has had Cpap since 2017 or 2018. Assured pt NT would route to practice for PCPs review. Please advise: [phone] Reason for Disposition  [1] Caller requesting NON-URGENT health information AND [2] PCP's office is the best resource  Answer Assessment - Initial Assessment Questions 1. REASON FOR CALL or QUESTION: "What is your reason for calling today?" or "How can I best help you?" or "What question do you have that I can help answer?"     CPap leaking  Protocols used: Information Only Call - No Triage-[facility]

## 2021-01-11 ENCOUNTER — Other Ambulatory Visit (INDEPENDENT_AMBULATORY_CARE_PROVIDER_SITE_OTHER): Payer: Self-pay | Admitting: Primary Care

## 2021-01-11 DIAGNOSIS — G4733 Obstructive sleep apnea (adult) (pediatric): Secondary | ICD-10-CM

## 2021-01-11 NOTE — Telephone Encounter (Signed)
Contacted patient and advised need for appt to discuss OSA as it is not documented in her chart. Facility that she got CPAP from is closed. Will need new machine. FYI

## 2021-01-12 NOTE — Telephone Encounter (Signed)
I believe I had discussed this patient with Gwinda Passe, NP, her primary care provider.  She had a diagnostic sleep study in the past, but machine has broken so she needs new DME and replacement CPAP, probably auto 5-15. I, or one of our sleep docs can see her to establish here if that would help.

## 2021-01-16 NOTE — Progress Notes (Signed)
[facility]  Established Patient Office Visit  Subjective:  Patient ID: [name], female    DOB: Jan 11, 1971  Age: 50 y.o. MRN: [id number]  CC:  Chief Complaint  Patient presents with   Diabetes    HPI [name] is a 50 year old obese female who presents for management of chronic disease. Hypertension- denies shortness of breath, headaches, chest pain or lower extremity edema . Type 2 diabetes denies polyuria, polydipsia, polyphagia or vision changes. She is on statin for hyperlipidemia and ADA guidelines . She does have arthralgia and pain all over- previously followed by rheumatology.  Past Medical History:  Diagnosis Date   Anxiety    Common migraine with intractable migraine 10/02/2016   Depression    Diabetes mellitus    Hypertension     Past Surgical History:  Procedure Laterality Date   CHOLECYSTECTOMY     TUBAL LIGATION      Family History  Problem Relation Age of Onset   Diabetes Mother    Hypertension Mother    Renal Disease Mother    Cancer Father    Diabetes Sister    Diabetes Brother    Renal Disease Brother    Diabetes Brother    Diabetes Sister    Diabetes Brother    Diabetes Brother    Diabetes Brother    Healthy Daughter    Healthy Daughter     Social History   Socioeconomic History   Marital status: Single    Spouse name: Not on file   Number of children: Not on file   Years of education: Not on file   Highest education level: Not on file  Occupational History   Not on file  Tobacco Use   Smoking status: Every Day    Packs/day: 0.25    Types: Cigarettes   Smokeless tobacco: Never  Vaping Use   Vaping Use: Former  Substance and Sexual Activity   Alcohol use: No   Drug use: No   Sexual activity: Yes    Birth control/protection: None  Other Topics Concern   Not on file  Social History Narrative   Right handed    Lives with  daughter   Caffeine use: Drinks coffee/tea/soda sometimes   Social Determinants of Radio broadcast assistant Strain: Not on file  Food Insecurity: Not on file  Transportation Needs: Not on file  Physical Activity: Not on file  Stress: Not on file  Social Connections: Not on file  Intimate Partner Violence: Not on file    Outpatient Medications Prior to Visit  Medication Sig Dispense Refill   Accu-Chek Softclix Lancets lancets Use as instructed 100 each 12   albuterol (VENTOLIN HFA) 108 (90 Base) MCG/ACT inhaler Inhale 1-2 puffs into the lungs every 6 (six) hours as needed for wheezing or shortness of breath. 8 each 0   aspirin EC 81 MG tablet Take 81 mg by mouth every 6 (six) hours as needed for mild pain.      blood glucose meter kit and supplies Dispense based on patient and insurance preference. Use up to four times daily as directed. (FOR ICD-10 E10.9, E11.9). 1 each 0   DULoxetine (CYMBALTA) 60 MG capsule Take 1 capsule (60 mg total) by mouth 2 (two) times daily.  90 capsule 2   fluticasone (FLONASE) 50 MCG/ACT nasal spray Place 2 sprays into both nostrils daily. 16 mL 0   fluticasone (FLOVENT HFA) 44 MCG/ACT inhaler Inhale 2 puffs into the lungs 2 (two) times daily. 1 Inhaler 12   glucose blood (ACCU-CHEK GUIDE) test strip 1 each by Other route 3 (three) times daily after meals. Use as instructed 100 each 12   Insulin Pen Needle 31G X 8 MM MISC Use to inject Levemir BID. 100 each 2   Lancets (ACCU-CHEK SOFT TOUCH) lancets Use as instructed 100 each 12   pantoprazole (PROTONIX) 40 MG tablet TAKE 1 TABLET (40 MG TOTAL) BY MOUTH DAILY. 30 tablet 3   traZODone (DESYREL) 100 MG tablet Take 1 tablet (100 mg total) by mouth at bedtime. 30 tablet 2   TRULICITY 1.5 XT/0.5WP SOPN INJECT 1.5 MG INTO THE SKIN ONCE A WEEK. 2 mL 2   amLODipine (NORVASC) 10 MG tablet Take 1 tablet (10 mg total) by mouth daily. 90 tablet 3   gabapentin (NEURONTIN) 300 MG capsule TAKE 1 CAPSULE (300 MG TOTAL) BY  MOUTH 3 (THREE) TIMES DAILY. 90 capsule 0   ibuprofen (ADVIL) 800 MG tablet Take 1 tablet (800 mg total) by mouth every 8 (eight) hours as needed. 90 tablet 0   insulin detemir (LEVEMIR FLEXTOUCH) 100 UNIT/ML FlexPen INJECT 50 UNITS UNDER THE SKIN TWICE A DAY AFTER BREAKFAST AND DINNER 30 mL 0   losartan (COZAAR) 50 MG tablet Take 1 tablet (50 mg total) by mouth daily. 90 tablet 1   metFORMIN (GLUCOPHAGE) 1000 MG tablet TAKE ONE TABLET BY MOUTH TWICE DAILY (AM+BEDTIME) 180 tablet 1   pravastatin (PRAVACHOL) 10 MG tablet TAKE 1 TABLET (10 MG TOTAL) BY MOUTH DAILY. 90 tablet 1   benzonatate (TESSALON) 200 MG capsule Take 1 capsule (200 mg total) by mouth 3 (three) times daily as needed for cough. 20 capsule 0   methocarbamol (ROBAXIN) 500 MG tablet Take 1 tablet (500 mg total) by mouth every 8 (eight) hours as needed for muscle spasms. 90 tablet 1   promethazine-dextromethorphan (PROMETHAZINE-DM) 6.25-15 MG/5ML syrup Take 5 mLs by mouth 4 (four) times daily as needed for cough. 118 mL 0   No facility-administered medications prior to visit.    Allergies  Allergen Reactions   Lisinopril     angioedema    ROS Review of Systems  Musculoskeletal:  Positive for arthralgias.       Pain all over   All other systems reviewed and are negative.      Objective:   BP 126/84 (BP Location: Right Arm, Patient Position: Sitting, Cuff Size: Large)   Pulse 95   Temp (!) 97.5 F (36.4 C) (Temporal)   Ht '5\' 6"'  (1.676 m)   Wt 236 lb 6.4 oz (107.2 kg)   SpO2 100%   BMI 38.16 kg/m  Wt Readings from Last 3 Encounters:  01/07/21 236 lb 6.4 oz (107.2 kg)  08/13/20 241 lb (109.3 kg)  06/30/20 244 lb (110.7 kg)   Physical Exam Physical exam: General: Vital signs reviewed.  Patient is well-developed and well-nourished, obese female  in mild acute distress (unable to keep still or get comfortable for exam)and cooperative with exam. Head: Normocephalic and atraumatic. Eyes: EOMI, conjunctivae normal,  no scleral icterus. Neck: Supple, thick, trachea midline, normal ROM, no JVD, masses, thyromegaly, or carotid bruit present. Cardiovascular: RRR, S1 normal, S2 normal, no murmurs, gallops, or rubs. Pulmonary/Chest: Clear to auscultation bilaterally, no wheezes, rales, or rhonchi.  Abdominal: Soft, non-tender, non-distended, BS +, no masses, organomegaly, or guarding present. Musculoskeletal: No joint deformities, erythema, or stiffness, ROM full and nontender. Extremities: No lower extremity edema bilaterally,  pulses symmetric and intact bilaterally. No cyanosis or clubbing. Neurological: A&O x3, Strength is normal Skin: Warm, dry and intact. No rashes or erythema. Psychiatric: Normal mood and affect. speech and behavior is normal. Cognition and memory are normal.    Health Maintenance Due  Topic Date Due   COLONOSCOPY (Pts 45-34yr Insurance coverage will need to be confirmed)  Never done   Zoster Vaccines- Shingrix (1 of 2) Never done   OPHTHALMOLOGY EXAM  10/20/2018   COVID-19 Vaccine (2 - Moderna series) 12/23/2019    There are no preventive care reminders to display for this patient.  Lab Results  Component Value Date   TSH 2.870 10/02/2018   Lab Results  Component Value Date   WBC 12.4 (H) 01/07/2021   HGB 15.2 01/07/2021   HCT 46.1 01/07/2021   MCV 76 (L) 01/07/2021   PLT 299 01/07/2021   Lab Results  Component Value Date   NA 141 01/07/2021   K 4.1 01/07/2021   CO2 22 01/07/2021   GLUCOSE 175 (H) 01/07/2021   BUN 10 01/07/2021   CREATININE 0.83 01/07/2021   BILITOT 0.4 01/07/2021   ALKPHOS 165 (H) 01/07/2021   AST 74 (H) 01/07/2021   ALT 81 (H) 01/07/2021   PROT 7.6 01/07/2021   ALBUMIN 4.6 01/07/2021   CALCIUM 9.8 01/07/2021   ANIONGAP 10 12/07/2019   EGFR 85 01/07/2021   Lab Results  Component Value Date   CHOL 222 (H) 01/07/2021   Lab Results  Component Value Date   HDL 42 01/07/2021   Lab Results  Component Value Date   LDLCALC 158 (H)  01/07/2021   Lab Results  Component Value Date   TRIG 121 01/07/2021   Lab Results  Component Value Date   CHOLHDL 5.3 (H) 01/07/2021   Lab Results  Component Value Date   HGBA1C 9.4 (A) 01/07/2021      Assessment & Plan:  PHarmanwas seen today for diabetes.  Diagnoses and all orders for this visit:  Type 2 diabetes mellitus without complication, without long-term current use of insulin (HCC) -     HgB A1c 9.4 increase . Discussed  co- morbidities with uncontrol diabetes  Complications -diabetic retinopathy, (close your eyes ? What do you see nothing) nephropathy decrease in kidney function- can lead to dialysis-on a machine 3 days a week to filter your kidney, neuropathy- numbness and tinging in your hands and feet,  increase risk of heart attack and stroke, and amputation due to decrease wound healing and circulation. Decrease your risk by taking medication daily as prescribed, monitor carbohydrates- foods that are high in carbohydrates are the following rice, potatoes, breads, sugars, and pastas.  Reduction in the intake (eating) will assist in lowering your blood sugars. Exercise daily at least 30 minutes daily.  -     Glucose (CBG) -     insulin detemir (LEVEMIR FLEXTOUCH) 100 UNIT/ML FlexPen; INJECT 50 UNITS UNDER THE SKIN TWICE A DAY AFTER BREAKFAST AND DINNER -     metFORMIN (GLUCOPHAGE) 1000 MG tablet; TAKE ONE TABLET BY MOUTH TWICE DAILY (AM+BEDTIME) -     CBC with Differential -     Dulaglutide (TRULICITY) increased 390mweekly. F/u with Clinical pharmacist   Essential hypertension Blood pressure is at goal of less than 130/80, low-sodium, DASH diet, medication compliance, 150  minutes of moderate intensity exercise per week. Discussed medication compliance, adverse effects.  -     amLODipine (NORVASC) 10 MG tablet; Take 1 tablet (10 mg total) by mouth daily. -     losartan (COZAAR) 50 MG tablet; Take 1 tablet (50 mg total) by mouth daily. -     CMP14+EGFR  Chronic pain  syndrome -     ibuprofen (ADVIL) 800 MG tablet; Take 1 tablet (800 mg total) by mouth every 8 (eight) hours as needed.  Hyperlipidemia, unspecified hyperlipidemia type  Healthy lifestyle diet of fruits vegetables fish nuts whole grains and low saturated fat . Foods high in cholesterol or liver, fatty meats,cheese, butter avocados, nuts and seeds, chocolate and fried foods. -     pravastatin (PRAVACHOL) 10 MG tablet; Take 1 tablet (10 mg total) by mouth daily. -     Lipid Panel  Meds ordered this encounter  Medications   amLODipine (NORVASC) 10 MG tablet    Sig: Take 1 tablet (10 mg total) by mouth daily.    Dispense:  90 tablet    Refill:  3   ibuprofen (ADVIL) 800 MG tablet    Sig: Take 1 tablet (800 mg total) by mouth every 8 (eight) hours as needed.    Dispense:  90 tablet    Refill:  1   insulin detemir (LEVEMIR FLEXTOUCH) 100 UNIT/ML FlexPen    Sig: INJECT 50 UNITS UNDER THE SKIN TWICE A DAY AFTER BREAKFAST AND DINNER    Dispense:  30 mL    Refill:  0    Scheduled follow up appointment   losartan (COZAAR) 50 MG tablet    Sig: Take 1 tablet (50 mg total) by mouth daily.    Dispense:  90 tablet    Refill:  1   metFORMIN (GLUCOPHAGE) 1000 MG tablet    Sig: TAKE ONE TABLET BY MOUTH TWICE DAILY (AM+BEDTIME)    Dispense:  180 tablet    Refill:  1   pravastatin (PRAVACHOL) 10 MG tablet    Sig: Take 1 tablet (10 mg total) by mouth daily.    Dispense:  90 tablet    Refill:  1    Follow-up: Return in about 3 months (around 04/08/2021) for DM.    Kerin Perna, NP

## 2021-01-18 MED ORDER — TRULICITY 1.5 MG/0.5ML ~~LOC~~ SOAJ: 1.5000 mg | SUBCUTANEOUS | 2 refills | Status: DC

## 2021-01-18 MED ORDER — TRULICITY 3 MG/0.5ML ~~LOC~~ SOAJ: 3.0000 mg | SUBCUTANEOUS | 6 refills | Status: DC

## 2021-01-18 MED ORDER — PRAVASTATIN SODIUM 40 MG PO TABS: 40.0000 mg | ORAL_TABLET | Freq: Every day | ORAL | 1 refills | Status: DC

## 2021-01-27 NOTE — Telephone Encounter (Signed)
Please place order again so it can be faxed

## 2021-01-28 ENCOUNTER — Other Ambulatory Visit (INDEPENDENT_AMBULATORY_CARE_PROVIDER_SITE_OTHER): Payer: Self-pay | Admitting: Primary Care

## 2021-01-28 DIAGNOSIS — G4733 Obstructive sleep apnea (adult) (pediatric): Secondary | ICD-10-CM

## 2021-01-31 ENCOUNTER — Other Ambulatory Visit: Payer: Self-pay

## 2021-01-31 ENCOUNTER — Ambulatory Visit (INDEPENDENT_AMBULATORY_CARE_PROVIDER_SITE_OTHER): Payer: Medicaid Other | Admitting: Primary Care

## 2021-01-31 DIAGNOSIS — G4733 Obstructive sleep apnea (adult) (pediatric): Secondary | ICD-10-CM | POA: Diagnosis not present

## 2021-01-31 NOTE — Progress Notes (Signed)
[facility] Telephone Note  I connected with [name], on 01/31/2021 at 3:07 PM by telephone and verified that I am speaking with the correct person using two identifiers.   Consent: I discussed the limitations, risks, security and privacy concerns of performing an evaluation and management service by telephone and the availability of in person appointments. I also discussed with the patient that there may be a patient responsible charge related to this service. The patient expressed understanding and agreed to proceed.   Location of Patient: Home   Location of Provider: [facility] [place] participating in Telemedicine visit: Wilmarie Sparlin Juluis Mire,  NP Llana Aliment , CMA  History of Present Illness: [name]  Previously diagnosed and had a sleep study test done.  Discussed with pulmonologist due to patient having no difficulty and sleeping better until machine broke she will need new equipment with the same settings.   Past Medical History:  Diagnosis Date   Anxiety    Common migraine with intractable migraine 10/02/2016   Depression    Diabetes mellitus    Hypertension    Allergies  Allergen Reactions   Lisinopril     angioedema    Current Outpatient Medications on File Prior to Visit  Medication Sig Dispense Refill   Accu-Chek Softclix Lancets lancets Use as instructed 100 each 12   albuterol (VENTOLIN HFA) 108 (90 Base) MCG/ACT inhaler Inhale 1-2 puffs into the lungs every 6 (six) hours as needed for wheezing or shortness of breath. 8 each 0   amLODipine (NORVASC) 10 MG tablet Take 1 tablet (10 mg total) by mouth daily. 90 tablet 3   aspirin EC 81 MG tablet Take 81 mg by mouth every 6 (six) hours as needed for mild pain.      blood glucose meter kit and supplies Dispense based on patient and  insurance preference. Use up to four times daily as directed. (FOR ICD-10 E10.9, E11.9). 1 each 0   Dulaglutide (TRULICITY) 3 NO/6.7EH SOPN Inject 3 mg as directed once a week. 0.5 mL 6   DULoxetine (CYMBALTA) 60 MG capsule Take 1 capsule (60 mg total) by mouth 2 (two) times daily. 90 capsule 2   fluticasone (FLONASE) 50 MCG/ACT nasal spray Place 2 sprays into both nostrils daily. 16 mL 0   fluticasone (FLOVENT HFA) 44 MCG/ACT inhaler Inhale 2 puffs into the lungs 2 (two) times daily. 1 Inhaler 12   gabapentin (NEURONTIN) 300 MG capsule TAKE 1 CAPSULE (300 MG TOTAL) BY MOUTH 3 (THREE) TIMES DAILY. 90 capsule 0   glucose blood (ACCU-CHEK GUIDE) test strip 1 each by Other route 3 (three) times daily after meals. Use as instructed 100 each 12   ibuprofen (ADVIL) 800 MG tablet Take 1 tablet (800 mg total) by mouth every 8 (eight) hours as needed. 90 tablet 1   insulin detemir (LEVEMIR FLEXTOUCH) 100 UNIT/ML FlexPen INJECT 50 UNITS UNDER THE SKIN TWICE A DAY AFTER BREAKFAST AND DINNER 30 mL 0   Insulin Pen Needle 31G X 8 MM MISC Use to inject Levemir BID. 100 each 2   Lancets (ACCU-CHEK SOFT TOUCH) lancets Use as instructed 100 each 12   losartan (COZAAR) 50 MG tablet Take 1 tablet (50 mg total) by mouth daily. 90 tablet 1   metFORMIN (GLUCOPHAGE) 1000 MG tablet TAKE ONE TABLET BY MOUTH TWICE DAILY (AM+BEDTIME) 180 tablet  1   pantoprazole (PROTONIX) 40 MG tablet TAKE 1 TABLET (40 MG TOTAL) BY MOUTH DAILY. 30 tablet 3   pravastatin (PRAVACHOL) 40 MG tablet Take 1 tablet (40 mg total) by mouth daily. 90 tablet 1   traZODone (DESYREL) 100 MG tablet Take 1 tablet (100 mg total) by mouth at bedtime. 30 tablet 2   No current facility-administered medications on file prior to visit.    Observations/Objective: There were no vitals taken for this visit.   Assessment and Plan: [name] was seen today for sleep apnea.  Diagnoses and all orders for this visit:  OSA (obstructive sleep apnea) Patient  presents with possible obstructive sleep apnea. Patent has a  15  year history of symptoms of daytime fatigue, morning fatigue, nasal obstruction, morning headache, and hypertension. Patient generally gets 4 or 5 hours of sleep per night, and states they generally have difficulty falling back asleep if awakened. Snoring of severe severity is present. Apneic episodes is present. Nasal obstruction is present.  Patient has not had tonsillectomy.    Follow Up Instructions:    I discussed the assessment and treatment plan with the patient. The patient was provided an opportunity to ask questions and all were answered. The patient agreed with the plan and demonstrated an understanding of the instructions.   The patient was advised to call back or seek an in-person evaluation if the symptoms worsen or if the condition fails to improve as anticipated.     I provided 12 minutes total of non-face-to-face time during this encounter including median intraservice time, reviewing previous notes, investigations, ordering medications, medical decision making, coordinating care and patient verbalized understanding at the end of the visit.    This note has been created with Dragon speech recognition software and smart phrase technology. Any transcriptional errors are unintentional.   Michelle P Edwards, NP 01/31/2021, 3:07 PM  

## 2021-02-07 ENCOUNTER — Other Ambulatory Visit (INDEPENDENT_AMBULATORY_CARE_PROVIDER_SITE_OTHER): Payer: Self-pay | Admitting: Primary Care

## 2021-02-07 DIAGNOSIS — K219 Gastro-esophageal reflux disease without esophagitis: Secondary | ICD-10-CM

## 2021-02-07 NOTE — Telephone Encounter (Signed)
Requested Prescriptions  Pending Prescriptions Disp Refills  . pantoprazole (PROTONIX) 40 MG tablet [Pharmacy Med Name: PANTOPRAZOLE SODIUM 40 MG ORAL TABLET DELAYED RELEASE] 30 tablet 3    Sig: TAKE 1 TABLET (40 MG TOTAL) BY MOUTH DAILY (AM)     Gastroenterology: Proton Pump Inhibitors Passed - 02/07/2021  6:29 PM      Passed - Valid encounter within last 12 months    Recent Outpatient Visits          1 week ago OSA (obstructive sleep apnea)   [facility] Grayce Sessions, NP   1 month ago Type 2 diabetes mellitus without complication, without long-term current use of insulin (HCC)   [facility] Gwinda Passe P, NP   5 months ago Type 2 diabetes mellitus without complication, without long-term current use of insulin (HCC)   [facility] Gwinda Passe P, NP   7 months ago Arthralgia, unspecified joint   [facility] Gwinda Passe P, NP   8 months ago Type 2 diabetes mellitus without complication, with long-term current use of insulin [facility])   [facility] Lois Huxley, Cornelius Moras, RPH-CPP      Future Appointments            In 1 week Lois Huxley, Cornelius Moras, RPH-CPP [facility]           . celecoxib (CELEBREX) 200 MG capsule [Pharmacy Med Name: CELECOXIB 200 MG ORAL CAPSULE] 180 capsule 1    Sig: TAKE ONE CAPSULE BY MOUTH TWICE DAILY (AM+BEDTIME)     Analgesics:  COX2 Inhibitors Passed - 02/07/2021  6:29 PM      Passed - HGB in normal range and within 360 days    Hemoglobin  Date Value Ref Range Status  01/07/2021 15.2 11.1 - 15.9 g/dL Final         Passed - Cr in normal range and within 360 days    Creatinine, Ser  Date Value Ref Range Status  01/07/2021 0.83 0.57 - 1.00 mg/dL Final         Passed - Patient is not pregnant      Passed - Valid encounter within last 12 months    Recent Outpatient Visits           1 week ago OSA (obstructive sleep apnea)   [facility] Gwinda Passe P, NP   1 month ago Type 2 diabetes mellitus without complication, without long-term current use of insulin (HCC)   [facility] Gwinda Passe P, NP   5 months ago Type 2 diabetes mellitus without complication, without long-term current use of insulin (HCC)   [facility] Gwinda Passe P, NP   7 months ago Arthralgia, unspecified joint   [facility] Gwinda Passe P, NP   8 months ago Type 2 diabetes mellitus without complication, with long-term current use of insulin [facility])   [facility] Lois Huxley, Cornelius Moras, RPH-CPP      Future Appointments            In 1 week Lois Huxley, Cornelius Moras, RPH-CPP [facility]

## 2021-02-07 NOTE — Telephone Encounter (Signed)
Not on current med list.  Requested Prescriptions  Pending Prescriptions Disp Refills  . celecoxib (CELEBREX) 200 MG capsule [Pharmacy Med Name: CELECOXIB 200 MG ORAL CAPSULE] 180 capsule 1    Sig: TAKE ONE CAPSULE BY MOUTH TWICE DAILY (AM+BEDTIME)     Analgesics:  COX2 Inhibitors Passed - 02/07/2021  6:29 PM      Passed - HGB in normal range and within 360 days    Hemoglobin  Date Value Ref Range Status  01/07/2021 15.2 11.1 - 15.9 g/dL Final         Passed - Cr in normal range and within 360 days    Creatinine, Ser  Date Value Ref Range Status  01/07/2021 0.83 0.57 - 1.00 mg/dL Final         Passed - Patient is not pregnant      Passed - Valid encounter within last 12 months    Recent Outpatient Visits          1 week ago OSA (obstructive sleep apnea)   [facility] Gwinda Passe P, NP   1 month ago Type 2 diabetes mellitus without complication, without long-term current use of insulin (HCC)   [facility] Gwinda Passe P, NP   5 months ago Type 2 diabetes mellitus without complication, without long-term current use of insulin (HCC)   [facility] Gwinda Passe P, NP   7 months ago Arthralgia, unspecified joint   [facility] Gwinda Passe P, NP   8 months ago Type 2 diabetes mellitus without complication, with long-term current use of insulin (HCC)   [facility] Lois Huxley, Cornelius Moras, RPH-CPP      Future Appointments            In 1 week Lois Huxley, Cornelius Moras, RPH-CPP [facility]           Signed Prescriptions Disp Refills   pantoprazole (PROTONIX) 40 MG tablet 30 tablet 3    Sig: TAKE 1 TABLET (40 MG TOTAL) BY MOUTH DAILY (AM)     Gastroenterology: Proton Pump Inhibitors Passed - 02/07/2021  6:29 PM      Passed - Valid encounter within last 12 months    Recent Outpatient Visits          1 week  ago OSA (obstructive sleep apnea)   [facility] Grayce Sessions, NP   1 month ago Type 2 diabetes mellitus without complication, without long-term current use of insulin (HCC)   [facility] Gwinda Passe P, NP   5 months ago Type 2 diabetes mellitus without complication, without long-term current use of insulin (HCC)   [facility] Grayce Sessions, NP   7 months ago Arthralgia, unspecified joint   [facility] Grayce Sessions, NP   8 months ago Type 2 diabetes mellitus without complication, with long-term current use of insulin [facility])   [facility] Lois Huxley, Cornelius Moras, RPH-CPP      Future Appointments            In 1 week Lois Huxley, Cornelius Moras, RPH-CPP [facility]

## 2021-02-14 NOTE — Progress Notes (Signed)
PCP Gwinda Passe  S:     No chief complaint on file.  Patient arrives in good spirits. Presents for diabetes evaluation, education, and management. Patient was referred and last seen by Primary Care Provider on 01/07/2021 by Gwinda Passe. Last seen by pharmacy on 06/11/2020.  Patient reports Diabetes was diagnosed over 10 years ago.   Family/Social History:  Current smoker. Family history of DM, renal disease, and cancer  Insurance coverage/medication affordability: Medicaid  Medication adherence reported .   Current diabetes medications include:  -Trulicity 3 mg weekly -Metformin 1000 mg twice daily -Levemir 50 units twice daily  Medications for neuropathy: -Gabapentin 300 mg three times daily -Duloxetine 60 mg daily  Current hypertension medications include:  -Amlodipine 10 mg daily -Losartan 50 mg daily  Current hyperlipidemia medications include:  -Pravastatin 40 mg at bedtime  Patient denies hypoglycemic events.  Patient reported dietary habits: Eats 2-3 meals/day Patient reports "healthy diet" and low appetite. Reviewed what healthy diet consists of.  Patient-reported exercise habits: none. Counseled on 150 minutes of moderate intensity exercise per week.  Patient reports nocturia (nighttime urination) 1-3 times per night.  Patient denies neuropathy (nerve pain). Patient reports visual changes predominantly blurred vision Patient reports self foot exams.    Patient did not start the 3 mg dose of Trulicity until 02/13/21. Patient reports nausea for 2 days after increasing the dose of Trulicity which has significantly improved this morning.  O:    Lab Results  Component Value Date   HGBA1C 9.4 (A) 01/07/2021   There were no vitals filed for this visit.  Lipid Panel     Component Value Date/Time   CHOL 222 (H) 01/07/2021 1128   TRIG 121 01/07/2021 1128   HDL 42 01/07/2021 1128   CHOLHDL 5.3 (H) 01/07/2021 1128   LDLCALC 158 (H) 01/07/2021  1128    Home fasting blood sugars: Reports one measurement of home BG to be 121, however checks infrequently. Patient was encourage to check blood glucose daily.    BG in clinic today is 178  Clinical Atherosclerotic Cardiovascular Disease (ASCVD): No  The 10-year ASCVD risk score (Arnett DK, et al., 2019) is: 24.6%   Values used to calculate the score:     Age: 50 years     Sex: Female     Is Non-Hispanic African American: Yes     Diabetic: Yes     Tobacco smoker: Yes     Systolic Blood Pressure: 126 mmHg     Is BP treated: Yes     HDL Cholesterol: 42 mg/dL     Total Cholesterol: 222 mg/dL   A/P: Diabetes longstanding. Patient is able to verbalize appropriate hypoglycemia management plan. Medication adherence appears adequate. Control is suboptimal, however first injection of Trulicity 3 mg was given 2 days prior. -Continue current basal insulin regimen. Informed patient that the next step if blood glucose remains high may be to add meal time insulin. -Continue metformin 1000 mg BID -Continue Trulicity 3 mg weekly. Short supply of Zofran given for nausea. Expect symptoms to improve after 2 weeks of therapy. -Extensively discussed pathophysiology of diabetes, recommended lifestyle interventions, dietary effects on blood sugar control -Counseled on s/sx of and management of hypoglycemia -Next A1C anticipated 04/08/21.   ASCVD risk - primary prevention in patient with diabetes. Last LDL is not controlled. ASCVD risk score is >20%  - high intensity statin indicated.  -Stop pravastatin and start atorvastatin 40 mg daily.   Written patient instructions provided.  Total time in face to face counseling 32 minutes.   Follow up Pharmacist Clinic Visit in three weeks.   Thank you for allowing pharmacy to participate in this patient's care.  Enos Fling, PharmD PGY1 Pharmacy Resident 02/15/2021 11:25 AM

## 2021-02-15 ENCOUNTER — Encounter: Payer: Self-pay | Admitting: Pharmacist

## 2021-02-15 ENCOUNTER — Other Ambulatory Visit: Payer: Self-pay

## 2021-02-15 ENCOUNTER — Ambulatory Visit: Payer: Medicaid Other | Attending: Primary Care | Admitting: Pharmacist

## 2021-02-15 DIAGNOSIS — Z794 Long term (current) use of insulin: Secondary | ICD-10-CM

## 2021-02-15 DIAGNOSIS — E119 Type 2 diabetes mellitus without complications: Secondary | ICD-10-CM | POA: Diagnosis not present

## 2021-02-15 LAB — GLUCOSE, POCT (MANUAL RESULT ENTRY): POC Glucose: 178 mg/dl — AB (ref 70–99)

## 2021-02-15 MED ORDER — ATORVASTATIN CALCIUM 40 MG PO TABS
40.0000 mg | ORAL_TABLET | Freq: Every day | ORAL | 2 refills | Status: DC
  Filled 2021-02-15 – 2021-03-24 (×2): qty 30, 30d supply, fill #0

## 2021-02-15 MED ORDER — ONDANSETRON HCL 4 MG PO TABS: 4.0000 mg | ORAL_TABLET | Freq: Three times a day (TID) | ORAL | 0 refills | Status: DC | PRN

## 2021-02-15 MED ORDER — ONDANSETRON HCL 4 MG PO TABS
4.0000 mg | ORAL_TABLET | Freq: Three times a day (TID) | ORAL | 0 refills | Status: AC | PRN
  Filled 2021-02-15 – 2021-03-24 (×2): qty 20, 7d supply, fill #0

## 2021-02-18 ENCOUNTER — Other Ambulatory Visit (INDEPENDENT_AMBULATORY_CARE_PROVIDER_SITE_OTHER): Payer: Self-pay | Admitting: Primary Care

## 2021-02-18 ENCOUNTER — Telehealth (INDEPENDENT_AMBULATORY_CARE_PROVIDER_SITE_OTHER): Payer: Self-pay | Admitting: Primary Care

## 2021-02-18 DIAGNOSIS — E119 Type 2 diabetes mellitus without complications: Secondary | ICD-10-CM

## 2021-02-18 NOTE — Telephone Encounter (Signed)
Medication Refill - Medication:  insulin detemir (LEVEMIR FLEXTOUCH) 100 UNIT/ML FlexPen  Has the patient contacted their pharmacy? Yes.   Contact PCP, pt stated she is going out of town this afternoon  Preferred Pharmacy (with phone number or street name):  [facility] - [place], [place] - [address]  [address] [place], [place] [address]  Phone:  [phone]  Fax:  [phone]  Has the patient been seen for an appointment in the last year OR does the patient have an upcoming appointment? Yes.    Agent: Please be advised that RX refills may take up to 3 business days. We ask that you follow-up with your pharmacy.

## 2021-02-18 NOTE — Telephone Encounter (Signed)
Sent to PCP ?

## 2021-02-18 NOTE — Telephone Encounter (Signed)
Ordered today by provider, receipt confirmed by pharmacy.

## 2021-02-22 ENCOUNTER — Other Ambulatory Visit: Payer: Self-pay

## 2021-02-23 ENCOUNTER — Encounter (HOSPITAL_COMMUNITY): Payer: Self-pay

## 2021-02-23 ENCOUNTER — Encounter (HOSPITAL_COMMUNITY): Payer: Self-pay | Admitting: Psychiatry

## 2021-02-23 ENCOUNTER — Telehealth (INDEPENDENT_AMBULATORY_CARE_PROVIDER_SITE_OTHER): Payer: Medicaid Other | Admitting: Psychiatry

## 2021-02-23 DIAGNOSIS — F4381 Prolonged grief disorder: Secondary | ICD-10-CM | POA: Diagnosis not present

## 2021-02-23 DIAGNOSIS — F3341 Major depressive disorder, recurrent, in partial remission: Secondary | ICD-10-CM

## 2021-02-23 MED ORDER — DULOXETINE HCL 60 MG PO CPEP: 60.0000 mg | ORAL_CAPSULE | Freq: Two times a day (BID) | ORAL | 3 refills | Status: DC

## 2021-02-23 MED ORDER — TRAZODONE HCL 100 MG PO TABS: 100.0000 mg | ORAL_TABLET | Freq: Every day | ORAL | 3 refills | Status: DC

## 2021-02-23 NOTE — Progress Notes (Signed)
BH MD/PA/NP OP Progress Note Virtual Visit via Telephone Note  I connected with [name] on 02/23/21 at  3:00 PM EST by telephone and verified that I am speaking with the correct person using two identifiers.  Location: Patient: home Provider: Clinic   I discussed the limitations, risks, security and privacy concerns of performing an evaluation and management service by telephone and the availability of in person appointments. I also discussed with the patient that there may be a patient responsible charge related to this service. The patient expressed understanding and agreed to proceed.   I provided 30 minutes of non-face-to-face time during this encounter.  02/23/2021 1:12 PM [name]  MRN:  [id number]  Chief Complaint: "I am doing pretty well"  HPI: 50 year old female seen today for follow-up psychiatric evaluation.   She has a psychiatric history of depression and prolonged grief.  Currently she is managed on trazodone 100 mg nightly and Cymbalta 60 mg twice daily.  She notes her medications are effective in managing her psychiatric conditions.  Today patient was unable to logon virtually so assessment was done over the phone.  During exam she was pleasant, cooperative, and engaged in conversation.  She informed Probation officer that that she has been doing well since her last visit.  She notes that not much of a change.  She informed Probation officer that his family continues to be supportive and her grief (related to son's death).  Patient notes that her anxiety and depression has also improved since her last visit.  Provider conducted a GAD-7 and patient scored an 11, at her last visit she scored an 64.  Provider also conducted a PHQ-9 and patient scored a 13, at her last visit she scored a 14.  She endorsed adequate sleep and appetite.  Today she denies SI/HI/VAH, mania, paranoia.   Patient informed Probation officer that her diabetes continues to be problematic.  She informed Probation officer that she is trying  to get her A1C under control.   No medication changes made today.  Patient agreeable to continue medication as prescribed.  No other concerns at this time.   Visit Diagnosis:    ICD-10-CM   1. MDD (major depressive disorder), recurrent, in partial remission (HCC)  F33.41 traZODone (DESYREL) 100 MG tablet    DULoxetine (CYMBALTA) 60 MG capsule    2. 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 DULoxetine (CYMBALTA) 60 MG capsule      Past Psychiatric History:  MDD, grief with prolonged bereavement reaction  Past Medical History:  Past Medical History:  Diagnosis Date   Anxiety    Common migraine with intractable migraine 10/02/2016   Depression    Diabetes mellitus    Hypertension     Past Surgical History:  Procedure Laterality Date   CHOLECYSTECTOMY     TUBAL LIGATION      Family Psychiatric History: Denied any family psychiatric history  Family History:  Family History  Problem Relation Age of Onset   Diabetes Mother    Hypertension Mother    Renal Disease Mother    Cancer Father    Diabetes Sister    Diabetes Brother    Renal Disease Brother    Diabetes Brother    Diabetes Sister    Diabetes Brother    Diabetes Brother    Diabetes Brother    Healthy Daughter    Healthy Daughter     Social History:  Social History   Socioeconomic History   Marital status: Single    Spouse name:  Not on file   Number of children: Not on file   Years of education: Not on file   Highest education level: Not on file  Occupational History   Not on file  Tobacco Use   Smoking status: Every Day    Packs/day: 0.25    Types: Cigarettes   Smokeless tobacco: Never  Vaping Use   Vaping Use: Former  Substance and Sexual Activity   Alcohol use: No   Drug use: No   Sexual activity: Yes    Birth control/protection: None  Other Topics Concern   Not on file  Social History Narrative   Right handed    Lives with daughter   Caffeine use: Drinks coffee/tea/soda sometimes    Social Determinants of Radio broadcast assistant Strain: Not on file  Food Insecurity: Not on file  Transportation Needs: Not on file  Physical Activity: Not on file  Stress: Not on file  Social Connections: Not on file    Allergies:  Allergies  Allergen Reactions   Lisinopril     angioedema    Metabolic Disorder Labs: Lab Results  Component Value Date   HGBA1C 9.4 (A) 01/07/2021   No results found for: PROLACTIN Lab Results  Component Value Date   CHOL 222 (H) 01/07/2021   TRIG 121 01/07/2021   HDL 42 01/07/2021   CHOLHDL 5.3 (H) 01/07/2021   LDLCALC 158 (H) 01/07/2021   LDLCALC 125 (H) 08/01/2018   Lab Results  Component Value Date   TSH 2.870 10/02/2018   TSH 4.840 (H) 01/09/2018    Therapeutic Level Labs: No results found for: LITHIUM No results found for: VALPROATE No components found for:  CBMZ  Current Medications: Current Outpatient Medications  Medication Sig Dispense Refill   Accu-Chek Softclix Lancets lancets Use as instructed 100 each 12   albuterol (VENTOLIN HFA) 108 (90 Base) MCG/ACT inhaler Inhale 1-2 puffs into the lungs every 6 (six) hours as needed for wheezing or shortness of breath. 8 each 0   amLODipine (NORVASC) 10 MG tablet Take 1 tablet (10 mg total) by mouth daily. 90 tablet 3   aspirin EC 81 MG tablet Take 81 mg by mouth every 6 (six) hours as needed for mild pain.      atorvastatin (LIPITOR) 40 MG tablet Take 1 tablet (40 mg total) by mouth daily. 30 tablet 2   blood glucose meter kit and supplies Dispense based on patient and insurance preference. Use up to four times daily as directed. (FOR ICD-10 E10.9, E11.9). 1 each 0   Dulaglutide (TRULICITY) 3 RA/0.7MA SOPN Inject 3 mg as directed once a week. 0.5 mL 6   DULoxetine (CYMBALTA) 60 MG capsule Take 1 capsule (60 mg total) by mouth 2 (two) times daily. 90 capsule 3   fluticasone (FLONASE) 50 MCG/ACT nasal spray Place 2 sprays into both nostrils daily. 16 mL 0   fluticasone  (FLOVENT HFA) 44 MCG/ACT inhaler Inhale 2 puffs into the lungs 2 (two) times daily. 1 Inhaler 12   gabapentin (NEURONTIN) 300 MG capsule TAKE 1 CAPSULE (300 MG TOTAL) BY MOUTH 3 (THREE) TIMES DAILY. 90 capsule 0   glucose blood (ACCU-CHEK GUIDE) test strip 1 each by Other route 3 (three) times daily after meals. Use as instructed 100 each 12   ibuprofen (ADVIL) 800 MG tablet Take 1 tablet (800 mg total) by mouth every 8 (eight) hours as needed. 90 tablet 1   Insulin Pen Needle 31G X 8 MM MISC Use to inject  Levemir BID. 100 each 2   Lancets (ACCU-CHEK SOFT TOUCH) lancets Use as instructed 100 each 12   LEVEMIR FLEXTOUCH 100 UNIT/ML FlexTouch Pen INJECT 50 UNITS UNDER THE SKIN TWICE A DAY AFTER BREAKFAST AND DINNER 30 mL 3   losartan (COZAAR) 50 MG tablet Take 1 tablet (50 mg total) by mouth daily. 90 tablet 1   metFORMIN (GLUCOPHAGE) 1000 MG tablet TAKE ONE TABLET BY MOUTH TWICE DAILY (AM+BEDTIME) 180 tablet 1   ondansetron (ZOFRAN) 4 MG tablet Take 1 tablet (4 mg total) by mouth every 8 (eight) hours as needed for nausea or vomiting. 20 tablet 0   pantoprazole (PROTONIX) 40 MG tablet TAKE 1 TABLET (40 MG TOTAL) BY MOUTH DAILY (AM) 30 tablet 3   traZODone (DESYREL) 100 MG tablet Take 1 tablet (100 mg total) by mouth at bedtime. 30 tablet 3   No current facility-administered medications for this visit.     Musculoskeletal: Strength & Muscle Tone:  Unable to assess due to telephone visit Gait & Station:  Unable to assess due to telephone visit Patient leans: N/A  Psychiatric Specialty Exam: Review of Systems  There were no vitals taken for this visit.There is no height or weight on file to calculate BMI.  General Appearance:  Unable to assess due to telephone visit  Eye Contact:   Unable to assess due to telephone visit  Speech:  Clear and Coherent and Normal Rate  Volume:  Normal  Mood:  Euthymic and notes that at times he becomes anxious and depressed but is able to cope with it.   Affect:   Unable to assess due to telephone visit  Thought Process:  Coherent, Goal Directed, and Linear  Orientation:  Full (Time, Place, and Person)  Thought Content: WDL and Logical   Suicidal Thoughts:  No  Homicidal Thoughts:  No  Memory:  Immediate;   Good Recent;   Good Remote;   Good  Judgement:  Good  Insight:  Good  Psychomotor Activity:   Unable to assess due to telephone visit  Concentration:  Concentration: Good and Attention Span: Good  Recall:  Good  Fund of Knowledge: Good  Language: Good  Akathisia:  No  Handed:  Right  AIMS (if indicated): not done  Assets:  Communication Skills Desire for Improvement Financial Resources/Insurance Housing Leisure Time Physical Health Social Support  ADL's:  Intact  Cognition: WNL  Sleep:  Good   Screenings: GAD-7    Flowsheet Row Video Visit from 02/23/2021 in [facility] Office Visit from 01/31/2021 in [place] Video Visit from 11/23/2020 in [facility] Office Visit from 06/17/2020 in [place] Office Visit from 05/06/2020 in [place]  Total GAD-7 Score 11 0 '18 19 7      ' PHQ2-9    Flowsheet Row Video Visit from 02/23/2021 in [facility] Office Visit from 01/31/2021 in [place] Video Visit from 11/23/2020 in [facility] Video Visit from 08/25/2020 in [facility] Video Visit from 07/09/2020 in [facility]  PHQ-2 Total Score 4 0 '4 2 4  ' PHQ-9 Total Score 13 -- '14 5 15      ' Flowsheet Row Video Visit from 11/23/2020 in [facility] ED from 11/05/2020 in [facility] Video Visit from 08/25/2020 in [place]  RISK CATEGORY No Risk No Risk Error: Q3, 4, or 5 should not be  populated when Q2 is No        Assessment and Plan: Patient informed writer that she is doing well on her current medication regimen.  At times she notes she is anxious and depressed however reports that she is able to cope with it.  No medication changes made today.  Patient agreeable to continue medication as prescribed.   1. MDD (major depressive disorder), recurrent, in partial remission (HCC)  Continue- DULoxetine (CYMBALTA) 60 MG capsule; Take 1 capsule (60 mg total) by mouth 2 (two) times daily.  Dispense: 90 capsule; Refill: 2 Continue- traZODone (DESYREL) 100 MG tablet; Take 1 tablet (100 mg total) by mouth at bedtime.  Dispense: 30 tablet; Refill: 2  2. Grief reaction with prolonged bereavement  Continue- DULoxetine (CYMBALTA) 60 MG capsule; Take 1 capsule (60 mg total) by mouth 2 (two) times daily.  Dispense: 90 capsule; Refill: 2   Follow-up in 3 months Salley Slaughter, NP 02/23/2021, 1:12 PM

## 2021-02-24 ENCOUNTER — Telehealth (HOSPITAL_COMMUNITY): Payer: Self-pay | Admitting: Psychiatry

## 2021-02-24 ENCOUNTER — Other Ambulatory Visit (HOSPITAL_COMMUNITY): Payer: Self-pay | Admitting: Psychiatry

## 2021-02-24 DIAGNOSIS — F3341 Major depressive disorder, recurrent, in partial remission: Secondary | ICD-10-CM

## 2021-02-24 DIAGNOSIS — F4381 Prolonged grief disorder: Secondary | ICD-10-CM

## 2021-02-24 NOTE — Telephone Encounter (Signed)
Patient called for REFILL:  TRAZODONE & CYMBALTA Pharmacy :  [facility] Patient phone number: [phone] Last seen:  11/23/20 COMMENTS:   NO SHOW 02/23/21.  New appt scheduled for 05/19/21.

## 2021-02-24 NOTE — Telephone Encounter (Signed)
Medications were refilled on 02/23/2021 and sent to preferred pharmacy.

## 2021-03-01 ENCOUNTER — Other Ambulatory Visit (INDEPENDENT_AMBULATORY_CARE_PROVIDER_SITE_OTHER): Payer: Self-pay | Admitting: Primary Care

## 2021-03-01 DIAGNOSIS — E119 Type 2 diabetes mellitus without complications: Secondary | ICD-10-CM

## 2021-03-01 MED ORDER — METFORMIN HCL 1000 MG PO TABS: ORAL_TABLET | ORAL | 0 refills | Status: DC

## 2021-03-01 NOTE — Telephone Encounter (Signed)
Medication Refill - Medication: metFORMIN (GLUCOPHAGE) 1000 MG tablet  Has the patient contacted their pharmacy? Yes.    (Agent: If yes, when and what did the pharmacy advise?) Patient contacted pharmacy and was advised by pharmacist to contact PCP office. Patient stats she has been out of medication for 3 days and would like request expedited  Preferred Pharmacy (with phone number or street name):  [facility] - [place], [place] - [address] Phone:  [phone]  Fax:  [phone]      Has the patient been seen for an appointment in the last year OR does the patient have an upcoming appointment? Yes.    Agent: Please be advised that RX refills may take up to 3 business days. We ask that you follow-up with your pharmacy.

## 2021-03-07 ENCOUNTER — Other Ambulatory Visit (INDEPENDENT_AMBULATORY_CARE_PROVIDER_SITE_OTHER): Payer: Self-pay | Admitting: Primary Care

## 2021-03-07 NOTE — Telephone Encounter (Signed)
Sent to PCP to refill  

## 2021-03-15 ENCOUNTER — Ambulatory Visit: Payer: Medicaid Other | Admitting: Pharmacist

## 2021-03-24 ENCOUNTER — Other Ambulatory Visit: Payer: Self-pay

## 2021-03-24 ENCOUNTER — Ambulatory Visit: Payer: Medicaid Other | Attending: Physician Assistant | Admitting: Pharmacist

## 2021-03-24 DIAGNOSIS — E119 Type 2 diabetes mellitus without complications: Secondary | ICD-10-CM | POA: Diagnosis not present

## 2021-03-24 DIAGNOSIS — Z794 Long term (current) use of insulin: Secondary | ICD-10-CM | POA: Diagnosis not present

## 2021-03-24 MED ORDER — LANTUS SOLOSTAR 100 UNIT/ML ~~LOC~~ SOPN: 80.0000 [IU] | PEN_INJECTOR | Freq: Every day | SUBCUTANEOUS | 2 refills | Status: DC

## 2021-03-24 NOTE — Progress Notes (Signed)
PCP Gwinda Passe  S:     No chief complaint on file.  Patient arrives in good spirits. Presents for diabetes evaluation, education, and management. Patient was referred and last seen by Primary Care Provider on 01/07/2021 by Gwinda Passe. Last seen by pharmacy on 02/15/2021.  Patient reports Diabetes was diagnosed over 10 years ago. She has been experiencing significant bloating with nausea and gas since starting Trulicity. Endorses epigastric pain that she describes as tender. This has progressed since taking the 3mg  weekly dose.   Family/Social History:  Current smoker. Family history of DM, renal disease, and cancer  Insurance coverage/medication affordability: Medicaid  Medication adherence reported, however, she admits to missing doses of Levemir occasionally. She is interested in starting an insulin she can take once daily.  Current diabetes medications include:  -Trulicity 3 mg weekly -Metformin 1000 mg twice daily -Levemir 50 units twice daily  Medications for neuropathy: -Gabapentin 300 mg three times daily -Duloxetine 60 mg daily  Current hypertension medications include:  -Amlodipine 10 mg daily -Losartan 50 mg daily  Current hyperlipidemia medications include:  -Pravastatin 40 mg at bedtime  Patient denies hypoglycemic events.  Patient reported dietary habits: Eats 2-3 meals/day Patient reports "healthy diet" and low appetite. Reviewed what healthy diet consists of.  Patient-reported exercise habits: none. Counseled on 150 minutes of moderate intensity exercise per week.  Patient reports nocturia (nighttime urination) 1-3 times per night.  Patient denies neuropathy (nerve pain). Patient reports visual changes predominantly blurred vision Patient reports self foot exams.    O:    Lab Results  Component Value Date   HGBA1C 9.4 (A) 01/07/2021   There were no vitals filed for this visit.  Lipid Panel     Component Value Date/Time   CHOL 222  (H) 01/07/2021 1128   TRIG 121 01/07/2021 1128   HDL 42 01/07/2021 1128   CHOLHDL 5.3 (H) 01/07/2021 1128   LDLCALC 158 (H) 01/07/2021 1128    Home fasting blood sugars: has not been checking   Clinical Atherosclerotic Cardiovascular Disease (ASCVD): No  The 10-year ASCVD risk score (Arnett DK, et al., 2019) is: 24.6%   Values used to calculate the score:     Age: 50 years     Sex: Female     Is Non-Hispanic African American: Yes     Diabetic: Yes     Tobacco smoker: Yes     Systolic Blood Pressure: 126 mmHg     Is BP treated: Yes     HDL Cholesterol: 42 mg/dL     Total Cholesterol: 222 mg/dL   A/P: Diabetes longstanding. Patient is able to verbalize appropriate hypoglycemia management plan. Medication adherence appears adequate. Will change to Lantus and stop Trulicity. Checking a lipase to rule out GLP-1 induced pancreatitis. We will have her follow-up in 1 month and will likely have to add meal time bolus insulin.  -Stop Levemir.  -Change to Lantus 80 units daily. -Continue metformin 1000 mg BID.  -Stop Trulicity. Check lipase.  -Extensively discussed pathophysiology of diabetes, recommended lifestyle interventions, dietary effects on blood sugar control -Counseled on s/sx of and management of hypoglycemia -Next A1C anticipated 04/08/21.   ASCVD risk - primary prevention in patient with diabetes. Last LDL is not controlled. ASCVD risk score is >20%  - high intensity statin indicated.  -Continue atorvastatin 40 mg daily.   Written patient instructions provided.  Total time in face to face counseling 32 minutes.   Follow up Pharmacist Clinic Visit in 3-4  weeks.   Thank you for allowing pharmacy to participate in this patient's care.  Butch Penny, PharmD, P[place]y [place], CPP Clinical Pharmacist [facility] [phone]

## 2021-03-25 ENCOUNTER — Telehealth: Payer: Self-pay | Admitting: Primary Care

## 2021-03-25 LAB — LIPASE: Lipase: 33 U/L (ref 14–72)

## 2021-03-25 NOTE — Telephone Encounter (Signed)
Copied from CRM [id number]. Topic: General - Call Back - No Documentation >> Mar 25, 2021  8:31 AM Crist Infante wrote: Reason for CRM: pt would like Franky Macho to call her and go over her lab results

## 2021-03-25 NOTE — Telephone Encounter (Signed)
Call placed to patient. Her lipase is normal. She was informed and has no further questions.

## 2021-04-17 ENCOUNTER — Encounter (HOSPITAL_COMMUNITY): Payer: Self-pay | Admitting: *Deleted

## 2021-04-17 ENCOUNTER — Ambulatory Visit (HOSPITAL_COMMUNITY)
Admission: EM | Admit: 2021-04-17 | Discharge: 2021-04-17 | Disposition: A | Discharge status: Home / Self Care | Payer: Medicaid Other | Attending: Physician Assistant | Admitting: Physician Assistant

## 2021-04-17 ENCOUNTER — Other Ambulatory Visit: Payer: Self-pay

## 2021-04-17 DIAGNOSIS — H6691 Otitis media, unspecified, right ear: Secondary | ICD-10-CM | POA: Diagnosis not present

## 2021-04-17 MED ORDER — AMOXICILLIN-POT CLAVULANATE 875-125 MG PO TABS: 1.0000 | ORAL_TABLET | Freq: Two times a day (BID) | ORAL | 0 refills | Status: DC

## 2021-04-17 MED ORDER — KETOROLAC TROMETHAMINE 30 MG/ML IJ SOLN
INTRAMUSCULAR | Status: AC
  Filled 2021-04-17: qty 1

## 2021-04-17 MED ORDER — KETOROLAC TROMETHAMINE 30 MG/ML IJ SOLN
30.0000 mg | Freq: Once | INTRAMUSCULAR | Status: AC
  Administered 2021-04-17: 30 mg via INTRAMUSCULAR

## 2021-04-17 NOTE — Discharge Instructions (Addendum)
Take antibiotic as prescribed Drink plenty of fluids, rest Recommend daily allergy medicine like Zyretec or Claritin, can use Flonase as well. Recommend Mucinex. Take tylenol as needed for pain.  Return if no improvement

## 2021-04-17 NOTE — ED Provider Notes (Signed)
[place]    CSN: [id number] Arrival date & time: 04/17/21  1747      History   Chief Complaint Chief Complaint  Patient presents with   Sore Throat   Ear Pain    HPI [name] is a 51 y.o. female.   Pt complains of bilateral ear pain, right worse than left that started last night.  She also complains of sore throat, and sinus pressure, congestion.  She has taken otc cold and cough medication with with no improvement. Denies fever, shortness of breath, body aches.     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   Hot flashes 10/02/2018   Vaginal dryness 10/02/2018   BIH (benign intracranial hypertension) 11/06/2017   Somnolence, daytime 07/30/2017   Snoring 07/30/2017   Morbid obesity ([place]) 07/30/2017   Migraine 10/02/2016   Common migraine with intractable migraine 10/02/2016   Type 2 diabetes mellitus without complication, without long-term current use of insulin ([place]) 08/17/2016    Past Surgical History:  Procedure Laterality Date   CHOLECYSTECTOMY     TUBAL LIGATION      OB History     Gravida  5   Para  3   Term  3   Preterm      AB  2   Living  3      SAB  1   IAB  1   Ectopic      Multiple      Live Births               Home Medications    Prior to Admission medications   Medication Sig Start Date End Date Taking? Authorizing Provider  albuterol (VENTOLIN HFA) 108 (90 Base) MCG/ACT inhaler Inhale 1-2 puffs into the lungs every 6 (six) hours as needed for wheezing or shortness of breath. 11/05/20  Yes Covington, Sarah M, PA-C  amLODipine (NORVASC) 10 MG tablet Take 1 tablet (10 mg total) by mouth daily. 01/07/21   Yes Kerin Perna, NP  amoxicillin-clavulanate (AUGMENTIN) 875-125 MG tablet Take 1 tablet by mouth every 12 (twelve) hours. 04/17/21  Yes Ward, Lenise Arena, PA-C  aspirin EC 81 MG tablet Take 81 mg by mouth every 6 (six) hours as needed for mild pain.    Yes [provider]  atorvastatin (LIPITOR) 40 MG tablet Take 1 tablet (40 mg total) by mouth daily. 02/15/21  Yes Charlott Rakes, MD  celecoxib (CELEBREX) 200 MG capsule TAKE ONE CAPSULE BY MOUTH TWICE DAILY (AM+BEDTIME) 03/08/21  Yes Kerin Perna, NP  DULoxetine (CYMBALTA) 60 MG capsule Take 1 capsule (60 mg total) by mouth 2 (two) times daily. 02/23/21  Yes Eulis Canner E, NP  fluticasone (FLOVENT HFA) 44 MCG/ACT inhaler Inhale 2 puffs into the lungs 2 (two) times daily. 01/19/19  Yes Kerin Perna, NP  gabapentin (NEURONTIN) 300 MG capsule TAKE 1 CAPSULE (300 MG TOTAL) BY MOUTH 3 (THREE) TIMES DAILY. 01/13/21  Yes Gildardo Pounds, NP  insulin glargine (LANTUS SOLOSTAR) 100 UNIT/ML Solostar Pen Inject 80 Units into the skin daily. 03/24/21  Yes Charlott Rakes, MD  losartan (COZAAR) 50 MG tablet Take 1 tablet (50 mg  total) by mouth daily. 01/07/21  Yes Kerin Perna, NP  metFORMIN (GLUCOPHAGE) 1000 MG tablet TAKE ONE TABLET BY MOUTH TWICE DAILY (AM+BEDTIME) 03/01/21  Yes Kerin Perna, NP  pantoprazole (PROTONIX) 40 MG tablet TAKE 1 TABLET (40 MG TOTAL) BY MOUTH DAILY (AM) 02/07/21  Yes Kerin Perna, NP  traZODone (DESYREL) 100 MG tablet Take 1 tablet (100 mg total) by mouth at bedtime. 02/23/21  Yes Eulis Canner E, NP  Accu-Chek Softclix Lancets lancets Use as instructed 12/29/19   Kerin Perna, NP  blood glucose meter kit and supplies Dispense based on patient and insurance preference. Use up to four times daily as directed. (FOR ICD-10 E10.9, E11.9). 01/05/20   Kerin Perna, NP  fluticasone (FLONASE) 50 MCG/ACT nasal spray Place 2 sprays into both nostrils daily. 11/05/20    Hughie Closs, PA-C  glucose blood (ACCU-CHEK GUIDE) test strip 1 each by Other route 3 (three) times daily after meals. Use as instructed 10/26/20   Kerin Perna, NP  Insulin Pen Needle 31G X 8 MM MISC Use to inject Levemir BID. 05/21/20   Charlott Rakes, MD  Lancets (ACCU-CHEK SOFT TOUCH) lancets Use as instructed 12/29/19   Kerin Perna, NP  ondansetron (ZOFRAN) 4 MG tablet Take 1 tablet (4 mg total) by mouth every 8 (eight) hours as needed for nausea or vomiting. 02/15/21   Charlott Rakes, MD    Family History Family History  Problem Relation Age of Onset   Diabetes Mother    Hypertension Mother    Renal Disease Mother    Cancer Father    Diabetes Sister    Diabetes Brother    Renal Disease Brother    Diabetes Brother    Diabetes Sister    Diabetes Brother    Diabetes Brother    Diabetes Brother    Healthy Daughter    Healthy Daughter     Social History Social History   Tobacco Use   Smoking status: Every Day    Packs/day: 0.25    Types: Cigarettes   Smokeless tobacco: Never  Vaping Use   Vaping Use: Former  Substance Use Topics   Alcohol use: No   Drug use: No     Allergies   Lisinopril and Trulicity [dulaglutide]   Review of Systems Review of Systems  Constitutional:  Negative for chills and fever.  HENT:  Positive for congestion, ear pain and sinus pressure. Negative for sore throat.   Eyes:  Negative for pain and visual disturbance.  Respiratory:  Negative for cough and shortness of breath.   Cardiovascular:  Negative for chest pain and palpitations.  Gastrointestinal:  Negative for abdominal pain and vomiting.  Genitourinary:  Negative for dysuria and hematuria.  Musculoskeletal:  Negative for arthralgias and back pain.  Skin:  Negative for color change and rash.  Neurological:  Negative for seizures and syncope.  All other systems reviewed and are negative.   Physical Exam Triage Vital Signs ED Triage Vitals  Enc Vitals Group      BP 04/17/21 1755 (!) 153/85     Pulse Rate 04/17/21 1755 92     Resp 04/17/21 1755 18     Temp 04/17/21 1755 98.5 F (36.9 C)     Temp Source 04/17/21 1755 Oral     SpO2 04/17/21 1755 97 %     Weight --      Height --      Head Circumference --  Peak Flow --      Pain Score 04/17/21 1756 10     Pain Loc --      Pain Edu? --      Excl. in [place]? --    No data found.  Updated Vital Signs BP (!) 153/85    Pulse 92    Temp 98.5 F (36.9 C) (Oral)    Resp 18    SpO2 97%   Visual Acuity Right Eye Distance:   Left Eye Distance:   Bilateral Distance:    Right Eye Near:   Left Eye Near:    Bilateral Near:     Physical Exam Vitals and nursing note reviewed.  Constitutional:      General: She is not in acute distress.    Appearance: She is well-developed.  HENT:     Head: Normocephalic and atraumatic.     Right Ear: Hearing and ear canal normal. Tympanic membrane is erythematous and bulging.     Left Ear: Hearing and ear canal normal. Tympanic membrane is erythematous.  Eyes:     Conjunctiva/sclera: Conjunctivae normal.  Cardiovascular:     Rate and Rhythm: Normal rate and regular rhythm.     Heart sounds: No murmur heard. Pulmonary:     Effort: Pulmonary effort is normal. No respiratory distress.     Breath sounds: Normal breath sounds.  Abdominal:     Palpations: Abdomen is soft.     Tenderness: There is no abdominal tenderness.  Musculoskeletal:        General: No swelling.     Cervical back: Neck supple.  Skin:    General: Skin is warm and dry.     Capillary Refill: Capillary refill takes less than 2 seconds.  Neurological:     Mental Status: She is alert.  Psychiatric:        Mood and Affect: Mood normal.     UC Treatments / Results  Labs (all labs ordered are listed, but only abnormal results are displayed) Labs Reviewed - No data to display  EKG   Radiology No results found.  Procedures Procedures (including critical care  time)  Medications Ordered in UC Medications  ketorolac (TORADOL) 30 MG/ML injection 30 mg (has no administration in time range)    Initial Impression / Assessment and Plan / UC Course  I have reviewed the triage vital signs and the nursing notes.  Pertinent labs & imaging results that were available during my care of the patient were reviewed by me and considered in my medical decision making (see chart for details).     Right otitis media, antibiotics prescribed.  Supportive care discussed. Return precautions discussed.  Final Clinical Impressions(s) / UC Diagnoses   Final diagnoses:  Right otitis media, unspecified otitis media type     Discharge Instructions      Take antibiotic as prescribed Drink plenty of fluids, rest Recommend daily allergy medicine like Zyretec or Claritin, can use Flonase as well. Recommend Mucinex. Take tylenol as needed for pain.  Return if no improvement     ED Prescriptions     Medication Sig Dispense Auth. Provider   amoxicillin-clavulanate (AUGMENTIN) 875-125 MG tablet Take 1 tablet by mouth every 12 (twelve) hours. 14 tablet Ward, Lenise Arena, PA-C      PDMP not reviewed this encounter.   Ward, Lenise Arena, PA-C 04/17/21 [phone]

## 2021-04-17 NOTE — ED Triage Notes (Signed)
C/O sore throat and bilat ear pain onset last night. Denies any other sxs.

## 2021-04-22 NOTE — Progress Notes (Unsigned)
° °  PCP Gwinda Passe  S:    Patient resents for diabetes evaluation, education, and management. Patient was referred and last seen by Primary Care Provider, Gwinda Passe, on 01/07/2021. Last seen by pharmacy on 03/24/21 at which time patient reported epigastric pain since increasing Trulicity to 3 mg. Lipase was normal. Trulicity was stopped and Levemir was switched to Lantus for once daily dosing.   *** -BG since stopping trulicity? May need to add bolus insulin today -due for updated A1c - send out? Updated lipid panel since switching prava to atorva?  -check BG if symptomatic   Patient reports Diabetes was diagnosed over 10 years ago.   Family/Social History:  Current smoker. Family history of DM, renal disease, and cancer  Insurance coverage/medication affordability: Medicaid  Medication adherence ***. Current diabetes medications include: metformin 1000 mg twice daily, Lantus 80 units daily -Of note, she is unable to tolerate Trulicity  Medications for neuropathy: gabapentin 300 mg three times daily, duloxetine 60 mg daily  Current hypertension medications include: amlodipine 10 mg daily, losartan 50 mg daily  Current hyperlipidemia medications include: atorvastatin 40 mg daily  Patient denies hypoglycemic events.  Patient reported dietary habits: Eats 2-3 meals/day Patient reports "healthy diet" and low appetite. Reviewed what healthy diet consists of.  Patient-reported exercise habits: none. Counseled on 150 minutes of moderate intensity exercise per week.  Patient reports nocturia (nighttime urination) 1-3 times per night.  Patient denies neuropathy (nerve pain). Patient reports visual changes predominantly blurred vision Patient reports self foot exams.    O:  POCT BG: ***  Lab Results  Component Value Date   HGBA1C 9.4 (A) 01/07/2021   There were no vitals filed for this visit.  Lipid Panel     Component Value Date/Time   CHOL 222 (H) 01/07/2021  1128   TRIG 121 01/07/2021 1128   HDL 42 01/07/2021 1128   CHOLHDL 5.3 (H) 01/07/2021 1128   LDLCALC 158 (H) 01/07/2021 1128    Home fasting blood sugars: has not been checking   Clinical Atherosclerotic Cardiovascular Disease (ASCVD): No  The 10-year ASCVD risk score (Arnett DK, et al., 2019) is: 45.1%   Values used to calculate the score:     Age: 51 years     Sex: Female     Is Non-Hispanic African American: Yes     Diabetic: Yes     Tobacco smoker: Yes     Systolic Blood Pressure: 153 mmHg     Is BP treated: Yes     HDL Cholesterol: 42 mg/dL     Total Cholesterol: 222 mg/dL   A/P: Diabetes longstanding currently uncontrolled with most recent A1c 9.4. Patient is able to verbalize appropriate hypoglycemia management plan. Medication adherence appears adequate. *** -*** -Extensively discussed pathophysiology of diabetes, recommended lifestyle interventions, dietary effects on blood sugar control -Counseled on s/sx of and management of hypoglycemia -Next A1C anticipated 04/08/21.   ASCVD risk - primary prevention in patient with diabetes. Last LDL is not controlled. ASCVD risk score is >20%  - high intensity statin indicated.  -Continue atorvastatin 40 mg daily.   Written patient instructions provided.  Total time in face to face counseling 32 minutes.   Follow up Pharmacist Clinic Visit in 3-4 weeks.

## 2021-04-25 ENCOUNTER — Ambulatory Visit: Payer: Medicaid Other | Admitting: Pharmacist

## 2021-04-27 DIAGNOSIS — G4733 Obstructive sleep apnea (adult) (pediatric): Secondary | ICD-10-CM | POA: Diagnosis not present

## 2021-05-03 ENCOUNTER — Other Ambulatory Visit (INDEPENDENT_AMBULATORY_CARE_PROVIDER_SITE_OTHER): Payer: Self-pay | Admitting: Primary Care

## 2021-05-03 DIAGNOSIS — G894 Chronic pain syndrome: Secondary | ICD-10-CM

## 2021-05-03 NOTE — Telephone Encounter (Signed)
Sates medication was d/c on 03/08/2021. Patient requesting refill. Sent to PCP to refill if appropriate.

## 2021-05-05 ENCOUNTER — Ambulatory Visit (INDEPENDENT_AMBULATORY_CARE_PROVIDER_SITE_OTHER): Payer: Medicaid Other | Admitting: Primary Care

## 2021-05-18 ENCOUNTER — Encounter: Payer: Self-pay | Admitting: Pharmacist

## 2021-05-18 ENCOUNTER — Ambulatory Visit: Payer: Medicaid Other | Attending: Primary Care | Admitting: Pharmacist

## 2021-05-18 ENCOUNTER — Ambulatory Visit: Payer: Medicaid Other | Admitting: Pharmacist

## 2021-05-18 DIAGNOSIS — Z794 Long term (current) use of insulin: Secondary | ICD-10-CM

## 2021-05-18 DIAGNOSIS — E119 Type 2 diabetes mellitus without complications: Secondary | ICD-10-CM | POA: Diagnosis not present

## 2021-05-18 LAB — POCT GLYCOSYLATED HEMOGLOBIN (HGB A1C): HbA1c, POC (controlled diabetic range): 10 % — AB (ref 0.0–7.0)

## 2021-05-18 NOTE — Progress Notes (Signed)
° °  PCP Gwinda Passe  S:     No chief complaint on file.  Patient arrives in good spirits. Presents for diabetes evaluation, education, and management. Patient was referred and last seen by Primary Care Provider on 01/07/2021 by Gwinda Passe. Last seen by pharmacy on 03/24/2021. We changed her to Lantus and stopped Trulicity at that visit.  Today, she reports that her abdominal pain has resolved. Endorses excessive gas but no constipation or diarrhea. No change in bowel habits. Denies nausea or vomiting. No fevers.   Family/Social History:  Tobacco: Current smoker Family history of DM, renal disease, and cancer  Insurance coverage/medication affordability: Medicaid  Medication adherence reported. Current diabetes medications include:  -Metformin 1000 mg twice daily -Lantus 80 units daily (morning)  Patient denies hypoglycemic events.  Patient reported dietary habits: Eats 2-3 meals/day Patient reports "healthy diet" and low appetite. Reviewed what healthy diet consists of.  Patient-reported exercise habits: none. Recently got a membership to [employer].   Patient denies nocturia (nighttime urination) 1-3 times per night. 0 Patient denies neuropathy (nerve pain). Patient denies  visual changes predominantly blurred vision Patient reports self foot exams.    O:  Lab Results  Component Value Date   HGBA1C 10.0 (A) 05/18/2021   There were no vitals filed for this visit.  Lipid Panel     Component Value Date/Time   CHOL 222 (H) 01/07/2021 1128   TRIG 121 01/07/2021 1128   HDL 42 01/07/2021 1128   CHOLHDL 5.3 (H) 01/07/2021 1128   LDLCALC 158 (H) 01/07/2021 1128   Home fasting blood sugars: Denies any readings >120. Lowest reading is 87.   Clinical Atherosclerotic Cardiovascular Disease (ASCVD): No  The 10-year ASCVD risk score (Arnett DK, et al., 2019) is: 46%   Values used to calculate the score:     Age: 51 years     Sex: Female     Is Non-Hispanic  African American: Yes     Diabetic: Yes     Tobacco smoker: Yes     Systolic Blood Pressure: 153 mmHg     Is BP treated: Yes     HDL Cholesterol: 42 mg/dL     Total Cholesterol: 222 mg/dL   A/P: Diabetes longstanding. Patient is able to verbalize appropriate hypoglycemia management plan. Medication adherence appears adequate. A1c today is slightly higher than in September of 2022. We changed her to Lantus ~1 month ago and she tells me her sugars have just recently been in the 80-120 range. Will hold off on changes and see her again in 1 month to reassess. -Continue Lantus 80 units daily. -Continue metformin 1000 mg BID.  -Extensively discussed pathophysiology of diabetes, recommended lifestyle interventions, dietary effects on blood sugar control -Counseled on s/sx of and management of hypoglycemia -Next A1C anticipated 08/2021.   Written patient instructions provided.  Total time in face to face counseling 30 minutes.   Follow up Pharmacist Clinic Visit in 3-4 weeks.   Thank you for allowing pharmacy to participate in this patient's care.  Butch Penny, PharmD, P[place]y [place], CPP Clinical Pharmacist [facility] [phone]

## 2021-05-19 ENCOUNTER — Telehealth (INDEPENDENT_AMBULATORY_CARE_PROVIDER_SITE_OTHER): Payer: Medicaid Other | Admitting: Psychiatry

## 2021-05-19 ENCOUNTER — Encounter (HOSPITAL_COMMUNITY): Payer: Self-pay | Admitting: Psychiatry

## 2021-05-19 ENCOUNTER — Ambulatory Visit (INDEPENDENT_AMBULATORY_CARE_PROVIDER_SITE_OTHER): Payer: Medicaid Other | Admitting: Primary Care

## 2021-05-19 DIAGNOSIS — F4381 Prolonged grief disorder: Secondary | ICD-10-CM

## 2021-05-19 DIAGNOSIS — Z7984 Long term (current) use of oral hypoglycemic drugs: Secondary | ICD-10-CM | POA: Diagnosis not present

## 2021-05-19 DIAGNOSIS — H52203 Unspecified astigmatism, bilateral: Secondary | ICD-10-CM | POA: Diagnosis not present

## 2021-05-19 DIAGNOSIS — F3341 Major depressive disorder, recurrent, in partial remission: Secondary | ICD-10-CM | POA: Diagnosis not present

## 2021-05-19 DIAGNOSIS — H2513 Age-related nuclear cataract, bilateral: Secondary | ICD-10-CM | POA: Diagnosis not present

## 2021-05-19 DIAGNOSIS — M2550 Pain in unspecified joint: Secondary | ICD-10-CM

## 2021-05-19 DIAGNOSIS — E1136 Type 2 diabetes mellitus with diabetic cataract: Secondary | ICD-10-CM | POA: Diagnosis not present

## 2021-05-19 DIAGNOSIS — H524 Presbyopia: Secondary | ICD-10-CM | POA: Diagnosis not present

## 2021-05-19 DIAGNOSIS — Z794 Long term (current) use of insulin: Secondary | ICD-10-CM | POA: Diagnosis not present

## 2021-05-19 DIAGNOSIS — H5213 Myopia, bilateral: Secondary | ICD-10-CM | POA: Diagnosis not present

## 2021-05-19 MED ORDER — TRAZODONE HCL 100 MG PO TABS: 100.0000 mg | ORAL_TABLET | Freq: Every day | ORAL | 3 refills | Status: DC

## 2021-05-19 MED ORDER — DULOXETINE HCL 60 MG PO CPEP: 60.0000 mg | ORAL_CAPSULE | Freq: Two times a day (BID) | ORAL | 3 refills | Status: DC

## 2021-05-19 NOTE — Progress Notes (Signed)
[facility] [place] MD/PA/NP OP Progress Note Virtual Visit via Telephone Note  I connected with [name] on 05/19/21 at  1:30 PM EST by telephone and verified that I am speaking with the correct person using two identifiers.  Location: Patient: home Provider: Clinic   I discussed the limitations, risks, security and privacy concerns of performing an evaluation and management service by telephone and the availability of in person appointments. I also discussed with the patient that there may be a patient responsible charge related to this service. The patient expressed understanding and agreed to proceed.   I provided 30 minutes of non-face-to-face time during this encounter.  05/19/2021 1:35 PM [name]  MRN:  [id number]  Chief Complaint: "I am doing well"  HPI: 51 year old female seen today for follow-up psychiatric evaluation.   She has a psychiatric history of depression and prolonged grief.  Currently she is managed on trazodone 100 mg nightly and Cymbalta 60 mg twice daily.  She notes her medications are effective in managing her psychiatric conditions.  Today patient was unable to logon virtually so assessment was done over the phone.  During exam she was pleasant, cooperative, and engaged in conversation.  She informed Probation officer that overall she is doing well.  She informed Probation officer that she has been having mild leg and knee pain.  She reports that on New Year's she was headed to church and fell down her steps.  To manage her pain she reports that she has been wearing a knee brace.  Provider encouraged patient to see an orthopedic specialist.  She endorsed understanding and notes that she would have the pain does not subside soon.  Since her last visit she notes that her anxiety and depression has somewhat increased however reports she is able to cope with that and request medications not be adjusted.  Provider conducted a GAD-7 and patient scored 18, at her last visit she scored an 11.  Provider  also conducted PHQ-9 patient scored a 17, at her last visit she scored a 13.  She endorses adequate appetite and notes that her sleep fluctuates noting that she sleeps 4 to 5 hours nightly.  At times patient notes that she gets anxious and paranoid in social settings.  She reports she believes that people are talking about her.  She denies SI/HI/VAH or mania.   At this time patient request medications not be adjusted.  She will continue medications as prescribed.  No other concerns at this time.   Visit Diagnosis:    ICD-10-CM   1. MDD (major depressive disorder), recurrent, in partial remission (HCC)  F33.41 traZODone (DESYREL) 100 MG tablet    DULoxetine (CYMBALTA) 60 MG capsule    2. Arthralgia, unspecified joint  M25.50     3. 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 DULoxetine (CYMBALTA) 60 MG capsule      Past Psychiatric History:  MDD, grief with prolonged bereavement reaction  Past Medical History:  Past Medical History:  Diagnosis Date   Anxiety    Common migraine with intractable migraine 10/02/2016   Depression    Diabetes mellitus    Hypertension     Past Surgical History:  Procedure Laterality Date   CHOLECYSTECTOMY     TUBAL LIGATION      Family Psychiatric History: Denied any family psychiatric history  Family History:  Family History  Problem Relation Age of Onset   Diabetes Mother    Hypertension Mother    Renal Disease Mother    Cancer Father  Diabetes Sister    Diabetes Brother    Renal Disease Brother    Diabetes Brother    Diabetes Sister    Diabetes Brother    Diabetes Brother    Diabetes Brother    Healthy Daughter    Healthy Daughter     Social History:  Social History   Socioeconomic History   Marital status: Single    Spouse name: Not on file   Number of children: Not on file   Years of education: Not on file   Highest education level: Not on file  Occupational History   Not on file  Tobacco Use   Smoking status:  Every Day    Packs/day: 0.25    Types: Cigarettes   Smokeless tobacco: Never  Vaping Use   Vaping Use: Former  Substance and Sexual Activity   Alcohol use: No   Drug use: No   Sexual activity: Not on file  Other Topics Concern   Not on file  Social History Narrative   Right handed    Lives with daughter   Caffeine use: Drinks coffee/tea/soda sometimes   Social Determinants of Radio broadcast assistant Strain: Not on file  Food Insecurity: Not on file  Transportation Needs: Not on file  Physical Activity: Not on file  Stress: Not on file  Social Connections: Not on file    Allergies:  Allergies  Allergen Reactions   Lisinopril     angioedema   Trulicity [Dulaglutide] Other (See Comments)    Gas, abdominal bloating    Metabolic Disorder Labs: Lab Results  Component Value Date   HGBA1C 10.0 (A) 05/18/2021   No results found for: PROLACTIN Lab Results  Component Value Date   CHOL 222 (H) 01/07/2021   TRIG 121 01/07/2021   HDL 42 01/07/2021   CHOLHDL 5.3 (H) 01/07/2021   LDLCALC 158 (H) 01/07/2021   LDLCALC 125 (H) 08/01/2018   Lab Results  Component Value Date   TSH 2.870 10/02/2018   TSH 4.840 (H) 01/09/2018    Therapeutic Level Labs: No results found for: LITHIUM No results found for: VALPROATE No components found for:  CBMZ  Current Medications: Current Outpatient Medications  Medication Sig Dispense Refill   Accu-Chek Softclix Lancets lancets Use as instructed 100 each 12   albuterol (VENTOLIN HFA) 108 (90 Base) MCG/ACT inhaler Inhale 1-2 puffs into the lungs every 6 (six) hours as needed for wheezing or shortness of breath. 8 each 0   amLODipine (NORVASC) 10 MG tablet Take 1 tablet (10 mg total) by mouth daily. 90 tablet 3   amoxicillin-clavulanate (AUGMENTIN) 875-125 MG tablet Take 1 tablet by mouth every 12 (twelve) hours. 14 tablet 0   aspirin EC 81 MG tablet Take 81 mg by mouth every 6 (six) hours as needed for mild pain.      atorvastatin  (LIPITOR) 40 MG tablet Take 1 tablet (40 mg total) by mouth daily. 30 tablet 2   blood glucose meter kit and supplies Dispense based on patient and insurance preference. Use up to four times daily as directed. (FOR ICD-10 E10.9, E11.9). 1 each 0   celecoxib (CELEBREX) 200 MG capsule TAKE ONE CAPSULE BY MOUTH TWICE DAILY (AM+BEDTIME) 180 capsule 1   DULoxetine (CYMBALTA) 60 MG capsule Take 1 capsule (60 mg total) by mouth 2 (two) times daily. 90 capsule 3   fluticasone (FLONASE) 50 MCG/ACT nasal spray Place 2 sprays into both nostrils daily. 16 mL 0   fluticasone (FLOVENT HFA) 44  MCG/ACT inhaler Inhale 2 puffs into the lungs 2 (two) times daily. 1 Inhaler 12   gabapentin (NEURONTIN) 300 MG capsule TAKE 1 CAPSULE (300 MG TOTAL) BY MOUTH 3 (THREE) TIMES DAILY. 90 capsule 0   glucose blood (ACCU-CHEK GUIDE) test strip 1 each by Other route 3 (three) times daily after meals. Use as instructed 100 each 12   insulin glargine (LANTUS SOLOSTAR) 100 UNIT/ML Solostar Pen Inject 80 Units into the skin daily. 30 mL 2   Insulin Pen Needle 31G X 8 MM MISC Use to inject Levemir BID. 100 each 2   Lancets (ACCU-CHEK SOFT TOUCH) lancets Use as instructed 100 each 12   losartan (COZAAR) 50 MG tablet Take 1 tablet (50 mg total) by mouth daily. 90 tablet 1   metFORMIN (GLUCOPHAGE) 1000 MG tablet TAKE ONE TABLET BY MOUTH TWICE DAILY (AM+BEDTIME) 180 tablet 0   ondansetron (ZOFRAN) 4 MG tablet Take 1 tablet (4 mg total) by mouth every 8 (eight) hours as needed for nausea or vomiting. 20 tablet 0   pantoprazole (PROTONIX) 40 MG tablet TAKE 1 TABLET (40 MG TOTAL) BY MOUTH DAILY (AM) 30 tablet 3   traZODone (DESYREL) 100 MG tablet Take 1 tablet (100 mg total) by mouth at bedtime. 30 tablet 3   No current facility-administered medications for this visit.     Musculoskeletal: Strength & Muscle Tone:  Unable to assess due to telephone visit Gait & Station:  Unable to assess due to telephone visit Patient leans:  N/A  Psychiatric Specialty Exam: Review of Systems  There were no vitals taken for this visit.There is no height or weight on file to calculate BMI.  General Appearance:  Unable to assess due to telephone visit  Eye Contact:   Unable to assess due to telephone visit  Speech:  Clear and Coherent and Normal Rate  Volume:  Normal  Mood:  Euthymic and notes that at times he becomes anxious and depressed but is able to cope with it.  Affect:   Unable to assess due to telephone visit  Thought Process:  Coherent, Goal Directed, and Linear  Orientation:  Full (Time, Place, and Person)  Thought Content: Logical and Paranoid Ideation   Suicidal Thoughts:  No  Homicidal Thoughts:  No  Memory:  Immediate;   Good Recent;   Good Remote;   Good  Judgement:  Good  Insight:  Good  Psychomotor Activity:   Unable to assess due to telephone visit  Concentration:  Concentration: Good and Attention Span: Good  Recall:  Good  Fund of Knowledge: Good  Language: Good  Akathisia:  No  Handed:  Right  AIMS (if indicated): not done  Assets:  Communication Skills Desire for Improvement Financial Resources/Insurance Housing Leisure Time Physical Health Social Support  ADL's:  Intact  Cognition: WNL  Sleep:  Fair   Screenings: GAD-7    Flowsheet Row Video Visit from 05/19/2021 in [facility] Video Visit from 02/23/2021 in [facility] Office Visit from 01/31/2021 in [place] Video Visit from 11/23/2020 in [facility] Office Visit from 06/17/2020 in [place]  Total GAD-7 Score 18 11 0 18 19      PHQ2-9    Flowsheet Row Video Visit from 05/19/2021 in [facility] Video Visit from 02/23/2021 in [facility] Office Visit from 01/31/2021 in [place] Video Visit from 11/23/2020 in  [facility] Video Visit from 08/25/2020 in [facility]  PHQ-2 Total Score 6 4 0 4 2  PHQ-9 Total Score 17 13 -- 14 5      Flowsheet Row ED from 04/17/2021 in [facility] Video Visit from 11/23/2020 in [facility] ED from 11/05/2020 in [facility] [place] No Risk No Risk No Risk        Assessment and Plan: Patient informed writer that she at times she feels paranoid, anxious, and depressed.  She however notes that she is able to cope with it and request that medications not be adjusted.  No medication changes made today.  Patient agreeable to continue medications as prescribed   1. MDD (major depressive disorder), recurrent, in partial remission (HCC)  Continue- DULoxetine (CYMBALTA) 60 MG capsule; Take 1 capsule (60 mg total) by mouth 2 (two) times daily.  Dispense: 90 capsule; Refill: 3 Continue- traZODone (DESYREL) 100 MG tablet; Take 1 tablet (100 mg total) by mouth at bedtime.  Dispense: 30 tablet; Refill: 3  2. Grief reaction with prolonged bereavement  Continue- DULoxetine (CYMBALTA) 60 MG capsule; Take 1 capsule (60 mg total) by mouth 2 (two) times daily.  Dispense: 90 capsule; Refill: 3   Follow-up in 3 months Salley Slaughter, NP 05/19/2021, 1:35 PM

## 2021-05-24 ENCOUNTER — Ambulatory Visit (INDEPENDENT_AMBULATORY_CARE_PROVIDER_SITE_OTHER): Payer: Medicaid Other | Admitting: Primary Care

## 2021-06-06 ENCOUNTER — Other Ambulatory Visit (INDEPENDENT_AMBULATORY_CARE_PROVIDER_SITE_OTHER): Payer: Self-pay | Admitting: Nurse Practitioner

## 2021-06-06 ENCOUNTER — Other Ambulatory Visit (INDEPENDENT_AMBULATORY_CARE_PROVIDER_SITE_OTHER): Payer: Self-pay | Admitting: Primary Care

## 2021-06-06 DIAGNOSIS — M2550 Pain in unspecified joint: Secondary | ICD-10-CM

## 2021-06-06 DIAGNOSIS — E119 Type 2 diabetes mellitus without complications: Secondary | ICD-10-CM

## 2021-06-06 DIAGNOSIS — G894 Chronic pain syndrome: Secondary | ICD-10-CM

## 2021-06-06 NOTE — Telephone Encounter (Signed)
Sent to PCP ?

## 2021-06-07 ENCOUNTER — Other Ambulatory Visit: Payer: Self-pay | Admitting: Family Medicine

## 2021-06-07 ENCOUNTER — Other Ambulatory Visit: Payer: Self-pay

## 2021-06-07 ENCOUNTER — Encounter (HOSPITAL_COMMUNITY): Payer: Self-pay | Admitting: Emergency Medicine

## 2021-06-07 ENCOUNTER — Ambulatory Visit (HOSPITAL_COMMUNITY)
Admission: EM | Admit: 2021-06-07 | Discharge: 2021-06-07 | Disposition: A | Discharge status: Home / Self Care | Payer: Medicaid Other | Attending: Nurse Practitioner | Admitting: Nurse Practitioner

## 2021-06-07 DIAGNOSIS — Z20822 Contact with and (suspected) exposure to covid-19: Secondary | ICD-10-CM | POA: Insufficient documentation

## 2021-06-07 DIAGNOSIS — I10 Essential (primary) hypertension: Secondary | ICD-10-CM | POA: Diagnosis not present

## 2021-06-07 DIAGNOSIS — J0140 Acute pansinusitis, unspecified: Secondary | ICD-10-CM

## 2021-06-07 DIAGNOSIS — E119 Type 2 diabetes mellitus without complications: Secondary | ICD-10-CM | POA: Insufficient documentation

## 2021-06-07 DIAGNOSIS — R051 Acute cough: Secondary | ICD-10-CM

## 2021-06-07 DIAGNOSIS — R0981 Nasal congestion: Secondary | ICD-10-CM | POA: Insufficient documentation

## 2021-06-07 DIAGNOSIS — J45909 Unspecified asthma, uncomplicated: Secondary | ICD-10-CM | POA: Insufficient documentation

## 2021-06-07 DIAGNOSIS — R0602 Shortness of breath: Secondary | ICD-10-CM | POA: Diagnosis not present

## 2021-06-07 LAB — POC INFLUENZA A AND B ANTIGEN (URGENT CARE ONLY)
INFLUENZA A ANTIGEN, POC: NEGATIVE
INFLUENZA B ANTIGEN, POC: NEGATIVE

## 2021-06-07 MED ORDER — PROMETHAZINE-DM 6.25-15 MG/5ML PO SYRP: 5.0000 mL | ORAL_SOLUTION | Freq: Four times a day (QID) | ORAL | 0 refills | Status: AC | PRN

## 2021-06-07 MED ORDER — FLUTICASONE PROPIONATE 50 MCG/ACT NA SUSP: 2.0000 | Freq: Every day | NASAL | 0 refills | Status: DC

## 2021-06-07 MED ORDER — DOXYCYCLINE HYCLATE 100 MG PO TABS: 100.0000 mg | ORAL_TABLET | Freq: Two times a day (BID) | ORAL | 0 refills | Status: AC

## 2021-06-07 MED ORDER — ALBUTEROL SULFATE HFA 108 (90 BASE) MCG/ACT IN AERS: 1.0000 | INHALATION_SPRAY | Freq: Four times a day (QID) | RESPIRATORY_TRACT | 0 refills | Status: AC | PRN

## 2021-06-07 NOTE — Discharge Instructions (Addendum)
Negative influenza test today.  Awaiting results of COVID test.  Check MyChart for COVID results within the next 24 to 48 hours. Take medications as prescribed. Increase fluids and get plenty of rest. Follow-up if symptoms worsen or do not improve.

## 2021-06-07 NOTE — ED Triage Notes (Signed)
Pt reports cough and nasal congestion x 1 week. Pt states the cough is getting progressively worse.

## 2021-06-07 NOTE — ED Provider Notes (Signed)
[place] [facility]    CSN: [id number] Arrival date & time: 06/07/21  1736      History   Chief Complaint Chief Complaint  Patient presents with   Cough   Nasal Congestion    HPI Shadia Larose is a 51 y.o. female.   The patient is a 51 year old female who presents for upper respiratory symptoms.  Symptoms have been present x1 week.  She states they are worsening over the past several days.  She complains most about her cough.  The cough is productive of thick, yellow sputum.  Cough is worse at night and keeps her from sleeping.  She states she is coughing so hard that she has episodes of incontinence.  She also complains of intermittent shortness of breath and wheezing.  She also has nasal congestion, runny nose, and fatigue.  She denies fever, chills, sore throat, ear pain, sinus pain, sinus pressure, or GI symptoms.  She reports she has been using an old prescription of Flonase but she ran out.  She has also been taking Tessalon Perles but feels they are not helping much at night.  She does have a history of asthma and bronchitis, hypertension and diabetes.  Her last A1c was around 10.   Cough Cough characteristics:  Productive Severity:  Moderate Progression:  Worsening Context: not sick contacts   Associated symptoms: rhinorrhea, shortness of breath and wheezing   Associated symptoms: no ear pain and no sore throat    Past Medical History:  Diagnosis Date   Anxiety    Common migraine with intractable migraine 10/02/2016   Depression    Diabetes mellitus    Hypertension     Patient Active Problem List   Diagnosis Date Noted   MDD (major depressive disorder), recurrent episode, moderate ([place]) 05/13/2020   Grief reaction with prolonged bereavement 05/13/2020   Hot flashes 10/02/2018   Vaginal dryness 10/02/2018   BIH (benign intracranial hypertension) 11/06/2017   Somnolence, daytime 07/30/2017   Snoring 07/30/2017   Morbid obesity ([place]) 07/30/2017   Migraine  10/02/2016   Common migraine with intractable migraine 10/02/2016   Type 2 diabetes mellitus without complication, without long-term current use of insulin ([place]) 08/17/2016    Past Surgical History:  Procedure Laterality Date   CHOLECYSTECTOMY     TUBAL LIGATION      OB History     Gravida  5   Para  3   Term  3   Preterm      AB  2   Living  3      SAB  1   IAB  1   Ectopic      Multiple      Live Births               Home Medications    Prior to Admission medications   Medication Sig Start Date End Date Taking? Authorizing Provider  Accu-Chek Softclix Lancets lancets Use as instructed 12/29/19   Kerin Perna, NP  albuterol (VENTOLIN HFA) 108 (90 Base) MCG/ACT inhaler Inhale 1-2 puffs into the lungs every 6 (six) hours as needed for wheezing or shortness of breath. 11/05/20   Hughie Closs, PA-C  amLODipine (NORVASC) 10 MG tablet Take 1 tablet (10 mg total) by mouth daily. 01/07/21   Kerin Perna, NP  amoxicillin-clavulanate (AUGMENTIN) 875-125 MG tablet Take 1 tablet by mouth every 12 (twelve) hours. 04/17/21   Ward, Lenise Arena, PA-C  aspirin EC 81 MG tablet Take 81 mg  by mouth every 6 (six) hours as needed for mild pain.     [provider]  atorvastatin (LIPITOR) 40 MG tablet Take 1 tablet (40 mg total) by mouth daily. 02/15/21   Charlott Rakes, MD  blood glucose meter kit and supplies Dispense based on patient and insurance preference. Use up to four times daily as directed. (FOR ICD-10 E10.9, E11.9). 01/05/20   Kerin Perna, NP  DULoxetine (CYMBALTA) 60 MG capsule Take 1 capsule (60 mg total) by mouth 2 (two) times daily. 05/19/21   Salley Slaughter, NP  fluticasone (FLONASE) 50 MCG/ACT nasal spray Place 2 sprays into both nostrils daily. 11/05/20   Hughie Closs, PA-C  fluticasone (FLOVENT HFA) 44 MCG/ACT inhaler Inhale 2 puffs into the lungs 2 (two) times daily. 01/19/19   Kerin Perna, NP  gabapentin (NEURONTIN)  300 MG capsule TAKE ONE CAPSULE BY MOUTH THREE TIMES DAILY ( AM+NOON+BEDTIME) 06/06/21   Kerin Perna, NP  glucose blood (ACCU-CHEK GUIDE) test strip 1 each by Other route 3 (three) times daily after meals. Use as instructed 10/26/20   Kerin Perna, NP  ibuprofen (ADVIL) 800 MG tablet TAKE 1 TABLET (800 MG TOTAL) BY MOUTH EVERY 8 (EIGHT) HOURS AS NEEDED. 06/06/21   Kerin Perna, NP  Insulin Pen Needle 31G X 8 MM MISC Use to inject Levemir BID. 05/21/20   Charlott Rakes, MD  Lancets (ACCU-CHEK SOFT TOUCH) lancets Use as instructed 12/29/19   Kerin Perna, NP  LANTUS SOLOSTAR 100 UNIT/ML Solostar Pen INJECT 80 UNITS INTO THE SKIN DAILY. 06/07/21   Charlott Rakes, MD  losartan (COZAAR) 50 MG tablet Take 1 tablet (50 mg total) by mouth daily. 01/07/21   Kerin Perna, NP  metFORMIN (GLUCOPHAGE) 1000 MG tablet TAKE ONE TABLET BY MOUTH TWICE DAILY (AM+BEDTIME) 03/01/21   Kerin Perna, NP  ondansetron (ZOFRAN) 4 MG tablet Take 1 tablet (4 mg total) by mouth every 8 (eight) hours as needed for nausea or vomiting. 02/15/21   Charlott Rakes, MD  pantoprazole (PROTONIX) 40 MG tablet TAKE 1 TABLET (40 MG TOTAL) BY MOUTH DAILY (AM) 02/07/21   Kerin Perna, NP  traZODone (DESYREL) 100 MG tablet Take 1 tablet (100 mg total) by mouth at bedtime. 05/19/21   Salley Slaughter, NP    Family History Family History  Problem Relation Age of Onset   Diabetes Mother    Hypertension Mother    Renal Disease Mother    Cancer Father    Diabetes Sister    Diabetes Brother    Renal Disease Brother    Diabetes Brother    Diabetes Sister    Diabetes Brother    Diabetes Brother    Diabetes Brother    Healthy Daughter    Healthy Daughter     Social History Social History   Tobacco Use   Smoking status: Every Day    Packs/day: 0.25    Types: Cigarettes   Smokeless tobacco: Never  Vaping Use   Vaping Use: Former  Substance Use Topics   Alcohol use: No   Drug use: No      Allergies   Lisinopril and Trulicity [dulaglutide]   Review of Systems Review of Systems  Constitutional:  Positive for activity change.  HENT:  Positive for congestion, postnasal drip and rhinorrhea. Negative for ear pain, sinus pressure, sinus pain and sore throat.   Eyes: Negative.   Respiratory:  Positive for cough, chest tightness, shortness of breath and  wheezing.   Gastrointestinal: Negative.   Skin: Negative.   Neurological: Negative.   Psychiatric/Behavioral: Negative.      Physical Exam Triage Vital Signs ED Triage Vitals  Enc Vitals Group     BP 06/07/21 1838 (!) 142/92     Pulse Rate 06/07/21 1838 85     Resp 06/07/21 1838 18     Temp 06/07/21 1838 99 F (37.2 C)     Temp Source 06/07/21 1838 Oral     SpO2 06/07/21 1837 100 %     Weight 06/07/21 1836 236 lb 5.3 oz (107.2 kg)     Height 06/07/21 1836 '5\' 6"'  (1.676 m)     Head Circumference --      Peak Flow --      Pain Score 06/07/21 1836 0     Pain Loc --      Pain Edu? --      Excl. in [place]? --    No data found.  Updated Vital Signs BP (!) 142/92 (BP Location: Right Arm)    Pulse 85    Temp 99 F (37.2 C) (Oral)    Resp 18    Ht '5\' 6"'  (1.676 m)    Wt 236 lb 5.3 oz (107.2 kg)    SpO2 100%    BMI 38.15 kg/m   Visual Acuity Right Eye Distance:   Left Eye Distance:   Bilateral Distance:    Right Eye Near:   Left Eye Near:    Bilateral Near:     Physical Exam Constitutional:      Appearance: Normal appearance.  HENT:     Head: Normocephalic and atraumatic.     Right Ear: Tympanic membrane normal.     Left Ear: Tympanic membrane normal.     Nose: Congestion and rhinorrhea present.     Right Turbinates: Enlarged and swollen.     Left Turbinates: Enlarged and swollen.     Right Sinus: Maxillary sinus tenderness and frontal sinus tenderness present.     Left Sinus: Maxillary sinus tenderness and frontal sinus tenderness present.     Mouth/Throat:     Mouth: Mucous membranes are moist.      Pharynx: Posterior oropharyngeal erythema present. No oropharyngeal exudate.  Eyes:     Extraocular Movements: Extraocular movements intact.     Pupils: Pupils are equal, round, and reactive to light.  Cardiovascular:     Rate and Rhythm: Normal rate and regular rhythm.     Pulses: Normal pulses.     Heart sounds: Normal heart sounds.  Pulmonary:     Effort: Pulmonary effort is normal.  Abdominal:     General: Bowel sounds are normal.     Palpations: Abdomen is soft.  Musculoskeletal:     Cervical back: Normal range of motion and neck supple.  Skin:    General: Skin is warm and dry.  Neurological:     Mental Status: She is alert.     UC Treatments / Results  Labs (all labs ordered are listed, but only abnormal results are displayed) Labs Reviewed  SARS CORONAVIRUS 2 (TAT 6-24 HRS)  POC INFLUENZA A AND B ANTIGEN (URGENT CARE ONLY)    EKG   Radiology No results found.  Procedures Procedures (including critical care time)  Medications Ordered in UC Medications - No data to display  Initial Impression / Assessment and Plan / UC Course  I have reviewed the triage vital signs and the nursing notes.  Pertinent labs & imaging  results that were available during my care of the patient were reviewed by me and considered in my medical decision making (see chart for details).  Negative influenza test today.  Awaiting results of her COVID test.  Symptoms are consistent with an acute pansinusitis based on her physical exam, worsening of symptoms, and physical exam.  Patient without fever, but has cough that is worsening causing shortness of breath and wheezing.  I am going to go ahead and treat her with doxycycline for 10 days given her history of uncontrolled diabetes, asthma and hypertension.  Patient will also be given symptomatic treatment with Promethazine DM for nighttime, she reports that she has some remaining Tessalon Perles left from her previous prescription that she will  take during the day.  We will also provide refill for albuterol inhaler in the event that she is running low at this time.  We will also prescribe Flonase nasal spray for her nasal congestion.  Other supportive treatment recommended to include getting plenty of rest and increasing fluids.  Patient instructed to follow-up if symptoms worsen or do not improve.   Final Clinical Impressions(s) / UC Diagnoses   Final diagnoses:  None   Discharge Instructions   None    ED Prescriptions   None    PDMP not reviewed this encounter.   Tish Men, NP 06/07/21 [phone]

## 2021-06-08 ENCOUNTER — Telehealth (INDEPENDENT_AMBULATORY_CARE_PROVIDER_SITE_OTHER): Payer: Self-pay

## 2021-06-08 LAB — SARS CORONAVIRUS 2 (TAT 6-24 HRS): SARS Coronavirus 2: NEGATIVE

## 2021-06-08 NOTE — Telephone Encounter (Signed)
Copied from [place] [id number]. Topic: General - Other ?>> Jun 08, 2021 12:29 PM Yvette Rack wrote: ?Reason for CRM: Tyson Alias with [facility] stated the Rx for LANTUS SOLOSTAR 100 UNIT/ML Solostar Pen needs prior authorization. ?

## 2021-06-09 ENCOUNTER — Other Ambulatory Visit: Payer: Self-pay

## 2021-06-21 ENCOUNTER — Ambulatory Visit: Payer: Medicaid Other | Admitting: Pharmacist

## 2021-07-04 ENCOUNTER — Other Ambulatory Visit (HOSPITAL_COMMUNITY): Payer: Self-pay | Admitting: Psychiatry

## 2021-07-04 ENCOUNTER — Other Ambulatory Visit (INDEPENDENT_AMBULATORY_CARE_PROVIDER_SITE_OTHER): Payer: Self-pay | Admitting: Nurse Practitioner

## 2021-07-04 ENCOUNTER — Other Ambulatory Visit (INDEPENDENT_AMBULATORY_CARE_PROVIDER_SITE_OTHER): Payer: Self-pay | Admitting: Primary Care

## 2021-07-04 DIAGNOSIS — F3341 Major depressive disorder, recurrent, in partial remission: Secondary | ICD-10-CM

## 2021-07-04 DIAGNOSIS — F4381 Prolonged grief disorder: Secondary | ICD-10-CM

## 2021-07-04 DIAGNOSIS — M2550 Pain in unspecified joint: Secondary | ICD-10-CM

## 2021-07-04 DIAGNOSIS — F4329 Adjustment disorder with other symptoms: Secondary | ICD-10-CM

## 2021-07-04 NOTE — Telephone Encounter (Signed)
Medication Refill - Medication:  ?gabapentin (NEURONTIN) 300 MG capsule  ? ?Has the patient contacted their pharmacy? Yes.   ?Pharmacy called on behalf ? ?Preferred Pharmacy (with phone number or street name):  ?[place], [place] - [address]  ?[place], [place] [address]  ?Phone:  [phone]  Fax:  [phone]  ? ?Has the patient been seen for an appointment in the last year OR does the patient have an upcoming appointment? Yes.   ? ?Agent: Please be advised that RX refills may take up to 3 business days. We ask that you follow-up with your pharmacy. ?

## 2021-07-05 ENCOUNTER — Other Ambulatory Visit: Payer: Self-pay

## 2021-07-05 ENCOUNTER — Ambulatory Visit (HOSPITAL_COMMUNITY)
Admission: EM | Admit: 2021-07-05 | Discharge: 2021-07-05 | Disposition: A | Discharge status: Home / Self Care | Payer: Medicaid Other

## 2021-07-05 ENCOUNTER — Emergency Department (HOSPITAL_BASED_OUTPATIENT_CLINIC_OR_DEPARTMENT_OTHER)
Admission: EM | Admit: 2021-07-05 | Discharge: 2021-07-06 | Disposition: A | Discharge status: Home / Self Care | Payer: Medicaid Other | Attending: Emergency Medicine | Admitting: Emergency Medicine

## 2021-07-05 ENCOUNTER — Emergency Department (HOSPITAL_BASED_OUTPATIENT_CLINIC_OR_DEPARTMENT_OTHER): Payer: Medicaid Other

## 2021-07-05 ENCOUNTER — Encounter (HOSPITAL_BASED_OUTPATIENT_CLINIC_OR_DEPARTMENT_OTHER): Payer: Self-pay

## 2021-07-05 ENCOUNTER — Encounter (HOSPITAL_COMMUNITY): Payer: Self-pay | Admitting: Emergency Medicine

## 2021-07-05 DIAGNOSIS — R1031 Right lower quadrant pain: Secondary | ICD-10-CM | POA: Diagnosis not present

## 2021-07-05 DIAGNOSIS — N83201 Unspecified ovarian cyst, right side: Secondary | ICD-10-CM | POA: Insufficient documentation

## 2021-07-05 DIAGNOSIS — Z794 Long term (current) use of insulin: Secondary | ICD-10-CM | POA: Insufficient documentation

## 2021-07-05 DIAGNOSIS — Z7951 Long term (current) use of inhaled steroids: Secondary | ICD-10-CM | POA: Diagnosis not present

## 2021-07-05 DIAGNOSIS — Z79899 Other long term (current) drug therapy: Secondary | ICD-10-CM | POA: Insufficient documentation

## 2021-07-05 DIAGNOSIS — N83291 Other ovarian cyst, right side: Secondary | ICD-10-CM | POA: Diagnosis not present

## 2021-07-05 DIAGNOSIS — Z7982 Long term (current) use of aspirin: Secondary | ICD-10-CM | POA: Diagnosis not present

## 2021-07-05 DIAGNOSIS — R102 Pelvic and perineal pain: Secondary | ICD-10-CM | POA: Diagnosis not present

## 2021-07-05 DIAGNOSIS — R748 Abnormal levels of other serum enzymes: Secondary | ICD-10-CM | POA: Diagnosis not present

## 2021-07-05 DIAGNOSIS — E119 Type 2 diabetes mellitus without complications: Secondary | ICD-10-CM | POA: Insufficient documentation

## 2021-07-05 DIAGNOSIS — I10 Essential (primary) hypertension: Secondary | ICD-10-CM | POA: Insufficient documentation

## 2021-07-05 DIAGNOSIS — K760 Fatty (change of) liver, not elsewhere classified: Secondary | ICD-10-CM | POA: Diagnosis not present

## 2021-07-05 LAB — COMPREHENSIVE METABOLIC PANEL
ALT: 39 U/L (ref 0–44)
AST: 29 U/L (ref 15–41)
Albumin: 4.2 g/dL (ref 3.5–5.0)
Alkaline Phosphatase: 156 U/L — ABNORMAL HIGH (ref 38–126)
Anion gap: 9 (ref 5–15)
BUN: 15 mg/dL (ref 6–20)
CO2: 25 mmol/L (ref 22–32)
Calcium: 9.9 mg/dL (ref 8.9–10.3)
Chloride: 106 mmol/L (ref 98–111)
Creatinine, Ser: 0.85 mg/dL (ref 0.44–1.00)
GFR, Estimated: >60 mL/min (ref >60)
Glucose, Bld: 243 mg/dL — ABNORMAL HIGH (ref 70–99)
Potassium: 4.2 mmol/L (ref 3.5–5.1)
Sodium: 140 mmol/L (ref 135–145)
Total Bilirubin: 0.4 mg/dL (ref 0.3–1.2)
Total Protein: 8.1 g/dL (ref 6.5–8.1)

## 2021-07-05 LAB — CBC
HCT: 43.9 % (ref 36.0–46.0)
Hemoglobin: 14.5 g/dL (ref 12.0–15.0)
MCH: 24.7 pg — ABNORMAL LOW (ref 26.0–34.0)
MCHC: 33 g/dL (ref 30.0–36.0)
MCV: 74.9 fL — ABNORMAL LOW (ref 80.0–100.0)
Platelets: 266 10*3/uL (ref 150–400)
RBC: 5.86 MIL/uL — ABNORMAL HIGH (ref 3.87–5.11)
RDW: 14.8 % (ref 11.5–15.5)
WBC: 15.6 10*3/uL — ABNORMAL HIGH (ref 4.0–10.5)
nRBC: 0 % (ref 0.0–0.2)

## 2021-07-05 LAB — URINALYSIS, ROUTINE W REFLEX MICROSCOPIC
Bilirubin Urine: NEGATIVE
Glucose, UA: NEGATIVE mg/dL
Nitrite: NEGATIVE
Protein, ur: 30 mg/dL — AB
Specific Gravity, Urine: 1.035 — ABNORMAL HIGH (ref 1.005–1.030)
pH: 6 (ref 5.0–8.0)

## 2021-07-05 LAB — LIPASE, BLOOD: Lipase: 16 U/L (ref 11–51)

## 2021-07-05 LAB — PREGNANCY, URINE: Preg Test, Ur: NEGATIVE

## 2021-07-05 MED ORDER — ONDANSETRON HCL 4 MG/2ML IJ SOLN
4.0000 mg | Freq: Once | INTRAMUSCULAR | Status: AC
  Administered 2021-07-05: 4 mg via INTRAVENOUS
  Filled 2021-07-05: qty 2

## 2021-07-05 MED ORDER — IOHEXOL 350 MG/ML SOLN
100.0000 mL | Freq: Once | INTRAVENOUS | Status: AC | PRN
  Administered 2021-07-05: 75 mL via INTRAVENOUS

## 2021-07-05 MED ORDER — MORPHINE SULFATE (PF) 4 MG/ML IV SOLN
4.0000 mg | Freq: Once | INTRAVENOUS | Status: AC
  Administered 2021-07-05: 4 mg via INTRAVENOUS
  Filled 2021-07-05: qty 1

## 2021-07-05 MED ORDER — LACTATED RINGERS IV BOLUS
1000.0000 mL | Freq: Once | INTRAVENOUS | Status: AC
Start: 2021-07-05 — End: 2021-07-06
  Administered 2021-07-05: 1000 mL via INTRAVENOUS

## 2021-07-05 NOTE — ED Provider Notes (Signed)
?[facility] ?Provider Note ? ? ?CSN: [id number] ?Arrival date & time: 07/05/21  2023 ? ?  ? ?History ? ?Chief Complaint  ?Patient presents with  ? Pelvic Pain  ? ? ?[name] is a 51 y.o. female. ? ?The history is provided by the patient.  ?Pelvic Pain ?She has history of hypertension, diabetes and comes in complaining of right lower quadrant pain for the last 2 days.  Pain has been constant.  Nothing seems to make it better or worse.  There is no radiation of pain.  There has been no nausea or vomiting.  She denies any urinary difficulty but denies any vaginal discharge.  She went to an urgent care center where she was referred here.  She has taken ibuprofen, naproxen, acetaminophen, BC powder without any benefit.  Of note, she is postmenopausal. ?  ?Home Medications ?Prior to Admission medications   ?Medication Sig Start Date End Date Taking? Authorizing Provider  ?Accu-Chek Softclix Lancets lancets Use as instructed 12/29/19   Kerin Perna, NP  ?albuterol (VENTOLIN HFA) 108 (90 Base) MCG/ACT inhaler Inhale 1-2 puffs into the lungs every 6 (six) hours as needed for wheezing or shortness of breath. 11/05/20   Hughie Closs, PA-C  ?albuterol (VENTOLIN HFA) 108 (90 Base) MCG/ACT inhaler Inhale 1-2 puffs into the lungs every 6 (six) hours as needed for up to 14 days for wheezing or shortness of breath. 06/07/21 06/21/21  Leath-Warren, Alda Lea, NP  ?amLODipine (NORVASC) 10 MG tablet Take 1 tablet (10 mg total) by mouth daily. 01/07/21   Kerin Perna, NP  ?amoxicillin-clavulanate (AUGMENTIN) 875-125 MG tablet Take 1 tablet by mouth every 12 (twelve) hours. 04/17/21   Ward, Lenise Arena, PA-C  ?aspirin EC 81 MG tablet Take 81 mg by mouth every 6 (six) hours as needed for mild pain.     [provider]  ?atorvastatin (LIPITOR) 40 MG tablet Take 1 tablet (40 mg total) by mouth daily. 02/15/21   Charlott Rakes, MD  ?blood glucose meter kit and supplies Dispense based on  patient and insurance preference. Use up to four times daily as directed. (FOR ICD-10 E10.9, E11.9). 01/05/20   Kerin Perna, NP  ?DULoxetine (CYMBALTA) 60 MG capsule Take 1 capsule (60 mg total) by mouth 2 (two) times daily. 05/19/21   Salley Slaughter, NP  ?fluticasone (FLONASE) 50 MCG/ACT nasal spray Place 2 sprays into both nostrils daily. 11/05/20   Hughie Closs, PA-C  ?fluticasone (FLONASE) 50 MCG/ACT nasal spray Place 2 sprays into both nostrils daily for 14 days. 06/07/21 06/21/21  Leath-Warren, Alda Lea, NP  ?fluticasone (FLOVENT HFA) 44 MCG/ACT inhaler Inhale 2 puffs into the lungs 2 (two) times daily. 01/19/19   Kerin Perna, NP  ?gabapentin (NEURONTIN) 300 MG capsule TAKE ONE CAPSULE BY MOUTH THREE TIMES DAILY ( AM+NOON+BEDTIME) 07/05/21   Kerin Perna, NP  ?glucose blood (ACCU-CHEK GUIDE) test strip 1 each by Other route 3 (three) times daily after meals. Use as instructed 10/26/20   Kerin Perna, NP  ?ibuprofen (ADVIL) 800 MG tablet TAKE 1 TABLET (800 MG TOTAL) BY MOUTH EVERY 8 (EIGHT) HOURS AS NEEDED. 06/06/21   Kerin Perna, NP  ?Insulin Pen Needle 31G X 8 MM MISC Use to inject Levemir BID. 05/21/20   Charlott Rakes, MD  ?Lancets (ACCU-CHEK SOFT TOUCH) lancets Use as instructed 12/29/19   Kerin Perna, NP  ?LANTUS SOLOSTAR 100 UNIT/ML Solostar Pen INJECT 80 UNITS INTO THE SKIN DAILY.  06/07/21   Charlott Rakes, MD  ?losartan (COZAAR) 50 MG tablet Take 1 tablet (50 mg total) by mouth daily. 01/07/21   Kerin Perna, NP  ?metFORMIN (GLUCOPHAGE) 1000 MG tablet TAKE ONE TABLET BY MOUTH TWICE DAILY (AM+BEDTIME) 03/01/21   Kerin Perna, NP  ?ondansetron (ZOFRAN) 4 MG tablet Take 1 tablet (4 mg total) by mouth every 8 (eight) hours as needed for nausea or vomiting. 02/15/21   Charlott Rakes, MD  ?pantoprazole (PROTONIX) 40 MG tablet TAKE 1 TABLET (40 MG TOTAL) BY MOUTH DAILY (AM) 02/07/21   Kerin Perna, NP  ?traZODone (DESYREL) 100 MG tablet Take  1 tablet (100 mg total) by mouth at bedtime. 05/19/21   Salley Slaughter, NP  ?   ? ?Allergies    ?Lisinopril and Trulicity [dulaglutide]   ? ?Review of Systems   ?Review of Systems  ?Genitourinary:  Positive for pelvic pain.  ?All other systems reviewed and are negative. ? ?Physical Exam ?Updated Vital Signs ?BP (!) 143/73   Pulse 63   Temp 98.1 ?F (36.7 ?C)   Resp 18   Ht 5' 6" (1.676 m)   Wt 107.2 kg   SpO2 98%   BMI 38.15 kg/m?  ?Physical Exam ?Vitals and nursing note reviewed.  ?51 year old female, resting comfortably and in no acute distress. Vital signs are significant for borderline elevated blood pressure. Oxygen saturation is 98%, which is normal. ?Head is normocephalic and atraumatic. PERRLA, EOMI. Oropharynx is clear. ?Neck is nontender and supple without adenopathy or JVD. ?Back is nontender and there is no CVA tenderness. ?Lungs are clear without rales, wheezes, or rhonchi. ?Chest is nontender. ?Heart has regular rate and rhythm without murmur. ?Abdomen is soft, flat, with marked tenderness throughout the right lower quadrant.  Tenderness is poorly localized.  There are no masses or hepatosplenomegaly and peristalsis is hypoactive. ?Extremities have no cyanosis or edema, full range of motion is present. ?Skin is warm and dry without rash. ?Neurologic: Mental status is normal, cranial nerves are intact, moves all extremities equally. ? ?ED Results / Procedures / Treatments   ?Labs ?(all labs ordered are listed, but only abnormal results are displayed) ?Labs Reviewed  ?COMPREHENSIVE METABOLIC PANEL - Abnormal; Notable for the following components:  ?    Result Value  ? Glucose, Bld 243 (*)   ? Alkaline Phosphatase 156 (*)   ? All other components within normal limits  ?CBC - Abnormal; Notable for the following components:  ? WBC 15.6 (*)   ? RBC 5.86 (*)   ? MCV 74.9 (*)   ? MCH 24.7 (*)   ? All other components within normal limits  ?URINALYSIS, ROUTINE W REFLEX MICROSCOPIC - Abnormal; Notable  for the following components:  ? Specific Gravity, Urine 1.035 (*)   ? Hgb urine dipstick TRACE (*)   ? Ketones, ur TRACE (*)   ? Protein, ur 30 (*)   ? Leukocytes,Ua LARGE (*)   ? All other components within normal limits  ?LIPASE, BLOOD  ?PREGNANCY, URINE  ? ?Radiology ?CT ABDOMEN PELVIS W CONTRAST ? ?Result Date: 07/05/2021 ?CLINICAL DATA:  Right lower quadrant abdominal pain. EXAM: CT ABDOMEN AND PELVIS WITH CONTRAST TECHNIQUE: Multidetector CT imaging of the abdomen and pelvis was performed using the standard protocol following bolus administration of intravenous contrast. RADIATION DOSE REDUCTION: This exam was performed according to the departmental dose-optimization program which includes automated exposure control, adjustment of the mA and/or kV according to patient size and/or use of  iterative reconstruction technique. CONTRAST:  22m OMNIPAQUE IOHEXOL 350 MG/ML SOLN COMPARISON:  None FINDINGS: Lower chest: The visualized lung bases are clear. No intra-abdominal free air or free fluid. Hepatobiliary: Fatty liver. No intrahepatic biliary dilatation. Cholecystectomy. Pancreas: Unremarkable. No pancreatic ductal dilatation or surrounding inflammatory changes. Spleen: Normal in size without focal abnormality. Adrenals/Urinary Tract: The adrenal glands unremarkable. The kidneys, visualized ureters, and urinary bladder appear unremarkable. Stomach/Bowel: There is no bowel obstruction or active inflammation. The appendix is normal. Vascular/Lymphatic: Mild aortoiliac atherosclerotic disease. The IVC is unremarkable. No portal venous gas. There is no adenopathy. Reproductive: The uterus is anteverted and grossly unremarkable. A 2.3 cm right ovarian complex appearing cyst. This can be better evaluated with ultrasound if clinically indicated. The left ovary is unremarkable. Other: None Musculoskeletal: No acute or significant osseous findings. IMPRESSION: 1. No bowel obstruction. Normal appendix. 2. Fatty liver. 3. A  2.3 cm right ovarian complex appearing cyst. This can be better evaluated with ultrasound if clinically indicated. 4. Aortic Atherosclerosis (ICD10-I70.0). Electronically Signed   By: ALaren EvertsD.

## 2021-07-05 NOTE — ED Triage Notes (Signed)
Pt is present today with left groin pain and lower abdominal pain. Pt states sx started x2 days ago ?

## 2021-07-05 NOTE — ED Triage Notes (Signed)
Patient here POV from Home with Pelvic Pain. ? ?Pain began approximately 2 weeks PTA and has worsened since. ? ?Pain is a "Hurting Pain" and is Constant. Pain is Right Lower ABD to Right Lower Pelvis.  ? ?No N/V/D. No Constipation. No Fevers. No Urinary Symptoms. ? ?NAD Noted during Triage. A&Ox4. GCS 15. Ambulatory. ?

## 2021-07-05 NOTE — ED Notes (Signed)
Patient transported to CT 

## 2021-07-05 NOTE — ED Provider Notes (Signed)
?[facility] ? ? ? ?CSN: [id number] ?Arrival date & time: 07/05/21  4098 ? ? ?  ? ?History   ?Chief Complaint ?Chief Complaint  ?Patient presents with  ? Abdominal Pain  ? ? ?HPI ?[name] is a 51 y.o. female.  ? ?Patient presents with right lower quadrant and right groin pain for 2 to 3 days.  Pain is constant described as a sharp pressure rating a 10 out of 10.  Symptoms are not worsened by movement.  Pain does not radiate.  Denies associated nausea, vomiting, diarrhea, bloating, indigestion, heartburn, increased gas production, fever, chills, URI symptoms.  Patient is perimenopausal.  Has attempted use of ibuprofen, Aleve and Tylenol which have not been effective.  History of a ovarian cyst.  Daily tobacco use. ? ?Past Medical History:  ?Diagnosis Date  ? Anxiety   ? Common migraine with intractable migraine 10/02/2016  ? Depression   ? Diabetes mellitus   ? Hypertension   ? ? ?Patient Active Problem List  ? Diagnosis Date Noted  ? MDD (major depressive disorder), recurrent episode, moderate ([place]) 05/13/2020  ? 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 Hot flashes 10/02/2018  ? Vaginal dryness 10/02/2018  ? BIH (benign intracranial hypertension) 11/06/2017  ? Somnolence, daytime 07/30/2017  ? Snoring 07/30/2017  ? Morbid obesity ([place]) 07/30/2017  ? Migraine 10/02/2016  ? Common migraine with intractable migraine 10/02/2016  ? Type 2 diabetes mellitus without complication, without long-term current use of insulin ([place]) 08/17/2016  ? ? ?Past Surgical History:  ?Procedure Laterality Date  ? CHOLECYSTECTOMY    ? TUBAL LIGATION    ? ? ?OB History   ? ? Gravida  ?5  ? Para  ?3  ? Term  ?3  ? Preterm  ?   ? AB  ?2  ? Living  ?3  ?  ? ? SAB  ?1  ? IAB  ?1  ? Ectopic  ?   ? Multiple  ?   ? Live Births  ?   ?   ?  ?  ? ? ? ?Home Medications   ? ?Prior to Admission medications   ?Medication Sig Start Date End Date Taking? Authorizing Provider  ?Accu-Chek Softclix Lancets lancets Use as instructed  12/29/19   Kerin Perna, NP  ?albuterol (VENTOLIN HFA) 108 (90 Base) MCG/ACT inhaler Inhale 1-2 puffs into the lungs every 6 (six) hours as needed for wheezing or shortness of breath. 11/05/20   Hughie Closs, PA-C  ?albuterol (VENTOLIN HFA) 108 (90 Base) MCG/ACT inhaler Inhale 1-2 puffs into the lungs every 6 (six) hours as needed for up to 14 days for wheezing or shortness of breath. 06/07/21 06/21/21  Leath-Warren, Alda Lea, NP  ?amLODipine (NORVASC) 10 MG tablet Take 1 tablet (10 mg total) by mouth daily. 01/07/21   Kerin Perna, NP  ?amoxicillin-clavulanate (AUGMENTIN) 875-125 MG tablet Take 1 tablet by mouth every 12 (twelve) hours. 04/17/21   Ward, Lenise Arena, PA-C  ?aspirin EC 81 MG tablet Take 81 mg by mouth every 6 (six) hours as needed for mild pain.     [provider]  ?atorvastatin (LIPITOR) 40 MG tablet Take 1 tablet (40 mg total) by mouth daily. 02/15/21   Charlott Rakes, MD  ?blood glucose meter kit and supplies Dispense based on patient and insurance preference. Use up to four times daily as directed. (FOR ICD-10 E10.9, E11.9). 01/05/20   Kerin Perna, NP  ?DULoxetine (CYMBALTA) 60 MG capsule Take  1 capsule (60 mg total) by mouth 2 (two) times daily. 05/19/21   Salley Slaughter, NP  ?fluticasone (FLONASE) 50 MCG/ACT nasal spray Place 2 sprays into both nostrils daily. 11/05/20   Hughie Closs, PA-C  ?fluticasone (FLONASE) 50 MCG/ACT nasal spray Place 2 sprays into both nostrils daily for 14 days. 06/07/21 06/21/21  Leath-Warren, Alda Lea, NP  ?fluticasone (FLOVENT HFA) 44 MCG/ACT inhaler Inhale 2 puffs into the lungs 2 (two) times daily. 01/19/19   Kerin Perna, NP  ?gabapentin (NEURONTIN) 300 MG capsule TAKE ONE CAPSULE BY MOUTH THREE TIMES DAILY ( AM+NOON+BEDTIME) 07/05/21   Kerin Perna, NP  ?glucose blood (ACCU-CHEK GUIDE) test strip 1 each by Other route 3 (three) times daily after meals. Use as instructed 10/26/20   Kerin Perna, NP   ?ibuprofen (ADVIL) 800 MG tablet TAKE 1 TABLET (800 MG TOTAL) BY MOUTH EVERY 8 (EIGHT) HOURS AS NEEDED. 06/06/21   Kerin Perna, NP  ?Insulin Pen Needle 31G X 8 MM MISC Use to inject Levemir BID. 05/21/20   Charlott Rakes, MD  ?Lancets (ACCU-CHEK SOFT TOUCH) lancets Use as instructed 12/29/19   Kerin Perna, NP  ?LANTUS SOLOSTAR 100 UNIT/ML Solostar Pen INJECT 80 UNITS INTO THE SKIN DAILY. 06/07/21   Charlott Rakes, MD  ?losartan (COZAAR) 50 MG tablet Take 1 tablet (50 mg total) by mouth daily. 01/07/21   Kerin Perna, NP  ?metFORMIN (GLUCOPHAGE) 1000 MG tablet TAKE ONE TABLET BY MOUTH TWICE DAILY (AM+BEDTIME) 03/01/21   Kerin Perna, NP  ?ondansetron (ZOFRAN) 4 MG tablet Take 1 tablet (4 mg total) by mouth every 8 (eight) hours as needed for nausea or vomiting. 02/15/21   Charlott Rakes, MD  ?pantoprazole (PROTONIX) 40 MG tablet TAKE 1 TABLET (40 MG TOTAL) BY MOUTH DAILY (AM) 02/07/21   Kerin Perna, NP  ?traZODone (DESYREL) 100 MG tablet Take 1 tablet (100 mg total) by mouth at bedtime. 05/19/21   Salley Slaughter, NP  ? ? ?Family History ?Family History  ?Problem Relation Age of Onset  ? Diabetes Mother   ? Hypertension Mother   ? Renal Disease Mother   ? Cancer Father   ? Diabetes Sister   ? Diabetes Brother   ? Renal Disease Brother   ? Diabetes Brother   ? Diabetes Sister   ? Diabetes Brother   ? Diabetes Brother   ? Diabetes Brother   ? Healthy Daughter   ? Healthy Daughter   ? ? ?Social History ?Social History  ? ?Tobacco Use  ? Smoking status: Every Day  ?  Packs/day: 0.25  ?  Types: Cigarettes  ? Smokeless tobacco: Never  ?Vaping Use  ? Vaping Use: Former  ?Substance Use Topics  ? Alcohol use: No  ? Drug use: No  ? ? ? ?Allergies   ?Lisinopril and Trulicity [dulaglutide] ? ? ?Review of Systems ?Review of Systems  ?Gastrointestinal:  Positive for abdominal pain.  ? ? ?Physical Exam ?Triage Vital Signs ?ED Triage Vitals  ?Enc Vitals Group  ?   BP 07/05/21 1921 (!) 152/85  ?    Pulse Rate 07/05/21 1921 79  ?   Resp 07/05/21 1921 18  ?   Temp 07/05/21 1921 98.6 ?F (37 ?C)  ?   Temp Source 07/05/21 1921 Oral  ?   SpO2 07/05/21 1921 95 %  ?   Weight --   ?   Height --   ?   Head Circumference --   ?  Peak Flow --   ?   Pain Score 07/05/21 1919 10  ?   Pain Loc --   ?   Pain Edu? --   ?   Excl. in [place]? --   ? ?No data found. ? ?Updated Vital Signs ?BP (!) 152/85   Pulse 79   Temp 98.6 ?F (37 ?C) (Oral)   Resp 18   SpO2 95%  ? ?Visual Acuity ?Right Eye Distance:   ?Left Eye Distance:   ?Bilateral Distance:   ? ?Right Eye Near:   ?Left Eye Near:    ?Bilateral Near:    ? ?Physical Exam ? ? ?UC Treatments / Results  ?Labs ?(all labs ordered are listed, but only abnormal results are displayed) ?Labs Reviewed - No data to display ? ?EKG ? ? ?Radiology ?No results found. ? ?Procedures ?Procedures (including critical care time) ? ?Medications Ordered in UC ?Medications - No data to display ? ?Initial Impression / Assessment and Plan / UC Course  ?I have reviewed the triage vital signs and the nursing notes. ? ?Pertinent labs & imaging results that were available during my care of the patient were reviewed by me and considered in my medical decision making (see chart for details). ? ?Pelvic pain in a female. ? ?Patient sent to the nearest emergency department to evaluate right lower quadrant and suprapubic tenderness, patient guarding and flinching on assessment with very minimal force given to palpation, most likely will need imaging, discussed with ?Final Clinical Impressions(s) / UC Diagnoses  ? ?Final diagnoses:  ?None  ? ?Discharge Instructions   ?None ?  ? ?ED Prescriptions   ?None ?  ? ?PDMP not reviewed this encounter. ?  ?Hans Eden, NP ?07/05/21 2014 ? ?

## 2021-07-06 MED ORDER — HYDROCODONE-ACETAMINOPHEN 5-325 MG PO TABS: 1.0000 | ORAL_TABLET | ORAL | 0 refills | Status: DC | PRN

## 2021-07-06 NOTE — Discharge Instructions (Addendum)
Your CT scan showed a small ovarian cyst, no other cause for your pain.  You may take ibuprofen and/or acetaminophen as needed for pain, reserve hydrocodone-acetaminophen for more severe pain.  Return to the emergency department if pain is getting worse or persisting.  Otherwise, [facility] of the [facility]. ?

## 2021-07-06 NOTE — Telephone Encounter (Signed)
Duplicate request. ?Requested Prescriptions  ?Pending Prescriptions Disp Refills  ?? gabapentin (NEURONTIN) 300 MG capsule 90 capsule 0  ?  Sig: TAKE ONE CAPSULE BY MOUTH THREE TIMES DAILY ( AM+NOON+BEDTIME)  ?  ? Neurology: Anticonvulsants - gabapentin Passed - 07/04/2021 10:29 PM  ?  ?  Passed - Cr in normal range and within 360 days  ?  Creatinine, Ser  ?Date Value Ref Range Status  ?07/05/2021 0.85 0.44 - 1.00 mg/dL Final  ?   ?  ?  Passed - Completed PHQ-2 or PHQ-9 in the last 360 days  ?  ?  Passed - Valid encounter within last 12 months  ?  Recent Outpatient Visits   ?      ? 1 month ago Type 2 diabetes mellitus without complication, with long-term current use of insulin (HCC)  ? [facility] Lois Huxley, Cornelius Moras, RPH-CPP  ? 3 months ago Type 2 diabetes mellitus without complication, with long-term current use of insulin (HCC)  ? [facility] Lois Huxley, Cornelius Moras, RPH-CPP  ? 4 months ago Type 2 diabetes mellitus without complication, with long-term current use of insulin (HCC)  ? [facility] Lois Huxley, Cornelius Moras, RPH-CPP  ? 5 months ago OSA (obstructive sleep apnea)  ? [facility] Grayce Sessions, NP  ? 6 months ago Type 2 diabetes mellitus without complication, without long-term current use of insulin (HCC)  ? [facility] Grayce Sessions, NP  ?  ?  ? ?  ?  ?  ? ? ?

## 2021-07-11 ENCOUNTER — Ambulatory Visit (INDEPENDENT_AMBULATORY_CARE_PROVIDER_SITE_OTHER): Payer: Medicaid Other | Admitting: Primary Care

## 2021-08-03 ENCOUNTER — Other Ambulatory Visit (INDEPENDENT_AMBULATORY_CARE_PROVIDER_SITE_OTHER): Payer: Self-pay | Admitting: Primary Care

## 2021-08-03 ENCOUNTER — Telehealth (INDEPENDENT_AMBULATORY_CARE_PROVIDER_SITE_OTHER): Payer: Medicaid Other | Admitting: Psychiatry

## 2021-08-03 DIAGNOSIS — K219 Gastro-esophageal reflux disease without esophagitis: Secondary | ICD-10-CM

## 2021-08-03 DIAGNOSIS — I10 Essential (primary) hypertension: Secondary | ICD-10-CM

## 2021-08-03 DIAGNOSIS — M2550 Pain in unspecified joint: Secondary | ICD-10-CM

## 2021-08-03 DIAGNOSIS — F3341 Major depressive disorder, recurrent, in partial remission: Secondary | ICD-10-CM | POA: Diagnosis not present

## 2021-08-03 MED ORDER — TRAZODONE HCL 100 MG PO TABS: 100.0000 mg | ORAL_TABLET | Freq: Every day | ORAL | 2 refills | Status: DC

## 2021-08-03 NOTE — Progress Notes (Signed)
BH MD/PA/NP OP Progress Note ? ?08/03/2021 4:55 PM ?[name]  ?MRN:  [id number] ? ?Virtual Visit via Telephone Note ? ?I connected with [name] on 08/03/21 at  1:30 PM EDT by telephone and verified that I am speaking with the correct person using two identifiers. ? ?Location: ?Patient: Home ?Provider: Offsite ?  ?I discussed the limitations, risks, security and privacy concerns of performing an evaluation and management service by telephone and the availability of in person appointments. I also discussed with the patient that there may be a patient responsible charge related to this service. The patient expressed understanding and agreed to proceed. ? ? ?  ?I discussed the assessment and treatment plan with the patient. The patient was provided an opportunity to ask questions and all were answered. The patient agreed with the plan and demonstrated an understanding of the instructions. ?  ?The patient was advised to call back or seek an in-person evaluation if the symptoms worsen or if the condition fails to improve as anticipated. ? ?I provided 10 minutes of non-face-to-face time during this encounter. ? ? ?Franne Grip, NP  ? ?Chief Complaint: Medication management ? ?HPI: [name] is a 51 year old female presenting to [facility] behavioral health outpatient for follow-up psychiatric evaluation.  She has a psychiatric history of major depressive disorder and her symptoms are managed with trazodone 100 mg at bedtime and Cymbalta 60 mg twice daily.  Patient reports that medications are effective with managing her symptoms and reports medication compliance.  Patient denies medication adverse effects or the need for dosage adjustment today.  No medication changes today. ? ?Patient is alert and oriented x4, calm, pleasant and willing to engage.  She reports good mood sleep and appetite.  Patient denies suicidal or homicidal ideations, delusional about, auditory or visual hallucinations. ? ? ?Visit  Diagnosis:  ?  ICD-10-CM   ?1. MDD (major depressive disorder), recurrent, in partial remission (HCC)  F33.41 traZODone (DESYREL) 100 MG tablet  ?  ? ? ?Past Psychiatric History: Major depressive disorder ? ?Past Medical History:  ?Past Medical History:  ?Diagnosis Date  ? Anxiety   ? Common migraine with intractable migraine 10/02/2016  ? Depression   ? Diabetes mellitus   ? Hypertension   ?  ?Past Surgical History:  ?Procedure Laterality Date  ? CHOLECYSTECTOMY    ? TUBAL LIGATION    ? ? ?Family Psychiatric History: None known ? ?Family History:  ?Family History  ?Problem Relation Age of Onset  ? Diabetes Mother   ? Hypertension Mother   ? Renal Disease Mother   ? Cancer Father   ? Diabetes Sister   ? Diabetes Brother   ? Renal Disease Brother   ? Diabetes Brother   ? Diabetes Sister   ? Diabetes Brother   ? Diabetes Brother   ? Diabetes Brother   ? Healthy Daughter   ? Healthy Daughter   ? ? ?Social History:  ?Social History  ? ?Socioeconomic History  ? Marital status: Single  ?  Spouse name: Not on file  ? Number of children: Not on file  ? Years of education: Not on file  ? Highest education level: Not on file  ?Occupational History  ? Not on file  ?Tobacco Use  ? Smoking status: Every Day  ?  Packs/day: 0.50  ?  Types: Cigarettes  ? Smokeless tobacco: Never  ?Vaping Use  ? Vaping Use: Former  ?Substance and Sexual Activity  ? Alcohol use: No  ? Drug  use: No  ? Sexual activity: Not on file  ?Other Topics Concern  ? Not on file  ?Social History Narrative  ? Right handed   ? Lives with daughter  ? Caffeine use: Drinks coffee/tea/soda sometimes  ? ?Social Determinants of Health  ? ?Financial Resource Strain: Not on file  ?Food Insecurity: Not on file  ?Transportation Needs: Not on file  ?Physical Activity: Not on file  ?Stress: Not on file  ?Social Connections: Not on file  ? ? ?Allergies:  ?Allergies  ?Allergen Reactions  ? Lisinopril   ?  angioedema  ? Trulicity [Dulaglutide] Other (See Comments)  ?  Gas,  abdominal bloating  ? ? ?Metabolic Disorder Labs: ?Lab Results  ?Component Value Date  ? HGBA1C 10.0 (A) 05/18/2021  ? ?No results found for: PROLACTIN ?Lab Results  ?Component Value Date  ? CHOL 222 (H) 01/07/2021  ? TRIG 121 01/07/2021  ? HDL 42 01/07/2021  ? CHOLHDL 5.3 (H) 01/07/2021  ? LDLCALC 158 (H) 01/07/2021  ? LDLCALC 125 (H) 08/01/2018  ? ?Lab Results  ?Component Value Date  ? TSH 2.870 10/02/2018  ? TSH 4.840 (H) 01/09/2018  ? ? ?Therapeutic Level Labs: ?No results found for: LITHIUM ?No results found for: VALPROATE ?No components found for:  CBMZ ? ?Current Medications: ?Current Outpatient Medications  ?Medication Sig Dispense Refill  ? Accu-Chek Softclix Lancets lancets Use as instructed 100 each 12  ? albuterol (VENTOLIN HFA) 108 (90 Base) MCG/ACT inhaler Inhale 1-2 puffs into the lungs every 6 (six) hours as needed for wheezing or shortness of breath. 8 each 0  ? albuterol (VENTOLIN HFA) 108 (90 Base) MCG/ACT inhaler Inhale 1-2 puffs into the lungs every 6 (six) hours as needed for up to 14 days for wheezing or shortness of breath. 1 each 0  ? amLODipine (NORVASC) 10 MG tablet Take 1 tablet (10 mg total) by mouth daily. 90 tablet 3  ? amoxicillin-clavulanate (AUGMENTIN) 875-125 MG tablet Take 1 tablet by mouth every 12 (twelve) hours. 14 tablet 0  ? aspirin EC 81 MG tablet Take 81 mg by mouth every 6 (six) hours as needed for mild pain.     ? atorvastatin (LIPITOR) 40 MG tablet Take 1 tablet (40 mg total) by mouth daily. 30 tablet 2  ? blood glucose meter kit and supplies Dispense based on patient and insurance preference. Use up to four times daily as directed. (FOR ICD-10 E10.9, E11.9). 1 each 0  ? DULoxetine (CYMBALTA) 60 MG capsule TAKE 1 CAPSULE (60 MG TOTAL) BY MOUTH 2 (TWO) TIMES DAILY (AM+BEDTIME) 90 capsule 3  ? fluticasone (FLONASE) 50 MCG/ACT nasal spray Place 2 sprays into both nostrils daily. 16 mL 0  ? fluticasone (FLONASE) 50 MCG/ACT nasal spray Place 2 sprays into both nostrils daily  for 14 days. 16 g 0  ? fluticasone (FLOVENT HFA) 44 MCG/ACT inhaler Inhale 2 puffs into the lungs 2 (two) times daily. 1 Inhaler 12  ? gabapentin (NEURONTIN) 300 MG capsule TAKE ONE CAPSULE BY MOUTH THREE TIMES DAILY ( AM+NOON+BEDTIME) 90 capsule 0  ? glucose blood (ACCU-CHEK GUIDE) test strip 1 each by Other route 3 (three) times daily after meals. Use as instructed 100 each 12  ? HYDROcodone-acetaminophen (NORCO) 5-325 MG tablet Take 1 tablet by mouth every 4 (four) hours as needed for moderate pain. 10 tablet 0  ? ibuprofen (ADVIL) 800 MG tablet TAKE 1 TABLET (800 MG TOTAL) BY MOUTH EVERY 8 (EIGHT) HOURS AS NEEDED. 90 tablet 1  ? Insulin  Pen Needle 31G X 8 MM MISC Use to inject Levemir BID. 100 each 2  ? Lancets (ACCU-CHEK SOFT TOUCH) lancets Use as instructed 100 each 12  ? LANTUS SOLOSTAR 100 UNIT/ML Solostar Pen INJECT 80 UNITS INTO THE SKIN DAILY. 30 mL 2  ? losartan (COZAAR) 50 MG tablet Take 1 tablet (50 mg total) by mouth daily. 90 tablet 1  ? metFORMIN (GLUCOPHAGE) 1000 MG tablet TAKE ONE TABLET BY MOUTH TWICE DAILY (AM+BEDTIME) 180 tablet 0  ? ondansetron (ZOFRAN) 4 MG tablet Take 1 tablet (4 mg total) by mouth every 8 (eight) hours as needed for nausea or vomiting. 20 tablet 0  ? pantoprazole (PROTONIX) 40 MG tablet TAKE 1 TABLET (40 MG TOTAL) BY MOUTH DAILY (AM) 30 tablet 3  ? traZODone (DESYREL) 100 MG tablet Take 1 tablet (100 mg total) by mouth at bedtime. 30 tablet 2  ? ?No current facility-administered medications for this visit.  ? ? ? ?Musculoskeletal: ?Strength & Muscle Tone: N/A virtual visit ?Gait & Station: N/A ?Patient leans: N/A ? ?Psychiatric Specialty Exam: ?Review of Systems  ?Psychiatric/Behavioral:  Negative for hallucinations, self-injury and suicidal ideas. The patient is not nervous/anxious and is not hyperactive.   ?All other systems reviewed and are negative.  ?There were no vitals taken for this visit.There is no height or weight on file to calculate BMI.  ?General Appearance:  NA  ?Eye Contact:  NA  ?Speech:  Clear and Coherent  ?Volume:  Normal  ?Mood:  Euthymic  ?Affect:  NA  ?Thought Process:  Coherent  ?Orientation:  Full (Time, Place, and Person)  ?Thought Content: Logical   ?S

## 2021-08-04 NOTE — Telephone Encounter (Signed)
Routed to PCP 

## 2021-08-10 ENCOUNTER — Other Ambulatory Visit: Payer: Self-pay

## 2021-08-10 ENCOUNTER — Ambulatory Visit (INDEPENDENT_AMBULATORY_CARE_PROVIDER_SITE_OTHER): Payer: Self-pay | Admitting: *Deleted

## 2021-08-10 ENCOUNTER — Encounter (HOSPITAL_COMMUNITY): Payer: Self-pay | Admitting: *Deleted

## 2021-08-10 ENCOUNTER — Emergency Department (HOSPITAL_COMMUNITY)
Admission: EM | Admit: 2021-08-10 | Discharge: 2021-08-10 | Disposition: A | Discharge status: Home / Self Care | Payer: Medicaid Other | Attending: Emergency Medicine | Admitting: Emergency Medicine

## 2021-08-10 DIAGNOSIS — R1084 Generalized abdominal pain: Secondary | ICD-10-CM | POA: Diagnosis not present

## 2021-08-10 DIAGNOSIS — Z794 Long term (current) use of insulin: Secondary | ICD-10-CM | POA: Diagnosis not present

## 2021-08-10 DIAGNOSIS — N83201 Unspecified ovarian cyst, right side: Secondary | ICD-10-CM | POA: Diagnosis not present

## 2021-08-10 DIAGNOSIS — R1013 Epigastric pain: Secondary | ICD-10-CM | POA: Diagnosis present

## 2021-08-10 DIAGNOSIS — R9431 Abnormal electrocardiogram [ECG] [EKG]: Secondary | ICD-10-CM | POA: Diagnosis not present

## 2021-08-10 LAB — CBC WITH DIFFERENTIAL/PLATELET
Abs Immature Granulocytes: 0.05 10*3/uL (ref 0.00–0.07)
Basophils Absolute: 0 10*3/uL (ref 0.0–0.1)
Basophils Relative: 0 %
Eosinophils Absolute: 0.1 10*3/uL (ref 0.0–0.5)
Eosinophils Relative: 1 %
HCT: 43.3 % (ref 36.0–46.0)
Hemoglobin: 14.6 g/dL (ref 12.0–15.0)
Immature Granulocytes: 0 %
Lymphocytes Relative: 36 %
Lymphs Abs: 4.7 10*3/uL — ABNORMAL HIGH (ref 0.7–4.0)
MCH: 25.3 pg — ABNORMAL LOW (ref 26.0–34.0)
MCHC: 33.7 g/dL (ref 30.0–36.0)
MCV: 74.9 fL — ABNORMAL LOW (ref 80.0–100.0)
Monocytes Absolute: 0.7 10*3/uL (ref 0.1–1.0)
Monocytes Relative: 5 %
Neutro Abs: 7.5 10*3/uL (ref 1.7–7.7)
Neutrophils Relative %: 58 %
Platelets: 273 10*3/uL (ref 150–400)
RBC: 5.78 MIL/uL — ABNORMAL HIGH (ref 3.87–5.11)
RDW: 14.6 % (ref 11.5–15.5)
WBC: 13.1 10*3/uL — ABNORMAL HIGH (ref 4.0–10.5)
nRBC: 0 % (ref 0.0–0.2)

## 2021-08-10 LAB — URINALYSIS, ROUTINE W REFLEX MICROSCOPIC
Bilirubin Urine: NEGATIVE
Glucose, UA: NEGATIVE mg/dL
Ketones, ur: NEGATIVE mg/dL
Nitrite: NEGATIVE
Protein, ur: NEGATIVE mg/dL
Specific Gravity, Urine: 1.019 (ref 1.005–1.030)
pH: 5 (ref 5.0–8.0)

## 2021-08-10 LAB — COMPREHENSIVE METABOLIC PANEL
ALT: 32 U/L (ref 0–44)
AST: 26 U/L (ref 15–41)
Albumin: 3.5 g/dL (ref 3.5–5.0)
Alkaline Phosphatase: 135 U/L — ABNORMAL HIGH (ref 38–126)
Anion gap: 7 (ref 5–15)
BUN: 8 mg/dL (ref 6–20)
CO2: 26 mmol/L (ref 22–32)
Calcium: 9.8 mg/dL (ref 8.9–10.3)
Chloride: 108 mmol/L (ref 98–111)
Creatinine, Ser: 0.87 mg/dL (ref 0.44–1.00)
GFR, Estimated: >60 mL/min (ref >60)
Glucose, Bld: 198 mg/dL — ABNORMAL HIGH (ref 70–99)
Potassium: 4.3 mmol/L (ref 3.5–5.1)
Sodium: 141 mmol/L (ref 135–145)
Total Bilirubin: 0.5 mg/dL (ref 0.3–1.2)
Total Protein: 7.3 g/dL (ref 6.5–8.1)

## 2021-08-10 LAB — LIPASE, BLOOD: Lipase: 31 U/L (ref 11–51)

## 2021-08-10 MED ORDER — DICYCLOMINE HCL 10 MG PO CAPS
10.0000 mg | ORAL_CAPSULE | Freq: Once | ORAL | Status: DC
Start: 2021-08-10 — End: 2021-08-11

## 2021-08-10 MED ORDER — OXYCODONE-ACETAMINOPHEN 5-325 MG PO TABS
1.0000 | ORAL_TABLET | Freq: Once | ORAL | Status: AC
  Administered 2021-08-10: 1 via ORAL
  Filled 2021-08-10: qty 1

## 2021-08-10 MED ORDER — KETOROLAC TROMETHAMINE 15 MG/ML IJ SOLN
15.0000 mg | Freq: Once | INTRAMUSCULAR | Status: DC
  Filled 2021-08-10: qty 1

## 2021-08-10 MED ORDER — HYDROCODONE-ACETAMINOPHEN 5-325 MG PO TABS
1.0000 | ORAL_TABLET | Freq: Once | ORAL | Status: DC
  Filled 2021-08-10: qty 1

## 2021-08-10 MED ORDER — ONDANSETRON 4 MG PO TBDP: ORAL_TABLET | ORAL | 0 refills | Status: DC

## 2021-08-10 NOTE — Telephone Encounter (Signed)
?  Chief Complaint: Having black stools for the last week or two with severe abd and lower back pain.   Also fishy odor to urine. ?Symptoms: 10/10 on pain scale of pain upper and lower abd and all the way across her lower back.  C/o being very tired. ?Frequency: for at least a week maybe 2 weeks the black stools. ?Pertinent Negatives: Patient denies Having black stools in the past. ?Disposition: [x] ED /[] Urgent Care (no appt availability in office) / [] Appointment(In office/virtual)/ []  [facility]/ [] Home Care/ [] Refused Recommended Disposition /[] [facility]/ []  Follow-up with PCP ?Additional Notes: Pt agreeable to going to ED.  Having someone take her to [facility] now.   Message sent to , NP.  ?

## 2021-08-10 NOTE — Telephone Encounter (Signed)
FYI

## 2021-08-10 NOTE — Discharge Instructions (Signed)
Try pepcid or tagamet up to twice a day.  Try to avoid things that may make this worse, most commonly these are spicy foods tomato based products fatty foods chocolate and peppermint.  Alcohol and tobacco can also make this worse.  Return to the emergency department for sudden worsening pain fever or inability to eat or drink. ?You can either follow the instructions on the bottle for MiraLAX or you can alternatively ?Take 8 scoops of miralax in 32oz of whatever you would like to drink.(Gatorade comes in this size) You can also use a fleets enema which you can buy over the counter at the pharmacy.  Return for worsening abdominal pain, vomiting or fever. ? ?

## 2021-08-10 NOTE — ED Triage Notes (Signed)
The pt is c/o abd pain for 6 weeks no fever  she has not seen anyone for this  lmp ?

## 2021-08-10 NOTE — ED Provider Triage Note (Signed)
Emergency Medicine Provider Triage Evaluation Note ? ?[name] , a 51 y.o. female  was evaluated in triage.  Pt complains of bilateral low back pain that wraps into her lower abdomen through the epigastric region.  States that she has tried ibuprofen, Tylenol, hydrocodone, Goody powders with no relief at home.  Patient states the pain has been ongoing for between 2 to 6 weeks..  Patient denies nausea vomiting.  Endorses dysuria, strong smell to urine, abdominal pain, low back pain.  Denies shortness of breath, denies chest pain.  Patient states that she has had tubal ligation but is sexually active ? ?Review of Systems  ?Positive: Abdominal pain, back pain ?Negative: Chest pain, shortness of breath ? ?Physical Exam  ?Temp 98.1 ?F (36.7 ?C)   Ht 5\' 6"  (1.676 m)   Wt 107.2 kg   SpO2 100%   BMI 38.15 kg/m?  ?Gen:   Awake, no distress   ?Resp:  Normal effort  ?MSK:   Moves extremities without difficulty  ?Other:   ? ?Medical Decision Making  ?Medically screening exam initiated at 3:22 PM.  Appropriate orders placed.  [name] was informed that the remainder of the evaluation will be completed by another provider, this initial triage assessment does not replace that evaluation, and the importance of remaining in the ED until their evaluation is complete. ? ? ?  ?[name], PA-C ?08/10/21 1524 ? ?

## 2021-08-10 NOTE — ED Provider Notes (Signed)
?[facility] ?Provider Note ? ? ?CSN: [id number] ?Arrival date & time: 08/10/21  1435 ? ?  ? ?History ? ?Chief Complaint  ?Patient presents with  ? Abdominal Pain  ? ? ?[name] is a 51 y.o. female. ? ?51 yo F with a chief complaints of abdominal discomfort.  This is mostly to the epigastrium.  Is been going on for many weeks now.  Nothing seems to make it better or worse though seems to get a little bit worse after eating and noticed some abdominal bloating.  She also feels like her stool is gotten a bit dark.  She has been taking Pepto-Bismol but without improvement.  Has had nausea but no vomiting.  No fevers or chills.  She thinks she had a colonoscopy when she was in prison but does not know the results of what that was this was about 7 or 8 years ago. ? ? ?Abdominal Pain ? ?  ? ?Home Medications ?Prior to Admission medications   ?Medication Sig Start Date End Date Taking? Authorizing Provider  ?Accu-Chek Softclix Lancets lancets Use as instructed 12/29/19   Kerin Perna, NP  ?albuterol (VENTOLIN HFA) 108 (90 Base) MCG/ACT inhaler Inhale 1-2 puffs into the lungs every 6 (six) hours as needed for wheezing or shortness of breath. 11/05/20   Hughie Closs, PA-C  ?albuterol (VENTOLIN HFA) 108 (90 Base) MCG/ACT inhaler Inhale 1-2 puffs into the lungs every 6 (six) hours as needed for up to 14 days for wheezing or shortness of breath. 06/07/21 06/21/21  Leath-Warren, Alda Lea, NP  ?amLODipine (NORVASC) 10 MG tablet Take 1 tablet (10 mg total) by mouth daily. 01/07/21   Kerin Perna, NP  ?amoxicillin-clavulanate (AUGMENTIN) 875-125 MG tablet Take 1 tablet by mouth every 12 (twelve) hours. 04/17/21   Ward, Lenise Arena, PA-C  ?aspirin EC 81 MG tablet Take 81 mg by mouth every 6 (six) hours as needed for mild pain.     [provider]  ?atorvastatin (LIPITOR) 40 MG tablet Take 1 tablet (40 mg total) by mouth daily. 02/15/21   Charlott Rakes, MD  ?blood  glucose meter kit and supplies Dispense based on patient and insurance preference. Use up to four times daily as directed. (FOR ICD-10 E10.9, E11.9). 01/05/20   Kerin Perna, NP  ?DULoxetine (CYMBALTA) 60 MG capsule TAKE 1 CAPSULE (60 MG TOTAL) BY MOUTH 2 (TWO) TIMES DAILY (AM+BEDTIME) 08/03/21   Penn, Lunette Stands, NP  ?fluticasone (FLONASE) 50 MCG/ACT nasal spray Place 2 sprays into both nostrils daily. 11/05/20   Hughie Closs, PA-C  ?fluticasone (FLONASE) 50 MCG/ACT nasal spray Place 2 sprays into both nostrils daily for 14 days. 06/07/21 06/21/21  Leath-Warren, Alda Lea, NP  ?fluticasone (FLOVENT HFA) 44 MCG/ACT inhaler Inhale 2 puffs into the lungs 2 (two) times daily. 01/19/19   Kerin Perna, NP  ?gabapentin (NEURONTIN) 300 MG capsule TAKE ONE CAPSULE BY MOUTH THREE TIMES DAILY ( AM+NOON+BEDTIME) 08/04/21   Kerin Perna, NP  ?glucose blood (ACCU-CHEK GUIDE) test strip 1 each by Other route 3 (three) times daily after meals. Use as instructed 10/26/20   Kerin Perna, NP  ?HYDROcodone-acetaminophen (NORCO) 5-325 MG tablet Take 1 tablet by mouth every 4 (four) hours as needed for moderate pain. 7/41/63   Delora Fuel, MD  ?ibuprofen (ADVIL) 800 MG tablet TAKE 1 TABLET (800 MG TOTAL) BY MOUTH EVERY 8 (EIGHT) HOURS AS NEEDED. 06/06/21   Kerin Perna, NP  ?Insulin Pen Needle  31G X 8 MM MISC Use to inject Levemir BID. 05/21/20   Charlott Rakes, MD  ?Lancets (ACCU-CHEK SOFT TOUCH) lancets Use as instructed 12/29/19   Kerin Perna, NP  ?LANTUS SOLOSTAR 100 UNIT/ML Solostar Pen INJECT 80 UNITS INTO THE SKIN DAILY. 06/07/21   Charlott Rakes, MD  ?losartan (COZAAR) 50 MG tablet TAKE 1 TABLET (50 MG TOTAL) BY MOUTH DAILY (AM) 08/04/21   Kerin Perna, NP  ?metFORMIN (GLUCOPHAGE) 1000 MG tablet TAKE ONE TABLET BY MOUTH TWICE DAILY (AM+BEDTIME) 03/01/21   Kerin Perna, NP  ?ondansetron (ZOFRAN) 4 MG tablet Take 1 tablet (4 mg total) by mouth every 8 (eight) hours as needed for  nausea or vomiting. 02/15/21   Charlott Rakes, MD  ?pantoprazole (PROTONIX) 40 MG tablet TAKE 1 TABLET (40 MG TOTAL) BY MOUTH DAILY (AM) 08/04/21   Kerin Perna, NP  ?traZODone (DESYREL) 100 MG tablet Take 1 tablet (100 mg total) by mouth at bedtime. 08/03/21   Franne Grip, NP  ?   ? ?Allergies    ?Lisinopril and Trulicity [dulaglutide]   ? ?Review of Systems   ?Review of Systems  ?Gastrointestinal:  Positive for abdominal pain.  ? ?Physical Exam ?Updated Vital Signs ?BP (!) 147/105   Pulse 84   Temp 98.1 ?F (36.7 ?C)   Resp 16   Ht '5\' 6"'  (1.676 m)   Wt 107.2 kg   SpO2 99%   BMI 38.15 kg/m?  ?Physical Exam ?Vitals and nursing note reviewed.  ?Constitutional:   ?   General: She is not in acute distress. ?   Appearance: She is well-developed. She is not diaphoretic.  ?HENT:  ?   Head: Normocephalic and atraumatic.  ?Eyes:  ?   Pupils: Pupils are equal, round, and reactive to light.  ?Cardiovascular:  ?   Rate and Rhythm: Normal rate and regular rhythm.  ?   Heart sounds: No murmur heard. ?  No friction rub. No gallop.  ?Pulmonary:  ?   Effort: Pulmonary effort is normal.  ?   Breath sounds: No wheezing or rales.  ?Abdominal:  ?   General: There is no distension.  ?   Palpations: Abdomen is soft.  ?   Tenderness: There is no abdominal tenderness.  ?   Comments: Mild epigastric tenderness.  Negative Murphy sign.  ?Musculoskeletal:     ?   General: No tenderness.  ?   Cervical back: Normal range of motion and neck supple.  ?Skin: ?   General: Skin is warm and dry.  ?Neurological:  ?   Mental Status: She is alert and oriented to person, place, and time.  ?Psychiatric:     ?   Behavior: Behavior normal.  ? ? ?ED Results / Procedures / Treatments   ?Labs ?(all labs ordered are listed, but only abnormal results are displayed) ?Labs Reviewed  ?CBC WITH DIFFERENTIAL/PLATELET - Abnormal; Notable for the following components:  ?    Result Value  ? WBC 13.1 (*)   ? RBC 5.78 (*)   ? MCV 74.9 (*)   ? MCH 25.3 (*)   ?  Lymphs Abs 4.7 (*)   ? All other components within normal limits  ?COMPREHENSIVE METABOLIC PANEL - Abnormal; Notable for the following components:  ? Glucose, Bld 198 (*)   ? Alkaline Phosphatase 135 (*)   ? All other components within normal limits  ?URINALYSIS, ROUTINE W REFLEX MICROSCOPIC - Abnormal; Notable for the following components:  ? Hgb urine dipstick SMALL (*)   ?  Leukocytes,Ua TRACE (*)   ? Bacteria, UA RARE (*)   ? All other components within normal limits  ?LIPASE, BLOOD  ? ? ?EKG ?EKG Interpretation ? ?Date/Time:  Wednesday Aug 10 2021 15:49:01 EDT ?Ventricular Rate:  64 ?PR Interval:  162 ?QRS Duration: 82 ?QT Interval:  392 ?QTC Calculation: 404 ?R Axis:   54 ?Text Interpretation: Normal sinus rhythm Possible Left atrial enlargement Cannot rule out Anterior infarct , age undetermined Abnormal ECG No significant change since last tracing Confirmed by Deno Etienne [phone]) on 08/10/2021 9:23:55 PM ? ?Radiology ?No results found. ? ?Procedures ?Procedures  ? ? ?Medications Ordered in ED ?Medications  ?HYDROcodone-acetaminophen (NORCO/VICODIN) 5-325 MG per tablet 1 tablet (has no administration in time range)  ?ketorolac (TORADOL) 15 MG/ML injection 15 mg (has no administration in time range)  ?oxyCODONE-acetaminophen (PERCOCET/ROXICET) 5-325 MG per tablet 1 tablet (1 tablet Oral Given 08/10/21 1527)  ? ? ?ED Course/ Medical Decision Making/ A&P ?  ?                        ?Medical Decision Making ?Risk ?Prescription drug management. ? ? ?50 yo F with a chief complaint of diffuse abdominal discomfort.  This has been an ongoing issue for her.  Going on for many weeks.  She has a fairly benign abdominal exam.  Complaining mostly of epigastric abdominal discomfort.  I think most likely this is reflux by history and physical.  She is also complaining that she has been constipated for a while as well.  We will give her a dose of Toradol here.  Trial of H2 blockers.  MiraLAX cleanout.  GI follow-up. ? ?10:01 PM:  I  have discussed the diagnosis/risks/treatment options with the patient.  Evaluation and diagnostic testing in the emergency department does not suggest an emergent condition requiring admission or immediate interven

## 2021-08-10 NOTE — Telephone Encounter (Signed)
Reason for Disposition ? Black or tarry bowel movements (Exception: chronic-unchanged black-grey bowel movements AND is taking iron pills or Pepto-bismol) ? ?Answer Assessment - Initial Assessment Questions ?1. COLOR: "What color is it?" "Is that color in part or all of the stool?" ?    Having black stools for a week now or 2.   I'm having sharp pains in lower back and into the top of my vagina and top of my stomach and lower stomach.  No vomiting or diarrhea.   I have nausea but nothing comes up.   I'm short of breath and tired feeling.   ?2. ONSET: "When was the unusual color first noted?" ?    1-2 weeks ago ?3. CAUSE: "Have you eaten any food or taken any medicine of this color?" (See listing in BACKGROUND) ?    I took [employer] yesterday and today.    My stomach was rolling.    Stools were black before taking the Pepto Bismal.    ?4. OTHER SYMPTOMS: "Do you have any other symptoms?" (e.g., diarrhea, jaundice, abdominal pain, fever). ?    Feeling tired, stomach hurting upper and lower portions.    ?I have a fishy odor smell in my urine.   I have urinary frequency and burning too.   The back pain is across my whole lower back.   I am hot and cold ? ?Answer Assessment - Initial Assessment Questions ?1. APPEARANCE of BLOOD: "What color is it?" "Is it passed separately, on the surface of the stool, or mixed in with the stool?"  ?    Black stool for 1-[redacted] weeks along with 10/10 on pain scale of pain.   Also pain in lower back on both sides and a fishy odor to my urine.   Having upper and lower abd pain 10/10. ?2. AMOUNT: "How much blood was passed?"  ?    Having black stools before taking Pepto Bismal yesterday and today for the stomach pain. ?3. FREQUENCY: "How many times has blood been passed with the stools?"  ?    *No Answer* ?4. ONSET: "When was the blood first seen in the stools?" (Days or weeks)  ?    1-2 weeks ago ?5. DIARRHEA: "Is there also some diarrhea?" If Yes, ask: "How many diarrhea stools in the past  24 hours?"  ?    Not asked ?6. CONSTIPATION: "Do you have constipation?" If Yes, ask: "How bad is it?" ?    Not asked ?7. RECURRENT SYMPTOMS: "Have you had blood in your stools before?" If Yes, ask: "When was the last time?" and "What happened that time?"  ?    No ?8. BLOOD THINNERS: "Do you take any blood thinners?" (e.g., Coumadin/warfarin, Pradaxa/dabigatran, aspirin) ?    *No Answer* ?9. OTHER SYMPTOMS: "Do you have any other symptoms?"  (e.g., abdomen pain, vomiting, dizziness, fever) ?    *No Answer* ?10. PREGNANCY: "Is there any chance you are pregnant?" "When was your last menstrual period?" ?      *No Answer* ? ?Protocols used: Stools - Unusual Color-A-AH, Rectal Bleeding-A-AH ? ?

## 2021-08-11 ENCOUNTER — Ambulatory Visit (INDEPENDENT_AMBULATORY_CARE_PROVIDER_SITE_OTHER): Payer: Medicaid Other | Admitting: Primary Care

## 2021-09-01 IMAGING — DX DG CHEST 1V PORT
1 series · 1 of 1 positions shown · non-contrast
Comparison: 06/21/2019

CLINICAL DATA: Headache, chest tightness, sore throat, chills

EXAM:
PORTABLE CHEST 1 VIEW

[chest ap]
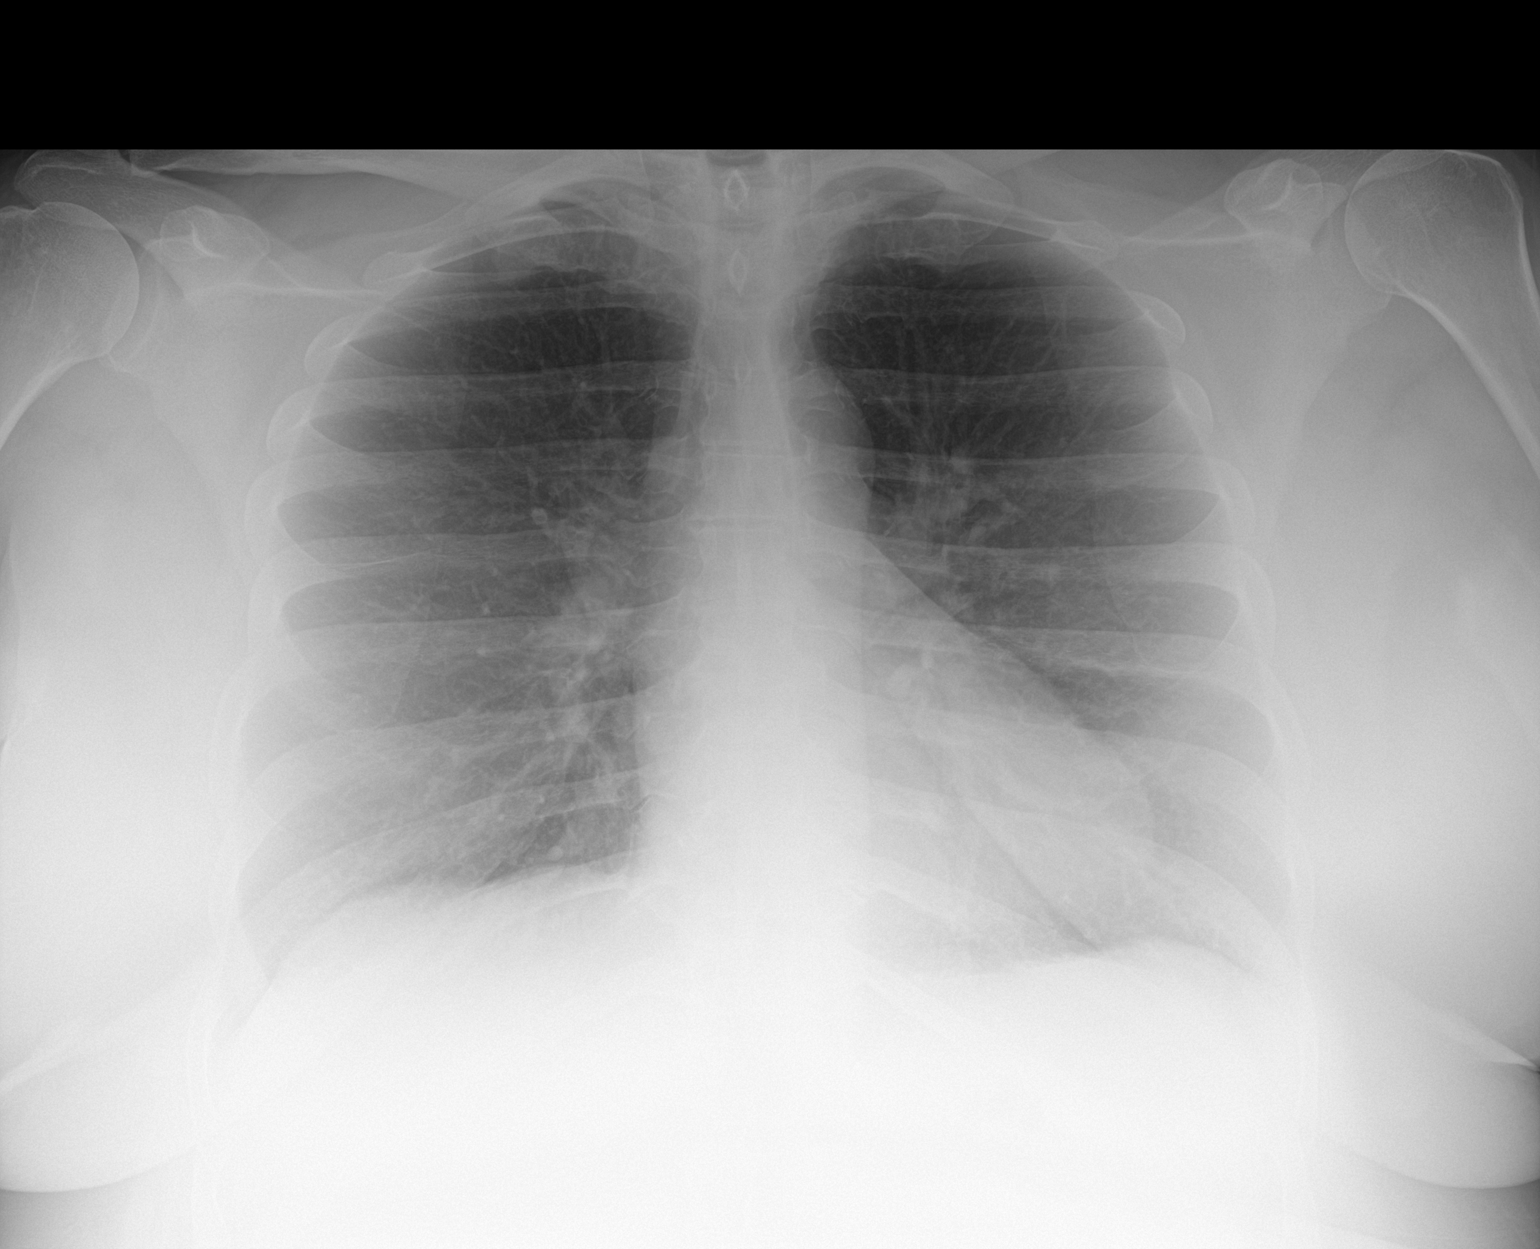

[1 of 1 positions shown; findings below may reference images not displayed]

FINDINGS: The heart size and mediastinal contours are within normal limits.
Both lungs are clear. The visualized skeletal structures are
unremarkable.
IMPRESSION: No active disease.

## 2021-09-08 ENCOUNTER — Ambulatory Visit (INDEPENDENT_AMBULATORY_CARE_PROVIDER_SITE_OTHER): Payer: Medicaid Other | Admitting: Primary Care

## 2021-09-15 ENCOUNTER — Telehealth (INDEPENDENT_AMBULATORY_CARE_PROVIDER_SITE_OTHER): Payer: Self-pay | Admitting: Primary Care

## 2021-09-15 NOTE — Telephone Encounter (Signed)
Copied from CRM [id number]. Topic: General - Other >> Sep 15, 2021  8:17 AM De Blanch wrote: Reason for CRM: Pt requesting a call back to schedule an appointment with [facility].Marland Kitchen  Please advise.

## 2021-09-15 NOTE — Telephone Encounter (Signed)
Would you call patient and schedule her with luke.

## 2021-09-16 ENCOUNTER — Ambulatory Visit: Payer: Medicaid Other | Attending: Primary Care | Admitting: Pharmacist

## 2021-09-16 ENCOUNTER — Encounter: Payer: Self-pay | Admitting: Pharmacist

## 2021-09-16 DIAGNOSIS — Z794 Long term (current) use of insulin: Secondary | ICD-10-CM | POA: Diagnosis not present

## 2021-09-16 DIAGNOSIS — E119 Type 2 diabetes mellitus without complications: Secondary | ICD-10-CM | POA: Diagnosis not present

## 2021-09-16 LAB — POCT GLYCOSYLATED HEMOGLOBIN (HGB A1C): HbA1c, POC (controlled diabetic range): 8.5 % — AB (ref 0.0–7.0)

## 2021-09-16 MED ORDER — LANTUS SOLOSTAR 100 UNIT/ML ~~LOC~~ SOPN: 70.0000 [IU] | PEN_INJECTOR | Freq: Every day | SUBCUTANEOUS | 2 refills | Status: DC

## 2021-09-16 MED ORDER — TRULICITY 0.75 MG/0.5ML ~~LOC~~ SOAJ: 0.7500 mg | SUBCUTANEOUS | 2 refills | Status: DC

## 2021-09-16 NOTE — Progress Notes (Signed)
   PCP Gwinda Passe  S:    Presents for diabetes evaluation, education, and management. Patient was referred and last seen by Primary Care Provider on 01/07/2021 by Gwinda Passe. Last seen by pharmacist 05/18/2021 where BG was at goal and no changes were made to DM regimen. Since that visit, patient has presented to ED multiple times for abdominal pains.  Today, she presents in good spirits. Patient request to be re-started on trulicity as she felt her blood glucose and leg pain was improved while on trulicity. Reports that her abdominal pain has now resolved. Also, she expressed potentially needing medication changes for her depression and did not feel like her medications were working as well for her.   Medication adherence reported. Patient denies hypoglycemic events. Denies headache, blurry vision, or polyuria.    Current diabetes medications include:  -Metformin 1000 mg twice daily -Lantus 80 units daily (morning)  Lifestyle: did not discuss exercise and diet at this appointment.    Family/Social History:  Tobacco: Current smoker Family history of DM, renal disease, and cancer  O:  Lab Results  Component Value Date   HGBA1C 8.5 (A) 09/16/2021   There were no vitals filed for this visit.  Lipid Panel     Component Value Date/Time   CHOL 222 (H) 01/07/2021 1128   TRIG 121 01/07/2021 1128   HDL 42 01/07/2021 1128   CHOLHDL 5.3 (H) 01/07/2021 1128   LDLCALC 158 (H) 01/07/2021 1128   Home fasting blood sugars: Denies any readings >120. Lowest reading is 87.   Clinical Atherosclerotic Cardiovascular Disease (ASCVD): No  The 10-year ASCVD risk score (Arnett DK, et al., 2019) is: 43.6%   Values used to calculate the score:     Age: 51 years     Sex: Female     Is Non-Hispanic African American: Yes     Diabetic: Yes     Tobacco smoker: Yes     Systolic Blood Pressure: 150 mmHg     Is BP treated: Yes     HDL Cholesterol: 42 mg/dL     Total Cholesterol: 222 mg/dL    A/P: Diabetes longstanding. Patient is able to verbalize appropriate hypoglycemia management plan. Medication adherence appears adequate. A1c improved today (8.5 from 10.0 on 05/2021).  -Start trulicity 0.75mg  once weekly starting Saturday. No evidence of pancreatitis noted on previous ED work-ups. Discussed the potential for GI side effects and educated to call if experiencing any side effects.  -Decrease Lantus 70 units daily. -Continue metformin 1000 mg BID.  -Could consider adding SGLT-2 to regimen at next visit now that A1c is <9. This may allow to further decrease the insulin daily requirements.  -Extensively discussed pathophysiology of diabetes, recommended lifestyle interventions, dietary effects on blood sugar control -Counseled on s/sx of and management of hypoglycemia -Next A1C anticipated 12/2021.  -Encouraged patient to follow up with [facility] about depression medications. Gave office number and date of upcoming appointment.   Written patient instructions provided.  Total time in face to face counseling 30 minutes.   Follow up Pharmacist Clinic Visit in 3-4 weeks.   Thank you for allowing pharmacy to participate in this patient's care.  Marja Kays, PharmD PGY1 Acute Care Resident  09/16/2021,4:56 PM

## 2021-09-26 ENCOUNTER — Ambulatory Visit (INDEPENDENT_AMBULATORY_CARE_PROVIDER_SITE_OTHER): Payer: Medicaid Other | Admitting: Primary Care

## 2021-09-26 DIAGNOSIS — N898 Other specified noninflammatory disorders of vagina: Secondary | ICD-10-CM | POA: Diagnosis not present

## 2021-09-26 NOTE — Progress Notes (Signed)
[facility]  Virtual Visit via Telephone Note  I connected with Nandana Krolikowski, on 09/26/2021 at 2:59 PM  telephone and verified that I am speaking with the correct person using two identifiers.   Consent: I discussed the limitations, risks, security and privacy concerns of performing an evaluation and management service by telephone and the availability of in person appointments. I also discussed with the patient that there may be a patient responsible charge related to this service. The patient expressed understanding and agreed to proceed.   Location of Patient: HOME  Location of Provider: [facility] [place] participating in Telemedicine visit: Delicia Berens Juluis Mire,  NP [place]ana Aliment , CMA  History of Present Illness: [name] is a 51 year old female having vaginal irritation and odor. She does admit to changing soaps from Dial antibacteria to [place]. The problem started after the switch. Explain fragrances can cause irritation do not use in genital area.She is requesting a pap smear will have schedule. States has only one female sexual partner. She is c/o bilateral knee pain and requesting refill on ibuprofen. Patient has No headache, No chest pain, No abdominal pain - No Nausea, No new weakness tingling or numbness, No Cough - shortness of breath    Past Medical History:  Diagnosis Date   Anxiety    Common migraine with intractable migraine 10/02/2016   Depression    Diabetes mellitus    Hypertension    Allergies  Allergen Reactions   Lisinopril     angioedema   Trulicity [Dulaglutide] Other (See Comments)    Gas, abdominal bloating    Current Outpatient Medications on File Prior to Visit  Medication Sig Dispense Refill   Accu-Chek Softclix Lancets lancets Use as instructed 100 each 12   albuterol (VENTOLIN HFA) 108 (90 Base) MCG/ACT inhaler Inhale 1-2 puffs into the lungs every 6  (six) hours as needed for wheezing or shortness of breath. 8 each 0   amLODipine (NORVASC) 10 MG tablet Take 1 tablet (10 mg total) by mouth daily. 90 tablet 3   amoxicillin-clavulanate (AUGMENTIN) 875-125 MG tablet Take 1 tablet by mouth every 12 (twelve) hours. 14 tablet 0   aspirin EC 81 MG tablet Take 81 mg by mouth every 6 (six) hours as needed for mild pain.      atorvastatin (LIPITOR) 40 MG tablet Take 1 tablet (40 mg total) by mouth daily. 30 tablet 2   blood glucose meter kit and supplies Dispense based on patient and insurance preference. Use up to four times daily as directed. (FOR ICD-10 E10.9, E11.9). 1 each 0   Dulaglutide (TRULICITY) 2.42 AS/3.4HD SOPN Inject 0.75 mg into the skin once a week. 2 mL 2   DULoxetine (CYMBALTA) 60 MG capsule TAKE 1 CAPSULE (60 MG TOTAL) BY MOUTH 2 (TWO) TIMES DAILY (AM+BEDTIME) 90 capsule 3   fluticasone (FLONASE) 50 MCG/ACT nasal spray Place 2 sprays into both nostrils daily. 16 mL 0   fluticasone (FLOVENT HFA) 44 MCG/ACT inhaler Inhale 2 puffs into the lungs 2 (two) times daily. 1 Inhaler 12   gabapentin (NEURONTIN) 300 MG capsule TAKE ONE CAPSULE BY MOUTH THREE TIMES DAILY ( AM+NOON+BEDTIME) 90 capsule 0   glucose blood (ACCU-CHEK GUIDE) test strip 1 each by Other route 3 (three) times daily after meals. Use as instructed 100 each 12   HYDROcodone-acetaminophen (NORCO) 5-325 MG tablet Take 1 tablet by mouth every 4 (four) hours as needed for  moderate pain. 10 tablet 0   ibuprofen (ADVIL) 800 MG tablet TAKE 1 TABLET (800 MG TOTAL) BY MOUTH EVERY 8 (EIGHT) HOURS AS NEEDED. 90 tablet 1   insulin glargine (LANTUS SOLOSTAR) 100 UNIT/ML Solostar Pen Inject 70 Units into the skin daily. 30 mL 2   Insulin Pen Needle 31G X 8 MM MISC Use to inject Levemir BID. 100 each 2   Lancets (ACCU-CHEK SOFT TOUCH) lancets Use as instructed 100 each 12   losartan (COZAAR) 50 MG tablet TAKE 1 TABLET (50 MG TOTAL) BY MOUTH DAILY (AM) 90 tablet 1   metFORMIN (GLUCOPHAGE) 1000  MG tablet TAKE ONE TABLET BY MOUTH TWICE DAILY (AM+BEDTIME) 180 tablet 0   ondansetron (ZOFRAN) 4 MG tablet Take 1 tablet (4 mg total) by mouth every 8 (eight) hours as needed for nausea or vomiting. 20 tablet 0   ondansetron (ZOFRAN-ODT) 4 MG disintegrating tablet 58m ODT q4 hours prn nausea/vomit 20 tablet 0   pantoprazole (PROTONIX) 40 MG tablet TAKE 1 TABLET (40 MG TOTAL) BY MOUTH DAILY (AM) 30 tablet 3   traZODone (DESYREL) 100 MG tablet Take 1 tablet (100 mg total) by mouth at bedtime. 30 tablet 2   albuterol (VENTOLIN HFA) 108 (90 Base) MCG/ACT inhaler Inhale 1-2 puffs into the lungs every 6 (six) hours as needed for up to 14 days for wheezing or shortness of breath. 1 each 0   fluticasone (FLONASE) 50 MCG/ACT nasal spray Place 2 sprays into both nostrils daily for 14 days. 16 g 0   No current facility-administered medications on file prior to visit.    Observations/Objective: Comprehensive ROS Pertinent positive and negative noted in HPI.  Assessment and Plan:  PRuqayawas seen today for multiple concerns.  Diagnoses and all orders for this visit:  Vaginal irritation Discussed probable causes and schedule a pap    Follow Up Instructions: pap  I discussed the assessment and treatment plan with the patient. The patient was provided an opportunity to ask questions and all were answered. The patient agreed with the plan and demonstrated an understanding of the instructions.   The patient was advised to call back or seek an in-person evaluation if the symptoms worsen or if the condition fails to improve as anticipated.     I provided 12 minutes total of non-face-to-face time during this encounter including median intraservice time, reviewing previous notes, investigations, ordering medications, medical decision making, coordinating care and patient verbalized understanding at the end of the visit.    This note has been created with DHuman resources officer Any transcriptional errors are unintentional.   MKerin Perna NP 09/26/2021, 2:59 PM

## 2021-10-24 ENCOUNTER — Ambulatory Visit: Payer: Medicaid Other | Admitting: Pharmacist

## 2021-10-24 DIAGNOSIS — G4733 Obstructive sleep apnea (adult) (pediatric): Secondary | ICD-10-CM | POA: Diagnosis not present

## 2021-10-26 ENCOUNTER — Other Ambulatory Visit: Payer: Self-pay | Admitting: Family Medicine

## 2021-10-26 ENCOUNTER — Telehealth (INDEPENDENT_AMBULATORY_CARE_PROVIDER_SITE_OTHER): Payer: Medicaid Other | Admitting: Psychiatry

## 2021-10-26 ENCOUNTER — Encounter (HOSPITAL_COMMUNITY): Payer: Self-pay | Admitting: Psychiatry

## 2021-10-26 DIAGNOSIS — F4329 Adjustment disorder with other symptoms: Secondary | ICD-10-CM

## 2021-10-26 DIAGNOSIS — F3341 Major depressive disorder, recurrent, in partial remission: Secondary | ICD-10-CM | POA: Diagnosis not present

## 2021-10-26 MED ORDER — DULOXETINE HCL 60 MG PO CPEP: ORAL_CAPSULE | ORAL | 3 refills | Status: DC

## 2021-10-26 MED ORDER — TRAZODONE HCL 100 MG PO TABS: 100.0000 mg | ORAL_TABLET | Freq: Every day | ORAL | 3 refills | Status: DC

## 2021-10-26 NOTE — Progress Notes (Signed)
BH MD/PA/NP OP Progress Note Virtual Visit via Telephone Note  I connected with [name] on 10/26/21 at  1:30 PM EDT by telephone and verified that I am speaking with the correct person using two identifiers.  Location: Patient: home Provider: Clinic   I discussed the limitations, risks, security and privacy concerns of performing an evaluation and management service by telephone and the availability of in person appointments. I also discussed with the patient that there may be a patient responsible charge related to this service. The patient expressed understanding and agreed to proceed.   I provided 30 minutes of non-face-to-face time during this encounter.  10/26/2021 1:57 PM [name]  MRN:  [id number]  Chief Complaint: "I've been doing good"  HPI: 51 year old female seen today for follow-up psychiatric evaluation.   She has a psychiatric history of depression and prolonged grief.  Currently she is managed on trazodone 100 mg nightly and Cymbalta 60 mg twice daily.  She notes her medications are effective in managing her psychiatric conditions.  Today patient was unable to logon virtually so assessment was done over the phone.  During exam she was pleasant, cooperative, and engaged in conversation.  She informed Probation officer that she has doing good. She reports that she has been a little stressed as she recently buried her godmother who was shot and killed.  She also notes that the anniversary of her sons death is approaching.  She reports that her daughter plans to take her to the beach to alleviate some stress.  Despite current stressors patient notes that she is able to cope with her anxiety and depression.  Provider conducted a GAD-7 and patient scored a 9.  Provider also conducted PHQ-9 and patient scored a 6.  She endorses adequate sleep and appetite.  Patient notes that she has been eating healthier and has lost 5 pounds since her last visit.  She reports that her goal weight is  215 and notes that she is about 10 pounds away from reaching this goal.  Today she denies SI/HI/AVH, mania, or paranoia.  Patient informed writer that she continues to have leg and feet pain.  She reports that she has an appointment tomorrow to address these concerns.    Today no medication changes made.  She will continue medications as prescribed.  No other concerns at this time.   Visit Diagnosis:    ICD-10-CM   1. MDD (major depressive disorder), recurrent, in partial remission (HCC)  F33.41 DULoxetine (CYMBALTA) 60 MG capsule    traZODone (DESYREL) 100 MG tablet    2. 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 DULoxetine (CYMBALTA) 60 MG capsule      Past Psychiatric History:  MDD, grief with prolonged bereavement reaction  Past Medical History:  Past Medical History:  Diagnosis Date   Anxiety    Common migraine with intractable migraine 10/02/2016   Depression    Diabetes mellitus    Hypertension     Past Surgical History:  Procedure Laterality Date   CHOLECYSTECTOMY     TUBAL LIGATION      Family Psychiatric History: Denied any family psychiatric history  Family History:  Family History  Problem Relation Age of Onset   Diabetes Mother    Hypertension Mother    Renal Disease Mother    Cancer Father    Diabetes Sister    Diabetes Brother    Renal Disease Brother    Diabetes Brother    Diabetes Sister    Diabetes Brother  Diabetes Brother    Diabetes Brother    Healthy Daughter    Healthy Daughter     Social History:  Social History   Socioeconomic History   Marital status: Single    Spouse name: Not on file   Number of children: Not on file   Years of education: Not on file   Highest education level: Not on file  Occupational History   Not on file  Tobacco Use   Smoking status: Every Day    Packs/day: 0.50    Types: Cigarettes   Smokeless tobacco: Never  Vaping Use   Vaping Use: Former  Substance and Sexual Activity   Alcohol use: No    Drug use: No   Sexual activity: Not on file  Other Topics Concern   Not on file  Social History Narrative   Right handed    Lives with daughter   Caffeine use: Drinks coffee/tea/soda sometimes   Social Determinants of Radio broadcast assistant Strain: Not on file  Food Insecurity: Not on file  Transportation Needs: Not on file  Physical Activity: Not on file  Stress: Not on file  Social Connections: Not on file    Allergies:  Allergies  Allergen Reactions   Lisinopril     angioedema   Trulicity [Dulaglutide] Other (See Comments)    Gas, abdominal bloating    Metabolic Disorder Labs: Lab Results  Component Value Date   HGBA1C 8.5 (A) 09/16/2021   No results found for: "PROLACTIN" Lab Results  Component Value Date   CHOL 222 (H) 01/07/2021   TRIG 121 01/07/2021   HDL 42 01/07/2021   CHOLHDL 5.3 (H) 01/07/2021   LDLCALC 158 (H) 01/07/2021   LDLCALC 125 (H) 08/01/2018   Lab Results  Component Value Date   TSH 2.870 10/02/2018   TSH 4.840 (H) 01/09/2018    Therapeutic Level Labs: No results found for: "LITHIUM" No results found for: "VALPROATE" No results found for: "CBMZ"  Current Medications: Current Outpatient Medications  Medication Sig Dispense Refill   Accu-Chek Softclix Lancets lancets Use as instructed 100 each 12   albuterol (VENTOLIN HFA) 108 (90 Base) MCG/ACT inhaler Inhale 1-2 puffs into the lungs every 6 (six) hours as needed for wheezing or shortness of breath. 8 each 0   albuterol (VENTOLIN HFA) 108 (90 Base) MCG/ACT inhaler Inhale 1-2 puffs into the lungs every 6 (six) hours as needed for up to 14 days for wheezing or shortness of breath. 1 each 0   amLODipine (NORVASC) 10 MG tablet Take 1 tablet (10 mg total) by mouth daily. 90 tablet 3   amoxicillin-clavulanate (AUGMENTIN) 875-125 MG tablet Take 1 tablet by mouth every 12 (twelve) hours. 14 tablet 0   aspirin EC 81 MG tablet Take 81 mg by mouth every 6 (six) hours as needed for mild  pain.      atorvastatin (LIPITOR) 40 MG tablet Take 1 tablet (40 mg total) by mouth daily. 30 tablet 2   blood glucose meter kit and supplies Dispense based on patient and insurance preference. Use up to four times daily as directed. (FOR ICD-10 E10.9, E11.9). 1 each 0   Dulaglutide (TRULICITY) 3.81 WE/9.9BZ SOPN Inject 0.75 mg into the skin once a week. 2 mL 2   DULoxetine (CYMBALTA) 60 MG capsule TAKE 1 CAPSULE (60 MG TOTAL) BY MOUTH 2 (TWO) TIMES DAILY (AM+BEDTIME) 90 capsule 3   fluticasone (FLONASE) 50 MCG/ACT nasal spray Place 2 sprays into both nostrils daily. 16 mL 0  fluticasone (FLONASE) 50 MCG/ACT nasal spray Place 2 sprays into both nostrils daily for 14 days. 16 g 0   fluticasone (FLOVENT HFA) 44 MCG/ACT inhaler Inhale 2 puffs into the lungs 2 (two) times daily. 1 Inhaler 12   gabapentin (NEURONTIN) 300 MG capsule TAKE ONE CAPSULE BY MOUTH THREE TIMES DAILY ( AM+NOON+BEDTIME) 90 capsule 0   glucose blood (ACCU-CHEK GUIDE) test strip 1 each by Other route 3 (three) times daily after meals. Use as instructed 100 each 12   HYDROcodone-acetaminophen (NORCO) 5-325 MG tablet Take 1 tablet by mouth every 4 (four) hours as needed for moderate pain. 10 tablet 0   ibuprofen (ADVIL) 800 MG tablet TAKE 1 TABLET (800 MG TOTAL) BY MOUTH EVERY 8 (EIGHT) HOURS AS NEEDED. 90 tablet 1   insulin glargine (LANTUS SOLOSTAR) 100 UNIT/ML Solostar Pen Inject 70 Units into the skin daily. 30 mL 2   Insulin Pen Needle 31G X 8 MM MISC Use to inject Levemir BID. 100 each 2   Lancets (ACCU-CHEK SOFT TOUCH) lancets Use as instructed 100 each 12   losartan (COZAAR) 50 MG tablet TAKE 1 TABLET (50 MG TOTAL) BY MOUTH DAILY (AM) 90 tablet 1   metFORMIN (GLUCOPHAGE) 1000 MG tablet TAKE ONE TABLET BY MOUTH TWICE DAILY (AM+BEDTIME) 180 tablet 0   ondansetron (ZOFRAN) 4 MG tablet Take 1 tablet (4 mg total) by mouth every 8 (eight) hours as needed for nausea or vomiting. 20 tablet 0   ondansetron (ZOFRAN-ODT) 4 MG  disintegrating tablet 39m ODT q4 hours prn nausea/vomit 20 tablet 0   pantoprazole (PROTONIX) 40 MG tablet TAKE 1 TABLET (40 MG TOTAL) BY MOUTH DAILY (AM) 30 tablet 3   traZODone (DESYREL) 100 MG tablet Take 1 tablet (100 mg total) by mouth at bedtime. 30 tablet 3   No current facility-administered medications for this visit.     Musculoskeletal: Strength & Muscle Tone:  Unable to assess due to telephone visit Gait & Station:  Unable to assess due to telephone visit Patient leans: N/A  Psychiatric Specialty Exam: Review of Systems  There were no vitals taken for this visit.There is no height or weight on file to calculate BMI.  General Appearance:  Unable to assess due to telephone visit  Eye Contact:   Unable to assess due to telephone visit  Speech:  Clear and Coherent and Normal Rate  Volume:  Normal  Mood:  Euthymic  Affect:   Unable to assess due to telephone visit  Thought Process:  Coherent, Goal Directed, and Linear  Orientation:  Full (Time, Place, and Person)  Thought Content: WDL and Logical   Suicidal Thoughts:  No  Homicidal Thoughts:  No  Memory:  Immediate;   Good Recent;   Good Remote;   Good  Judgement:  Good  Insight:  Good  Psychomotor Activity:   Unable to assess due to telephone visit  Concentration:  Concentration: Good and Attention Span: Good  Recall:  Good  Fund of Knowledge: Good  Language: Good  Akathisia:  No  Handed:  Right  AIMS (if indicated): not done  Assets:  Communication Skills Desire for Improvement Financial Resources/Insurance Housing Leisure Time Physical Health Social Support  ADL's:  Intact  Cognition: WNL  Sleep:  Good   Screenings: GAD-7    [facility] 01/[facility]/2021  in [place] Video Visit from [facility]/23/2020 in [facility]  Total GAD-7 Score '9 18 11 ' 0 18      PHQ2-9    Flowsheet Row Video Visit from 10/26/2021 in [facility] Video Visit from 05/19/2021 in [facility] Video Visit from 02/23/2021 in [facility] Office Visit from 01/31/2021 in [place] Video Visit from 11/23/2020 in [facility]  PHQ-2 Total Score 0 6 4 0 4  PHQ-9 Total Score '6 17 13 ' -- 14      Flowsheet Row ED from 08/10/2021 in [facility] Most recent reading at 08/10/2021  3:20 PM ED from 07/05/2021 in [facility] Most recent reading at 07/05/2021  8:34 PM ED from 07/05/2021 in [facility] Most recent reading at 07/05/2021  7:21 PM  C-SSRS RISK CATEGORY No Risk No Risk No Risk        Assessment and Plan: Patient notes that she is doing well on her current medication regimen.  No medication changes made today.  Patient agreeable to continue medications as prescribed   1. MDD (major depressive disorder), recurrent, in partial remission (HCC)  Continue- DULoxetine (CYMBALTA) 60 MG capsule; Take 1 capsule (60 mg total) by mouth 2 (two) times daily.  Dispense: 90 capsule; Refill: 3 Continue- traZODone (DESYREL) 100 MG tablet; Take 1 tablet (100 mg total) by mouth at bedtime.  Dispense: 30 tablet; Refill: 3  2. Grief reaction with prolonged bereavement  Continue- DULoxetine (CYMBALTA) 60 MG capsule; Take 1 capsule (60 mg total) by mouth 2 (two) times daily.  Dispense: 90 capsule; Refill: 3   Follow-up in 3 months Salley Slaughter, NP 10/26/2021, 1:57 PM

## 2021-10-27 ENCOUNTER — Ambulatory Visit: Payer: Medicaid Other | Admitting: Pharmacist

## 2021-10-27 ENCOUNTER — Ambulatory Visit (INDEPENDENT_AMBULATORY_CARE_PROVIDER_SITE_OTHER): Payer: Medicaid Other | Admitting: Primary Care

## 2021-11-08 ENCOUNTER — Ambulatory Visit (INDEPENDENT_AMBULATORY_CARE_PROVIDER_SITE_OTHER): Payer: Medicaid Other | Admitting: Primary Care

## 2021-11-14 ENCOUNTER — Other Ambulatory Visit (INDEPENDENT_AMBULATORY_CARE_PROVIDER_SITE_OTHER): Payer: Self-pay | Admitting: Primary Care

## 2021-11-14 DIAGNOSIS — M2550 Pain in unspecified joint: Secondary | ICD-10-CM

## 2021-11-15 NOTE — Telephone Encounter (Signed)
Requested Prescriptions  Pending Prescriptions Disp Refills  . gabapentin (NEURONTIN) 300 MG capsule [Pharmacy Med Name: GABAPENTIN 300 MG ORAL CAPSULE] 90 capsule 2    Sig: TAKE ONE CAPSULE BY MOUTH THREE TIMES DAILY ( AM+NOON+BEDTIME)     Neurology: Anticonvulsants - gabapentin Passed - 11/14/2021 11:26 AM      Passed - Cr in normal range and within 360 days    Creatinine, Ser  Date Value Ref Range Status  08/10/2021 0.87 0.44 - 1.00 mg/dL Final         Passed - Completed PHQ-2 or PHQ-9 in the last 360 days      Passed - Valid encounter within last 12 months    Recent Outpatient Visits          1 month ago Vaginal irritation   [facility] Grayce Sessions, NP   2 months ago Type 2 diabetes mellitus without complication, with long-term current use of insulin (HCC)   [facility] [place], Jeannett Senior L, RPH-CPP   6 months ago Type 2 diabetes mellitus without complication, with long-term current use of insulin [facility])   [facility] [place], Jeannett Senior L, RPH-CPP   7 months ago Type 2 diabetes mellitus without complication, with long-term current use of insulin [facility])   [facility] [facility] [place], Jeannett Senior L, RPH-CPP   9 months ago Type 2 diabetes mellitus without complication, with long-term current use of insulin [facility])   [facility] [place], Cornelius Moras, RPH-CPP

## 2021-12-08 ENCOUNTER — Other Ambulatory Visit: Payer: Self-pay | Admitting: Family Medicine

## 2021-12-19 ENCOUNTER — Encounter (HOSPITAL_COMMUNITY): Payer: Self-pay

## 2021-12-19 ENCOUNTER — Telehealth (HOSPITAL_COMMUNITY): Payer: Self-pay

## 2021-12-19 ENCOUNTER — Ambulatory Visit (HOSPITAL_COMMUNITY)
Admission: EM | Admit: 2021-12-19 | Discharge: 2021-12-19 | Disposition: A | Discharge status: Home / Self Care | Payer: Medicaid Other | Attending: Internal Medicine | Admitting: Internal Medicine

## 2021-12-19 DIAGNOSIS — N760 Acute vaginitis: Secondary | ICD-10-CM | POA: Diagnosis not present

## 2021-12-19 LAB — POCT URINALYSIS DIPSTICK, ED / UC
Bilirubin Urine: NEGATIVE
Glucose, UA: NEGATIVE mg/dL
Ketones, ur: NEGATIVE mg/dL
Nitrite: NEGATIVE
Protein, ur: 30 mg/dL — AB
Specific Gravity, Urine: 1.025 (ref 1.005–1.030)
Urobilinogen, UA: 0.2 mg/dL (ref 0.0–1.0)
pH: 5.5 (ref 5.0–8.0)

## 2021-12-19 MED ORDER — FLUCONAZOLE 150 MG PO TABS: 150.0000 mg | ORAL_TABLET | ORAL | 0 refills | Status: DC

## 2021-12-19 MED ORDER — LIDOCAINE HCL 2 % EX GEL: 1.0000 | CUTANEOUS | 0 refills | Status: DC | PRN

## 2021-12-19 MED ORDER — METRONIDAZOLE 500 MG PO TABS: 500.0000 mg | ORAL_TABLET | Freq: Two times a day (BID) | ORAL | 0 refills | Status: DC

## 2021-12-19 MED ORDER — FLUCONAZOLE 150 MG PO TABS: 150.0000 mg | ORAL_TABLET | Freq: Once | ORAL | 0 refills | Status: DC

## 2021-12-19 NOTE — Discharge Instructions (Addendum)
Please avoid douching Increase oral fluid intake. Take medications as prescribed We will call you with recommendations if labs are abnormal Return to urgent care if you have any other concerns.

## 2021-12-19 NOTE — ED Triage Notes (Signed)
Patient having vaginal swelling for a day. Having pain and swelling internally.   No blood, having burning and itching. Patient took a monistat one day but this did not help.   No urinary symptoms, having bilateral flank pain. Having lower abdominal pressure.

## 2021-12-20 LAB — CERVICOVAGINAL ANCILLARY ONLY
Bacterial Vaginitis (gardnerella): NEGATIVE
Candida Glabrata: NEGATIVE
Candida Vaginitis: POSITIVE — AB
Chlamydia: NEGATIVE
Comment: NEGATIVE
Comment: NEGATIVE
Comment: NEGATIVE
Comment: NEGATIVE
Comment: NEGATIVE
Comment: NORMAL
Neisseria Gonorrhea: NEGATIVE
Trichomonas: NEGATIVE

## 2021-12-22 NOTE — ED Provider Notes (Signed)
[name]    CSN: [id number] Arrival date & time: 12/19/21  1802      History   Chief Complaint Chief Complaint  Patient presents with   Groin Swelling   Vaginal Itching   Flank Pain    HPI [name] is a 51 y.o. female comes to the urgent care with vaginal pain of 1 day duration.  Patient says onset was fairly sudden and has been persistent.  She has scant vaginal discharge and some vaginal odor.  Patient denies any groin pain or swelling.  She has vaginal itching and flank pain but no dysuria urgency or frequency.  Patient is sexually active and engages in unprotected sexual intercourse.  Patient douched last week.  She used white vinegar and douching.  No fever or chills.   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   Hot flashes 10/02/2018   Vaginal dryness 10/02/2018   BIH (benign intracranial hypertension) 11/06/2017   Somnolence, daytime 07/30/2017   Snoring 07/30/2017   Morbid obesity ([place]) 07/30/2017   Migraine 10/02/2016   Common migraine with intractable migraine 10/02/2016   Type 2 diabetes mellitus without complication, without long-term current use of insulin ([place]) 08/17/2016    Past Surgical History:  Procedure Laterality Date   CHOLECYSTECTOMY     TUBAL LIGATION      OB History     Gravida  5   Para  3   Term  3   Preterm      AB  2   Living  3      SAB  1   IAB  1   Ectopic      Multiple      Live Births               Home Medications    Prior to Admission medications   Medication Sig Start Date End Date Taking? Authorizing Provider  Accu-Chek Softclix Lancets lancets Use as instructed 12/29/19    [name] Perna, NP  albuterol (VENTOLIN HFA) 108 (90 Base) MCG/ACT inhaler Inhale 1-2 puffs into the lungs every 6 (six) hours as needed for wheezing or shortness of breath. 11/05/20   [name] Closs, PA-C  albuterol (VENTOLIN HFA) 108 (90 Base) MCG/ACT inhaler Inhale 1-2 puffs into the lungs every 6 (six) hours as needed for up to 14 days for wheezing or shortness of breath. 06/07/21 06/21/21  [name], [name] Lea, NP  amLODipine (NORVASC) 10 MG tablet Take 1 tablet (10 mg total) by mouth daily. 01/07/21   [name] Perna, NP  aspirin EC 81 MG tablet Take 81 mg by mouth every 6 (six) hours as needed for mild pain.     [provider]  atorvastatin (LIPITOR) 40 MG tablet Take 1 tablet (40 mg total) by mouth daily. 02/15/21   Charlott Rakes, MD  blood glucose meter kit and supplies Dispense based on patient and insurance preference. Use up to four times daily as directed. (FOR ICD-10 E10.9, E11.9). 01/05/20   [name] Perna, NP  Dulaglutide (TRULICITY) 6.97 XY/8.0XK SOPN INJECT 0.75 MG INTO THE SKIN ONCE A WEEK. 12/08/21   Charlott Rakes, MD  DULoxetine (  CYMBALTA) 60 MG capsule TAKE 1 CAPSULE (60 MG TOTAL) BY MOUTH 2 (TWO) TIMES DAILY (AM+BEDTIME) 10/26/21   [name] Slaughter, NP  EASY COMFORT PEN NEEDLES 31G X 8 MM MISC USE TO INJECT LEVEMIR TWICE A DAY 10/26/21   Charlott Rakes, MD  fluconazole (DIFLUCAN) 150 MG tablet Take 1 tablet (150 mg total) by mouth every 3 (three) days. 12/19/21   [name] Picket, MD  fluticasone (FLONASE) 50 MCG/ACT nasal spray Place 2 sprays into both nostrils daily. 11/05/20   [name] Closs, PA-C  fluticasone (FLONASE) 50 MCG/ACT nasal spray Place 2 sprays into both nostrils daily for 14 days. 06/07/21 06/21/21  [name], [name] Lea, NP  fluticasone (FLOVENT HFA) 44 MCG/ACT inhaler Inhale 2 puffs into the lungs 2 (two) times daily. 01/19/19   [name] Perna, NP  gabapentin (NEURONTIN) 300 MG capsule TAKE ONE CAPSULE BY MOUTH THREE  TIMES DAILY ( AM+NOON+BEDTIME) 11/15/21   [name] Perna, NP  glucose blood (ACCU-CHEK GUIDE) test strip 1 each by Other route 3 (three) times daily after meals. Use as instructed 10/26/20   [name] Perna, NP  HYDROcodone-acetaminophen (NORCO) 5-325 MG tablet Take 1 tablet by mouth every 4 (four) hours as needed for moderate pain. 6/76/19   Delora Fuel, MD  ibuprofen (ADVIL) 800 MG tablet TAKE 1 TABLET (800 MG TOTAL) BY MOUTH EVERY 8 (EIGHT) HOURS AS NEEDED. 06/06/21   [name] Perna, NP  insulin glargine (LANTUS SOLOSTAR) 100 UNIT/ML Solostar Pen Inject 70 Units into the skin daily. 09/16/21   Charlott Rakes, MD  Lancets (ACCU-CHEK SOFT TOUCH) lancets Use as instructed 12/29/19   [name] Perna, NP  lidocaine (XYLOCAINE) 2 % jelly Place 1 Application into the urethra as needed. 12/19/21   [name] Picket, MD  losartan (COZAAR) 50 MG tablet TAKE 1 TABLET (50 MG TOTAL) BY MOUTH DAILY (AM) 08/04/21   [name] Perna, NP  metFORMIN (GLUCOPHAGE) 1000 MG tablet TAKE ONE TABLET BY MOUTH TWICE DAILY (AM+BEDTIME) 03/01/21   [name] Perna, NP  metroNIDAZOLE (FLAGYL) 500 MG tablet Take 1 tablet (500 mg total) by mouth 2 (two) times daily. 12/19/21   [name], Myrene Galas, MD  ondansetron (ZOFRAN) 4 MG tablet Take 1 tablet (4 mg total) by mouth every 8 (eight) hours as needed for nausea or vomiting. 02/15/21   Charlott Rakes, MD  ondansetron (ZOFRAN-ODT) 4 MG disintegrating tablet 21m ODT q4 hours prn nausea/vomit 08/10/21   FDeno Etienne DO  pantoprazole (PROTONIX) 40 MG tablet TAKE 1 TABLET (40 MG TOTAL) BY MOUTH DAILY (AM) 08/04/21   [name] Perna NP  traZODone (DESYREL) 100 MG tablet Take 1 tablet (100 mg total) by mouth at bedtime. 10/26/21   PSalley Slaughter NP    Family History Family History  Problem Relation Age of Onset   Diabetes Mother    Hypertension Mother    Renal Disease Mother    Cancer Father    Diabetes Sister    Diabetes Brother    Renal Disease Brother     Diabetes Brother    Diabetes Sister    Diabetes Brother    Diabetes Brother    Diabetes Brother    Healthy Daughter    Healthy Daughter     Social History Social History   Tobacco Use   Smoking status: Every Day    Packs/day: 0.50    Types: Cigarettes   Smokeless tobacco: Never  Vaping Use   Vaping Use: Former  Substance Use Topics  Alcohol use: No   Drug use: No     Allergies   Lisinopril and Trulicity [dulaglutide]   Review of Systems Review of Systems  Respiratory: Negative.    Gastrointestinal: Negative.   Genitourinary:  Positive for flank pain, vaginal discharge and vaginal pain. Negative for dysuria, genital sores, hematuria, urgency and vaginal bleeding.  Skin: Negative.   Neurological: Negative.      Physical Exam Triage Vital Signs ED Triage Vitals  Enc Vitals Group     BP 12/19/21 1848 (!) 141/92     Pulse Rate 12/19/21 1848 90     Resp 12/19/21 1848 16     Temp 12/19/21 1848 98.3 F (36.8 C)     Temp Source 12/19/21 1848 Oral     SpO2 12/19/21 1848 96 %     Weight --      Height --      Head Circumference --      Peak Flow --      Pain Score 12/19/21 1852 7     Pain Loc --      Pain Edu? --      Excl. in [place]? --    No data found.  Updated Vital Signs BP (!) 141/92 (BP Location: Right Arm)   Pulse 90   Temp 98.3 F (36.8 C) (Oral)   Resp 16   LMP  (LMP Unknown)   SpO2 96%   Visual Acuity Right Eye Distance:   Left Eye Distance:   Bilateral Distance:    Right Eye Near:   Left Eye Near:    Bilateral Near:     Physical Exam Vitals and nursing note reviewed. Exam conducted with a chaperone present.  Constitutional:      General: She is not in acute distress.    Appearance: She is not ill-appearing.  Cardiovascular:     Rate and Rhythm: Normal rate and regular rhythm.     Pulses: Normal pulses.     Heart sounds: Normal heart sounds.  Pulmonary:     Effort: Pulmonary effort is normal.     Breath sounds: Normal breath  sounds.  Abdominal:     General: Bowel sounds are normal.     Palpations: Abdomen is soft.  Genitourinary:    General: Normal vulva.     Comments: Mild erythema of the vaginal walls.  Scant vaginal discharge.  Vaginal order-fishy smell.  No rash noted. Neurological:     Mental Status: She is alert.      UC Treatments / Results  Labs (all labs ordered are listed, but only abnormal results are displayed) Labs Reviewed  POCT URINALYSIS DIPSTICK, ED / UC - Abnormal; Notable for the following components:      Result Value   Hgb urine dipstick SMALL (*)    Protein, ur 30 (*)    Leukocytes,Ua LARGE (*)    All other components within normal limits  CERVICOVAGINAL ANCILLARY ONLY - Abnormal; Notable for the following components:   Candida Vaginitis Positive (*)    All other components within normal limits    EKG   Radiology No results found.  Procedures Procedures (including critical care time)  Medications Ordered in UC Medications - No data to display  Initial Impression / Assessment and Plan / UC Course  I have reviewed the triage vital signs and the nursing notes.  Pertinent labs & imaging results that were available during my care of the patient were reviewed by me and considered in my medical decision  making (see chart for details).     1.  Acute vaginitis: Cervicovaginal swab for GC/chlamydia/trichomonas/bacterial vaginosis/vaginal yeast. Fluconazole for vaginal yeast Metronidazole 500 mg twice daily for 7 days We will call patient with recommendations if labs are abnormal Lidocaine gel as needed for vaginal pain Return to urgent care if you have any other concerns. Final Clinical Impressions(s) / UC Diagnoses   Final diagnoses:  Acute vaginitis     Discharge Instructions      Please avoid douching Increase oral fluid intake. Take medications as prescribed We will call you with recommendations if labs are abnormal Return to urgent care if you have any  other concerns.   ED Prescriptions     Medication Sig Dispense Auth. Provider   fluconazole (DIFLUCAN) 150 MG tablet Take 1 tablet (150 mg total) by mouth once for 1 dose. 2 tablet Kruti Horacek, Myrene Galas, MD   metroNIDAZOLE (FLAGYL) 500 MG tablet Take 1 tablet (500 mg total) by mouth 2 (two) times daily. 14 tablet Roxie Kreeger, Myrene Galas, MD   lidocaine (XYLOCAINE) 2 % jelly Place 1 Application into the urethra as needed. 30 mL Shemekia Patane, Myrene Galas, MD      PDMP not reviewed this encounter.   [name] Picket, MD 12/22/21 [phone]

## 2021-12-28 ENCOUNTER — Other Ambulatory Visit (INDEPENDENT_AMBULATORY_CARE_PROVIDER_SITE_OTHER): Payer: Self-pay | Admitting: Primary Care

## 2021-12-28 ENCOUNTER — Other Ambulatory Visit (HOSPITAL_COMMUNITY): Payer: Self-pay | Admitting: Psychiatry

## 2021-12-28 DIAGNOSIS — I10 Essential (primary) hypertension: Secondary | ICD-10-CM

## 2021-12-28 DIAGNOSIS — F3341 Major depressive disorder, recurrent, in partial remission: Secondary | ICD-10-CM

## 2021-12-28 DIAGNOSIS — E785 Hyperlipidemia, unspecified: Secondary | ICD-10-CM

## 2021-12-28 DIAGNOSIS — E119 Type 2 diabetes mellitus without complications: Secondary | ICD-10-CM

## 2021-12-29 ENCOUNTER — Other Ambulatory Visit (INDEPENDENT_AMBULATORY_CARE_PROVIDER_SITE_OTHER): Payer: Self-pay

## 2021-12-29 DIAGNOSIS — E119 Type 2 diabetes mellitus without complications: Secondary | ICD-10-CM

## 2021-12-29 MED ORDER — METFORMIN HCL 1000 MG PO TABS: ORAL_TABLET | ORAL | 0 refills | Status: DC

## 2021-12-29 NOTE — Telephone Encounter (Signed)
Requested medication (s) are due for refill today: yes  Requested medication (s) are on the active medication list: yes    Last refill: 03/01/21  #180  0 refills  Future visit scheduled no  Notes to clinic: Failed due to labs, please review. Thank you.  Requested Prescriptions  Pending Prescriptions Disp Refills   metFORMIN (GLUCOPHAGE) 1000 MG tablet [Pharmacy Med Name: METFORMIN HCL 1000 MG ORAL TABLET] 180 tablet 0    Sig: TAKE ONE TABLET BY MOUTH TWICE DAILY (AM+BEDTIME)     Endocrinology:  Diabetes - Biguanides Failed - 12/28/2021  1:04 PM      Failed - HBA1C is between 0 and 7.9 and within 180 days    HbA1c, POC (controlled diabetic range)  Date Value Ref Range Status  09/16/2021 8.5 (A) 0.0 - 7.0 % Final         Failed - B12 Level in normal range and within 720 days    Vitamin B-12  Date Value Ref Range Status  08/01/2018 301 232 - 1,245 pg/mL Final         Passed - Cr in normal range and within 360 days    Creatinine, Ser  Date Value Ref Range Status  08/10/2021 0.87 0.44 - 1.00 mg/dL Final         Passed - eGFR in normal range and within 360 days    GFR calc Af Amer  Date Value Ref Range Status  12/07/2019 >60 >60 mL/min Final   GFR, Estimated  Date Value Ref Range Status  08/10/2021 >60 >60 mL/min Final    Comment:    (NOTE) Calculated using the CKD-EPI Creatinine Equation (2021)    eGFR  Date Value Ref Range Status  01/07/2021 85 >59 mL/min/1.73 Final         Passed - Valid encounter within last 6 months    Recent Outpatient Visits           3 months ago Vaginal irritation   [place], Michelle P, NP   3 months ago Type 2 diabetes mellitus without complication, with long-term current use of insulin ([place])   [place], Annie Main L, RPH-CPP   7 months ago Type 2 diabetes mellitus without complication, with long-term current use of insulin ([place])   [place], Annie Main L, RPH-CPP   9 months ago Type 2 diabetes mellitus without complication, with long-term current use of insulin ([place])   [place], Annie Main L, RPH-CPP   10 months ago Type 2 diabetes mellitus without complication, with long-term current use of insulin [facility])   [place] Ausdall, Annie Main L, RPH-CPP              Passed - CBC within normal limits and completed in the last 12 months    WBC  Date Value Ref Range Status  08/10/2021 13.1 (H) 4.0 - 10.5 K/uL Final   RBC  Date Value Ref Range Status  08/10/2021 5.78 (H) 3.87 - 5.11 MIL/uL Final   Hemoglobin  Date Value Ref Range Status  08/10/2021 14.6 12.0 - 15.0 g/dL Final  01/07/2021 15.2 11.1 - 15.9 g/dL Final   HCT  Date Value Ref Range Status  08/10/2021 43.3 36.0 - 46.0 % Final   Hematocrit  Date Value Ref Range Status  01/07/2021 46.1 34.0 - 46.6 % Final   MCHC  Date Value Ref Range Status  08/10/2021 33.7 30.0 - 36.0 g/dL Final   [facility]  Date Value Ref Range Status  08/10/2021 25.3 (L) 26.0 - 34.0 pg Final   MCV  Date Value Ref Range Status  08/10/2021 74.9 (L) 80.0 - 100.0 fL Final  01/07/2021 76 (L) 79 - 97 fL Final   No results found for: "PLTCOUNTKUC", "LABPLAT", "POCPLA" RDW  Date Value Ref Range Status  08/10/2021 14.6 11.5 - 15.5 % Final  01/07/2021 14.2 11.7 - 15.4 % Final         Signed Prescriptions Disp Refills   amLODipine (NORVASC) 10 MG tablet 90 tablet 0    Sig: TAKE 1 TABLET (10 MG TOTAL) BY MOUTH DAILY. (AM)     Cardiovascular: Calcium Channel Blockers 2 Failed - 12/28/2021  1:04 PM      Failed - Last BP in normal range    BP Readings from Last 1 Encounters:  12/19/21 (!) 141/92         Passed - Last Heart Rate in normal range    Pulse Readings from Last 1 Encounters:  12/19/21 90         Passed - Valid encounter within last 6 months    Recent Outpatient Visits            3 months ago Vaginal irritation   [place] Kerin Perna, NP   3 months ago Type 2 diabetes mellitus without complication, with long-term current use of insulin ([place])   [place], Annie Main L, RPH-CPP   7 months ago Type 2 diabetes mellitus without complication, with long-term current use of insulin [facility])   [place], Annie Main L, RPH-CPP   9 months ago Type 2 diabetes mellitus without complication, with long-term current use of insulin [facility])   [place], Annie Main L, RPH-CPP   10 months ago Type 2 diabetes mellitus without complication, with long-term current use of insulin ([place])   [place] Ausdall, Jarome Matin, RPH-CPP              Refused Prescriptions Disp Refills   celecoxib (CELEBREX) 200 MG capsule [Pharmacy Med Name: CELECOXIB 200 MG ORAL CAPSULE] 180 capsule 1    Sig: TAKE ONE CAPSULE BY MOUTH TWICE DAILY (AM+BEDTIME)     Analgesics:  COX2 Inhibitors Failed - 12/28/2021  1:04 PM      Failed - Manual Review: Labs are only required if the patient has taken medication for more than 8 weeks.      Passed - HGB in normal range and within 360 days    Hemoglobin  Date Value Ref Range Status  08/10/2021 14.6 12.0 - 15.0 g/dL Final  01/07/2021 15.2 11.1 - 15.9 g/dL Final         Passed - Cr in normal range and within 360 days    Creatinine, Ser  Date Value Ref Range Status  08/10/2021 0.87 0.44 - 1.00 mg/dL Final         Passed - HCT in normal range and within 360 days    HCT  Date Value Ref Range Status  08/10/2021 43.3 36.0 - 46.0 % Final   Hematocrit  Date Value Ref Range Status  01/07/2021 46.1 34.0 - 46.6 % Final         Passed - AST in normal range and within 360  days    AST  Date Value Ref Range Status  08/10/2021 26 15 - 41 U/L Final         Passed - ALT in normal  range and within 360 days    ALT  Date Value Ref Range Status  08/10/2021 32 0 - 44 U/L Final         Passed - eGFR is 30 or above and within 360 days    GFR calc Af Amer  Date Value Ref Range Status  12/07/2019 >60 >60 mL/min Final   GFR, Estimated  Date Value Ref Range Status  08/10/2021 >60 >60 mL/min Final    Comment:    (NOTE) Calculated using the CKD-EPI Creatinine Equation (2021)    eGFR  Date Value Ref Range Status  01/07/2021 85 >59 mL/min/1.73 Final         Passed - Patient is not pregnant      Passed - Valid encounter within last 12 months    Recent Outpatient Visits           3 months ago Vaginal irritation   [place] Kerin Perna, NP   3 months ago Type 2 diabetes mellitus without complication, with long-term current use of insulin ([place])   [place], Annie Main L, RPH-CPP   7 months ago Type 2 diabetes mellitus without complication, with long-term current use of insulin ([place])   [place], Annie Main L, RPH-CPP   9 months ago Type 2 diabetes mellitus without complication, with long-term current use of insulin ([place])   [place], Annie Main L, RPH-CPP   10 months ago Type 2 diabetes mellitus without complication, with long-term current use of insulin ([place])   [place], Stephen L, RPH-CPP               pravastatin (PRAVACHOL) 40 MG tablet [Pharmacy Med Name: PRAVASTATIN SODIUM 40 MG ORAL TABLET] 90 tablet 1    Sig: TAKE 1 TABLET (40 MG TOTAL) BY MOUTH DAILY (BEDTIME)     Cardiovascular:  Antilipid - Statins Failed - 12/28/2021  1:04 PM      Failed - Lipid Panel in normal range within the last 12 months    Cholesterol, Total  Date Value Ref Range Status  01/07/2021 222 (H) 100 - 199 mg/dL Final   LDL Chol Calc (NIH)  Date Value Ref Range Status   01/07/2021 158 (H) 0 - 99 mg/dL Final   HDL  Date Value Ref Range Status  01/07/2021 42 >39 mg/dL Final   Triglycerides  Date Value Ref Range Status  01/07/2021 121 0 - 149 mg/dL Final         Passed - Patient is not pregnant      Passed - Valid encounter within last 12 months    Recent Outpatient Visits           3 months ago Vaginal irritation   [place], Michelle P, NP   3 months ago Type 2 diabetes mellitus without complication, with long-term current use of insulin [facility])   [place], Annie Main L, RPH-CPP   7 months ago Type 2 diabetes mellitus without complication, with long-term current use of insulin [facility])   [place] Ausdall, Annie Main L, RPH-CPP   9 months  ago Type 2 diabetes mellitus without complication, with long-term current use of insulin [facility])   [place], Annie Main L, RPH-CPP   10 months ago Type 2 diabetes mellitus without complication, with long-term current use of insulin [facility])   [place], RPH-CPP

## 2021-12-29 NOTE — Telephone Encounter (Signed)
Requested Prescriptions  Pending Prescriptions Disp Refills  . celecoxib (CELEBREX) 200 MG capsule [Pharmacy Med Name: CELECOXIB 200 MG ORAL CAPSULE] 180 capsule 1    Sig: TAKE ONE CAPSULE BY MOUTH TWICE DAILY (AM+BEDTIME)     Analgesics:  COX2 Inhibitors Failed - 12/28/2021  1:04 PM      Failed - Manual Review: Labs are only required if the patient has taken medication for more than 8 weeks.      Passed - HGB in normal range and within 360 days    Hemoglobin  Date Value Ref Range Status  08/10/2021 14.6 12.0 - 15.0 g/dL Final  01/07/2021 15.2 11.1 - 15.9 g/dL Final         Passed - Cr in normal range and within 360 days    Creatinine, Ser  Date Value Ref Range Status  08/10/2021 0.87 0.44 - 1.00 mg/dL Final         Passed - HCT in normal range and within 360 days    HCT  Date Value Ref Range Status  08/10/2021 43.3 36.0 - 46.0 % Final   Hematocrit  Date Value Ref Range Status  01/07/2021 46.1 34.0 - 46.6 % Final         Passed - AST in normal range and within 360 days    AST  Date Value Ref Range Status  08/10/2021 26 15 - 41 U/L Final         Passed - ALT in normal range and within 360 days    ALT  Date Value Ref Range Status  08/10/2021 32 0 - 44 U/L Final         Passed - eGFR is 30 or above and within 360 days    GFR calc Af Amer  Date Value Ref Range Status  12/07/2019 >60 >60 mL/min Final   GFR, Estimated  Date Value Ref Range Status  08/10/2021 >60 >60 mL/min Final    Comment:    (NOTE) Calculated using the CKD-EPI Creatinine Equation (2021)    eGFR  Date Value Ref Range Status  01/07/2021 85 >59 mL/min/1.73 Final         Passed - Patient is not pregnant      Passed - Valid encounter within last 12 months    Recent Outpatient Visits          3 months ago Vaginal irritation   [place], Michelle P, NP   3 months ago Type 2 diabetes mellitus without complication, with long-term current use of insulin ([place])    [place], Annie Main L, RPH-CPP   7 months ago Type 2 diabetes mellitus without complication, with long-term current use of insulin [facility])   [place], Annie Main L, RPH-CPP   9 months ago Type 2 diabetes mellitus without complication, with long-term current use of insulin [facility])   [place], Annie Main L, RPH-CPP   10 months ago Type 2 diabetes mellitus without complication, with long-term current use of insulin [facility])   [place], RPH-CPP             . pravastatin (PRAVACHOL) 40 MG tablet [Pharmacy Med Name: PRAVASTATIN SODIUM 40 MG ORAL TABLET] 90 tablet 1    Sig: TAKE 1 TABLET (40 MG TOTAL) BY MOUTH DAILY (BEDTIME)     Cardiovascular:  Antilipid - Statins Failed -  12/28/2021  1:04 PM      Failed - Lipid Panel in normal range within the last 12 months    Cholesterol, Total  Date Value Ref Range Status  01/07/2021 222 (H) 100 - 199 mg/dL Final   LDL Chol Calc (NIH)  Date Value Ref Range Status  01/07/2021 158 (H) 0 - 99 mg/dL Final   HDL  Date Value Ref Range Status  01/07/2021 42 >39 mg/dL Final   Triglycerides  Date Value Ref Range Status  01/07/2021 121 0 - 149 mg/dL Final         Passed - Patient is not pregnant      Passed - Valid encounter within last 12 months    Recent Outpatient Visits          3 months ago Vaginal irritation   [place], Michelle P, NP   3 months ago Type 2 diabetes mellitus without complication, with long-term current use of insulin ([place])   [place], Annie Main L, RPH-CPP   7 months ago Type 2 diabetes mellitus without complication, with long-term current use of insulin [facility])   [place], Annie Main L, RPH-CPP   9 months ago Type 2 diabetes mellitus  without complication, with long-term current use of insulin [facility])   [place], Annie Main L, RPH-CPP   10 months ago Type 2 diabetes mellitus without complication, with long-term current use of insulin [facility])   [place], RPH-CPP             . metFORMIN (GLUCOPHAGE) 1000 MG tablet [Pharmacy Med Name: METFORMIN HCL 1000 MG ORAL TABLET] 180 tablet 0    Sig: TAKE ONE TABLET BY MOUTH TWICE DAILY (AM+BEDTIME)     Endocrinology:  Diabetes - Biguanides Failed - 12/28/2021  1:04 PM      Failed - HBA1C is between 0 and 7.9 and within 180 days    HbA1c, POC (controlled diabetic range)  Date Value Ref Range Status  09/16/2021 8.5 (A) 0.0 - 7.0 % Final         Failed - B12 Level in normal range and within 720 days    Vitamin B-12  Date Value Ref Range Status  08/01/2018 301 232 - 1,245 pg/mL Final         Passed - Cr in normal range and within 360 days    Creatinine, Ser  Date Value Ref Range Status  08/10/2021 0.87 0.44 - 1.00 mg/dL Final         Passed - eGFR in normal range and within 360 days    GFR calc Af Amer  Date Value Ref Range Status  12/07/2019 >60 >60 mL/min Final   GFR, Estimated  Date Value Ref Range Status  08/10/2021 >60 >60 mL/min Final    Comment:    (NOTE) Calculated using the CKD-EPI Creatinine Equation (2021)    eGFR  Date Value Ref Range Status  01/07/2021 85 >59 mL/min/1.73 Final         Passed - Valid encounter within last 6 months    Recent Outpatient Visits          3 months ago Vaginal irritation   [place], Michelle P, NP   3 months ago Type 2 diabetes mellitus without complication, with long-term current use of insulin ([place])  [place], Jarome Matin, RPH-CPP   7 months ago Type 2 diabetes mellitus without complication, with long-term current use of insulin [facility])   [place], Jarome Matin, RPH-CPP   9 months ago Type 2 diabetes mellitus without complication, with long-term current use of insulin ([place])   [place], Annie Main L, RPH-CPP   10 months ago Type 2 diabetes mellitus without complication, with long-term current use of insulin ([place])   [place], Jarome Matin, RPH-CPP             Passed - CBC within normal limits and completed in the last 12 months    WBC  Date Value Ref Range Status  08/10/2021 13.1 (H) 4.0 - 10.5 K/uL Final   RBC  Date Value Ref Range Status  08/10/2021 5.78 (H) 3.87 - 5.11 MIL/uL Final   Hemoglobin  Date Value Ref Range Status  08/10/2021 14.6 12.0 - 15.0 g/dL Final  01/07/2021 15.2 11.1 - 15.9 g/dL Final   HCT  Date Value Ref Range Status  08/10/2021 43.3 36.0 - 46.0 % Final   Hematocrit  Date Value Ref Range Status  01/07/2021 46.1 34.0 - 46.6 % Final   MCHC  Date Value Ref Range Status  08/10/2021 33.7 30.0 - 36.0 g/dL Final   [facility]  Date Value Ref Range Status  08/10/2021 25.3 (L) 26.0 - 34.0 pg Final   MCV  Date Value Ref Range Status  08/10/2021 74.9 (L) 80.0 - 100.0 fL Final  01/07/2021 76 (L) 79 - 97 fL Final   No results found for: "PLTCOUNTKUC", "LABPLAT", "POCPLA" RDW  Date Value Ref Range Status  08/10/2021 14.6 11.5 - 15.5 % Final  01/07/2021 14.2 11.7 - 15.4 % Final         Signed Prescriptions Disp Refills   amLODipine (NORVASC) 10 MG tablet 90 tablet 0    Sig: TAKE 1 TABLET (10 MG TOTAL) BY MOUTH DAILY. (AM)     Cardiovascular: Calcium Channel Blockers 2 Failed - 12/28/2021  1:04 PM      Failed - Last BP in normal range    BP Readings from Last 1 Encounters:  12/19/21 (!) 141/92         Passed - Last Heart Rate in normal range    Pulse Readings from Last 1 Encounters:  12/19/21 90         Passed - Valid encounter within last 6 months    Recent  Outpatient Visits          3 months ago Vaginal irritation   [place] Kerin Perna, NP   3 months ago Type 2 diabetes mellitus without complication, with long-term current use of insulin ([place])   [place], Annie Main L, RPH-CPP   7 months ago Type 2 diabetes mellitus without complication, with long-term current use of insulin [facility])   [place], Annie Main L, RPH-CPP   9 months ago Type 2 diabetes mellitus without complication, with long-term current use of insulin [facility])   [place], Annie Main L, RPH-CPP   10 months ago Type 2 diabetes mellitus without complication, with long-term current use of insulin [facility])   [place], RPH-CPP

## 2021-12-29 NOTE — Telephone Encounter (Signed)
Requested Prescriptions  Pending Prescriptions Disp Refills  . celecoxib (CELEBREX) 200 MG capsule [Pharmacy Med Name: CELECOXIB 200 MG ORAL CAPSULE] 180 capsule 1    Sig: TAKE ONE CAPSULE BY MOUTH TWICE DAILY (AM+BEDTIME)     Analgesics:  COX2 Inhibitors Failed - 12/28/2021  1:04 PM      Failed - Manual Review: Labs are only required if the patient has taken medication for more than 8 weeks.      Passed - HGB in normal range and within 360 days    Hemoglobin  Date Value Ref Range Status  08/10/2021 14.6 12.0 - 15.0 g/dL Final  01/07/2021 15.2 11.1 - 15.9 g/dL Final         Passed - Cr in normal range and within 360 days    Creatinine, Ser  Date Value Ref Range Status  08/10/2021 0.87 0.44 - 1.00 mg/dL Final         Passed - HCT in normal range and within 360 days    HCT  Date Value Ref Range Status  08/10/2021 43.3 36.0 - 46.0 % Final   Hematocrit  Date Value Ref Range Status  01/07/2021 46.1 34.0 - 46.6 % Final         Passed - AST in normal range and within 360 days    AST  Date Value Ref Range Status  08/10/2021 26 15 - 41 U/L Final         Passed - ALT in normal range and within 360 days    ALT  Date Value Ref Range Status  08/10/2021 32 0 - 44 U/L Final         Passed - eGFR is 30 or above and within 360 days    GFR calc Af Amer  Date Value Ref Range Status  12/07/2019 >60 >60 mL/min Final   GFR, Estimated  Date Value Ref Range Status  08/10/2021 >60 >60 mL/min Final    Comment:    (NOTE) Calculated using the CKD-EPI Creatinine Equation (2021)    eGFR  Date Value Ref Range Status  01/07/2021 85 >59 mL/min/1.73 Final         Passed - Patient is not pregnant      Passed - Valid encounter within last 12 months    Recent Outpatient Visits          3 months ago Vaginal irritation   [place], Michelle P, NP   3 months ago Type 2 diabetes mellitus without complication, with long-term current use of insulin ([place])    [place], Annie Main L, RPH-CPP   7 months ago Type 2 diabetes mellitus without complication, with long-term current use of insulin [facility])   [place], Annie Main L, RPH-CPP   9 months ago Type 2 diabetes mellitus without complication, with long-term current use of insulin [facility])   [place], Annie Main L, RPH-CPP   10 months ago Type 2 diabetes mellitus without complication, with long-term current use of insulin [facility])   [place], RPH-CPP             . pravastatin (PRAVACHOL) 40 MG tablet [Pharmacy Med Name: PRAVASTATIN SODIUM 40 MG ORAL TABLET] 90 tablet 1    Sig: TAKE 1 TABLET (40 MG TOTAL) BY MOUTH DAILY (BEDTIME)     Cardiovascular:  Antilipid - Statins Failed -  12/28/2021  1:04 PM      Failed - Lipid Panel in normal range within the last 12 months    Cholesterol, Total  Date Value Ref Range Status  01/07/2021 222 (H) 100 - 199 mg/dL Final   LDL Chol Calc (NIH)  Date Value Ref Range Status  01/07/2021 158 (H) 0 - 99 mg/dL Final   HDL  Date Value Ref Range Status  01/07/2021 42 >39 mg/dL Final   Triglycerides  Date Value Ref Range Status  01/07/2021 121 0 - 149 mg/dL Final         Passed - Patient is not pregnant      Passed - Valid encounter within last 12 months    Recent Outpatient Visits          3 months ago Vaginal irritation   [place], Michelle P, NP   3 months ago Type 2 diabetes mellitus without complication, with long-term current use of insulin ([place])   [place], Annie Main L, RPH-CPP   7 months ago Type 2 diabetes mellitus without complication, with long-term current use of insulin [facility])   [place], Annie Main L, RPH-CPP   9 months ago Type 2 diabetes mellitus  without complication, with long-term current use of insulin [facility])   [place], Annie Main L, RPH-CPP   10 months ago Type 2 diabetes mellitus without complication, with long-term current use of insulin [facility])   [place], RPH-CPP             . amLODipine (NORVASC) 10 MG tablet [Pharmacy Med Name: AMLODIPINE BESYLATE 10 MG ORAL TABLET] 90 tablet 0    Sig: TAKE 1 TABLET (10 MG TOTAL) BY MOUTH DAILY. (AM)     Cardiovascular: Calcium Channel Blockers 2 Failed - 12/28/2021  1:04 PM      Failed - Last BP in normal range    BP Readings from Last 1 Encounters:  12/19/21 (!) 141/92         Passed - Last Heart Rate in normal range    Pulse Readings from Last 1 Encounters:  12/19/21 90         Passed - Valid encounter within last 6 months    Recent Outpatient Visits          3 months ago Vaginal irritation   [place] Kerin Perna, NP   3 months ago Type 2 diabetes mellitus without complication, with long-term current use of insulin ([place])   [place], Annie Main L, RPH-CPP   7 months ago Type 2 diabetes mellitus without complication, with long-term current use of insulin [facility])   [place], Annie Main L, RPH-CPP   9 months ago Type 2 diabetes mellitus without complication, with long-term current use of insulin [facility])   [place], Annie Main L, RPH-CPP   10 months ago Type 2 diabetes mellitus without complication, with long-term current use of insulin [facility])   [place], RPH-CPP             . metFORMIN (GLUCOPHAGE) 1000 MG tablet [Pharmacy Med Name: METFORMIN HCL 1000 MG ORAL TABLET] 180 tablet 0    Sig: TAKE ONE TABLET BY MOUTH TWICE  DAILY (AM+BEDTIME)     Endocrinology:  Diabetes -  Biguanides Failed - 12/28/2021  1:04 PM      Failed - HBA1C is between 0 and 7.9 and within 180 days    HbA1c, POC (controlled diabetic range)  Date Value Ref Range Status  09/16/2021 8.5 (A) 0.0 - 7.0 % Final         Failed - B12 Level in normal range and within 720 days    Vitamin B-12  Date Value Ref Range Status  08/01/2018 301 232 - 1,245 pg/mL Final         Passed - Cr in normal range and within 360 days    Creatinine, Ser  Date Value Ref Range Status  08/10/2021 0.87 0.44 - 1.00 mg/dL Final         Passed - eGFR in normal range and within 360 days    GFR calc Af Amer  Date Value Ref Range Status  12/07/2019 >60 >60 mL/min Final   GFR, Estimated  Date Value Ref Range Status  08/10/2021 >60 >60 mL/min Final    Comment:    (NOTE) Calculated using the CKD-EPI Creatinine Equation (2021)    eGFR  Date Value Ref Range Status  01/07/2021 85 >59 mL/min/1.73 Final         Passed - Valid encounter within last 6 months    Recent Outpatient Visits          3 months ago Vaginal irritation   [place], Michelle P, NP   3 months ago Type 2 diabetes mellitus without complication, with long-term current use of insulin ([place])   [place], Annie Main L, RPH-CPP   7 months ago Type 2 diabetes mellitus without complication, with long-term current use of insulin ([place])   [place], Annie Main L, RPH-CPP   9 months ago Type 2 diabetes mellitus without complication, with long-term current use of insulin ([place])   [place], Annie Main L, RPH-CPP   10 months ago Type 2 diabetes mellitus without complication, with long-term current use of insulin ([place])   [place], Annie Main L, RPH-CPP             Passed - CBC within normal limits and completed in the last 12 months    WBC  Date Value Ref  Range Status  08/10/2021 13.1 (H) 4.0 - 10.5 K/uL Final   RBC  Date Value Ref Range Status  08/10/2021 5.78 (H) 3.87 - 5.11 MIL/uL Final   Hemoglobin  Date Value Ref Range Status  08/10/2021 14.6 12.0 - 15.0 g/dL Final  01/07/2021 15.2 11.1 - 15.9 g/dL Final   HCT  Date Value Ref Range Status  08/10/2021 43.3 36.0 - 46.0 % Final   Hematocrit  Date Value Ref Range Status  01/07/2021 46.1 34.0 - 46.6 % Final   MCHC  Date Value Ref Range Status  08/10/2021 33.7 30.0 - 36.0 g/dL Final   [facility]  Date Value Ref Range Status  08/10/2021 25.3 (L) 26.0 - 34.0 pg Final   MCV  Date Value Ref Range Status  08/10/2021 74.9 (L) 80.0 - 100.0 fL Final  01/07/2021 76 (L) 79 - 97 fL Final   No results found for: "PLTCOUNTKUC", "LABPLAT", "POCPLA" RDW  Date Value Ref Range Status  08/10/2021 14.6 11.5 - 15.5 % Final  01/07/2021 14.2 11.7 -  15.4 % Final

## 2022-01-21 ENCOUNTER — Other Ambulatory Visit (INDEPENDENT_AMBULATORY_CARE_PROVIDER_SITE_OTHER): Payer: Self-pay | Admitting: Primary Care

## 2022-01-21 DIAGNOSIS — M2550 Pain in unspecified joint: Secondary | ICD-10-CM

## 2022-01-30 ENCOUNTER — Telehealth (HOSPITAL_COMMUNITY): Payer: Medicaid Other | Admitting: Psychiatry

## 2022-02-03 ENCOUNTER — Other Ambulatory Visit (INDEPENDENT_AMBULATORY_CARE_PROVIDER_SITE_OTHER): Payer: Self-pay | Admitting: Primary Care

## 2022-02-03 DIAGNOSIS — E785 Hyperlipidemia, unspecified: Secondary | ICD-10-CM

## 2022-02-03 DIAGNOSIS — K219 Gastro-esophageal reflux disease without esophagitis: Secondary | ICD-10-CM

## 2022-02-05 NOTE — Patient Instructions (Signed)
Thank you for attending your appointment today.  -- START nicotine patch 7 mg daily + gum as needed -- Continue other medications as prescribed.  Please do not make any changes to medications without first discussing with your provider. If you are experiencing a psychiatric emergency, please call 911 or present to your nearest emergency department. Additional crisis, medication management, and therapy resources are included below.  [facility]  [address] [place], [place] [address] [phone] WALK-IN URGENT CARE 24/7 FOR ANYONE [address], [place], [place]  [phone] Fax: [phone] [email or web address] *Interpreters available *Accepts all insurance and uninsured for Urgent Care needs *Accepts Medicaid and uninsured for outpatient treatment (below)      ONLY FOR [facility]  Below:    Outpatient New Patient Assessment/Therapy Walk-ins:        Monday -Thursday 8am until slots are full.        Every Friday 1pm-4pm  (first come, first served)                   New Patient Psychiatry/Medication Management        Monday-Friday 8am-11am (first come, first served)               For all walk-ins we ask that you arrive by 7:15am, because patients will be seen in the order of arrival.

## 2022-02-05 NOTE — Progress Notes (Unsigned)
[name] Outpatient Progress Note  02/06/2022 5:28 PM [name]  MRN:  [id number]  Assessment:  [name] presents for follow-up evaluation. Today, 02/06/22, patient reports continued stability on below medication regimen and denies symptoms of depression or anxiety.  Sleep and appetite are stable.  Denies adverse effects to medications and amenable to continuing as below.  She was commended for her efforts in decreasing tobacco use and she was amenable to prescription of patch and gum to aid in cessation.  Otherwise, no changes to plan of care.  Plan to return to care by phone in 3 months.  Identifying Information: [name] is a 51 y.o. female with a history of MDD, prolonged bereavement, T2DM, and migraines who is an established patient with [facility] participating in follow-up via video conferencing.   Plan:  # MDD in remission  History of prolonged grief disorder Past medication trials: n/a Status of problem: in remission Interventions: -- Continue Cymbalta 60 mg BID -- Continue trazodone 50-100 mg nightly PRN sleep  # Nicotine use and cessation Past medication trials: n/a Status of problem: improving Interventions: -- START nicotine patch 7 mg daily + nicorette gum 2 mg PRN  Patient was given contact information for behavioral health clinic and was instructed to call 911 for emergencies.   Subjective:  Chief Complaint:  Chief Complaint  Patient presents with   Medication Management    Interval History:   Last seen by Eulis Canner, NP on 10/26/21. At that time managed on: Cymbalta 60 mg BID Trazodone 100 mg nightly No changes made during that visit.  Reports things have been going overall well. Describes mood as 9.5/10. Denies feeling persistently low or depressed; endorses good energy and motivation; enjoys times with grandkids; appetite is "great." Denies SI, HI, AVH.   Continues to take Cymbalta as prescribed; denies adverse  effects.  Taking trazodone 50-100 mg PRN about 2-3 nights out of the week - finds it very helpful. Denies any trouble sleeping; denies daytime drowsiness.   Reports she has cut back on smoking - now down to 3 cigarettes/day. Has tried patches intermittently and is amenable to this provider ordering patches and gum for her to aid in cessation.  Patient in agreement with continuing medications as prescribed.  Visit Diagnosis:    ICD-10-CM   1. MDD (recurrent major depressive disorder) in remission ([place])  F33.40     2. Tobacco use  Z72.0       Past Psychiatric History:  Diagnoses: MDD, prolonged grief disorder Medication trials: none prior to Cymbalta and trazodone Hospitalizations: denies Suicide attempts: denies Substance use: denies use of etoh, or illicit drug use  -- Tobacco: 3 cigarettes/day (a month ago was smoking 1/2 pack per day). Currently using  Past Medical History:  Past Medical History:  Diagnosis Date   Anxiety    Common migraine with intractable migraine 10/02/2016   Depression    Diabetes mellitus    Hypertension     Past Surgical History:  Procedure Laterality Date   CHOLECYSTECTOMY     TUBAL LIGATION      Family Psychiatric History: denies  Family History:  Family History  Problem Relation Age of Onset   Diabetes Mother    Hypertension Mother    Renal Disease Mother    Cancer Father    Diabetes Sister    Diabetes Brother    Renal Disease Brother    Diabetes Brother    Diabetes Sister    Diabetes Brother  Diabetes Brother    Diabetes Brother    Healthy Daughter    Healthy Daughter     Social History:  Social History   Socioeconomic History   Marital status: Single    Spouse name: Not on file   Number of children: Not on file   Years of education: Not on file   Highest education level: Not on file  Occupational History   Not on file  Tobacco Use   Smoking status: Every Day    Packs/day: 0.25    Types: Cigarettes   Smokeless  tobacco: Never  Vaping Use   Vaping Use: Former  Substance and Sexual Activity   Alcohol use: No   Drug use: No   Sexual activity: Not on file  Other Topics Concern   Not on file  Social History Narrative   Right handed    Lives with daughter   Caffeine use: Drinks coffee/tea/soda sometimes   Social Determinants of Radio broadcast assistant Strain: Not on file  Food Insecurity: Not on file  Transportation Needs: Not on file  Physical Activity: Not on file  Stress: Not on file  Social Connections: Not on file    Allergies:  Allergies  Allergen Reactions   Lisinopril     angioedema   Trulicity [Dulaglutide] Other (See Comments)    Gas, abdominal bloating    Current Medications: Current Outpatient Medications  Medication Sig Dispense Refill   DULoxetine (CYMBALTA) 60 MG capsule TAKE 1 CAPSULE (60 MG TOTAL) BY MOUTH 2 (TWO) TIMES DAILY (AM+BEDTIME) 90 capsule 3   nicotine (NICODERM CQ - DOSED IN MG/24 HR) 7 mg/24hr patch Place 1 patch (7 mg total) onto the skin daily. Remove before bedtime. 28 patch 5   nicotine polacrilex (NICORETTE) 2 MG gum Take 1 each (2 mg total) by mouth as needed for smoking cessation. Use every 1-2 hours as needed. 100 tablet 5   traZODone (DESYREL) 100 MG tablet TAKE 1 TABLET (100 MG TOTAL) BY MOUTH AT BEDTIME. 30 tablet 3   Accu-Chek Softclix Lancets lancets Use as instructed 100 each 12   albuterol (VENTOLIN HFA) 108 (90 Base) MCG/ACT inhaler Inhale 1-2 puffs into the lungs every 6 (six) hours as needed for wheezing or shortness of breath. 8 each 0   albuterol (VENTOLIN HFA) 108 (90 Base) MCG/ACT inhaler Inhale 1-2 puffs into the lungs every 6 (six) hours as needed for up to 14 days for wheezing or shortness of breath. 1 each 0   amLODipine (NORVASC) 10 MG tablet TAKE 1 TABLET (10 MG TOTAL) BY MOUTH DAILY. (AM) 90 tablet 0   aspirin EC 81 MG tablet Take 81 mg by mouth every 6 (six) hours as needed for mild pain.      atorvastatin (LIPITOR) 40 MG  tablet Take 1 tablet (40 mg total) by mouth daily. 30 tablet 2   blood glucose meter kit and supplies Dispense based on patient and insurance preference. Use up to four times daily as directed. (FOR ICD-10 E10.9, E11.9). 1 each 0   Dulaglutide (TRULICITY) 0.10 UV/2.5DG SOPN INJECT 0.75 MG INTO THE SKIN ONCE A WEEK. 2 mL 0   EASY COMFORT PEN NEEDLES 31G X 8 MM MISC USE TO INJECT LEVEMIR TWICE A DAY 100 each 2   fluconazole (DIFLUCAN) 150 MG tablet Take 1 tablet (150 mg total) by mouth every 3 (three) days. 2 tablet 0   fluticasone (FLONASE) 50 MCG/ACT nasal spray Place 2 sprays into both nostrils daily. 16 mL  0   fluticasone (FLONASE) 50 MCG/ACT nasal spray Place 2 sprays into both nostrils daily for 14 days. 16 g 0   fluticasone (FLOVENT HFA) 44 MCG/ACT inhaler Inhale 2 puffs into the lungs 2 (two) times daily. 1 Inhaler 12   gabapentin (NEURONTIN) 300 MG capsule TAKE ONE CAPSULE BY MOUTH THREE TIMES DAILY ( AM+NOON+BEDTIME) 90 capsule 2   glucose blood (ACCU-CHEK GUIDE) test strip 1 each by Other route 3 (three) times daily after meals. Use as instructed 100 each 12   HYDROcodone-acetaminophen (NORCO) 5-325 MG tablet Take 1 tablet by mouth every 4 (four) hours as needed for moderate pain. 10 tablet 0   ibuprofen (ADVIL) 800 MG tablet TAKE 1 TABLET (800 MG TOTAL) BY MOUTH EVERY 8 (EIGHT) HOURS AS NEEDED. 90 tablet 1   insulin glargine (LANTUS SOLOSTAR) 100 UNIT/ML Solostar Pen Inject 70 Units into the skin daily. 30 mL 2   Lancets (ACCU-CHEK SOFT TOUCH) lancets Use as instructed 100 each 12   lidocaine (XYLOCAINE) 2 % jelly Place 1 Application into the urethra as needed. 30 mL 0   losartan (COZAAR) 50 MG tablet TAKE 1 TABLET (50 MG TOTAL) BY MOUTH DAILY (AM) 90 tablet 1   metFORMIN (GLUCOPHAGE) 1000 MG tablet TAKE ONE TABLET BY MOUTH TWICE DAILY (AM+BEDTIME) 60 tablet 0   metroNIDAZOLE (FLAGYL) 500 MG tablet Take 1 tablet (500 mg total) by mouth 2 (two) times daily. 14 tablet 0   ondansetron  (ZOFRAN) 4 MG tablet Take 1 tablet (4 mg total) by mouth every 8 (eight) hours as needed for nausea or vomiting. 20 tablet 0   ondansetron (ZOFRAN-ODT) 4 MG disintegrating tablet 73m ODT q4 hours prn nausea/vomit 20 tablet 0   pantoprazole (PROTONIX) 40 MG tablet TAKE 1 TABLET (40 MG TOTAL) BY MOUTH DAILY (AM) 30 tablet 3   No current facility-administered medications for this visit.    ROS: Does not endorse any physical complaints  Objective:  Psychiatric Specialty Exam: There were no vitals taken for this visit.There is no height or weight on file to calculate BMI.  General Appearance:  Unable to assess due to phone visit  Eye Contact:   Unable to assess due to phone visit  Speech:  Clear and Coherent and Normal Rate  Volume:  Normal  Mood:   "good"  Affect:   Unable to assess due to phone visit  Thought Content:  Denies AVH, IOR    Suicidal Thoughts:  No  Homicidal Thoughts:  No  Thought Process:  Goal Directed and Linear  Orientation:  Full (Time, Place, and Person)    Memory:   Grossly intact  Judgment:  Good  Insight:  Good  Concentration:  Concentration: Good  Recall:  NA  Fund of Knowledge: Good  Language: Good  Psychomotor Activity:   Unable to assess due to phone visit  Akathisia:   Unable to assess due to phone visit  AIMS (if indicated): not done  Assets:  Communication Skills Desire for Improvement Financial Resources/Insurance Housing Leisure Time Physical Health Resilience Social Support Transportation  ADL's:  Intact  Cognition: WNL  Sleep:  Good   PE: unable to assess due to phone visit  Metabolic Disorder Labs: Lab Results  Component Value Date   HGBA1C 8.5 (A) 09/16/2021   No results found for: "PROLACTIN" Lab Results  Component Value Date   CHOL 222 (H) 01/07/2021   TRIG 121 01/07/2021   HDL 42 01/07/2021   CHOLHDL 5.3 (H) 01/07/2021   LDLCALC 158 (  H) 01/07/2021   LDLCALC 125 (H) 08/01/2018   Lab Results  Component Value Date    TSH 2.870 10/02/2018   TSH 4.840 (H) 01/09/2018    Therapeutic Level Labs: No results found for: "LITHIUM" No results found for: "VALPROATE" No results found for: "CBMZ"  Screenings:  GAD-7    Flowsheet Row Video Visit from 10/26/2021 in [facility] Video Visit from 05/19/2021 in [facility] Video Visit from 02/23/2021 in [facility] Office Visit from 01/31/2021 in [facility] Video Visit from 11/23/2020 in [facility]  Total GAD-7 Score _0 0 18      PHQ2-9    Flowsheet Row Video Visit from 10/26/2021 in [facility] Video Visit from 05/19/2021 in [facility] Video Visit from 02/23/2021 in [facility] Office Visit from 01/31/2021 in [place] Video Visit from 11/23/2020 in [facility]  PHQ-2 Total Score 0 6 4 0 4  PHQ-9 Total Score _1 -- 14      Flowsheet Row ED from 12/19/2021 in [facility] ED from 08/10/2021 in [facility] ED from 07/05/2021 in [facility]  C-SSRS RISK CATEGORY No Risk No Risk No Risk       Collaboration of Care: Collaboration of Care: Medication Management AEB ongoing medication management and Psychiatrist AEB established with this provider  Patient/Guardian was advised Release of Information must be obtained prior to any record release in order to collaborate their care with an outside provider. Patient/Guardian was advised if they have not already done so to contact the registration department to sign all necessary forms in order for [place] to release information regarding their care.   Consent: Patient/Guardian gives verbal consent for treatment and assignment of benefits for services provided during  this visit. Patient/Guardian expressed understanding and agreed to proceed.   Virtual Visit via Telephone Note  I connected with Deyna Carbon on 02/06/22 at  3:30 PM EDT by telephone and verified that I am speaking with the correct person using two identifiers.  Location: Patient: [place] Provider: remote office in [place]   I discussed the limitations, risks, security and privacy concerns of performing an evaluation and management service by telephone and the availability of in person appointments. I also discussed with the patient that there may be a patient responsible charge related to this service. The patient expressed understanding and agreed to proceed.   I discussed the assessment and treatment plan with the patient. The patient was provided an opportunity to ask questions and all were answered. The patient agreed with the plan and demonstrated an understanding of the instructions.   The patient was advised to call back or seek an in-person evaluation if the symptoms worsen or if the condition fails to improve as anticipated.  I provided 35 minutes of non-face-to-face time during this encounter.  Donita Newland A Gissel Keilman 02/06/2022, 5:28 PM

## 2022-02-06 ENCOUNTER — Encounter (HOSPITAL_COMMUNITY): Payer: Self-pay | Admitting: Psychiatry

## 2022-02-06 ENCOUNTER — Telehealth (INDEPENDENT_AMBULATORY_CARE_PROVIDER_SITE_OTHER): Payer: Medicaid Other | Admitting: Psychiatry

## 2022-02-06 DIAGNOSIS — F3340 Major depressive disorder, recurrent, in remission, unspecified: Secondary | ICD-10-CM

## 2022-02-06 DIAGNOSIS — Z720 Tobacco use: Secondary | ICD-10-CM | POA: Diagnosis not present

## 2022-02-06 MED ORDER — NICOTINE 7 MG/24HR TD PT24
7.0000 mg | MEDICATED_PATCH | Freq: Every day | TRANSDERMAL | 5 refills | Status: DC
Start: 2022-02-06 — End: 2022-10-13

## 2022-02-06 MED ORDER — NICOTINE POLACRILEX 2 MG MT GUM: 2.0000 mg | CHEWING_GUM | OROMUCOSAL | 5 refills | Status: DC | PRN

## 2022-02-06 NOTE — Telephone Encounter (Signed)
Returned because Not sure if still taking the Protonix 40 mg.  The Celebrex 200 mg was discontinued 2/22023.   The Pravastatin 40 mg was discontinued 11/82022.

## 2022-02-06 NOTE — Telephone Encounter (Signed)
Will forward to provider  

## 2022-02-28 ENCOUNTER — Other Ambulatory Visit: Payer: Self-pay | Admitting: Family Medicine

## 2022-02-28 NOTE — Telephone Encounter (Signed)
Refilled today. Requested Prescriptions  Pending Prescriptions Disp Refills   Insulin Glargine Solostar (LANTUS) 100 UNIT/ML Solostar Pen [Pharmacy Med Name: INSULIN GLARGINE SOLOSTAR 100 UNIT/ML SUBCUTANEOUS SOLUTION PEN-INJECTOR] 30 mL 0    Sig: INJECT 70 UNITS INTO THE SKIN DAILY.     Endocrinology:  Diabetes - Insulins Failed - 02/28/2022  1:54 PM      Failed - HBA1C is between 0 and 7.9 and within 180 days    HbA1c, POC (controlled diabetic range)  Date Value Ref Range Status  09/16/2021 8.5 (A) 0.0 - 7.0 % Final         Passed - Valid encounter within last 6 months    Recent Outpatient Visits           5 months ago Vaginal irritation   [facility] Gwinda Passe P, NP   5 months ago Type 2 diabetes mellitus without complication, with long-term current use of insulin [facility])   [facility] [place], Jeannett Senior L, RPH-CPP   9 months ago Type 2 diabetes mellitus without complication, with long-term current use of insulin [facility])   [facility] [place], Jeannett Senior L, RPH-CPP   11 months ago Type 2 diabetes mellitus without complication, with long-term current use of insulin [facility])   [place] [facility] [place], Jeannett Senior L, RPH-CPP   1 year ago Type 2 diabetes mellitus without complication, with long-term current use of insulin [facility])   [facility] [place], Cornelius Moras, RPH-CPP

## 2022-02-28 NOTE — Telephone Encounter (Signed)
Requested Prescriptions  Pending Prescriptions Disp Refills   Insulin Glargine Solostar (LANTUS) 100 UNIT/ML Solostar Pen [Pharmacy Med Name: INSULIN GLARGINE SOLOSTAR 100 UNIT/ML SUBCUTANEOUS SOLUTION PEN-INJECTOR] 30 mL 0    Sig: INJECT 70 UNITS INTO THE SKIN DAILY.     Endocrinology:  Diabetes - Insulins Failed - 02/28/2022  1:42 PM      Failed - HBA1C is between 0 and 7.9 and within 180 days    HbA1c, POC (controlled diabetic range)  Date Value Ref Range Status  09/16/2021 8.5 (A) 0.0 - 7.0 % Final         Passed - Valid encounter within last 6 months    Recent Outpatient Visits           5 months ago Vaginal irritation   [facility] Gwinda Passe P, NP   5 months ago Type 2 diabetes mellitus without complication, with long-term current use of insulin [facility])   [place] [facility] [place], Jeannett Senior L, RPH-CPP   9 months ago Type 2 diabetes mellitus without complication, with long-term current use of insulin [facility])   [facility] [place], Jeannett Senior L, RPH-CPP   11 months ago Type 2 diabetes mellitus without complication, with long-term current use of insulin [facility])   [place] [facility] [place], Jeannett Senior L, RPH-CPP   1 year ago Type 2 diabetes mellitus without complication, with long-term current use of insulin [facility])   [facility] [place], Cornelius Moras, RPH-CPP

## 2022-03-30 ENCOUNTER — Other Ambulatory Visit: Payer: Self-pay

## 2022-04-17 ENCOUNTER — Ambulatory Visit (INDEPENDENT_AMBULATORY_CARE_PROVIDER_SITE_OTHER): Payer: Medicaid Other | Admitting: Physician Assistant

## 2022-04-17 ENCOUNTER — Encounter: Payer: Self-pay | Admitting: Physician Assistant

## 2022-04-17 DIAGNOSIS — M170 Bilateral primary osteoarthritis of knee: Secondary | ICD-10-CM | POA: Diagnosis not present

## 2022-04-17 DIAGNOSIS — M1712 Unilateral primary osteoarthritis, left knee: Secondary | ICD-10-CM | POA: Diagnosis not present

## 2022-04-17 DIAGNOSIS — M1711 Unilateral primary osteoarthritis, right knee: Secondary | ICD-10-CM | POA: Insufficient documentation

## 2022-04-17 MED ORDER — METHYLPREDNISOLONE ACETATE 40 MG/ML IJ SUSP
80.0000 mg | INTRAMUSCULAR | Status: AC | PRN
  Administered 2022-04-17: 80 mg via INTRA_ARTICULAR

## 2022-04-17 MED ORDER — BUPIVACAINE HCL 0.25 % IJ SOLN
2.0000 mL | INTRAMUSCULAR | Status: AC | PRN
  Administered 2022-04-17: 2 mL via INTRA_ARTICULAR

## 2022-04-17 MED ORDER — LIDOCAINE HCL 1 % IJ SOLN
2.0000 mL | INTRAMUSCULAR | Status: AC | PRN
  Administered 2022-04-17: 2 mL

## 2022-04-17 NOTE — Progress Notes (Signed)
Office Visit Note   Patient: [name]           Date of Birth: 06-07-1970           MRN: [id number] Visit Date: 04/17/2022              Requested by: Grayce Sessions, NP [address] [place],  [place] [address] PCP: Grayce Sessions, NP  Chief Complaint  Patient presents with  . Right Knee - Pain  . Left Knee - Pain      HPI: [name] is a pleasant 52 year old woman with a history of bilateral knee arthritis.  She has gotten injections in the past.  She is now having more pain in her knees and she is due to go on a cruise in a couple weeks.  Assessment & Plan: Visit Diagnoses:  1. Primary osteoarthritis of both knees     Plan: Go forward with a steroid injection in the left knee.  Her most recent A1c is 8.5 so I asked that she not get another injection until late next week right before she leaves on her cruise.  She is in agreement with this plan  Follow-Up Instructions: Return in about 10 days (around 04/27/2022).   Ortho Exam  Patient is alert, oriented, no adenopathy, well-dressed, normal affect, normal respiratory effort. Examination of her knees no effusion no redness no erythema.  She is neurovascularly intact and her compartments are soft and nontender  Imaging: No results found. No images are attached to the encounter.  Labs: Lab Results  Component Value Date   HGBA1C 8.5 (A) 09/16/2021   HGBA1C 10.0 (A) 05/18/2021   HGBA1C 9.4 (A) 01/07/2021   ESRSEDRATE 50 (H) 01/09/2018   CRP 9 01/09/2018   REPTSTATUS 04/21/2016 FINAL 04/19/2016   CULT FEW STREPTOCOCCUS,BETA HEMOLYTIC NOT GROUP A 04/19/2016     Lab Results  Component Value Date   ALBUMIN 3.5 08/10/2021   ALBUMIN 4.2 07/05/2021   ALBUMIN 4.6 01/07/2021    No results found for: "MG" Lab Results  Component Value Date   VD25OH 13.6 (L) 08/01/2018    No results found for: "PREALBUMIN"    Latest Ref Rng & Units 08/10/2021    3:36 PM 07/05/2021    8:38 PM 01/07/2021   11:28  AM  CBC EXTENDED  WBC 4.0 - 10.5 K/uL 13.1  15.6  12.4   RBC 3.87 - 5.11 MIL/uL 5.78  5.86  6.06   Hemoglobin 12.0 - 15.0 g/dL 41.3  24.4  01.0   HCT 36.0 - 46.0 % 43.3  43.9  46.1   Platelets 150 - 400 K/uL 273  266  299   NEUT# 1.7 - 7.7 K/uL 7.5   7.0   Lymph# 0.7 - 4.0 K/uL 4.7   4.5      There is no height or weight on file to calculate BMI.  Orders:  No orders of the defined types were placed in this encounter.  No orders of the defined types were placed in this encounter.    Procedures: Large Joint Inj on 04/17/2022 1:23 PM Indications: pain and diagnostic evaluation Details: 25 G 1.5 in needle, anteromedial approach  Arthrogram: No  Medications: 80 mg methylPREDNISolone acetate 40 MG/ML; 2 mL lidocaine 1 %; 2 mL bupivacaine 0.25 % Outcome: tolerated well, no immediate complications Procedure, treatment alternatives, risks and benefits explained, specific risks discussed. Consent was given by the patient.    Clinical Data: No additional findings.  ROS:  All other systems negative, except as noted in the HPI. Review of Systems  Objective: Vital Signs: There were no vitals taken for this visit.  Specialty Comments:  No specialty comments available.  PMFS History: Patient Active Problem List   Diagnosis Date Noted  . Osteoarthritis of knees, bilateral 04/17/2022  . Tobacco use 02/06/2022  . MDD (recurrent major depressive disorder) in remission ([place]) 05/13/2020  . 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 Hot flashes 10/02/2018  . Vaginal dryness 10/02/2018  . BIH (benign intracranial hypertension) 11/06/2017  . Somnolence, daytime 07/30/2017  . Snoring 07/30/2017  . Morbid obesity ([place]) 07/30/2017  . Migraine 10/02/2016  . Common migraine with intractable migraine 10/02/2016  . Type 2 diabetes mellitus without complication, without long-term current use of insulin ([place]) 08/17/2016   Past Medical History:  Diagnosis Date  . Anxiety   .  Common migraine with intractable migraine 10/02/2016  . Depression   . Diabetes mellitus   . Hypertension     Family History  Problem Relation Age of Onset  . Diabetes Mother   . Hypertension Mother   . Renal Disease Mother   . Cancer Father   . Diabetes Sister   . Diabetes Brother   . Renal Disease Brother   . Diabetes Brother   . Diabetes Sister   . Diabetes Brother   . Diabetes Brother   . Diabetes Brother   . Healthy Daughter   . Healthy Daughter     Past Surgical History:  Procedure Laterality Date  . CHOLECYSTECTOMY    . TUBAL LIGATION     Social History   Occupational History  . Not on file  Tobacco Use  . Smoking status: Every Day    Packs/day: 0.25    Types: Cigarettes  . Smokeless tobacco: Never  Vaping Use  . Vaping Use: Former  Substance and Sexual Activity  . Alcohol use: No  . Drug use: No  . Sexual activity: Not on file

## 2022-04-18 ENCOUNTER — Telehealth: Payer: Self-pay | Admitting: Physician Assistant

## 2022-04-18 NOTE — Telephone Encounter (Signed)
IC s/w patient and advised. Verbalized understanding.  

## 2022-04-18 NOTE — Telephone Encounter (Signed)
Patients wants to know if she can come in this Friday and get her other corsrtoil shot because her  blood sugar did not go up

## 2022-04-24 ENCOUNTER — Other Ambulatory Visit (INDEPENDENT_AMBULATORY_CARE_PROVIDER_SITE_OTHER): Payer: Self-pay | Admitting: Primary Care

## 2022-04-24 ENCOUNTER — Other Ambulatory Visit: Payer: Self-pay | Admitting: Family Medicine

## 2022-04-24 DIAGNOSIS — E119 Type 2 diabetes mellitus without complications: Secondary | ICD-10-CM

## 2022-04-24 DIAGNOSIS — I10 Essential (primary) hypertension: Secondary | ICD-10-CM

## 2022-04-25 ENCOUNTER — Ambulatory Visit: Payer: Medicaid Other | Admitting: Physician Assistant

## 2022-04-25 NOTE — Telephone Encounter (Signed)
Requested medication (s) are due for refill today: routing for review  Requested medication (s) are on the active medication list: yes  Last refill:  12/08/21  Future visit scheduled: no  Notes to clinic:  Unable to refill per protocol, courtesy refill already given, routing for provider approval.      Requested Prescriptions  Pending Prescriptions Disp Refills   TRULICITY 7.40 CX/4.4YJ SOPN [Pharmacy Med Name: TRULICITY 8.56 DJ/4.9FW SUBCUTANEOUS SOLUTION PEN-INJECTOR] 2 mL 0    Sig: INJECT 0.75 MG INTO THE SKIN ONCE A WEEK.     Endocrinology:  Diabetes - GLP-1 Receptor Agonists Failed - 04/24/2022  2:38 PM      Failed - HBA1C is between 0 and 7.9 and within 180 days    HbA1c, POC (controlled diabetic range)  Date Value Ref Range Status  09/16/2021 8.5 (A) 0.0 - 7.0 % Final         Failed - Valid encounter within last 6 months    Recent Outpatient Visits           7 months ago Vaginal irritation   [facility] Kerin Perna, NP   7 months ago Type 2 diabetes mellitus without complication, with long-term current use of insulin [facility])   [place], Annie Main L, RPH-CPP   11 months ago Type 2 diabetes mellitus without complication, with long-term current use of insulin [facility])   [place], Annie Main L, RPH-CPP   1 year ago Type 2 diabetes mellitus without complication, with long-term current use of insulin [facility])   [place], Jarome Matin, RPH-CPP   1 year ago Type 2 diabetes mellitus without complication, with long-term current use of insulin [facility])   [place], Jarome Matin, RPH-CPP       Future Appointments             Today Persons, Bevely Palmer, [place]   In 2 weeks Kerin Perna, NP [place]

## 2022-04-25 NOTE — Telephone Encounter (Signed)
Courtesy refill. Future appt 05/12/22. Patient out of metformin.  Requested Prescriptions  Pending Prescriptions Disp Refills   metFORMIN (GLUCOPHAGE) 1000 MG tablet [Pharmacy Med Name: METFORMIN HCL 1000 MG ORAL TABLET] 60 tablet 0    Sig: TAKE ONE TABLET BY MOUTH TWICE DAILY (AM+BEDTIME)     Endocrinology:  Diabetes - Biguanides Failed - 04/24/2022  2:38 PM      Failed - HBA1C is between 0 and 7.9 and within 180 days    HbA1c, POC (controlled diabetic range)  Date Value Ref Range Status  09/16/2021 8.5 (A) 0.0 - 7.0 % Final         Failed - B12 Level in normal range and within 720 days    Vitamin B-12  Date Value Ref Range Status  08/01/2018 301 232 - 1,245 pg/mL Final         Failed - Valid encounter within last 6 months    Recent Outpatient Visits           7 months ago Vaginal irritation   [facility] Grayce Sessions, NP   7 months ago Type 2 diabetes mellitus without complication, with long-term current use of insulin (HCC)   [place] [facility] [place], Jeannett Senior L, RPH-CPP   11 months ago Type 2 diabetes mellitus without complication, with long-term current use of insulin (HCC)   [facility] [place], Cornelius Moras, RPH-CPP   1 year ago Type 2 diabetes mellitus without complication, with long-term current use of insulin (HCC)   [facility] [place], Cornelius Moras, RPH-CPP   1 year ago Type 2 diabetes mellitus without complication, with long-term current use of insulin (HCC)   [place] [facility] [place], Cornelius Moras, RPH-CPP       Future Appointments             Today Persons, [place], PA [facility] [place]   In 2 weeks Grayce Sessions, NP [facility]            Passed - Cr in normal range and within 360 days    Creatinine, Ser  Date Value Ref Range Status  08/10/2021 0.87 0.44 - 1.00 mg/dL  Final         Passed - eGFR in normal range and within 360 days    GFR calc Af Amer  Date Value Ref Range Status  12/07/2019 >60 >60 mL/min Final   GFR, Estimated  Date Value Ref Range Status  08/10/2021 >60 >60 mL/min Final    Comment:    (NOTE) Calculated using the CKD-EPI Creatinine Equation (2021)    eGFR  Date Value Ref Range Status  01/07/2021 85 >59 mL/min/1.73 Final         Passed - CBC within normal limits and completed in the last 12 months    WBC  Date Value Ref Range Status  08/10/2021 13.1 (H) 4.0 - 10.5 K/uL Final   RBC  Date Value Ref Range Status  08/10/2021 5.78 (H) 3.87 - 5.11 MIL/uL Final   Hemoglobin  Date Value Ref Range Status  08/10/2021 14.6 12.0 - 15.0 g/dL Final  42/59/5638 75.6 11.1 - 15.9 g/dL Final   HCT  Date Value Ref Range Status  08/10/2021 43.3 36.0 - 46.0 % Final   Hematocrit  Date Value Ref Range Status  01/07/2021 46.1 34.0 - 46.6 % Final  MCHC  Date Value Ref Range Status  08/10/2021 33.7 30.0 - 36.0 g/dL Final   [facility]  Date Value Ref Range Status  08/10/2021 25.3 (L) 26.0 - 34.0 pg Final   MCV  Date Value Ref Range Status  08/10/2021 74.9 (L) 80.0 - 100.0 fL Final  01/07/2021 76 (L) 79 - 97 fL Final   No results found for: "PLTCOUNTKUC", "LABPLAT", "POCPLA" RDW  Date Value Ref Range Status  08/10/2021 14.6 11.5 - 15.5 % Final  01/07/2021 14.2 11.7 - 15.4 % Final          losartan (COZAAR) 50 MG tablet [Pharmacy Med Name: LOSARTAN POTASSIUM 50 MG ORAL TABLET] 90 tablet 0    Sig: TAKE 1 TABLET (50 MG TOTAL) BY MOUTH DAILY (AM)     Cardiovascular:  Angiotensin Receptor Blockers Failed - 04/24/2022  2:38 PM      Failed - Cr in normal range and within 180 days    Creatinine, Ser  Date Value Ref Range Status  08/10/2021 0.87 0.44 - 1.00 mg/dL Final         Failed - K in normal range and within 180 days    Potassium  Date Value Ref Range Status  08/10/2021 4.3 3.5 - 5.1 mmol/L Final         Failed - Last  BP in normal range    BP Readings from Last 1 Encounters:  12/19/21 (!) 141/92         Failed - Valid encounter within last 6 months    Recent Outpatient Visits           7 months ago Vaginal irritation   [place] Juluis Mire P, NP   7 months ago Type 2 diabetes mellitus without complication, with long-term current use of insulin ([place])   [place], Annie Main L, RPH-CPP   11 months ago Type 2 diabetes mellitus without complication, with long-term current use of insulin [facility])   [place], Jarome Matin, RPH-CPP   1 year ago Type 2 diabetes mellitus without complication, with long-term current use of insulin [facility])   [place], [place] L, RPH-CPP   1 year ago Type 2 diabetes mellitus without complication, with long-term current use of insulin [facility])   [place], Jarome Matin, RPH-CPP       Future Appointments             Today Persons, Bevely Palmer, PA [place]   In 2 weeks Kerin Perna, NP [place] - Patient is not pregnant       amLODipine (NORVASC) 10 MG tablet [Pharmacy Med Name: AMLODIPINE BESYLATE 10 MG ORAL TABLET] 90 tablet 0    Sig: TAKE 1 TABLET (10 MG TOTAL) BY MOUTH DAILY. (AM)     Cardiovascular: Calcium Channel Blockers 2 Failed - 04/24/2022  2:38 PM      Failed - Last BP in normal range    BP Readings from Last 1 Encounters:  12/19/21 (!) 141/92         Failed - Valid encounter within last 6 months    Recent Outpatient Visits           7 months ago Vaginal irritation   [place],  [place] Cage, NP   7 months ago Type 2 diabetes mellitus without complication, with long-term current use of insulin [facility])   [place], Annie Main  L, RPH-CPP   11 months ago Type 2 diabetes mellitus without complication, with long-term current use of insulin [facility])   [place], Jarome Matin, RPH-CPP   1 year ago Type 2 diabetes mellitus without complication, with long-term current use of insulin [facility])   [place], Jarome Matin, RPH-CPP   1 year ago Type 2 diabetes mellitus without complication, with long-term current use of insulin [facility])   [place], RPH-CPP       Future Appointments             Today Persons, Bevely Palmer, [place]   In 2 weeks Kerin Perna, NP [facility]            Passed - Last Heart Rate in normal range    Pulse Readings from Last 1 Encounters:  12/19/21 90

## 2022-04-25 NOTE — Telephone Encounter (Signed)
Called patient to schedule appt for medication refills. Scheduled for 05/12/22. Patient requesting labs checked for DM

## 2022-05-11 ENCOUNTER — Telehealth (HOSPITAL_COMMUNITY): Payer: Medicaid Other | Admitting: Psychiatry

## 2022-05-11 ENCOUNTER — Telehealth (HOSPITAL_COMMUNITY): Payer: Medicaid Other | Admitting: Student

## 2022-05-12 ENCOUNTER — Encounter (INDEPENDENT_AMBULATORY_CARE_PROVIDER_SITE_OTHER): Payer: Self-pay | Admitting: Primary Care

## 2022-05-12 ENCOUNTER — Ambulatory Visit (INDEPENDENT_AMBULATORY_CARE_PROVIDER_SITE_OTHER): Payer: Medicaid Other | Admitting: Primary Care

## 2022-05-12 ENCOUNTER — Encounter (HOSPITAL_COMMUNITY): Payer: Self-pay | Admitting: Psychiatry

## 2022-05-12 ENCOUNTER — Telehealth (INDEPENDENT_AMBULATORY_CARE_PROVIDER_SITE_OTHER): Payer: Medicaid Other | Admitting: Psychiatry

## 2022-05-12 VITALS — BP 129/83 | HR 79 | Resp 16 | Ht 65.5 in | Wt 239.6 lb

## 2022-05-12 DIAGNOSIS — F4329 Adjustment disorder with other symptoms: Secondary | ICD-10-CM

## 2022-05-12 DIAGNOSIS — I10 Essential (primary) hypertension: Secondary | ICD-10-CM

## 2022-05-12 DIAGNOSIS — F3341 Major depressive disorder, recurrent, in partial remission: Secondary | ICD-10-CM

## 2022-05-12 DIAGNOSIS — E119 Type 2 diabetes mellitus without complications: Secondary | ICD-10-CM | POA: Diagnosis not present

## 2022-05-12 LAB — POCT GLYCOSYLATED HEMOGLOBIN (HGB A1C): HbA1c, POC (controlled diabetic range): 8.9 % — AB (ref 0.0–7.0)

## 2022-05-12 MED ORDER — TRAZODONE HCL 100 MG PO TABS: 100.0000 mg | ORAL_TABLET | Freq: Every day | ORAL | 3 refills | Status: DC

## 2022-05-12 MED ORDER — DULOXETINE HCL 60 MG PO CPEP: ORAL_CAPSULE | ORAL | 3 refills | Status: DC

## 2022-05-12 NOTE — Progress Notes (Signed)
Subjective:  Patient ID: Deserai Mcdowall, female    DOB: November 25, 1970  Age: 52 y.o. MRN: [id number]  CC: Diabetes and Medication Refill   HPI Symphoni Quandt presents for Follow-up of diabetes. Patient does not check blood sugar at home  Compliant with meds - Yes Checking CBGs? No  Fasting avg -   Postprandial average -  Exercising regularly? - No Watching carbohydrate intake? - Yes Neuropathy ? - Yes Hypoglycemic events - Yes  - Recovers with :   Pertinent ROS:  Polyuria - No Polydipsia - No Vision problems - No Management Of Hypertension- Patient has No headache, No chest pain, No abdominal pain - No Nausea, No new weakness tingling or numbness, No Cough - shortness of breath  Medications as noted below. Taking them regularly without complication/adverse reaction being reported today.   History Roy has a past medical history of Anxiety, Common migraine with intractable migraine (10/02/2016), Depression, Diabetes mellitus, and Hypertension.   She has a past surgical history that includes Cholecystectomy and Tubal ligation.   Her family history includes Cancer in her father; Diabetes in her brother, brother, brother, brother, brother, mother, sister, and sister; Healthy in her daughter and daughter; Hypertension in her mother; Renal Disease in her brother and mother.She reports that she has been smoking cigarettes. She has been smoking an average of .25 packs per day. She has never used smokeless tobacco. She reports that she does not drink alcohol and does not use drugs.  Current Outpatient Medications on File Prior to Visit  Medication Sig Dispense Refill   Accu-Chek Softclix Lancets lancets Use as instructed 100 each 12   albuterol (VENTOLIN HFA) 108 (90 Base) MCG/ACT inhaler Inhale 1-2 puffs into the lungs every 6 (six) hours as needed for wheezing or shortness of breath. 8 each 0   albuterol (VENTOLIN HFA) 108 (90 Base) MCG/ACT inhaler Inhale 1-2 puffs into the lungs every 6  (six) hours as needed for up to 14 days for wheezing or shortness of breath. 1 each 0   amLODipine (NORVASC) 10 MG tablet TAKE 1 TABLET (10 MG TOTAL) BY MOUTH DAILY. (AM) 90 tablet 0   aspirin EC 81 MG tablet Take 81 mg by mouth every 6 (six) hours as needed for mild pain.      atorvastatin (LIPITOR) 40 MG tablet Take 1 tablet (40 mg total) by mouth daily. 30 tablet 2   blood glucose meter kit and supplies Dispense based on patient and insurance preference. Use up to four times daily as directed. (FOR ICD-10 E10.9, E11.9). 1 each 0   celecoxib (CELEBREX) 200 MG capsule TAKE ONE CAPSULE BY MOUTH TWICE DAILY (AM+BEDTIME) 180 capsule 1   EASY COMFORT PEN NEEDLES 31G X 8 MM MISC USE TO INJECT LEVEMIR TWICE A DAY 100 each 2   fluconazole (DIFLUCAN) 150 MG tablet Take 1 tablet (150 mg total) by mouth every 3 (three) days. (Patient not taking: Reported on 05/12/2022) 2 tablet 0   fluticasone (FLONASE) 50 MCG/ACT nasal spray Place 2 sprays into both nostrils daily. 16 mL 0   fluticasone (FLONASE) 50 MCG/ACT nasal spray Place 2 sprays into both nostrils daily for 14 days. 16 g 0   fluticasone (FLOVENT HFA) 44 MCG/ACT inhaler Inhale 2 puffs into the lungs 2 (two) times daily. 1 Inhaler 12   gabapentin (NEURONTIN) 300 MG capsule TAKE ONE CAPSULE BY MOUTH THREE TIMES DAILY ( AM+NOON+BEDTIME) 90 capsule 2   glucose blood (ACCU-CHEK GUIDE) test strip 1 each by Other route  3 (three) times daily after meals. Use as instructed 100 each 12   HYDROcodone-acetaminophen (NORCO) 5-325 MG tablet Take 1 tablet by mouth every 4 (four) hours as needed for moderate pain. (Patient not taking: Reported on 05/12/2022) 10 tablet 0   Insulin Glargine Solostar (LANTUS) 100 UNIT/ML Solostar Pen INJECT 70 UNITS INTO THE SKIN DAILY. 30 mL 0   Lancets (ACCU-CHEK SOFT TOUCH) lancets Use as instructed 100 each 12   lidocaine (XYLOCAINE) 2 % jelly Place 1 Application into the urethra as needed. 30 mL 0   losartan (COZAAR) 50 MG tablet TAKE 1  TABLET (50 MG TOTAL) BY MOUTH DAILY (AM) 90 tablet 0   metFORMIN (GLUCOPHAGE) 1000 MG tablet TAKE ONE TABLET BY MOUTH TWICE DAILY (AM+BEDTIME) 60 tablet 0   metroNIDAZOLE (FLAGYL) 500 MG tablet Take 1 tablet (500 mg total) by mouth 2 (two) times daily. (Patient not taking: Reported on 05/12/2022) 14 tablet 0   nicotine (NICODERM CQ - DOSED IN MG/24 HR) 7 mg/24hr patch Place 1 patch (7 mg total) onto the skin daily. Remove before bedtime. 28 patch 5   nicotine polacrilex (NICORETTE) 2 MG gum Take 1 each (2 mg total) by mouth as needed for smoking cessation. Use every 1-2 hours as needed. 100 tablet 5   ondansetron (ZOFRAN) 4 MG tablet Take 1 tablet (4 mg total) by mouth every 8 (eight) hours as needed for nausea or vomiting. 20 tablet 0   ondansetron (ZOFRAN-ODT) 4 MG disintegrating tablet 52m ODT q4 hours prn nausea/vomit 20 tablet 0   pantoprazole (PROTONIX) 40 MG tablet TAKE 1 TABLET (40 MG TOTAL) BY MOUTH DAILY (AM) 30 tablet 3   pravastatin (PRAVACHOL) 40 MG tablet TAKE 1 TABLET (40 MG TOTAL) BY MOUTH DAILY (BEDTIME) 90 tablet 1   TRULICITY [id number] INJECT 0.75 MG INTO THE SKIN ONCE A WEEK. 2 mL 0   No current facility-administered medications on file prior to visit.    ROS. Comprehensive ROS Pertinent positive and negative noted in HPI    Objective:  Blood Pressure 129/83   Pulse 79   Respiration 16   Height 5' 5.5" (1.664 m)   Weight 239 lb 9.6 oz (108.7 kg)   Oxygen Saturation 100%   Body Mass Index 39.27 kg/m   BP Readings from Last 3 Encounters:  05/12/22 129/83  12/19/21 (Abnormal) 141/92  08/10/21 (Abnormal) 150/71    Wt Readings from Last 3 Encounters:  05/12/22 239 lb 9.6 oz (108.7 kg)  08/10/21 236 lb 5.3 oz (107.2 kg)  07/05/21 236 lb 5.3 oz (107.2 kg)    Physical Exam Constitutional:      Appearance: She is obese.  HENT:     Head: Atraumatic.     Right Ear: Tympanic membrane and external ear normal.     Left Ear: Tympanic membrane and external ear  normal.     Nose: Nose normal.  Eyes:     Extraocular Movements: Extraocular movements intact.  Cardiovascular:     Rate and Rhythm: Normal rate and regular rhythm.  Pulmonary:     Effort: Pulmonary effort is normal.     Breath sounds: Normal breath sounds.  Abdominal:     General: Bowel sounds are normal. There is distension.     Palpations: Abdomen is soft.  Musculoskeletal:        General: Normal range of motion.     Cervical back: Normal range of motion and neck supple.  Skin:    General: Skin is warm  and dry.  Neurological:     Mental Status: She is alert and oriented to person, place, and time.  Psychiatric:        Mood and Affect: Mood normal.        Behavior: Behavior normal.   Lab Results  Component Value Date   HGBA1C 8.9 (A) 05/12/2022   HGBA1C 8.5 (A) 09/16/2021   HGBA1C 10.0 (A) 05/18/2021    Lab Results  Component Value Date   WBC 13.1 (H) 08/10/2021   HGB 14.6 08/10/2021   HCT 43.3 08/10/2021   PLT 273 08/10/2021   GLUCOSE 198 (H) 08/10/2021   CHOL 222 (H) 01/07/2021   TRIG 121 01/07/2021   HDL 42 01/07/2021   LDLCALC 158 (H) 01/07/2021   ALT 32 08/10/2021   AST 26 08/10/2021   NA 141 08/10/2021   K 4.3 08/10/2021   CL 108 08/10/2021   CREATININE 0.87 08/10/2021   BUN 8 08/10/2021   CO2 26 08/10/2021   TSH 2.870 10/02/2018   HGBA1C 8.9 (A) 05/12/2022     Assessment & Plan:   Deaisha was seen today for diabetes and medication refill.  Diagnoses and all orders for this visit:  Type 2 diabetes mellitus without complication, without long-term current use of insulin (HCC) -     POCT glycosylated hemoglobin (Hb A1C) 8.9 up from 8.5  Complications from uncontrolled diabetes -diabetic retinopathy leading to blindness, diabetic nephropathy leading to dialysis, decrease in circulation decrease in sores or wound healing which may lead to amputations and increase of heart attack and stroke   Essential hypertension  Well controlled  BP goal - <  130/80 Explained that having normal blood pressure is the goal and medications are helping to get to goal and maintain normal blood pressure. DIET: Limit salt intake, read nutrition labels to check salt content, limit fried and high fatty foods  Avoid using multisymptom OTC cold preparations that generally contain sudafed which can rise BP. Consult with pharmacist on best cold relief products to use for persons with HTN EXERCISE Discussed incorporating exercise such as walking - 30 minutes most days of the week and can do in 10 minute intervals     I am having [name] maintain her aspirin EC, Flovent HFA, Accu-Chek Softclix Lancets, accu-chek soft touch, blood glucose meter kit and supplies, Accu-Chek Guide, albuterol, fluticasone, ondansetron, atorvastatin, fluticasone, albuterol, HYDROcodone-acetaminophen, ondansetron, Easy Comfort Pen Needles, fluconazole, metroNIDAZOLE, lidocaine, gabapentin, pantoprazole, celecoxib, pravastatin, nicotine, nicotine polacrilex, Insulin Glargine Solostar, Trulicity, metFORMIN, losartan, amLODipine, DULoxetine, and traZODone.    Follow-up:   No follow-ups on file.  The above assessment and management plan was discussed with the patient. The patient verbalized understanding of and has agreed to the management plan. Patient is aware to call the clinic if symptoms fail to improve or worsen. Patient is aware when to return to the clinic for a follow-up visit. Patient educated on when it is appropriate to go to the emergency department.   Juluis Mire, NP-C

## 2022-05-12 NOTE — Progress Notes (Signed)
BH MD/PA/NP OP Progress Note Virtual Visit via Telephone Note  I connected with [name] on 05/12/22 at 10:00 AM EST by telephone and verified that I am speaking with the correct person using two identifiers.  Location: Patient: home Provider: Clinic   I discussed the limitations, risks, security and privacy concerns of performing an evaluation and management service by telephone and the availability of in person appointments. I also discussed with the patient that there may be a patient responsible charge related to this service. The patient expressed understanding and agreed to proceed.   I provided 30 minutes of non-face-to-face time during this encounter.  05/12/2022 9:22 AM [name]  MRN:  [id number]  Chief Complaint: "I've been doing good"  HPI: 52 year old female seen today for follow-up psychiatric evaluation.   She has a psychiatric history of depression and prolonged grief.  Currently she is managed on trazodone 100 mg nightly and Cymbalta 60 mg twice daily.  She notes her medications are effective in managing her psychiatric conditions.  Today patient was unable to logon virtually so assessment was done over the phone.  During exam she was pleasant, cooperative, and engaged in conversation.  She informed Clinical research associate that things are going well.  She informed Clinical research associate that her mood is stable and reports that she has minimal anxiety and depression.  Today provider conducted a GAD-7 and patient scored a 6.  Provider also conducted PHQ-9 patient scored a 10.  She endorses adequate sleep and appetite.  Today she denies SI/HI/VH, mania, paranoia.   Patient informed writer that she continues to have leg and feet pain which she quantifies as 6 out of 10.  She informed Clinical research associate that she takes cortisone injection for her pain.    Today no medication changes made.  She will continue medications as prescribed.  No other concerns at this time.  Provider informed patient that next visit needs  to be virtual or in person.  She notes that she will attempt to set up a virtual outlet.  No other concerns noted at this time. Visit Diagnosis:    ICD-10-CM   1. MDD (major depressive disorder), recurrent, in partial remission (HCC)  F33.41 DULoxetine (CYMBALTA) 60 MG capsule    traZODone (DESYREL) 100 MG tablet    2. 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 DULoxetine (CYMBALTA) 60 MG capsule      Past Psychiatric History:  MDD, grief with prolonged bereavement reaction  Past Medical History:  Past Medical History:  Diagnosis Date   Anxiety    Common migraine with intractable migraine 10/02/2016   Depression    Diabetes mellitus    Hypertension     Past Surgical History:  Procedure Laterality Date   CHOLECYSTECTOMY     TUBAL LIGATION      Family Psychiatric History: Denied any family psychiatric history  Family History:  Family History  Problem Relation Age of Onset   Diabetes Mother    Hypertension Mother    Renal Disease Mother    Cancer Father    Diabetes Sister    Diabetes Brother    Renal Disease Brother    Diabetes Brother    Diabetes Sister    Diabetes Brother    Diabetes Brother    Diabetes Brother    Healthy Daughter    Healthy Daughter     Social History:  Social History   Socioeconomic History   Marital status: Single    Spouse name: Not on file   Number of children:  Not on file   Years of education: Not on file   Highest education level: Not on file  Occupational History   Not on file  Tobacco Use   Smoking status: Every Day    Packs/day: 0.25    Types: Cigarettes   Smokeless tobacco: Never  Vaping Use   Vaping Use: Former  Substance and Sexual Activity   Alcohol use: No   Drug use: No   Sexual activity: Not on file  Other Topics Concern   Not on file  Social History Narrative   Right handed    Lives with daughter   Caffeine use: Drinks coffee/tea/soda sometimes   Social Determinants of Health   Financial Resource  Strain: Not on file  Food Insecurity: Not on file  Transportation Needs: Not on file  Physical Activity: Not on file  Stress: Not on file  Social Connections: Not on file    Allergies:  Allergies  Allergen Reactions   Lisinopril     angioedema   Trulicity [Dulaglutide] Other (See Comments)    Gas, abdominal bloating    Metabolic Disorder Labs: Lab Results  Component Value Date   HGBA1C 8.5 (A) 09/16/2021   No results found for: "PROLACTIN" Lab Results  Component Value Date   CHOL 222 (H) 01/07/2021   TRIG 121 01/07/2021   HDL 42 01/07/2021   CHOLHDL 5.3 (H) 01/07/2021   LDLCALC 158 (H) 01/07/2021   LDLCALC 125 (H) 08/01/2018   Lab Results  Component Value Date   TSH 2.870 10/02/2018   TSH 4.840 (H) 01/09/2018    Therapeutic Level Labs: No results found for: "LITHIUM" No results found for: "VALPROATE" No results found for: "CBMZ"  Current Medications: Current Outpatient Medications  Medication Sig Dispense Refill   Accu-Chek Softclix Lancets lancets Use as instructed 100 each 12   albuterol (VENTOLIN HFA) 108 (90 Base) MCG/ACT inhaler Inhale 1-2 puffs into the lungs every 6 (six) hours as needed for wheezing or shortness of breath. 8 each 0   albuterol (VENTOLIN HFA) 108 (90 Base) MCG/ACT inhaler Inhale 1-2 puffs into the lungs every 6 (six) hours as needed for up to 14 days for wheezing or shortness of breath. 1 each 0   amLODipine (NORVASC) 10 MG tablet TAKE 1 TABLET (10 MG TOTAL) BY MOUTH DAILY. (AM) 90 tablet 0   aspirin EC 81 MG tablet Take 81 mg by mouth every 6 (six) hours as needed for mild pain.      atorvastatin (LIPITOR) 40 MG tablet Take 1 tablet (40 mg total) by mouth daily. 30 tablet 2   blood glucose meter kit and supplies Dispense based on patient and insurance preference. Use up to four times daily as directed. (FOR ICD-10 E10.9, E11.9). 1 each 0   celecoxib (CELEBREX) 200 MG capsule TAKE ONE CAPSULE BY MOUTH TWICE DAILY (AM+BEDTIME) 180 capsule 1    DULoxetine (CYMBALTA) 60 MG capsule TAKE 1 CAPSULE (60 MG TOTAL) BY MOUTH 2 (TWO) TIMES DAILY (AM+BEDTIME) 90 capsule 3   EASY COMFORT PEN NEEDLES 31G X 8 MM MISC USE TO INJECT LEVEMIR TWICE A DAY 100 each 2   fluconazole (DIFLUCAN) 150 MG tablet Take 1 tablet (150 mg total) by mouth every 3 (three) days. 2 tablet 0   fluticasone (FLONASE) 50 MCG/ACT nasal spray Place 2 sprays into both nostrils daily. 16 mL 0   fluticasone (FLONASE) 50 MCG/ACT nasal spray Place 2 sprays into both nostrils daily for 14 days. 16 g 0   fluticasone (  FLOVENT HFA) 44 MCG/ACT inhaler Inhale 2 puffs into the lungs 2 (two) times daily. 1 Inhaler 12   gabapentin (NEURONTIN) 300 MG capsule TAKE ONE CAPSULE BY MOUTH THREE TIMES DAILY ( AM+NOON+BEDTIME) 90 capsule 2   glucose blood (ACCU-CHEK GUIDE) test strip 1 each by Other route 3 (three) times daily after meals. Use as instructed 100 each 12   HYDROcodone-acetaminophen (NORCO) 5-325 MG tablet Take 1 tablet by mouth every 4 (four) hours as needed for moderate pain. 10 tablet 0   Insulin Glargine Solostar (LANTUS) 100 UNIT/ML Solostar Pen INJECT 70 UNITS INTO THE SKIN DAILY. 30 mL 0   Lancets (ACCU-CHEK SOFT TOUCH) lancets Use as instructed 100 each 12   lidocaine (XYLOCAINE) 2 % jelly Place 1 Application into the urethra as needed. 30 mL 0   losartan (COZAAR) 50 MG tablet TAKE 1 TABLET (50 MG TOTAL) BY MOUTH DAILY (AM) 90 tablet 0   metFORMIN (GLUCOPHAGE) 1000 MG tablet TAKE ONE TABLET BY MOUTH TWICE DAILY (AM+BEDTIME) 60 tablet 0   metroNIDAZOLE (FLAGYL) 500 MG tablet Take 1 tablet (500 mg total) by mouth 2 (two) times daily. 14 tablet 0   nicotine (NICODERM CQ - DOSED IN MG/24 HR) 7 mg/24hr patch Place 1 patch (7 mg total) onto the skin daily. Remove before bedtime. 28 patch 5   nicotine polacrilex (NICORETTE) 2 MG gum Take 1 each (2 mg total) by mouth as needed for smoking cessation. Use every 1-2 hours as needed. 100 tablet 5   ondansetron (ZOFRAN) 4 MG tablet Take 1  tablet (4 mg total) by mouth every 8 (eight) hours as needed for nausea or vomiting. 20 tablet 0   ondansetron (ZOFRAN-ODT) 4 MG disintegrating tablet 4mg  ODT q4 hours prn nausea/vomit 20 tablet 0   pantoprazole (PROTONIX) 40 MG tablet TAKE 1 TABLET (40 MG TOTAL) BY MOUTH DAILY (AM) 30 tablet 3   pravastatin (PRAVACHOL) 40 MG tablet TAKE 1 TABLET (40 MG TOTAL) BY MOUTH DAILY (BEDTIME) 90 tablet 1   traZODone (DESYREL) 100 MG tablet Take 1 tablet (100 mg total) by mouth at bedtime. 30 tablet 3   TRULICITY 6.81 LX/7.2IO SOPN INJECT 0.75 MG INTO THE SKIN ONCE A WEEK. 2 mL 0   No current facility-administered medications for this visit.     Musculoskeletal: Strength & Muscle Tone:  Unable to assess due to telephone visit Gait & Station:  Unable to assess due to telephone visit Patient leans: N/A  Psychiatric Specialty Exam: Review of Systems  There were no vitals taken for this visit.There is no height or weight on file to calculate BMI.  General Appearance:  Unable to assess due to telephone visit  Eye Contact:   Unable to assess due to telephone visit  Speech:  Clear and Coherent and Normal Rate  Volume:  Normal  Mood:  Euthymic  Affect:   Unable to assess due to telephone visit  Thought Process:  Coherent, Goal Directed, and Linear  Orientation:  Full (Time, Place, and Person)  Thought Content: WDL and Logical   Suicidal Thoughts:  No  Homicidal Thoughts:  No  Memory:  Immediate;   Good Recent;   Good Remote;   Good  Judgement:  Good  Insight:  Good  Psychomotor Activity:   Unable to assess due to telephone visit  Concentration:  Concentration: Good and Attention Span: Good  Recall:  Good  Fund of Knowledge: Good  Language: Good  Akathisia:  No  Handed:  Right  AIMS (if indicated):  not done  Assets:  Communication Skills Desire for Improvement Financial Resources/Insurance Housing Leisure Time Physical Health Social Support  ADL's:  Intact  Cognition: WNL  Sleep:   Good   Screenings: GAD-7    Flowsheet Row Video Visit from 05/12/2022 in [facility] Video Visit from 10/26/2021 in [facility] Video Visit from 05/19/2021 in [facility] Video Visit from 02/23/2021 in [facility] Office Visit from 01/31/2021 in [place]  Total GAD-7 Score 6 9 18 11  0      PHQ2-9    Flowsheet Row Video Visit from 05/12/2022 in [facility] Video Visit from 10/26/2021 in [facility] Video Visit from 05/19/2021 in [facility] Video Visit from 02/23/2021 in [facility] Office Visit from 01/31/2021 in [place]  PHQ-2 Total Score 3 0 6 4 0  PHQ-9 Total Score 10 6 17 13  --      Flowsheet Row ED from 12/19/2021 in [facility] at [facility] ED from 08/10/2021 in [facility] Emergency Department at [facility] ED from 07/05/2021 in [facility] Emergency Department at [place] No Risk No Risk No Risk        Assessment and Plan: Patient notes that she is doing well on her current medication regimen.  No medication changes made today.  Patient agreeable to continue medications as prescribed. Provider informed patient that next visit needs to be virtual or in person.  She notes that she will attempt to set up a virtual outlet.  No other concerns noted at this time.  1. MDD (major depressive disorder), recurrent, in partial remission (HCC)  Continue- DULoxetine (CYMBALTA) 60 MG capsule; Take 1 capsule (60 mg total) by mouth 2 (two) times daily.  Dispense: 90 capsule; Refill: 3 Continue- traZODone (DESYREL) 100 MG tablet; Take 1 tablet (100 mg total) by mouth at bedtime.  Dispense: 30 tablet; Refill: 3  2. Grief reaction with prolonged  bereavement  Continue- DULoxetine (CYMBALTA) 60 MG capsule; Take 1 capsule (60 mg total) by mouth 2 (two) times daily.  Dispense: 90 capsule; Refill: 3   Follow-up in 3 months Salley Slaughter, NP 05/12/2022, 9:22 AM

## 2022-05-15 ENCOUNTER — Telehealth: Payer: Self-pay | Admitting: Emergency Medicine

## 2022-05-15 MED ORDER — OZEMPIC (0.25 OR 0.5 MG/DOSE) 2 MG/3ML ~~LOC~~ SOPN: 0.2500 mg | PEN_INJECTOR | SUBCUTANEOUS | 1 refills | Status: DC

## 2022-05-15 NOTE — Telephone Encounter (Signed)
Copied from [place] [id number]. Topic: General - Other >> May 15, 2022 10:57 AM Oley Balm A wrote:  Reason for CRM: Pt is calling requesting to speak with Lurena Joiner, per pt it is very important that she speak with Lurena Joiner today.  Pt does have an upcoming appt with Lurena Joiner on 05/29/22.  Please advise

## 2022-05-15 NOTE — Addendum Note (Signed)
Addended by: Daisy Blossom, Annie Main L on: 05/15/2022 02:16 PM   Modules accepted: Orders

## 2022-05-15 NOTE — Telephone Encounter (Signed)
Call placed to patient. She is requesting to switch from Trulicity to Apex. She has an appt with me on 2/19. Will go ahead and have her stop Trulicity and start Ozempic in its place at 0.25 mg weekly. She verbalizes understanding of the plan and will follow-up with me in two weeks.

## 2022-05-17 DIAGNOSIS — G4733 Obstructive sleep apnea (adult) (pediatric): Secondary | ICD-10-CM | POA: Diagnosis not present

## 2022-05-23 ENCOUNTER — Ambulatory Visit (INDEPENDENT_AMBULATORY_CARE_PROVIDER_SITE_OTHER): Payer: Medicaid Other | Admitting: Primary Care

## 2022-05-23 ENCOUNTER — Ambulatory Visit (INDEPENDENT_AMBULATORY_CARE_PROVIDER_SITE_OTHER): Payer: Medicaid Other

## 2022-05-23 DIAGNOSIS — Z111 Encounter for screening for respiratory tuberculosis: Secondary | ICD-10-CM | POA: Diagnosis not present

## 2022-05-23 NOTE — Progress Notes (Signed)
PPD nurse visit

## 2022-05-25 LAB — TB SKIN TEST
Induration: 0 mm
TB Skin Test: NEGATIVE

## 2022-05-25 NOTE — Addendum Note (Signed)
Addended by: Verdell Carmine on: 05/25/2022 11:23 AM   Modules accepted: Orders

## 2022-05-27 ENCOUNTER — Other Ambulatory Visit: Payer: Self-pay | Admitting: Primary Care

## 2022-05-29 ENCOUNTER — Other Ambulatory Visit (HOSPITAL_COMMUNITY): Payer: Self-pay | Admitting: Psychiatry

## 2022-05-29 ENCOUNTER — Ambulatory Visit: Payer: Medicaid Other | Attending: Primary Care | Admitting: Pharmacist

## 2022-05-29 ENCOUNTER — Other Ambulatory Visit (INDEPENDENT_AMBULATORY_CARE_PROVIDER_SITE_OTHER): Payer: Self-pay | Admitting: Primary Care

## 2022-05-29 DIAGNOSIS — F3341 Major depressive disorder, recurrent, in partial remission: Secondary | ICD-10-CM

## 2022-05-29 DIAGNOSIS — E119 Type 2 diabetes mellitus without complications: Secondary | ICD-10-CM

## 2022-05-29 DIAGNOSIS — Z794 Long term (current) use of insulin: Secondary | ICD-10-CM | POA: Diagnosis not present

## 2022-05-29 DIAGNOSIS — I10 Essential (primary) hypertension: Secondary | ICD-10-CM

## 2022-05-29 MED ORDER — INSULIN GLARGINE SOLOSTAR 100 UNIT/ML ~~LOC~~ SOPN: 60.0000 [IU] | PEN_INJECTOR | Freq: Every day | SUBCUTANEOUS | 1 refills | Status: DC

## 2022-05-29 MED ORDER — OZEMPIC (0.25 OR 0.5 MG/DOSE) 2 MG/3ML ~~LOC~~ SOPN: 0.5000 mg | PEN_INJECTOR | SUBCUTANEOUS | 1 refills | Status: DC

## 2022-05-29 NOTE — Progress Notes (Signed)
S:     No chief complaint on file.  52 y.o. female who presents for diabetes evaluation, education, and management.  PMH is significant for migraines,T2DM, benign intracranial HTN, osteoarthritis.   Patient was last seen and referred by Juluis Mire on 05/12/22. At last visit, A1c was 8.9%. Initiated Ozempic 0.25 mg.  Today, patient arrives in good spirits and presents without any assistance.  Patient reports Diabetes was diagnosed in 2018.   Family history: T2DM, HTN, renal disease Social History: Smokes 0.25 PPD.  Current diabetes medications include: metformin 1000 mg BID, Ozempic 0.25 mg weekly, Lantus 70 units daily Current hyperlipidemia medications include: pravastatin 40 mg daily  Patient reports adherence to taking all medications as prescribed.  Reported missing 2 doses of Lantus in the past week due to not having refills.  Do you feel that your medications are working for you? yes Have you been experiencing any side effects to the medications prescribed? no Do you have any problems obtaining medications due to transportation or finances? no Insurance coverage: [place] Medicaid  Patient denies hypoglycemic events.  Reported home blood sugars: Reports taking measurements twice weekly. States they have been between 90-139. Reports highest number seen in the past month was 189.  Patient denies nocturia (nighttime urination).  Patient denies neuropathy (nerve pain). Patient denies visual changes. Patient reports self foot exams.   Patient reported dietary habits: Reports restricting sugars. [place] chicken, fish, and vegetables.   Patient-reported exercise habits: endorses exercising daily.  O:  ROS Physical Exam  Lab Results  Component Value Date   HGBA1C 8.9 (A) 05/12/2022   There were no vitals filed for this visit.  Lipid Panel     Component Value Date/Time   CHOL 222 (H) 01/07/2021 1128   TRIG 121 01/07/2021 1128   HDL 42 01/07/2021 1128   CHOLHDL  5.3 (H) 01/07/2021 1128   LDLCALC 158 (H) 01/07/2021 1128    Clinical Atherosclerotic Cardiovascular Disease (ASCVD): No  The 10-year ASCVD risk score (Arnett DK, et al., 2019) is: 28.9%   Values used to calculate the score:     Age: 66 years     Sex: Female     Is Non-Hispanic African American: Yes     Diabetic: Yes     Tobacco smoker: Yes     Systolic Blood Pressure: [id number] mmHg     Is BP treated: Yes     HDL Cholesterol: 42 mg/dL     Total Cholesterol: 222 mg/dL   Patient is participating in a Managed Medicaid Plan:  Yes   A/P: Diabetes longstanding currently uncontrolled. Patient is able to verbalize appropriate hypoglycemia management plan. Medication adherence appears appropriate. -Decreased dose of basal insulin Lantus (insulin glargine) from 70 units to 60 units daily in the morning.  -Increased dose of GLP-1 Ozempic (semaglutide) from 0.25 mg to 0.5 mg. Finish 4 doses of 0.25 mg, then increase to 0.5 mg weekly. -Continued metformin 1000 mg BID.  -Patient educated on purpose, proper use, and potential adverse effects of Ozempic.  -Extensively discussed pathophysiology of diabetes, recommended lifestyle interventions, dietary effects on blood sugar control.  -Counseled on s/sx of and management of hypoglycemia.  -Next A1c anticipated 05/24.   Written patient instructions provided. Patient verbalized understanding of treatment plan.  Total time in face to face counseling 30 minutes.    Follow-up:  Pharmacist in 1 month. PCP clinic visit in 05/24.   Patient seen with: Lillard Anes PharmD candidate co [id number]  Daisy Blossom, PharmD, [place], [place] [phone]

## 2022-05-29 NOTE — Telephone Encounter (Signed)
Requested Prescriptions  Refused Prescriptions Disp Refills   Insulin Glargine Solostar (LANTUS) 100 UNIT/ML Solostar Pen [Pharmacy Med Name: INSULIN GLARGINE SOLOSTAR 100 UNIT/ML SUBCUTANEOUS SOLUTION PEN-INJECTOR] 30 mL 0    Sig: INJECT 70 UNITS INTO THE SKIN DAILY.     Endocrinology:  Diabetes - Insulins Failed - 05/27/2022 10:24 AM      Failed - HBA1C is between 0 and 7.9 and within 180 days    HbA1c, POC (controlled diabetic range)  Date Value Ref Range Status  05/12/2022 8.9 (A) 0.0 - 7.0 % Final         Passed - Valid encounter within last 6 months    Recent Outpatient Visits           Today Type 2 diabetes mellitus without complication, with long-term current use of insulin [facility])   [place], [place] L, RPH-CPP   2 weeks ago Type 2 diabetes mellitus without complication, without long-term current use of insulin ([place])   [place] Kerin Perna, NP   8 months ago Vaginal irritation   [place], [place] P, NP   8 months ago Type 2 diabetes mellitus without complication, with long-term current use of insulin [facility])   [place], [place] L, RPH-CPP   1 year ago Type 2 diabetes mellitus without complication, with long-term current use of insulin [facility])   [place], Stephen L, RPH-CPP       Future Appointments             In 1 month Daisy Blossom, Jarome Matin, [place]   In 2 months Oletta Lamas, Milford Cage, NP [place]

## 2022-06-06 LAB — CMP14+EGFR
ALT: 29 IU/L (ref 0–32)
AST: 23 IU/L (ref 0–40)
Albumin/Globulin Ratio: 1.6 (ref 1.2–2.2)
Albumin: 4.4 g/dL (ref 3.8–4.9)
Alkaline Phosphatase: 135 IU/L — ABNORMAL HIGH (ref 44–121)
BUN/Creatinine Ratio: 14 (ref 9–23)
BUN: 10 mg/dL (ref 6–24)
Bilirubin Total: 0.3 mg/dL (ref 0.0–1.2)
CO2: 21 mmol/L (ref 20–29)
Calcium: 10.1 mg/dL (ref 8.7–10.2)
Chloride: 107 mmol/L — ABNORMAL HIGH (ref 96–106)
Creatinine, Ser: 0.71 mg/dL (ref 0.57–1.00)
Globulin, Total: 2.8 g/dL (ref 1.5–4.5)
Glucose: 122 mg/dL — ABNORMAL HIGH (ref 70–99)
Potassium: 4.1 mmol/L (ref 3.5–5.2)
Sodium: 144 mmol/L (ref 134–144)
Total Protein: 7.2 g/dL (ref 6.0–8.5)
eGFR: 101 mL/min/{1.73_m2} (ref >59)

## 2022-06-06 LAB — MICROALBUMIN / CREATININE URINE RATIO

## 2022-06-06 LAB — LIPID PANEL
Chol/HDL Ratio: 4.3 ratio (ref 0.0–4.4)
Cholesterol, Total: 195 mg/dL (ref 100–199)
HDL: 45 mg/dL (ref >39)
LDL Chol Calc (NIH): 131 mg/dL — ABNORMAL HIGH (ref 0–99)
Triglycerides: 103 mg/dL (ref 0–149)
VLDL Cholesterol Cal: 19 mg/dL (ref 5–40)

## 2022-06-21 ENCOUNTER — Encounter (INDEPENDENT_AMBULATORY_CARE_PROVIDER_SITE_OTHER): Payer: Self-pay

## 2022-06-26 ENCOUNTER — Other Ambulatory Visit (INDEPENDENT_AMBULATORY_CARE_PROVIDER_SITE_OTHER): Payer: Self-pay | Admitting: Primary Care

## 2022-06-26 DIAGNOSIS — M2550 Pain in unspecified joint: Secondary | ICD-10-CM

## 2022-06-26 NOTE — Telephone Encounter (Signed)
Will forward to provider  

## 2022-06-27 ENCOUNTER — Other Ambulatory Visit: Payer: Self-pay

## 2022-06-29 ENCOUNTER — Other Ambulatory Visit (INDEPENDENT_AMBULATORY_CARE_PROVIDER_SITE_OTHER): Payer: Self-pay | Admitting: Primary Care

## 2022-06-29 DIAGNOSIS — E119 Type 2 diabetes mellitus without complications: Secondary | ICD-10-CM

## 2022-06-29 NOTE — Telephone Encounter (Signed)
Requested Prescriptions  Pending Prescriptions Disp Refills   metFORMIN (GLUCOPHAGE) 1000 MG tablet [Pharmacy Med Name: METFORMIN HCL 1000 MG ORAL TABLET] 60 tablet 0    Sig: TAKE ONE TABLET BY MOUTH TWICE DAILY (AM+BEDTIME)     Endocrinology:  Diabetes - Biguanides Failed - 06/29/2022 11:40 AM      Failed - HBA1C is between 0 and 7.9 and within 180 days    HbA1c, POC (controlled diabetic range)  Date Value Ref Range Status  05/12/2022 8.9 (A) 0.0 - 7.0 % Final         Failed - B12 Level in normal range and within 720 days    Vitamin B-12  Date Value Ref Range Status  08/01/2018 301 232 - 1,245 pg/mL Final         Passed - Cr in normal range and within 360 days    Creatinine, Ser  Date Value Ref Range Status  05/29/2022 0.71 0.57 - 1.00 mg/dL Final         Passed - eGFR in normal range and within 360 days    GFR calc Af Amer  Date Value Ref Range Status  12/07/2019 >60 >60 mL/min Final   GFR, Estimated  Date Value Ref Range Status  08/10/2021 >60 >60 mL/min Final    Comment:    (NOTE) Calculated using the CKD-EPI Creatinine Equation (2021)    eGFR  Date Value Ref Range Status  05/29/2022 101 >59 mL/min/1.73 Final         Passed - Valid encounter within last 6 months    Recent Outpatient Visits           1 month ago Type 2 diabetes mellitus without complication, with long-term current use of insulin ([place])   [place], [place] L, RPH-CPP   1 month ago Type 2 diabetes mellitus without complication, without long-term current use of insulin ([place])   [facility] Kerin Perna, NP   9 months ago Vaginal irritation   [place] [facility] Kerin Perna, NP   9 months ago Type 2 diabetes mellitus without complication, with long-term current use of insulin [facility])   [place], [place] L, RPH-CPP   1 year ago Type 2  diabetes mellitus without complication, with long-term current use of insulin [facility])   [place], Stephen L, RPH-CPP       Future Appointments             Tomorrow Daisy Blossom, Jarome Matin, RPH-CPP [place]   In 1 month [place], Milford Cage, NP [place] [facility]            Passed - CBC within normal limits and completed in the last 12 months    WBC  Date Value Ref Range Status  08/10/2021 13.1 (H) 4.0 - 10.5 K/uL Final   RBC  Date Value Ref Range Status  08/10/2021 5.78 (H) 3.87 - 5.11 MIL/uL Final   Hemoglobin  Date Value Ref Range Status  08/10/2021 14.6 12.0 - 15.0 g/dL Final  01/07/2021 15.2 11.1 - 15.9 g/dL Final   HCT  Date Value Ref Range Status  08/10/2021 43.3 36.0 - 46.0 % Final   Hematocrit  Date Value Ref Range Status  01/07/2021 46.1 34.0 - 46.6 % Final   MCHC  Date Value Ref Range  Status  08/10/2021 33.7 30.0 - 36.0 g/dL Final   [facility]  Date Value Ref Range Status  08/10/2021 25.3 (L) 26.0 - 34.0 pg Final   MCV  Date Value Ref Range Status  08/10/2021 74.9 (L) 80.0 - 100.0 fL Final  01/07/2021 76 (L) 79 - 97 fL Final   No results found for: "PLTCOUNTKUC", "LABPLAT", "POCPLA" RDW  Date Value Ref Range Status  08/10/2021 14.6 11.5 - 15.5 % Final  01/07/2021 14.2 11.7 - 15.4 % Final

## 2022-06-30 ENCOUNTER — Telehealth: Payer: Self-pay | Admitting: Pharmacist

## 2022-06-30 ENCOUNTER — Ambulatory Visit: Payer: Medicaid Other | Attending: Primary Care | Admitting: Pharmacist

## 2022-06-30 ENCOUNTER — Encounter: Payer: Self-pay | Admitting: Pharmacist

## 2022-06-30 DIAGNOSIS — E119 Type 2 diabetes mellitus without complications: Secondary | ICD-10-CM | POA: Diagnosis not present

## 2022-06-30 DIAGNOSIS — Z794 Long term (current) use of insulin: Secondary | ICD-10-CM

## 2022-06-30 MED ORDER — FREESTYLE LIBRE 2 SENSOR MISC: 3 refills | Status: DC

## 2022-06-30 MED ORDER — FREESTYLE LIBRE 2 READER DEVI: 0 refills | Status: AC

## 2022-06-30 MED ORDER — SEMAGLUTIDE (1 MG/DOSE) 4 MG/3ML ~~LOC~~ SOPN: 1.0000 mg | PEN_INJECTOR | SUBCUTANEOUS | 2 refills | Status: DC

## 2022-06-30 NOTE — Telephone Encounter (Signed)
Can we start a PA for this patient's Elenor Legato supplies?

## 2022-06-30 NOTE — Progress Notes (Signed)
    S:     No chief complaint on file.  52 y.o. female who presents for diabetes evaluation, education, and management.  PMH is significant for migraines,T2DM, benign intracranial HTN, osteoarthritis.   Patient was last seen and referred by Juluis Mire on 05/12/22. Pharmacy saw her on 05/29/2022 and increased her Ozempic dose.   Today, patient arrives in good spirits and presents without any assistance. She is doing well. She is tolerating her Ozempic and is making improvements in her diet and exercise levels. She has no complaints today.   Family history: T2DM, HTN, renal disease Social History: 1-2 cigarettes daily   Current diabetes medications include: metformin 1000 mg BID, Ozempic 0.5 mg weekly, Lantus 60 units daily (still taking 70u daily) Current hyperlipidemia medications include: pravastatin 40 mg daily Patient reports adherence to taking all medications as prescribed.   Insurance coverage: [place] Medicaid  Patient denies hypoglycemic events.  Reported home blood sugars: not checking regularly.   Patient denies polyuria. Patient denies neuropathy (nerve pain). Patient denies visual changes. Patient reports self foot exams.   Patient reported dietary habits: Reports restricting sugars. East chicken, fish, and vegetables. Snacks on fruits. No processed.  Breakfast:  Lunch: wheat crackers, cheese, fruit Dinner: baking lean proteins or fish, fresh vegetables   Patient-reported exercise habits: endorses exercising daily 5 days weekly. Rides a stationary bike 30 minutes-60 minutes at a time for a distance 5.34 miles.  O:  ROS Physical Exam  Lab Results  Component Value Date   HGBA1C 8.9 (A) 05/12/2022   There were no vitals filed for this visit.  Lipid Panel     Component Value Date/Time   CHOL 195 05/29/2022 0918   TRIG 103 05/29/2022 0918   HDL 45 05/29/2022 0918   CHOLHDL 4.3 05/29/2022 0918   LDLCALC 131 (H) 05/29/2022 0918    Clinical  Atherosclerotic Cardiovascular Disease (ASCVD): No  The 10-year ASCVD risk score (Arnett DK, et al., 2019) is: 24.4%   Values used to calculate the score:     Age: 71 years     Sex: Female     Is Non-Hispanic African American: Yes     Diabetic: Yes     Tobacco smoker: Yes     Systolic Blood Pressure: Q000111Q mmHg     Is BP treated: Yes     HDL Cholesterol: 45 mg/dL     Total Cholesterol: 195 mg/dL   Patient is participating in a Managed Medicaid Plan:  Yes   A/P: Diabetes longstanding currently uncontrolled. Patient is able to verbalize appropriate hypoglycemia management plan. Medication adherence appears appropriate. -Decreased dose of basal insulin Lantus (insulin glargine) from 70 units to 62 units daily in the morning.  -Increased dose of GLP-1 Ozempic (semaglutide) from 0.5 mg to 1 mg.  -Continued metformin 1000 mg BID.  -Libre supplies sent.  -Patient educated on purpose, proper use, and potential adverse effects of Ozempic.  -Extensively discussed pathophysiology of diabetes, recommended lifestyle interventions, dietary effects on blood sugar control.  -Counseled on s/sx of and management of hypoglycemia.  -Next A1c anticipated 05/24.   Written patient instructions provided. Patient verbalized understanding of treatment plan.  Total time in face to face counseling 30 minutes.    Follow-up:  Pharmacist in 1 month. PCP clinic visit in 05/24.  Benard Halsted, PharmD, [place], [place] [phone]

## 2022-07-03 ENCOUNTER — Other Ambulatory Visit: Payer: Self-pay

## 2022-07-04 ENCOUNTER — Other Ambulatory Visit: Payer: Self-pay | Admitting: Family Medicine

## 2022-07-04 DIAGNOSIS — E119 Type 2 diabetes mellitus without complications: Secondary | ICD-10-CM

## 2022-07-07 ENCOUNTER — Encounter (HOSPITAL_COMMUNITY): Payer: Self-pay

## 2022-07-07 ENCOUNTER — Ambulatory Visit (HOSPITAL_COMMUNITY)
Admission: EM | Admit: 2022-07-07 | Discharge: 2022-07-07 | Disposition: A | Discharge status: Home / Self Care | Payer: Medicaid Other | Attending: Internal Medicine | Admitting: Internal Medicine

## 2022-07-07 DIAGNOSIS — K5900 Constipation, unspecified: Secondary | ICD-10-CM | POA: Insufficient documentation

## 2022-07-07 DIAGNOSIS — R110 Nausea: Secondary | ICD-10-CM

## 2022-07-07 DIAGNOSIS — Z794 Long term (current) use of insulin: Secondary | ICD-10-CM | POA: Insufficient documentation

## 2022-07-07 DIAGNOSIS — E1169 Type 2 diabetes mellitus with other specified complication: Secondary | ICD-10-CM | POA: Diagnosis not present

## 2022-07-07 DIAGNOSIS — E860 Dehydration: Secondary | ICD-10-CM | POA: Diagnosis not present

## 2022-07-07 LAB — POCT URINALYSIS DIPSTICK, ED / UC
Bilirubin Urine: NEGATIVE
Glucose, UA: 250 mg/dL — AB
Ketones, ur: NEGATIVE mg/dL
Leukocytes,Ua: NEGATIVE
Nitrite: NEGATIVE
Protein, ur: NEGATIVE mg/dL
Specific Gravity, Urine: >=1.03 (ref 1.005–1.030)
Urobilinogen, UA: 0.2 mg/dL (ref 0.0–1.0)
pH: 5 (ref 5.0–8.0)

## 2022-07-07 LAB — CBG MONITORING, ED: Glucose-Capillary: 118 mg/dL — ABNORMAL HIGH (ref 70–99)

## 2022-07-07 MED ORDER — POLYETHYLENE GLYCOL 3350 17 GM/SCOOP PO POWD: 1.0000 | Freq: Once | ORAL | 0 refills | Status: AC

## 2022-07-07 MED ORDER — ONDANSETRON 4 MG PO TBDP: 4.0000 mg | ORAL_TABLET | Freq: Three times a day (TID) | ORAL | 0 refills | Status: DC | PRN

## 2022-07-07 NOTE — ED Triage Notes (Signed)
Nausea, headache, and abdominal [pain. No emesis or diarrhea. Pain in the low back as well but no urinary symptoms. Onset 2 days. No known sick exposure.  Patient requesting STD testing.

## 2022-07-07 NOTE — ED Provider Notes (Signed)
[facility]    CSN: [id number] Arrival date & time: 07/07/22  1148      History   Chief Complaint Chief Complaint  Patient presents with   Nausea   Headache   Back Pain    Low back    HPI [name] is a 52 y.o. female.   Patient presents to urgent care for evaluation of nausea without vomiting, generalized headache, and low back pain/suprapubic abdominal pain that started a few days ago. She has a history of type 2 diabetes and currently takes metformin as well as Lantus daily.  She has not missed any dose of her medications and has not checked her blood sugar recently.  Reports sensation of incomplete bowel emptying, defecating once daily with normal stools that are soft/without blood/mucus in the stool.  She states her low back pain and suprapubic abdominal pain improved after bowel movement, however remains with sensation of incomplete bowel emptying.  Recently, she has changed her diet, increased exercise, and has had significant weight loss with effort. She has also recently quit cigarette smoking cold [place] without medical help. She denies body aches, vomiting, diarrhea, flank pain, urinary symptoms, vaginal symptoms, vision changes, dizziness, syncope, changes in medications, and acid reflux.  She is postmenopausal.  Reports decreased appetite over the last few weeks and states she has not been eating as much as usual.  Drinks mainly water without use of urinary irritants. No recent antibiotics or steroid use. Denies recent hypo/hyperglycemic episodes. She has been using tylenol and ibuprofen for low back pain without much relief.  No numbness or tingling to the bilateral lower extremities, saddle anesthesia symptoms, loss of bowel/bladder continence, photophobia, phonophobia, one-sided weakness, tinnitus, or recent falls.  She has been using over-the-counter stool softeners without much relief of abdominal pain as well as tylenol and ibuprofen without much relief.     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   Hot flashes 10/02/2018   Vaginal dryness 10/02/2018   BIH (benign intracranial hypertension) 11/06/2017   Somnolence, daytime 07/30/2017   Snoring 07/30/2017   Morbid obesity ([place]) 07/30/2017   Migraine 10/02/2016   Common migraine with intractable migraine 10/02/2016   Type 2 diabetes mellitus without complication, without long-term current use of insulin ([place]) 08/17/2016    Past Surgical History:  Procedure Laterality Date   CHOLECYSTECTOMY     TUBAL LIGATION      OB History     Gravida  5   Para  3   Term  3   Preterm      AB  2   Living  3      SAB  1   IAB  1   Ectopic      Multiple      Live Births               Home Medications    Prior to Admission medications   Medication Sig Start Date End Date Taking? Authorizing Provider  albuterol (VENTOLIN HFA) 108 (90  Base) MCG/ACT inhaler Inhale 1-2 puffs into the lungs every 6 (six) hours as needed for wheezing or shortness of breath. 11/05/20  Yes Covington, Sarah M, PA-C  amLODipine (NORVASC) 10 MG tablet TAKE 1 TABLET (10 MG TOTAL) BY MOUTH DAILY. (AM) 05/29/22  Yes Kerin Perna, NP  aspirin EC 81 MG tablet Take 81 mg by mouth every 6 (six) hours as needed for mild pain.    Yes [provider]  atorvastatin (LIPITOR) 40 MG tablet Take 1 tablet (40 mg total) by mouth daily. 02/15/21  Yes Charlott Rakes, MD  celecoxib (CELEBREX) 200 MG capsule TAKE ONE CAPSULE BY MOUTH TWICE  DAILY (AM+BEDTIME) 02/06/22  Yes Kerin Perna, NP  DULoxetine (CYMBALTA) 60 MG capsule TAKE 1 CAPSULE (60 MG TOTAL) BY MOUTH 2 (TWO) TIMES DAILY (AM+BEDTIME) 05/12/22  Yes Eulis Canner E, NP  fluticasone (FLONASE) 50 MCG/ACT nasal spray Place 2 sprays into both nostrils daily. 11/05/20  Yes Covington, Judson Roch M, PA-C  fluticasone (FLOVENT HFA) 44 MCG/ACT inhaler Inhale 2 puffs into the lungs 2 (two) times daily. 01/19/19  Yes Kerin Perna, NP  gabapentin (NEURONTIN) 300 MG capsule TAKE ONE CAPSULE BY MOUTH THREE TIMES DAILY ( AM+NOON+BEDTIME) 07/01/22  Yes Kerin Perna, NP  LANTUS SOLOSTAR 100 UNIT/ML Solostar Pen INJECT 60 UNITS INTO THE SKIN DAILY. 07/04/22  Yes Newlin, Charlane Ferretti, MD  losartan (COZAAR) 50 MG tablet TAKE 1 TABLET (50 MG TOTAL) BY MOUTH DAILY (AM) 05/29/22  Yes Kerin Perna, NP  metFORMIN (GLUCOPHAGE) 1000 MG tablet TAKE ONE TABLET BY MOUTH TWICE DAILY (AM+BEDTIME) 06/29/22  Yes Kerin Perna, NP  ondansetron (ZOFRAN-ODT) 4 MG disintegrating tablet Take 1 tablet (4 mg total) by mouth every 8 (eight) hours as needed for nausea or vomiting. 07/07/22  Yes Talbot Grumbling, FNP  pantoprazole (PROTONIX) 40 MG tablet TAKE 1 TABLET (40 MG TOTAL) BY MOUTH DAILY (AM) 02/06/22  Yes Kerin Perna, NP  polyethylene glycol powder (GLYCOLAX/MIRALAX) 17 GM/SCOOP powder Take 255 g by mouth once for 1 dose. 07/07/22 07/07/22 Yes Talbot Grumbling, FNP  pravastatin (PRAVACHOL) 40 MG tablet TAKE 1 TABLET (40 MG TOTAL) BY MOUTH DAILY (BEDTIME) 02/06/22  Yes Kerin Perna, NP  Semaglutide, 1 MG/DOSE, 4 MG/3ML SOPN Inject 1 mg as directed once a week. E11.9 06/30/22  Yes Charlott Rakes, MD  Accu-Chek Softclix Lancets lancets Use as instructed 12/29/19   Kerin Perna, NP  albuterol (VENTOLIN HFA) 108 (90 Base) MCG/ACT inhaler Inhale 1-2 puffs into the lungs every 6 (six) hours as needed for up to 14 days for wheezing or shortness of breath. 06/07/21 06/21/21   Leath-Warren, Alda Lea, NP  blood glucose meter kit and supplies Dispense based on patient and insurance preference. Use up to four times daily as directed. (FOR ICD-10 E10.9, E11.9). 01/05/20   Kerin Perna, NP  Continuous Blood Gluc Receiver (FREESTYLE LIBRE 2 READER) DEVI Use to check blood sugar continuously throughout the day. Change sensors once every 14 days. E11.9 06/30/22   Charlott Rakes, MD  Continuous Blood Gluc Sensor (FREESTYLE LIBRE 2 SENSOR) MISC Use to check blood sugar continuously throughout the day. Change sensors once every 14 days. E11.9 06/30/22   Charlott Rakes, MD  EASY COMFORT PEN NEEDLES 31G X 8 MM MISC USE TO INJECT LEVEMIR TWICE A DAY 10/26/21   Charlott Rakes, MD  fluconazole (DIFLUCAN) 150 MG tablet Take 1 tablet (150 mg total) by mouth every 3 (three) days. Patient not taking: Reported  on 05/12/2022 12/19/21   Chase Picket, MD  fluticasone (FLONASE) 50 MCG/ACT nasal spray Place 2 sprays into both nostrils daily for 14 days. 06/07/21 06/21/21  Leath-Warren, Alda Lea, NP  glucose blood (ACCU-CHEK GUIDE) test strip 1 each by Other route 3 (three) times daily after meals. Use as instructed 10/26/20   Kerin Perna, NP  HYDROcodone-acetaminophen (NORCO) 5-325 MG tablet Take 1 tablet by mouth every 4 (four) hours as needed for moderate pain. Patient not taking: Reported on [id number]   Delora Fuel, MD  Lancets (ACCU-CHEK SOFT [facility]) lancets Use as instructed 12/29/19   Kerin Perna, NP  lidocaine (XYLOCAINE) 2 % jelly Place 1 Application into the urethra as needed. 12/19/21   Lamptey, Myrene Galas, MD  metroNIDAZOLE (FLAGYL) 500 MG tablet Take 1 tablet (500 mg total) by mouth 2 (two) times daily. Patient not taking: Reported on 05/12/2022 12/19/21   Chase Picket, MD  nicotine (NICODERM CQ - DOSED IN MG/24 HR) 7 mg/24hr patch Place 1 patch (7 mg total) onto the skin daily. Remove before bedtime. 02/06/22   Bahraini, Sarah A  nicotine polacrilex  (NICORETTE) 2 MG gum Take 1 each (2 mg total) by mouth as needed for smoking cessation. Use every 1-2 hours as needed. 02/06/22   Bahraini, Sarah A  ondansetron (ZOFRAN) 4 MG tablet Take 1 tablet (4 mg total) by mouth every 8 (eight) hours as needed for nausea or vomiting. 02/15/21   Charlott Rakes, MD  traZODone (DESYREL) 100 MG tablet TAKE 1 TABLET (100 MG TOTAL) BY MOUTH AT BEDTIME. 05/29/22   Salley Slaughter, NP    Family History Family History  Problem Relation Age of Onset   Diabetes Mother    Hypertension Mother    Renal Disease Mother    Cancer Father    Diabetes Sister    Diabetes Brother    Renal Disease Brother    Diabetes Brother    Diabetes Sister    Diabetes Brother    Diabetes Brother    Diabetes Brother    Healthy Daughter    Healthy Daughter     Social History Social History   Tobacco Use   Smoking status: Every Day    Packs/day: .25    Types: Cigarettes   Smokeless tobacco: Never  Vaping Use   Vaping Use: Former  Substance Use Topics   Alcohol use: No   Drug use: No     Allergies   Lisinopril and Trulicity [dulaglutide]   Review of Systems Review of Systems  Musculoskeletal:  Positive for back pain.  Neurological:  Positive for headaches.  HPI   Physical Exam Triage Vital Signs ED Triage Vitals  Enc Vitals Group     BP 07/07/22 1246 116/73     Pulse Rate 07/07/22 1246 74     Resp 07/07/22 1246 18     Temp 07/07/22 1246 98.3 F (36.8 C)     Temp Source 07/07/22 1246 Oral     SpO2 07/07/22 1246 98 %     Weight 07/07/22 1246 234 lb (106.1 kg)     Height 07/07/22 1246 5' 5.5" (1.664 m)     Head Circumference --      Peak Flow --      Pain Score 07/07/22 1244 10     Pain Loc --      Pain Edu? --      Excl. in [place]? --    No data found.  Updated Vital Signs  BP 116/73 (BP Location: Right Arm)   Pulse 74   Temp 98.3 F (36.8 C) (Oral)   Resp 18   Ht 5' 5.5" (1.664 m)   Wt 234 lb (106.1 kg)   SpO2 98%   BMI 38.35 kg/m    Visual Acuity Right Eye Distance:   Left Eye Distance:   Bilateral Distance:    Right Eye Near:   Left Eye Near:    Bilateral Near:     Physical Exam Vitals and nursing note reviewed.  Constitutional:      Appearance: She is not ill-appearing or toxic-appearing.  HENT:     Head: Normocephalic and atraumatic.     Right Ear: Hearing and external ear normal.     Left Ear: Hearing and external ear normal.     Nose: Nose normal.     Mouth/Throat:     Lips: Pink.     Mouth: Mucous membranes are moist. No injury.     Tongue: No lesions. Tongue does not deviate from midline.     Palate: No mass and lesions.     Pharynx: Oropharynx is clear. Uvula midline. No pharyngeal swelling, oropharyngeal exudate, posterior oropharyngeal erythema or uvula swelling.     Tonsils: No tonsillar exudate or tonsillar abscesses.  Eyes:     General: Lids are normal. Vision grossly intact. Gaze aligned appropriately.     Extraocular Movements: Extraocular movements intact.     Conjunctiva/sclera: Conjunctivae normal.     Pupils: Pupils are equal, round, and reactive to light.     Comments: EOMs intact without pain or dizziness elicited.  Cardiovascular:     Rate and Rhythm: Normal rate and regular rhythm.     Heart sounds: Normal heart sounds, S1 normal and S2 normal.  Pulmonary:     Effort: Pulmonary effort is normal. No respiratory distress.     Breath sounds: Normal breath sounds and air entry.  Abdominal:     General: Bowel sounds are normal.     Palpations: Abdomen is soft.     Tenderness: There is no abdominal tenderness. There is no right CVA tenderness, left CVA tenderness or guarding.  Musculoskeletal:     Cervical back: Neck supple.  Lymphadenopathy:     Cervical: No cervical adenopathy.  Skin:    General: Skin is warm and dry.     Capillary Refill: Capillary refill takes less than 2 seconds.     Findings: No rash.  Neurological:     General: No focal deficit present.     Mental  Status: She is alert and oriented to person, place, and time. Mental status is at baseline.     Cranial Nerves: No dysarthria or facial asymmetry.  Psychiatric:        Mood and Affect: Mood normal.        Speech: Speech normal.        Behavior: Behavior normal.        Thought Content: Thought content normal.        Judgment: Judgment normal.      UC Treatments / Results  Labs (all labs ordered are listed, but only abnormal results are displayed) Labs Reviewed  POCT URINALYSIS DIPSTICK, ED / UC - Abnormal; Notable for the following components:      Result Value   Glucose, UA 250 (*)    Hgb urine dipstick TRACE (*)    All other components within normal limits  CBG MONITORING, ED - Abnormal; Notable for the following components:  Glucose-Capillary 118 (*)    All other components within normal limits  CERVICOVAGINAL ANCILLARY ONLY    EKG   Radiology No results found.  Procedures Procedures (including critical care time)  Medications Ordered in UC Medications - No data to display  Initial Impression / Assessment and Plan / UC Course  I have reviewed the triage vital signs and the nursing notes.  Pertinent labs & imaging results that were available during my care of the patient were reviewed by me and considered in my medical decision making (see chart for details).   1.  Dehydration, nausea without vomiting, type 2 diabetes, constipation Urinalysis shows elevated specific gravity indicating dehydration.  There is trace hemoglobin and 250 glucose to urinalysis as well.  CBG 118 in clinic.  Symptoms are likely due to dehydration and decreased oral intake.  Patient has been making significant positive changes to her lifestyle with increased exercise and cessation of smoking. Advised to continue on this path of lifestyle changes. Recommend intake of small more frequent meals to avoid spikes and dips in blood sugar.  Advised to drink at least 64 ounces of water per day and  increase this significantly should she exercise.   Patient was able to have a bowel movement in the clinic and states that her low back pain and abdominal pain improved after this.  She may use MiraLAX once daily as needed to allow for full evacuation of her bowels.  She may use Colace stool softener daily for maintenance.  Advised to continue increasing fiber in her diet.  May use Zofran 4 mg every 8 hours as needed for nausea. Recommend follow-up with PCP for ongoing evaluation of type 2 diabetes, nutrition, and weight loss.   Overall nontoxic in appearance with hemodynamically stable vital signs and non focal neuro exam.  Discussed physical exam and available lab work findings in clinic with patient.  Counseled patient regarding appropriate use of medications and potential side effects for all medications recommended or prescribed today. Discussed red flag signs and symptoms of worsening condition,when to call the PCP office, return to urgent care, and when to seek higher level of care in the emergency department. Patient verbalizes understanding and agreement with plan. All questions answered. Patient discharged in stable condition.    Final Clinical Impressions(s) / UC Diagnoses   Final diagnoses:  Dehydration  Nausea without vomiting  Type 2 diabetes mellitus with other specified complication, with long-term current use of insulin (HCC)  Constipation, unspecified constipation type     Discharge Instructions      Your urine shows that you are dehydrated. Increase your water intake to at least 64 ounces of water per day. You may also purchase electrolyte replacement drinks that do not have any sugar to help with rehydration. This will likely help with your headache and nausea as well.  You may take Zofran every 8 hours as needed for nausea.  Eat small frequent meals throughout the day to avoid spikes and dips in your blood sugar.  You may use MiraLAX once daily to help fully empty  your bowels.  I believe this is causing your low back pain and suprapubic abdominal pain as well.  Continue to exercise as this will greatly help with your type 2 diabetes.  Continue to watch her diet.  I am so proud of you for quitting smoking!  Keep up the great work!  Schedule a follow-up appointment with your primary care provider for ongoing evaluation.  If you develop any  new or worsening symptoms or do not improve in the next 2 to 3 days, please return.  If your symptoms are severe, please go to the emergency room.  Follow-up with your primary care provider for further evaluation and management of your symptoms as well as ongoing wellness visits.  I hope you feel better!     ED Prescriptions     Medication Sig Dispense Auth. Provider   ondansetron (ZOFRAN-ODT) 4 MG disintegrating tablet Take 1 tablet (4 mg total) by mouth every 8 (eight) hours as needed for nausea or vomiting. 20 tablet Talbot Grumbling, FNP   polyethylene glycol powder (GLYCOLAX/MIRALAX) 17 GM/SCOOP powder Take 255 g by mouth once for 1 dose. 255 g Talbot Grumbling, FNP      PDMP not reviewed this encounter.   Talbot Grumbling, [place] 07/07/22 [phone]

## 2022-07-07 NOTE — Discharge Instructions (Addendum)
Your urine shows that you are dehydrated. Increase your water intake to at least 64 ounces of water per day. You may also purchase electrolyte replacement drinks that do not have any sugar to help with rehydration. This will likely help with your headache and nausea as well.  You may take Zofran every 8 hours as needed for nausea.  Eat small frequent meals throughout the day to avoid spikes and dips in your blood sugar.  You may use MiraLAX once daily to help fully empty your bowels.  I believe this is causing your low back pain and suprapubic abdominal pain as well.  Continue to exercise as this will greatly help with your type 2 diabetes.  Continue to watch her diet.  I am so proud of you for quitting smoking!  Keep up the great work!  Schedule a follow-up appointment with your primary care provider for ongoing evaluation.  If you develop any new or worsening symptoms or do not improve in the next 2 to 3 days, please return.  If your symptoms are severe, please go to the emergency room.  Follow-up with your primary care provider for further evaluation and management of your symptoms as well as ongoing wellness visits.  I hope you feel better!

## 2022-07-10 ENCOUNTER — Telehealth (HOSPITAL_COMMUNITY): Payer: Self-pay | Admitting: Emergency Medicine

## 2022-07-10 LAB — CERVICOVAGINAL ANCILLARY ONLY
Bacterial Vaginitis (gardnerella): POSITIVE — AB
Candida Glabrata: NEGATIVE
Candida Vaginitis: NEGATIVE
Chlamydia: NEGATIVE
Comment: NEGATIVE
Comment: NEGATIVE
Comment: NEGATIVE
Comment: NEGATIVE
Comment: NEGATIVE
Comment: NORMAL
Neisseria Gonorrhea: NEGATIVE
Trichomonas: NEGATIVE

## 2022-07-10 MED ORDER — METRONIDAZOLE 500 MG PO TABS: 500.0000 mg | ORAL_TABLET | Freq: Two times a day (BID) | ORAL | 0 refills | Status: DC

## 2022-07-20 ENCOUNTER — Other Ambulatory Visit: Payer: Self-pay

## 2022-07-26 ENCOUNTER — Ambulatory Visit (INDEPENDENT_AMBULATORY_CARE_PROVIDER_SITE_OTHER): Payer: Self-pay

## 2022-07-26 ENCOUNTER — Other Ambulatory Visit (INDEPENDENT_AMBULATORY_CARE_PROVIDER_SITE_OTHER): Payer: Self-pay | Admitting: Primary Care

## 2022-07-26 ENCOUNTER — Telehealth (INDEPENDENT_AMBULATORY_CARE_PROVIDER_SITE_OTHER): Payer: Self-pay | Admitting: Primary Care

## 2022-07-26 MED ORDER — FLUTICASONE PROPIONATE 50 MCG/ACT NA SUSP: 2.0000 | Freq: Every day | NASAL | 2 refills | Status: DC

## 2022-07-26 NOTE — Telephone Encounter (Signed)
Will forward to provider  

## 2022-07-26 NOTE — Telephone Encounter (Signed)
  Chief Complaint: allergies  Symptoms: nasal congestion and itching watery eyes with swelling  Frequency: 2-3 days  Pertinent Negatives: Patient denies SOB or fever  Disposition: ED /[] Urgent Care (no appt availability in office) / Appointment(In office/virtual)/  [facility]/ Home Care/ Refused Recommended Disposition /[] [place] [facility]/  Follow-up with PCP Additional Notes: pt is requesting refill on Flonase to help with allergies. She states she had a small amount left and it started helping but ran out. Pt has been taking Allergy OTC as well but couldn't recall name. Pt is going out of town this evening and needing rx before 6pm since pharmacy closes. Advised would send to provider for request.   Summary: Allergies   Patient states that her allergies are bothering her and is requesting an rx of fluticasone (FLONASE) 50 MCG/ACT nasal spray be sent to [facility] - [place], [place]. Patient states that she is going out of town tonight and will need the medication called in before 6pm today. Please advise.     Reason for Disposition  Causes of nasal allergies (allergens), questions about  Answer Assessment - Initial Assessment Questions 1. SYMPTOM: "What's the main symptom you're concerned about?" (e.g., runny nose, stuffiness, sneezing, itching)     2-3 days  2. SEVERITY: "How bad is it?" "What does it keep you from doing?" (e.g., sleeping, working)      Feeling bad  3. EYES: "Are the eyes also red, watery, and itchy?"      yes 4. TRIGGER: "What pollen or other allergic substance do you think is causing the symptoms?"      Pollen  5. TREATMENT: "What medicine are you using?" "What medicine worked best in the past?"     Flonase 6. OTHER SYMPTOMS: "Do you have any other symptoms?" (e.g., coughing, difficulty breathing, wheezing)     Nasal congestion, eye swelling  Protocols used: Nasal Allergies (Hay Fever)-A-AH

## 2022-08-04 ENCOUNTER — Ambulatory Visit: Payer: Medicaid Other | Attending: Primary Care | Admitting: Pharmacist

## 2022-08-04 ENCOUNTER — Encounter (HOSPITAL_COMMUNITY): Payer: Self-pay | Admitting: Psychiatry

## 2022-08-04 ENCOUNTER — Telehealth (INDEPENDENT_AMBULATORY_CARE_PROVIDER_SITE_OTHER): Payer: Medicaid Other | Admitting: Psychiatry

## 2022-08-04 ENCOUNTER — Encounter: Payer: Self-pay | Admitting: Pharmacist

## 2022-08-04 DIAGNOSIS — F3341 Major depressive disorder, recurrent, in partial remission: Secondary | ICD-10-CM

## 2022-08-04 DIAGNOSIS — Z794 Long term (current) use of insulin: Secondary | ICD-10-CM

## 2022-08-04 DIAGNOSIS — E119 Type 2 diabetes mellitus without complications: Secondary | ICD-10-CM | POA: Diagnosis not present

## 2022-08-04 DIAGNOSIS — F4329 Adjustment disorder with other symptoms: Secondary | ICD-10-CM | POA: Diagnosis not present

## 2022-08-04 MED ORDER — LANTUS SOLOSTAR 100 UNIT/ML ~~LOC~~ SOPN: 56.0000 [IU] | PEN_INJECTOR | Freq: Every day | SUBCUTANEOUS | 1 refills | Status: DC

## 2022-08-04 MED ORDER — ACCU-CHEK SOFTCLIX LANCETS MISC: 6 refills | Status: DC

## 2022-08-04 MED ORDER — SEMAGLUTIDE (2 MG/DOSE) 8 MG/3ML ~~LOC~~ SOPN: 2.0000 mg | PEN_INJECTOR | SUBCUTANEOUS | 2 refills | Status: DC

## 2022-08-04 MED ORDER — DULOXETINE HCL 60 MG PO CPEP: ORAL_CAPSULE | ORAL | 3 refills | Status: DC

## 2022-08-04 MED ORDER — TRAZODONE HCL 100 MG PO TABS: 100.0000 mg | ORAL_TABLET | Freq: Every day | ORAL | 3 refills | Status: DC

## 2022-08-04 MED ORDER — ACCU-CHEK GUIDE VI STRP: ORAL_STRIP | 6 refills | Status: DC

## 2022-08-04 MED ORDER — ACCU-CHEK GUIDE W/DEVICE KIT: PACK | 0 refills | Status: DC

## 2022-08-04 NOTE — Progress Notes (Signed)
S:     No chief complaint on file.  52 y.o. female who presents for diabetes evaluation, education, and management.  PMH is significant for migraines,T2DM, benign intracranial HTN, osteoarthritis.   Patient was last seen and referred by Gwinda Passe on 05/12/22. Pharmacy saw her on 06/30/2022 and increased her Ozempic dose. We also decreased Lantus.   Today, patient arrives in good spirits and presents without any assistance. She is doing well. She is tolerating her Ozempic and is making improvements in her diet and exercise levels. She has no complaints today.   Family history: T2DM, HTN, renal disease Social History: 1-2 cigarettes daily   Current diabetes medications include: metformin 1000 mg BID, Ozempic 1 mg weekly, Lantus 60 units daily Current hyperlipidemia medications include: pravastatin 40 mg daily Patient reports adherence to taking all medications as prescribed.   Insurance coverage: [employer]  Patient denies hypoglycemic events.  Reported home blood sugars: not checking regularly.   Patient denies polyuria. Patient denies neuropathy (nerve pain). Patient denies visual changes. Patient reports self foot exams.   Patient reported dietary habits: Reports restricting sugars. East chicken, fish, and vegetables. Snacks on fruits. No processed.  Breakfast:  Lunch: wheat crackers, cheese, fruit Dinner: baking lean proteins or fish, fresh vegetables   Patient-reported exercise habits: endorses exercising daily 5 days weekly. Rides a stationary bike 30 minutes-60 minutes at a time for a distance 5.34 miles.  O:  No checking blood sugar at home. She was approved for the [place] system is plans to place this with her community pharmacist later today.   Lab Results  Component Value Date   HGBA1C 8.9 (A) 05/12/2022   There were no vitals filed for this visit.  Lipid Panel     Component Value Date/Time   CHOL 195 05/29/2022 0918   TRIG 103 05/29/2022 0918    HDL 45 05/29/2022 0918   CHOLHDL 4.3 05/29/2022 0918   LDLCALC 131 (H) 05/29/2022 0918    Clinical Atherosclerotic Cardiovascular Disease (ASCVD): No  The 10-year ASCVD risk score (Arnett DK, et al., 2019) is: 17.4%   Values used to calculate the score:     Age: 19 years     Sex: Female     Is Non-Hispanic African American: Yes     Diabetic: Yes     Tobacco smoker: Yes     Systolic Blood Pressure: 116 mmHg     Is BP treated: Yes     HDL Cholesterol: 45 mg/dL     Total Cholesterol: 195 mg/dL   Patient is participating in a Managed Medicaid Plan:  Yes   A/P: Diabetes longstanding currently uncontrolled. Patient is able to verbalize appropriate hypoglycemia management plan. Medication adherence appears appropriate. -Decreased dose of basal insulin Lantus (insulin glargine) from 60 units to 56 units daily in the morning.  -Increased dose of GLP-1 Ozempic (semaglutide) from 1 mg to 2 mg.  -Continued metformin 1000 mg BID.  -Patient educated on purpose, proper use, and potential adverse effects of Ozempic.  -Extensively discussed pathophysiology of diabetes, recommended lifestyle interventions, dietary effects on blood sugar control.  -Counseled on s/sx of and management of hypoglycemia.  -Next A1c anticipated 05/24.   Written patient instructions provided. Patient verbalized understanding of treatment plan.  Total time in face to face counseling 30 minutes.    Follow-up:  Pharmacist prn. PCP clinic visit in 2-3 weeks for A1c check.   Butch Penny, PharmD, BCACP, CPP Clinical Pharmacist [facility]  [phone]  

## 2022-08-04 NOTE — Progress Notes (Signed)
BH MD/PA/NP OP Progress Note Virtual Visit via Video Note  I connected with [name] on 08/04/22 at 10:00 AM EDT by a video enabled telemedicine application and verified that I am speaking with the correct person using two identifiers.  Location: Patient: Home Provider: Clinic   I discussed the limitations of evaluation and management by telemedicine and the availability of in person appointments. The patient expressed understanding and agreed to proceed.  I provided 30 minutes of non-face-to-face time during this encounter.    08/04/2022 10:19 AM Destina Mantei  MRN:  [id number]  Chief Complaint: "My grandkids keep me busy"  HPI: 52 year old female seen today for follow-up psychiatric evaluation.   She has a psychiatric history of depression and prolonged grief.  Currently she is managed on trazodone 100 mg nightly and Cymbalta 60 mg twice daily.  She notes her medications are effective in managing her psychiatric conditions.  Today patient was well-groomed, pleasant, cooperative, and engaged in conversation.  She inform her that her grandchildren keep her busy.  She notes that because of them she feels mentally stable.  She informed Clinical research associate that she and her daughter will be taking her grandkids to the beach this weekend.  Patient notes that she has been more active.  She informed Clinical research associate that she goes to the gym with her daughter a few days out of the week and rides the bicycle.  She also notes that she has stopped smoking.  Patient notes that she has lost a total of 5 pounds but has gained a few back since her last visit.  She informed Clinical research associate that her anxiety and depression continues to be well-managed.  Today provider conducted GAD-7 and patient scored a 9, at her last visit she scored a 6.  Patient reports that she is concerned about finding a new home.  She notes that she dislikes the condition of her current home.  Provider also conducted a PHQ-9 and patient 3, at her last visit she  scored a 10.  She endorses adequate sleep and appetite.  Today she denies SI/HI/AVH, mania, or paranoia.    Patient informed writer that she continues to have leg and feet pain which she quantifies as 6 out of 10.  She informed Clinical research associate that she takes aspirin, BC powder, and cortisone injection for her pain.    Today no medication changes made.  She will continue medications as prescribed.  No other concerns at this time.   Visit Diagnosis:    ICD-10-CM   1. MDD (major depressive disorder), recurrent, in partial remission (HCC)  F33.41 DULoxetine (CYMBALTA) 60 MG capsule    traZODone (DESYREL) 100 MG tablet    2. 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 DULoxetine (CYMBALTA) 60 MG capsule      Past Psychiatric History:  MDD, grief with prolonged bereavement reaction  Past Medical History:  Past Medical History:  Diagnosis Date   Anxiety    Common migraine with intractable migraine 10/02/2016   Depression    Diabetes mellitus    Hypertension     Past Surgical History:  Procedure Laterality Date   CHOLECYSTECTOMY     TUBAL LIGATION      Family Psychiatric History: Denied any family psychiatric history  Family History:  Family History  Problem Relation Age of Onset   Diabetes Mother    Hypertension Mother    Renal Disease Mother    Cancer Father    Diabetes Sister    Diabetes Brother    Renal Disease  Brother    Diabetes Brother    Diabetes Sister    Diabetes Brother    Diabetes Brother    Diabetes Brother    Healthy Daughter    Healthy Daughter     Social History:  Social History   Socioeconomic History   Marital status: Single    Spouse name: Not on file   Number of children: Not on file   Years of education: Not on file   Highest education level: Not on file  Occupational History   Not on file  Tobacco Use   Smoking status: Every Day    Packs/day: .25    Types: Cigarettes   Smokeless tobacco: Never  Vaping Use   Vaping Use: Former  Substance  and Sexual Activity   Alcohol use: No   Drug use: No   Sexual activity: Not on file  Other Topics Concern   Not on file  Social History Narrative   Right handed    Lives with daughter   Caffeine use: Drinks coffee/tea/soda sometimes   Social Determinants of Health   Financial Resource Strain: Low Risk  (06/30/2022)   Overall Financial Resource Strain (CARDIA)    Difficulty of Paying Living Expenses: Not hard at all  Food Insecurity: No Food Insecurity (06/30/2022)   Hunger Vital Sign    Worried About Running Out of Food in the Last Year: Never true    Ran Out of Food in the Last Year: Never true  Transportation Needs: No Transportation Needs (06/30/2022)   PRAPARE - Administrator, Civil Service (Medical): No    Lack of Transportation (Non-Medical): No  Physical Activity: Sufficiently Active (06/30/2022)   Exercise Vital Sign    Days of Exercise per Week: 5 days    Minutes of Exercise per Session: 30 min  Stress: No Stress Concern Present (06/30/2022)   [employer] of Occupational Health - Occupational Stress Questionnaire    Feeling of Stress : Not at all  Social Connections: Socially Integrated (06/30/2022)   Social Connection and Isolation Panel [NHANES]    Frequency of Communication with Friends and Family: More than three times a week    Frequency of Social Gatherings with Friends and Family: More than three times a week    Attends Religious Services: 1 to 4 times per year    Active Member of [employer] or Organizations: Yes    Attends Banker Meetings: 1 to 4 times per year    Marital Status: Living with partner    Allergies:  Allergies  Allergen Reactions   Lisinopril     angioedema   Trulicity [Dulaglutide] Other (See Comments)    Gas, abdominal bloating    Metabolic Disorder Labs: Lab Results  Component Value Date   HGBA1C 8.9 (A) 05/12/2022   No results found for: "PROLACTIN" Lab Results  Component Value Date   CHOL 195 05/29/2022    TRIG 103 05/29/2022   HDL 45 05/29/2022   CHOLHDL 4.3 05/29/2022   LDLCALC 131 (H) 05/29/2022   LDLCALC 158 (H) 01/07/2021   Lab Results  Component Value Date   TSH 2.870 10/02/2018   TSH 4.840 (H) 01/09/2018    Therapeutic Level Labs: No results found for: "LITHIUM" No results found for: "VALPROATE" No results found for: "CBMZ"  Current Medications: Current Outpatient Medications  Medication Sig Dispense Refill   Accu-Chek Softclix Lancets lancets Use to check blood sugar three times daily. 100 each 6   albuterol (VENTOLIN HFA) 108 (90  Base) MCG/ACT inhaler Inhale 1-2 puffs into the lungs every 6 (six) hours as needed for wheezing or shortness of breath. 8 each 0   albuterol (VENTOLIN HFA) 108 (90 Base) MCG/ACT inhaler Inhale 1-2 puffs into the lungs every 6 (six) hours as needed for up to 14 days for wheezing or shortness of breath. 1 each 0   amLODipine (NORVASC) 10 MG tablet TAKE 1 TABLET (10 MG TOTAL) BY MOUTH DAILY. (AM) 90 tablet 0   aspirin EC 81 MG tablet Take 81 mg by mouth every 6 (six) hours as needed for mild pain.      atorvastatin (LIPITOR) 40 MG tablet Take 1 tablet (40 mg total) by mouth daily. 30 tablet 2   blood glucose meter kit and supplies Dispense based on patient and insurance preference. Use up to four times daily as directed. (FOR ICD-10 E10.9, E11.9). 1 each 0   Blood Glucose Monitoring Suppl (ACCU-CHEK GUIDE) w/Device KIT Use to check blood sugar three times daily. 1 kit 0   celecoxib (CELEBREX) 200 MG capsule TAKE ONE CAPSULE BY MOUTH TWICE DAILY (AM+BEDTIME) 180 capsule 1   Continuous Blood Gluc Receiver (FREESTYLE LIBRE 2 READER) DEVI Use to check blood sugar continuously throughout the day. Change sensors once every 14 days. E11.9 1 each 0   Continuous Blood Gluc Sensor (FREESTYLE LIBRE 2 SENSOR) MISC Use to check blood sugar continuously throughout the day. Change sensors once every 14 days. E11.9 2 each 3   DULoxetine (CYMBALTA) 60 MG capsule TAKE 1  CAPSULE (60 MG TOTAL) BY MOUTH 2 (TWO) TIMES DAILY (AM+BEDTIME) 90 capsule 3   EASY COMFORT PEN NEEDLES 31G X 8 MM MISC USE TO INJECT LEVEMIR TWICE A DAY 100 each 2   fluconazole (DIFLUCAN) 150 MG tablet Take 1 tablet (150 mg total) by mouth every 3 (three) days. (Patient not taking: Reported on 05/12/2022) 2 tablet 0   fluticasone (FLONASE) 50 MCG/ACT nasal spray Place 2 sprays into both nostrils daily. 16 g 2   fluticasone (FLOVENT HFA) 44 MCG/ACT inhaler Inhale 2 puffs into the lungs 2 (two) times daily. 1 Inhaler 12   gabapentin (NEURONTIN) 300 MG capsule TAKE ONE CAPSULE BY MOUTH THREE TIMES DAILY ( AM+NOON+BEDTIME) 90 capsule 2   glucose blood (ACCU-CHEK GUIDE) test strip Use to check blood sugar three times daily. 100 each 6   HYDROcodone-acetaminophen (NORCO) 5-325 MG tablet Take 1 tablet by mouth every 4 (four) hours as needed for moderate pain. (Patient not taking: Reported on 05/12/2022) 10 tablet 0   insulin glargine (LANTUS SOLOSTAR) 100 UNIT/ML Solostar Pen Inject 56 Units into the skin daily. 45 mL 1   lidocaine (XYLOCAINE) 2 % jelly Place 1 Application into the urethra as needed. 30 mL 0   losartan (COZAAR) 50 MG tablet TAKE 1 TABLET (50 MG TOTAL) BY MOUTH DAILY (AM) 90 tablet 0   metFORMIN (GLUCOPHAGE) 1000 MG tablet TAKE ONE TABLET BY MOUTH TWICE DAILY (AM+BEDTIME) 60 tablet 0   metroNIDAZOLE (FLAGYL) 500 MG tablet Take 1 tablet (500 mg total) by mouth 2 (two) times daily. 14 tablet 0   nicotine (NICODERM CQ - DOSED IN MG/24 HR) 7 mg/24hr patch Place 1 patch (7 mg total) onto the skin daily. Remove before bedtime. 28 patch 5   nicotine polacrilex (NICORETTE) 2 MG gum Take 1 each (2 mg total) by mouth as needed for smoking cessation. Use every 1-2 hours as needed. 100 tablet 5   ondansetron (ZOFRAN) 4 MG tablet Take 1 tablet (  4 mg total) by mouth every 8 (eight) hours as needed for nausea or vomiting. 20 tablet 0   ondansetron (ZOFRAN-ODT) 4 MG disintegrating tablet Take 1 tablet (4 mg  total) by mouth every 8 (eight) hours as needed for nausea or vomiting. 20 tablet 0   pantoprazole (PROTONIX) 40 MG tablet TAKE 1 TABLET (40 MG TOTAL) BY MOUTH DAILY (AM) 30 tablet 3   pravastatin (PRAVACHOL) 40 MG tablet TAKE 1 TABLET (40 MG TOTAL) BY MOUTH DAILY (BEDTIME) 90 tablet 1   Semaglutide, 2 MG/DOSE, 8 MG/3ML SOPN Inject 2 mg as directed once a week. 9 mL 2   traZODone (DESYREL) 100 MG tablet Take 1 tablet (100 mg total) by mouth at bedtime. 30 tablet 3   No current facility-administered medications for this visit.     Musculoskeletal: Strength & Muscle Tone: within normal limits and telehealth visit Gait & Station: normal, telehealth visit Patient leans: N/A  Psychiatric Specialty Exam: Review of Systems  There were no vitals taken for this visit.There is no height or weight on file to calculate BMI.  General Appearance: Well Groomed  Eye Contact:  Good  Speech:  Clear and Coherent and Normal Rate  Volume:  Normal  Mood:  Euthymic  Affect:  Appropriate and Congruent  Thought Process:  Coherent, Goal Directed, and Linear  Orientation:  Full (Time, Place, and Person)  Thought Content: WDL and Logical   Suicidal Thoughts:  No  Homicidal Thoughts:  No  Memory:  Immediate;   Good Recent;   Good Remote;   Good  Judgement:  Good  Insight:  Good  Psychomotor Activity:  Normal  Concentration:  Concentration: Good and Attention Span: Good  Recall:  Good  Fund of Knowledge: Good  Language: Good  Akathisia:  No  Handed:  Right  AIMS (if indicated): not done  Assets:  Communication Skills Desire for Improvement Financial Resources/Insurance Housing Leisure Time Physical Health Social Support  ADL's:  Intact  Cognition: WNL  Sleep:  Good   Screenings: GAD-7    Flowsheet Row Video Visit from 08/04/2022 in [facility] Video Visit from 05/12/2022 in [facility] Video Visit from 10/26/2021 in [facility] Video Visit from 05/19/2021 in [facility] Video Visit from 02/23/2021 in [facility]  Total GAD-7 Score 9 6 9 18 11       PHQ2-9    Flowsheet Row Video Visit from 08/04/2022 in [facility] Office Visit from 06/30/2022 in [facility] Video Visit from 05/12/2022 in [facility] Video Visit from 10/26/2021 in [facility] Video Visit from 05/19/2021 in [facility]  PHQ-2 Total Score 0 3 3 0 6  PHQ-9 Total Score 3 10 10 6 17       Flowsheet Row ED from 07/07/2022 in [facility] ED from 12/19/2021 in [facility] ED from 08/10/2021 in [facility] Emergency Department at [facility]  C-SSRS RISK CATEGORY No Risk No Risk No Risk        Assessment and Plan: Patient notes that she is doing well on her current medication regimen.  No medication changes made today.    1. MDD (major depressive disorder), recurrent, in partial remission (HCC)  Continue- DULoxetine (CYMBALTA) 60 MG capsule; Take 1 capsule (60 mg total) by mouth 2 (  two) times daily.  Dispense: 90 capsule; Refill: 3 Continue- traZODone (DESYREL) 100 MG tablet; Take 1 tablet (100 mg total) by mouth at bedtime.  Dispense: 30 tablet; Refill: 3  2. Grief reaction with prolonged bereavement  Continue- DULoxetine (CYMBALTA) 60 MG capsule; Take 1 capsule (60 mg total) by mouth 2 (two) times daily.  Dispense: 90 capsule; Refill: 3   Follow-up in 2 months Shanna Cisco, NP 08/04/2022, 10:19 AM

## 2022-08-14 ENCOUNTER — Other Ambulatory Visit (INDEPENDENT_AMBULATORY_CARE_PROVIDER_SITE_OTHER): Payer: Self-pay | Admitting: Primary Care

## 2022-08-14 DIAGNOSIS — E119 Type 2 diabetes mellitus without complications: Secondary | ICD-10-CM

## 2022-08-14 DIAGNOSIS — K219 Gastro-esophageal reflux disease without esophagitis: Secondary | ICD-10-CM

## 2022-08-18 ENCOUNTER — Ambulatory Visit (INDEPENDENT_AMBULATORY_CARE_PROVIDER_SITE_OTHER): Payer: Medicaid Other | Admitting: Primary Care

## 2022-09-12 ENCOUNTER — Ambulatory Visit (INDEPENDENT_AMBULATORY_CARE_PROVIDER_SITE_OTHER): Payer: Medicaid Other | Admitting: Primary Care

## 2022-09-28 ENCOUNTER — Encounter (INDEPENDENT_AMBULATORY_CARE_PROVIDER_SITE_OTHER): Payer: Self-pay | Admitting: Primary Care

## 2022-09-28 ENCOUNTER — Ambulatory Visit (INDEPENDENT_AMBULATORY_CARE_PROVIDER_SITE_OTHER): Payer: Medicaid Other | Admitting: Primary Care

## 2022-09-28 VITALS — BP 137/80 | HR 94 | Resp 16 | Wt 230.8 lb

## 2022-09-28 DIAGNOSIS — Z794 Long term (current) use of insulin: Secondary | ICD-10-CM

## 2022-09-28 DIAGNOSIS — E119 Type 2 diabetes mellitus without complications: Secondary | ICD-10-CM

## 2022-09-28 DIAGNOSIS — Z7984 Long term (current) use of oral hypoglycemic drugs: Secondary | ICD-10-CM

## 2022-09-28 DIAGNOSIS — I10 Essential (primary) hypertension: Secondary | ICD-10-CM

## 2022-09-28 LAB — POCT GLYCOSYLATED HEMOGLOBIN (HGB A1C): HbA1c, POC (controlled diabetic range): 7.1 % — AB (ref 0.0–7.0)

## 2022-09-28 MED ORDER — LANTUS SOLOSTAR 100 UNIT/ML ~~LOC~~ SOPN: 56.0000 [IU] | PEN_INJECTOR | Freq: Every day | SUBCUTANEOUS | 1 refills | Status: DC

## 2022-09-28 MED ORDER — LOSARTAN POTASSIUM 50 MG PO TABS: ORAL_TABLET | ORAL | 1 refills | Status: DC

## 2022-10-02 ENCOUNTER — Other Ambulatory Visit (INDEPENDENT_AMBULATORY_CARE_PROVIDER_SITE_OTHER): Payer: Self-pay | Admitting: Primary Care

## 2022-10-02 DIAGNOSIS — E119 Type 2 diabetes mellitus without complications: Secondary | ICD-10-CM

## 2022-10-03 ENCOUNTER — Encounter (INDEPENDENT_AMBULATORY_CARE_PROVIDER_SITE_OTHER): Payer: Self-pay | Admitting: Primary Care

## 2022-10-03 NOTE — Progress Notes (Signed)
[name], is a 52 y.o. female  VZD:[id number]  IRJ:[id number]  DOB - [birth year]  Chief Complaint  Patient presents with   Diabetes       Subjective:   [name] is a 52 y.o. female here today for a follow up visit for HTN and DM. Patient has No headache, No chest pain, No abdominal pain - No Nausea, No new weakness tingling or numbness, No Cough - shortness of breath. Denies polyuria, polydipsia, polyphasia or vision changes.  Does not check blood sugars at home.   No problems updated.  Allergies  Allergen Reactions   Lisinopril     angioedema   Trulicity [Dulaglutide] Other (See Comments)    Gas, abdominal bloating    Past Medical History:  Diagnosis Date   Anxiety    Common migraine with intractable migraine 10/02/2016   Depression    Diabetes mellitus    Hypertension     Current Outpatient Medications on File Prior to Visit  Medication Sig Dispense Refill   Accu-Chek Softclix Lancets lancets Use to check blood sugar three times daily. 100 each 6   albuterol (VENTOLIN HFA) 108 (90 Base) MCG/ACT inhaler Inhale 1-2 puffs into the lungs every 6 (six) hours as needed for wheezing or shortness of breath. 8 each 0   albuterol (VENTOLIN HFA) 108 (90 Base) MCG/ACT inhaler Inhale 1-2 puffs into the lungs every 6 (six) hours as needed for up to 14 days for wheezing or shortness of breath. 1 each 0   amLODipine (NORVASC) 10 MG tablet TAKE 1 TABLET (10 MG TOTAL) BY MOUTH DAILY. (AM) 90 tablet 0   aspirin EC 81 MG tablet Take 81 mg by mouth every 6 (six) hours as needed for mild pain.      atorvastatin (LIPITOR) 40 MG tablet Take 1 tablet (40 mg total) by mouth daily. 30 tablet 2   blood glucose meter kit and supplies Dispense based on patient and insurance preference. Use up to four times daily as directed. (FOR ICD-10 E10.9, E11.9). 1 each 0   Blood Glucose Monitoring Suppl (ACCU-CHEK GUIDE) w/Device KIT Use to check blood sugar three times  daily. 1 kit 0   celecoxib (CELEBREX) 200 MG capsule TAKE ONE CAPSULE BY MOUTH TWICE DAILY (AM+BEDTIME) 180 capsule 1   Continuous Blood Gluc Receiver (FREESTYLE LIBRE 2 READER) DEVI Use to check blood sugar continuously throughout the day. Change sensors once every 14 days. E11.9 1 each 0   Continuous Blood Gluc Sensor (FREESTYLE LIBRE 2 SENSOR) MISC Use to check blood sugar continuously throughout the day. Change sensors once every 14 days. E11.9 2 each 3   DULoxetine (CYMBALTA) 60 MG capsule TAKE 1 CAPSULE (60 MG TOTAL) BY MOUTH 2 (TWO) TIMES DAILY (AM+BEDTIME) 90 capsule 3   EASY COMFORT PEN NEEDLES 31G X 8 MM MISC USE TO INJECT LEVEMIR TWICE A DAY 100 each 2   fluconazole (DIFLUCAN) 150 MG tablet Take 1 tablet (150 mg total) by mouth every 3 (three) days. (Patient not taking: Reported on 05/12/2022) 2 tablet 0   fluticasone (FLONASE) 50 MCG/ACT nasal spray Place 2 sprays into both nostrils daily. 16 g 2   fluticasone (FLOVENT HFA) 44 MCG/ACT inhaler Inhale 2 puffs into the lungs 2 (two) times daily. 1 Inhaler 12   glucose blood (ACCU-CHEK GUIDE) test strip Use to check blood sugar three times daily. 100 each 6   HYDROcodone-acetaminophen (NORCO) 5-325 MG tablet Take 1 tablet by mouth every 4 (four) hours  as needed for moderate pain. (Patient not taking: Reported on 05/12/2022) 10 tablet 0   lidocaine (XYLOCAINE) 2 % jelly Place 1 Application into the urethra as needed. 30 mL 0   metroNIDAZOLE (FLAGYL) 500 MG tablet Take 1 tablet (500 mg total) by mouth 2 (two) times daily. (Patient not taking: Reported on 09/28/2022) 14 tablet 0   nicotine (NICODERM CQ - DOSED IN MG/24 HR) 7 mg/24hr patch Place 1 patch (7 mg total) onto the skin daily. Remove before bedtime. 28 patch 5   nicotine polacrilex (NICORETTE) 2 MG gum Take 1 each (2 mg total) by mouth as needed for smoking cessation. Use every 1-2 hours as needed. 100 tablet 5   ondansetron (ZOFRAN) 4 MG tablet Take 1 tablet (4 mg total) by mouth every 8  (eight) hours as needed for nausea or vomiting. 20 tablet 0   ondansetron (ZOFRAN-ODT) 4 MG disintegrating tablet Take 1 tablet (4 mg total) by mouth every 8 (eight) hours as needed for nausea or vomiting. 20 tablet 0   pantoprazole (PROTONIX) 40 MG tablet TAKE 1 TABLET (40 MG TOTAL) BY MOUTH DAILY (AM) 30 tablet 3   pravastatin (PRAVACHOL) 40 MG tablet TAKE 1 TABLET (40 MG TOTAL) BY MOUTH DAILY (BEDTIME) 90 tablet 1   Semaglutide, 2 MG/DOSE, 8 MG/3ML SOPN Inject 2 mg as directed once a week. 9 mL 2   traZODone (DESYREL) 100 MG tablet Take 1 tablet (100 mg total) by mouth at bedtime. 30 tablet 3   No current facility-administered medications on file prior to visit.    Objective:   Vitals:   09/28/22 1614  BP: 137/80  Pulse: 94  Resp: 16  SpO2: 100%  Weight: 230 lb 12.8 oz (104.7 kg)    Comprehensive ROS Pertinent positive and negative noted in HPI   Exam General appearance : Awake, alert, not in any distress. Speech Clear. Not toxic looking HEENT: Atraumatic and Normocephalic,  Neck: Supple, no JVD. No cervical lymphadenopathy.  Chest: Good air entry bilaterally, no added sounds  CVS: S1 S2 regular, no murmurs.  Abdomen: Bowel sounds present, Non tender and not distended with no gaurding, rigidity or rebound. Extremities: B/L Lower Ext shows no edema, both legs are warm to touch Neurology: Awake alert, and oriented X 3, CN II-XII intact, Non focal Skin: No Rash  Data Review Lab Results  Component Value Date   HGBA1C 7.1 (A) 09/28/2022   HGBA1C 8.9 (A) 05/12/2022   HGBA1C 8.5 (A) 09/16/2021    Assessment & Plan    Camya was seen today for diabetes.  Diagnoses and all orders for this visit:  Type 2 diabetes mellitus without complication, without long-term current use of insulin (HCC) -     POCT glycosylated hemoglobin (Hb A1C) down to 7.1 from 8.9 - educated on lifestyle modifications, including but not limited to diet choices and adding exercise to daily routine.    -     insulin glargine (LANTUS SOLOSTAR) 100 UNIT/ML Solostar Pen; Inject 56 Units into the skin daily.  Essential hypertension Close to goal  - < 130/80 Explained that having normal blood pressure is the goal and medications are helping to get to goal and maintain normal blood pressure. DIET: Limit salt intake, read nutrition labels to check salt content, limit fried and high fatty foods  Avoid using multisymptom OTC cold preparations that generally contain sudafed which can rise BP. Consult with pharmacist on best cold relief products to use for persons with HTN EXERCISE Discussed incorporating  exercise such as walking - 30 minutes most days of the week and can do in 10 minute intervals    -     losartan (COZAAR) 50 MG tablet; TAKE 1 TABLET (50 MG TOTAL) BY MOUTH DAILY (AM)  Patient have been counseled extensively about nutrition and exercise. Other issues discussed during this visit include: low cholesterol diet, weight control and daily exercise, foot care, annual eye examinations at Ophthalmology, importance of adherence with medications and regular follow-up. We also discussed long term complications of uncontrolled diabetes and hypertension.   Return for Dm/htn -labs.  The patient was given clear instructions to go to ER or return to medical center if symptoms don't improve, worsen or new problems develop. The patient verbalized understanding. The patient was told to call to get lab results if they haven't heard anything in the next week.   This note has been created with Education officer, environmental. Any transcriptional errors are unintentional.   Grayce Sessions, NP 10/03/2022, 10:24 PM

## 2022-10-06 ENCOUNTER — Other Ambulatory Visit (INDEPENDENT_AMBULATORY_CARE_PROVIDER_SITE_OTHER): Payer: Self-pay | Admitting: Primary Care

## 2022-10-06 DIAGNOSIS — E785 Hyperlipidemia, unspecified: Secondary | ICD-10-CM

## 2022-10-06 DIAGNOSIS — I10 Essential (primary) hypertension: Secondary | ICD-10-CM

## 2022-10-06 NOTE — Telephone Encounter (Signed)
Will forward to provider  

## 2022-10-13 ENCOUNTER — Encounter (HOSPITAL_COMMUNITY): Payer: Self-pay | Admitting: Psychiatry

## 2022-10-13 ENCOUNTER — Telehealth (INDEPENDENT_AMBULATORY_CARE_PROVIDER_SITE_OTHER): Payer: Medicaid Other | Admitting: Psychiatry

## 2022-10-13 DIAGNOSIS — F4329 Adjustment disorder with other symptoms: Secondary | ICD-10-CM | POA: Diagnosis not present

## 2022-10-13 DIAGNOSIS — F3341 Major depressive disorder, recurrent, in partial remission: Secondary | ICD-10-CM | POA: Diagnosis not present

## 2022-10-13 DIAGNOSIS — F411 Generalized anxiety disorder: Secondary | ICD-10-CM | POA: Diagnosis not present

## 2022-10-13 MED ORDER — NICOTINE 7 MG/24HR TD PT24: 7.0000 mg | MEDICATED_PATCH | Freq: Every day | TRANSDERMAL | 5 refills | Status: DC

## 2022-10-13 MED ORDER — DULOXETINE HCL 60 MG PO CPEP: ORAL_CAPSULE | ORAL | 3 refills | Status: DC

## 2022-10-13 MED ORDER — NICOTINE POLACRILEX 2 MG MT GUM: 2.0000 mg | CHEWING_GUM | OROMUCOSAL | 5 refills | Status: DC | PRN

## 2022-10-13 MED ORDER — HYDROXYZINE HCL 10 MG PO TABS: 10.0000 mg | ORAL_TABLET | Freq: Three times a day (TID) | ORAL | 3 refills | Status: DC | PRN

## 2022-10-13 MED ORDER — TRAZODONE HCL 100 MG PO TABS: 100.0000 mg | ORAL_TABLET | Freq: Every day | ORAL | 3 refills | Status: DC

## 2022-10-13 NOTE — Progress Notes (Signed)
BH MD/PA/NP OP Progress Note Virtual Visit via Video Note  I connected with [name] on 10/13/22 at 10:30 AM EDT by a video enabled telemedicine application and verified that I am speaking with the correct person using two identifiers.  Location: Patient: Home Provider: Clinic   I discussed the limitations of evaluation and management by telemedicine and the availability of in person appointments. The patient expressed understanding and agreed to proceed.  I provided 30 minutes of non-face-to-face time during this encounter.    10/13/2022 10:47 AM [name]  MRN:  [id number]  Chief Complaint: "I have been anxious"  HPI: 52 year old female seen today for follow-up psychiatric evaluation.   She has a psychiatric history of depression and prolonged grief.  Currently she is managed on trazodone 100 mg nightly and Cymbalta 60 mg twice daily.  She notes her medications are somewhat effective in managing her psychiatric conditions.  Today patient was well-groomed, pleasant, cooperative, and engaged in conversation.  She informed Clinical research associate she has been more anxious.  She notes that she worries about the death of her son, her family, and her finances.  Patient notes that she was followed by pain management but notes that Medicaid will no longer cover it and now has an expensive bill that she is worried about getting paid.  Today provider conducted GAD-7 and patient scored an 11, at her last visit she scored a 9.  Provider also conducted PHQ-9 patient scored an 11, at her last visit she scored a 3.  She notes that she is been sleeping 5 hours nightly.  She endorses having an adequate appetite.  Today she denies SI/HI/AVH, mania, or paranoia.    Patient informed writer that she continues to have leg and feet pain.  Since she is no longer followed by pain management she notes that her pain is getting worse.  Provider offered patient gabapentin to help manage anxiety and pain, however she notes that  it causes restless leg.  To help manage her anxiety patient agreeable to starting hydroxyzine 10 mg 3 times daily.Potential side effects of medication and risks vs benefits of treatment vs non-treatment were explained and discussed. All questions were answered. She will continue all other medications as prescribed.  Concerns but at this time.    Visit Diagnosis:    ICD-10-CM   1. Generalized anxiety disorder  F41.1 DULoxetine (CYMBALTA) 60 MG capsule    hydrOXYzine (ATARAX) 10 MG tablet    2. MDD (major depressive disorder), recurrent, in partial remission (HCC)  F33.41 traZODone (DESYREL) 100 MG tablet    DULoxetine (CYMBALTA) 60 MG capsule    3. 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 DULoxetine (CYMBALTA) 60 MG capsule      Past Psychiatric History:  MDD, grief with prolonged bereavement reaction  Past Medical History:  Past Medical History:  Diagnosis Date   Anxiety    Common migraine with intractable migraine 10/02/2016   Depression    Diabetes mellitus    Hypertension     Past Surgical History:  Procedure Laterality Date   CHOLECYSTECTOMY     TUBAL LIGATION      Family Psychiatric History: Denied any family psychiatric history  Family History:  Family History  Problem Relation Age of Onset   Diabetes Mother    Hypertension Mother    Renal Disease Mother    Cancer Father    Diabetes Sister    Diabetes Brother    Renal Disease Brother    Diabetes Brother  Diabetes Sister    Diabetes Brother    Diabetes Brother    Diabetes Brother    Healthy Daughter    Healthy Daughter     Social History:  Social History   Socioeconomic History   Marital status: Single    Spouse name: Not on file   Number of children: Not on file   Years of education: Not on file   Highest education level: Not on file  Occupational History   Not on file  Tobacco Use   Smoking status: Every Day    Packs/day: .25    Types: Cigarettes   Smokeless tobacco: Never  Vaping  Use   Vaping Use: Former  Substance and Sexual Activity   Alcohol use: No   Drug use: No   Sexual activity: Not on file  Other Topics Concern   Not on file  Social History Narrative   Right handed    Lives with daughter   Caffeine use: Drinks coffee/tea/soda sometimes   Social Determinants of Health   Financial Resource Strain: Low Risk  (06/30/2022)   Overall Financial Resource Strain (CARDIA)    Difficulty of Paying Living Expenses: Not hard at all  Food Insecurity: No Food Insecurity (06/30/2022)   Hunger Vital Sign    Worried About Running Out of Food in the Last Year: Never true    Ran Out of Food in the Last Year: Never true  Transportation Needs: No Transportation Needs (06/30/2022)   PRAPARE - Administrator, Civil Service (Medical): No    Lack of Transportation (Non-Medical): No  Physical Activity: Sufficiently Active (06/30/2022)   Exercise Vital Sign    Days of Exercise per Week: 5 days    Minutes of Exercise per Session: 30 min  Stress: No Stress Concern Present (06/30/2022)   [employer] of Occupational Health - Occupational Stress Questionnaire    Feeling of Stress : Not at all  Social Connections: Socially Integrated (06/30/2022)   Social Connection and Isolation Panel [NHANES]    Frequency of Communication with Friends and Family: More than three times a week    Frequency of Social Gatherings with Friends and Family: More than three times a week    Attends Religious Services: 1 to 4 times per year    Active Member of [employer] or Organizations: Yes    Attends Banker Meetings: 1 to 4 times per year    Marital Status: Living with partner    Allergies:  Allergies  Allergen Reactions   Lisinopril     angioedema   Trulicity [Dulaglutide] Other (See Comments)    Gas, abdominal bloating    Metabolic Disorder Labs: Lab Results  Component Value Date   HGBA1C 7.1 (A) 09/28/2022   No results found for: "PROLACTIN" Lab Results   Component Value Date   CHOL 195 05/29/2022   TRIG 103 05/29/2022   HDL 45 05/29/2022   CHOLHDL 4.3 05/29/2022   LDLCALC 131 (H) 05/29/2022   LDLCALC 158 (H) 01/07/2021   Lab Results  Component Value Date   TSH 2.870 10/02/2018   TSH 4.840 (H) 01/09/2018    Therapeutic Level Labs: No results found for: "LITHIUM" No results found for: "VALPROATE" No results found for: "CBMZ"  Current Medications: Current Outpatient Medications  Medication Sig Dispense Refill   hydrOXYzine (ATARAX) 10 MG tablet Take 1 tablet (10 mg total) by mouth 3 (three) times daily as needed. 90 tablet 3   Accu-Chek Softclix Lancets lancets Use to check  blood sugar three times daily. 100 each 6   albuterol (VENTOLIN HFA) 108 (90 Base) MCG/ACT inhaler Inhale 1-2 puffs into the lungs every 6 (six) hours as needed for wheezing or shortness of breath. 8 each 0   albuterol (VENTOLIN HFA) 108 (90 Base) MCG/ACT inhaler Inhale 1-2 puffs into the lungs every 6 (six) hours as needed for up to 14 days for wheezing or shortness of breath. 1 each 0   amLODipine (NORVASC) 10 MG tablet TAKE 1 TABLET (10 MG TOTAL) BY MOUTH DAILY. (AM) 90 tablet 0   aspirin EC 81 MG tablet Take 81 mg by mouth every 6 (six) hours as needed for mild pain.      blood glucose meter kit and supplies Dispense based on patient and insurance preference. Use up to four times daily as directed. (FOR ICD-10 E10.9, E11.9). 1 each 0   Blood Glucose Monitoring Suppl (ACCU-CHEK GUIDE) w/Device KIT Use to check blood sugar three times daily. 1 kit 0   celecoxib (CELEBREX) 200 MG capsule TAKE ONE CAPSULE BY MOUTH TWICE DAILY (AM+BEDTIME) 180 capsule 1   Continuous Blood Gluc Receiver (FREESTYLE LIBRE 2 READER) DEVI Use to check blood sugar continuously throughout the day. Change sensors once every 14 days. E11.9 1 each 0   Continuous Blood Gluc Sensor (FREESTYLE LIBRE 2 SENSOR) MISC Use to check blood sugar continuously throughout the day. Change sensors once every  14 days. E11.9 2 each 3   DULoxetine (CYMBALTA) 60 MG capsule TAKE 1 CAPSULE (60 MG TOTAL) BY MOUTH 2 (TWO) TIMES DAILY (AM+BEDTIME) 90 capsule 3   EASY COMFORT PEN NEEDLES 31G X 8 MM MISC USE TO INJECT LEVEMIR TWICE A DAY 100 each 2   fluconazole (DIFLUCAN) 150 MG tablet Take 1 tablet (150 mg total) by mouth every 3 (three) days. (Patient not taking: Reported on 05/12/2022) 2 tablet 0   fluticasone (FLONASE) 50 MCG/ACT nasal spray Place 2 sprays into both nostrils daily. 16 g 2   fluticasone (FLOVENT HFA) 44 MCG/ACT inhaler Inhale 2 puffs into the lungs 2 (two) times daily. 1 Inhaler 12   glucose blood (ACCU-CHEK GUIDE) test strip Use to check blood sugar three times daily. 100 each 6   HYDROcodone-acetaminophen (NORCO) 5-325 MG tablet Take 1 tablet by mouth every 4 (four) hours as needed for moderate pain. (Patient not taking: Reported on 05/12/2022) 10 tablet 0   insulin glargine (LANTUS SOLOSTAR) 100 UNIT/ML Solostar Pen Inject 56 Units into the skin daily. 45 mL 1   lidocaine (XYLOCAINE) 2 % jelly Place 1 Application into the urethra as needed. 30 mL 0   losartan (COZAAR) 50 MG tablet TAKE 1 TABLET (50 MG TOTAL) BY MOUTH DAILY (AM) 90 tablet 1   metFORMIN (GLUCOPHAGE) 1000 MG tablet TAKE ONE TABLET BY MOUTH TWICE DAILY (AM+BEDTIME) 60 tablet 0   metroNIDAZOLE (FLAGYL) 500 MG tablet Take 1 tablet (500 mg total) by mouth 2 (two) times daily. (Patient not taking: Reported on 09/28/2022) 14 tablet 0   nicotine (NICODERM CQ - DOSED IN MG/24 HR) 7 mg/24hr patch Place 1 patch (7 mg total) onto the skin daily. Remove before bedtime. 28 patch 5   nicotine polacrilex (NICORETTE) 2 MG gum Take 1 each (2 mg total) by mouth as needed for smoking cessation. Use every 1-2 hours as needed. 100 tablet 5   ondansetron (ZOFRAN) 4 MG tablet Take 1 tablet (4 mg total) by mouth every 8 (eight) hours as needed for nausea or vomiting. 20 tablet 0  ondansetron (ZOFRAN-ODT) 4 MG disintegrating tablet Take 1 tablet (4 mg  total) by mouth every 8 (eight) hours as needed for nausea or vomiting. 20 tablet 0   pantoprazole (PROTONIX) 40 MG tablet TAKE 1 TABLET (40 MG TOTAL) BY MOUTH DAILY (AM) 30 tablet 3   pravastatin (PRAVACHOL) 40 MG tablet TAKE 1 TABLET (40 MG TOTAL) BY MOUTH DAILY (BEDTIME) 90 tablet 1   Semaglutide, 2 MG/DOSE, 8 MG/3ML SOPN Inject 2 mg as directed once a week. 9 mL 2   traZODone (DESYREL) 100 MG tablet Take 1 tablet (100 mg total) by mouth at bedtime. 30 tablet 3   No current facility-administered medications for this visit.     Musculoskeletal: Strength & Muscle Tone: within normal limits and telehealth visit Gait & Station: normal, telehealth visit Patient leans: N/A  Psychiatric Specialty Exam: Review of Systems  There were no vitals taken for this visit.There is no height or weight on file to calculate BMI.  General Appearance: Well Groomed  Eye Contact:  Good  Speech:  Clear and Coherent and Normal Rate  Volume:  Normal  Mood:  Anxious and Depressed  Affect:  Appropriate and Congruent  Thought Process:  Coherent, Goal Directed, and Linear  Orientation:  Full (Time, Place, and Person)  Thought Content: WDL and Logical   Suicidal Thoughts:  No  Homicidal Thoughts:  No  Memory:  Immediate;   Good Recent;   Good Remote;   Good  Judgement:  Good  Insight:  Good  Psychomotor Activity:  Normal  Concentration:  Concentration: Good and Attention Span: Good  Recall:  Good  Fund of Knowledge: Good  Language: Good  Akathisia:  No  Handed:  Right  AIMS (if indicated): not done  Assets:  Communication Skills Desire for Improvement Financial Resources/Insurance Housing Leisure Time Physical Health Social Support  ADL's:  Intact  Cognition: WNL  Sleep:  Fair   Screenings: GAD-7    Flowsheet Row Video Visit from 10/13/2022 in [facility] Video Visit from 08/04/2022 in [facility] Video Visit from 05/12/2022 in [facility] Video Visit from 10/26/2021 in [facility] Video Visit from 05/19/2021 in [facility]  Total GAD-7 Score 11 9 6 9 18       PHQ2-9    Flowsheet Row Video Visit from 10/13/2022 in [facility] Video Visit from 08/04/2022 in [facility] Office Visit from 06/30/2022 in [facility] Video Visit from 05/12/2022 in [facility] Video Visit from 10/26/2021 in [facility]  PHQ-2 Total Score 2 0 3 3 0  PHQ-9 Total Score 11 3 10 10 6       Flowsheet Row ED from 07/07/2022 in [facility] ED from 12/19/2021 in [facility] ED from 08/10/2021 in [facility] Emergency Department at [facility]  C-SSRS RISK CATEGORY No Risk No Risk No Risk        Assessment and Plan: Patient endorses increased anxiety and depression due to life stressors.  She also notes that she has been in increased pain and is no longer followed by pain management. Provider offered patient gabapentin to help manage anxiety and pain, however she notes that it causes restless leg.  To help manage her anxiety patient agreeable to starting hydroxyzine 10 mg 3 times daily.  She  will continue all other medications as prescribed.   1. MDD (major depressive disorder), recurrent, in partial remission (HCC)  Continue- traZODone (DESYREL) 100 MG tablet; Take 1 tablet (100 mg total) by mouth at bedtime.  Dispense: 30 tablet; Refill: 3 Continue- DULoxetine (CYMBALTA) 60 MG capsule; TAKE 1 CAPSULE (60 MG TOTAL) BY MOUTH 2 (TWO) TIMES DAILY (AM+BEDTIME)  Dispense: 90 capsule; Refill: 3  2. Grief reaction with prolonged bereavement  Continue- DULoxetine (CYMBALTA) 60 MG capsule; TAKE 1 CAPSULE (60 MG TOTAL) BY MOUTH 2 (TWO) TIMES DAILY (AM+BEDTIME)  Dispense: 90 capsule;  Refill: 3  3. Generalized anxiety disorder  Continue- DULoxetine (CYMBALTA) 60 MG capsule; TAKE 1 CAPSULE (60 MG TOTAL) BY MOUTH 2 (TWO) TIMES DAILY (AM+BEDTIME)  Dispense: 90 capsule; Refill: 3 Start- hydrOXYzine (ATARAX) 10 MG tablet; Take 1 tablet (10 mg total) by mouth 3 (three) times daily as needed.  Dispense: 90 tablet; Refill: 3  Follow-up in 3 months Shanna Cisco, NP 10/13/2022, 10:47 AM

## 2022-10-17 ENCOUNTER — Other Ambulatory Visit (INDEPENDENT_AMBULATORY_CARE_PROVIDER_SITE_OTHER): Payer: Self-pay | Admitting: Primary Care

## 2022-10-17 NOTE — Telephone Encounter (Signed)
Requested Prescriptions  Pending Prescriptions Disp Refills   fluticasone (FLONASE) 50 MCG/ACT nasal spray [Pharmacy Med Name: FLUTICASONE PROPIONATE 50 MCG/ACT NASAL SUSPENSION] 16 g 2    Sig: PLACE 2 SPRAYS INTO BOTH NOSTRILS DAILY.     Ear, Nose, and Throat: Nasal Preparations - Corticosteroids Passed - 10/17/2022  9:31 AM      Passed - Valid encounter within last 12 months    Recent Outpatient Visits           2 weeks ago Type 2 diabetes mellitus without complication, without long-term current use of insulin (HCC)   [place] [facility] Grayce Sessions, NP   2 months ago Type 2 diabetes mellitus without complication, without long-term current use of insulin [facility])   [facility] [place], Edom L, RPH-CPP   3 months ago Type 2 diabetes mellitus without complication, with long-term current use of insulin [facility])   [facility] [place], [place] L, RPH-CPP   4 months ago Type 2 diabetes mellitus without complication, with long-term current use of insulin [facility])   [facility] [place], [place] L, RPH-CPP   5 months ago Type 2 diabetes mellitus without complication, without long-term current use of insulin [facility])   [place] [facility] Grayce Sessions, NP

## 2022-10-25 ENCOUNTER — Telehealth (HOSPITAL_COMMUNITY): Payer: Self-pay | Admitting: *Deleted

## 2022-10-25 ENCOUNTER — Other Ambulatory Visit (HOSPITAL_COMMUNITY): Payer: Self-pay | Admitting: Psychiatry

## 2022-10-25 DIAGNOSIS — R41840 Attention and concentration deficit: Secondary | ICD-10-CM

## 2022-10-25 MED ORDER — ATOMOXETINE HCL 40 MG PO CAPS: 40.0000 mg | ORAL_CAPSULE | Freq: Every day | ORAL | 3 refills | Status: DC

## 2022-10-25 NOTE — Telephone Encounter (Signed)
Patient informed Clinical research associate that she recently she has been inattentive, forgetful, disorganized, and has issues focusing on mentally taxing past.  She notes that she is concerned because she will soon care for her young grandchild and wants to be attentive for them.  She inform her that her neighbor gave her some Adderall and notes that she found it really effective in managing her attention.  Provider informed patient that Adderall would not be filled at this time but instead offered her Strattera.  Patient agreeable to taking Strattera 40 mg to help manage attention.  Patient notes that her depression and anxiety continues to be well-managed. Potential side effects of medication and risks vs benefits of treatment vs non-treatment were explained and discussed. All questions were answered.  No other concerns at this time.

## 2022-10-25 NOTE — Telephone Encounter (Signed)
She would like a return call from her MD at [phone] in regards to her medications. Patient did not wish to give any information to this writer over the phone.

## 2022-11-14 DIAGNOSIS — G4733 Obstructive sleep apnea (adult) (pediatric): Secondary | ICD-10-CM | POA: Diagnosis not present

## 2022-12-13 ENCOUNTER — Other Ambulatory Visit (INDEPENDENT_AMBULATORY_CARE_PROVIDER_SITE_OTHER): Payer: Self-pay | Admitting: Primary Care

## 2022-12-13 ENCOUNTER — Ambulatory Visit (INDEPENDENT_AMBULATORY_CARE_PROVIDER_SITE_OTHER): Payer: Medicaid Other | Admitting: Primary Care

## 2022-12-13 DIAGNOSIS — E119 Type 2 diabetes mellitus without complications: Secondary | ICD-10-CM

## 2022-12-13 DIAGNOSIS — M2550 Pain in unspecified joint: Secondary | ICD-10-CM

## 2022-12-13 DIAGNOSIS — M170 Bilateral primary osteoarthritis of knee: Secondary | ICD-10-CM

## 2022-12-13 NOTE — Progress Notes (Signed)
[facility]  Telephone Note  I connected with [name], on 12/13/2022 at 12:53 PM  by telephone and verified that I am speaking with the correct person using two identifiers.   Consent: I discussed the limitations, risks, security and privacy concerns of performing an evaluation and management service by telephone and the availability of in person appointments. I also discussed with the patient that there may be a patient responsible charge related to this service. The patient expressed understanding and agreed to proceed.   Location of Patient: Home  Location of Provider: [facility]   Persons participating in Telemedicine visit: Tivis Ringer,  NP   History of Present Illness: [name]  52 year old female called to say she would not be able to keep your appt because bilateral leg pain - right leg on the side is swollen denies warm to touch states every now and then a knott pops up on the side- intermittent started month and a half ago.Right Hip down tingling , denies any incontinence. Aggravating factors standing on it and moving around. Due to decrease ability to walk she is unable to provide ADL's requesting someone to help with bathing, clothing and meal prep . (She has a dx or bilateral osteoarthritis of her knee.) She had not send ortho since Jan.20 24. She received a cortisone shot and develop a migraine for 2 weeks and never went back. Patient has No headache, No chest pain, No abdominal pain - No Nausea, No new weakness tingling or numbness, No Cough - shortness of breath    Past Medical History:  Diagnosis Date   Anxiety    Common migraine with intractable migraine 10/02/2016   Depression    Diabetes mellitus    Hypertension    Allergies  Allergen Reactions   Lisinopril     angioedema   Trulicity [Dulaglutide] Other (See Comments)    Gas, abdominal bloating    Current  Outpatient Medications on File Prior to Visit  Medication Sig Dispense Refill   Accu-Chek Softclix Lancets lancets Use to check blood sugar three times daily. 100 each 6   albuterol (VENTOLIN HFA) 108 (90 Base) MCG/ACT inhaler Inhale 1-2 puffs into the lungs every 6 (six) hours as needed for wheezing or shortness of breath. 8 each 0   albuterol (VENTOLIN HFA) 108 (90 Base) MCG/ACT inhaler Inhale 1-2 puffs into the lungs every 6 (six) hours as needed for up to 14 days for wheezing or shortness of breath. 1 each 0   amLODipine (NORVASC) 10 MG tablet TAKE 1 TABLET (10 MG TOTAL) BY MOUTH DAILY. (AM) 90 tablet 0   aspirin EC 81 MG tablet Take 81 mg by mouth every 6 (six) hours as needed for mild pain.      atomoxetine (STRATTERA) 40 MG capsule Take 1 capsule (40 mg total) by mouth daily. 30 capsule 3   blood glucose meter kit and supplies Dispense based on patient and insurance preference. Use up to four times daily as directed. (FOR ICD-10 E10.9, E11.9). 1 each 0   Blood Glucose Monitoring Suppl (ACCU-CHEK GUIDE) w/Device KIT Use to check blood sugar three times daily. 1 kit 0   celecoxib (CELEBREX) 200 MG capsule TAKE ONE CAPSULE BY MOUTH TWICE DAILY (AM+BEDTIME) 180 capsule 1   Continuous Blood Gluc Receiver (FREESTYLE LIBRE 2 READER) DEVI Use to check blood sugar continuously throughout the day. Change sensors once every 14 days. E11.9 1 each 0  Continuous Blood Gluc Sensor (FREESTYLE LIBRE 2 SENSOR) MISC Use to check blood sugar continuously throughout the day. Change sensors once every 14 days. E11.9 2 each 3   DULoxetine (CYMBALTA) 60 MG capsule TAKE 1 CAPSULE (60 MG TOTAL) BY MOUTH 2 (TWO) TIMES DAILY (AM+BEDTIME) 90 capsule 3   EASY COMFORT PEN NEEDLES 31G X 8 MM MISC USE TO INJECT LEVEMIR TWICE A DAY 100 each 2   fluconazole (DIFLUCAN) 150 MG tablet Take 1 tablet (150 mg total) by mouth every 3 (three) days. (Patient not taking: Reported on 05/12/2022) 2 tablet 0   fluticasone (FLONASE) 50  MCG/ACT nasal spray PLACE 2 SPRAYS INTO BOTH NOSTRILS DAILY. 16 g 2   fluticasone (FLOVENT HFA) 44 MCG/ACT inhaler Inhale 2 puffs into the lungs 2 (two) times daily. 1 Inhaler 12   glucose blood (ACCU-CHEK GUIDE) test strip Use to check blood sugar three times daily. 100 each 6   HYDROcodone-acetaminophen (NORCO) 5-325 MG tablet Take 1 tablet by mouth every 4 (four) hours as needed for moderate pain. (Patient not taking: Reported on 05/12/2022) 10 tablet 0   hydrOXYzine (ATARAX) 10 MG tablet Take 1 tablet (10 mg total) by mouth 3 (three) times daily as needed. 90 tablet 3   insulin glargine (LANTUS SOLOSTAR) 100 UNIT/ML Solostar Pen Inject 56 Units into the skin daily. 45 mL 1   lidocaine (XYLOCAINE) 2 % jelly Place 1 Application into the urethra as needed. 30 mL 0   losartan (COZAAR) 50 MG tablet TAKE 1 TABLET (50 MG TOTAL) BY MOUTH DAILY (AM) 90 tablet 1   metFORMIN (GLUCOPHAGE) 1000 MG tablet TAKE ONE TABLET BY MOUTH TWICE DAILY (AM+BEDTIME) 60 tablet 0   metroNIDAZOLE (FLAGYL) 500 MG tablet Take 1 tablet (500 mg total) by mouth 2 (two) times daily. (Patient not taking: Reported on 09/28/2022) 14 tablet 0   nicotine (NICODERM CQ - DOSED IN MG/24 HR) 7 mg/24hr patch Place 1 patch (7 mg total) onto the skin daily. Remove before bedtime. 28 patch 5   nicotine polacrilex (NICORETTE) 2 MG gum Take 1 each (2 mg total) by mouth as needed for smoking cessation. Use every 1-2 hours as needed. 100 tablet 5   ondansetron (ZOFRAN) 4 MG tablet Take 1 tablet (4 mg total) by mouth every 8 (eight) hours as needed for nausea or vomiting. 20 tablet 0   ondansetron (ZOFRAN-ODT) 4 MG disintegrating tablet Take 1 tablet (4 mg total) by mouth every 8 (eight) hours as needed for nausea or vomiting. 20 tablet 0   pantoprazole (PROTONIX) 40 MG tablet TAKE 1 TABLET (40 MG TOTAL) BY MOUTH DAILY (AM) 30 tablet 3   pravastatin (PRAVACHOL) 40 MG tablet TAKE 1 TABLET (40 MG TOTAL) BY MOUTH DAILY (BEDTIME) 90 tablet 1    Semaglutide, 2 MG/DOSE, 8 MG/3ML SOPN Inject 2 mg as directed once a week. 9 mL 2   traZODone (DESYREL) 100 MG tablet Take 1 tablet (100 mg total) by mouth at bedtime. 30 tablet 3   No current facility-administered medications on file prior to visit.    Observations/Objective: There were no vitals taken for this visit.  Assessment and Plan: Diagnoses and all orders for this visit:  Primary osteoarthritis of both knees -     Ambulatory referral to Orthopedic Surgery     Follow Up Instructions: Keep schedule    I discussed the assessment and treatment plan with the patient. The patient was provided an opportunity to ask questions and all were answered. The patient  agreed with the plan and demonstrated an understanding of the instructions.   The patient was advised to call back or seek an in-person evaluation if the symptoms worsen or if the condition fails to improve as anticipated.     I provided 15 minutes total of non-face-to-face time during this encounter including median intraservice time, reviewing previous notes, investigations, ordering medications, medical decision making, coordinating care and patient verbalized understanding at the end of the visit.    This note has been created with Education officer, environmental. Any transcriptional errors are unintentional.   Grayce Sessions, NP 12/13/2022, 12:53 PM

## 2022-12-14 NOTE — Telephone Encounter (Signed)
Requested Prescriptions  Pending Prescriptions Disp Refills   metFORMIN (GLUCOPHAGE) 1000 MG tablet [Pharmacy Med Name: METFORMIN HCL 1000 MG ORAL TABLET] 60 tablet 0    Sig: TAKE ONE TABLET BY MOUTH TWICE DAILY (AM+BEDTIME)     Endocrinology:  Diabetes - Biguanides Failed - 12/13/2022  4:09 PM      Failed - B12 Level in normal range and within 720 days    Vitamin B-12  Date Value Ref Range Status  08/01/2018 301 232 - 1,245 pg/mL Final         Failed - CBC within normal limits and completed in the last 12 months    WBC  Date Value Ref Range Status  08/10/2021 13.1 (H) 4.0 - 10.5 K/uL Final   RBC  Date Value Ref Range Status  08/10/2021 5.78 (H) 3.87 - 5.11 MIL/uL Final   Hemoglobin  Date Value Ref Range Status  08/10/2021 14.6 12.0 - 15.0 g/dL Final  24/40/1027 25.3 11.1 - 15.9 g/dL Final   HCT  Date Value Ref Range Status  08/10/2021 43.3 36.0 - 46.0 % Final   Hematocrit  Date Value Ref Range Status  01/07/2021 46.1 34.0 - 46.6 % Final   MCHC  Date Value Ref Range Status  08/10/2021 33.7 30.0 - 36.0 g/dL Final   [facility]  Date Value Ref Range Status  08/10/2021 25.3 (L) 26.0 - 34.0 pg Final   MCV  Date Value Ref Range Status  08/10/2021 74.9 (L) 80.0 - 100.0 fL Final  01/07/2021 76 (L) 79 - 97 fL Final   No results found for: "PLTCOUNTKUC", "LABPLAT", "POCPLA" RDW  Date Value Ref Range Status  08/10/2021 14.6 11.5 - 15.5 % Final  01/07/2021 14.2 11.7 - 15.4 % Final         Passed - Cr in normal range and within 360 days    Creatinine, Ser  Date Value Ref Range Status  05/29/2022 0.71 0.57 - 1.00 mg/dL Final         Passed - HBA1C is between 0 and 7.9 and within 180 days    HbA1c, POC (controlled diabetic range)  Date Value Ref Range Status  09/28/2022 7.1 (A) 0.0 - 7.0 % Final         Passed - eGFR in normal range and within 360 days    GFR calc Af Amer  Date Value Ref Range Status  12/07/2019 >60 >60 mL/min Final   GFR, Estimated  Date Value Ref  Range Status  08/10/2021 >60 >60 mL/min Final    Comment:    (NOTE) Calculated using the CKD-EPI Creatinine Equation (2021)    eGFR  Date Value Ref Range Status  05/29/2022 101 >59 mL/min/1.73 Final         Passed - Valid encounter within last 6 months    Recent Outpatient Visits           Yesterday Primary osteoarthritis of both knees   [place] [facility] Grayce Sessions, NP   2 months ago Type 2 diabetes mellitus without complication, without long-term current use of insulin (HCC)   [place] [facility] Grayce Sessions, NP   4 months ago Type 2 diabetes mellitus without complication, without long-term current use of insulin [facility])   [place] [facility] [place], Jeannett Senior L, RPH-CPP   5 months ago Type 2 diabetes mellitus without complication, with long-term current use of insulin (HCC)   [place] [facility] Yehuda Savannah L, RPH-CPP   6 months ago Type 2 diabetes mellitus without complication, with long-term current use of insulin [facility])   [place] [facility] Lois Huxley, Cornelius Moras, RPH-CPP       Future Appointments             In 1 week August Saucer, Corrie Mckusick, MD [facility]            Refused Prescriptions Disp Refills   gabapentin (NEURONTIN) 300 MG capsule [Pharmacy Med Name: GABAPENTIN 300 MG ORAL CAPSULE] 90 capsule 2    Sig: TAKE ONE CAPSULE BY MOUTH THREE TIMES DAILY ( AM+NOON+BEDTIME)     Neurology: Anticonvulsants - gabapentin Passed - 12/13/2022  4:09 PM      Passed - Cr in normal range and within 360 days    Creatinine, Ser  Date Value Ref Range Status  05/29/2022 0.71 0.57 - 1.00 mg/dL Final         Passed - Completed PHQ-2 or PHQ-9 in the last 360 days      Passed - Valid encounter within last 12 months    Recent Outpatient Visits           Yesterday Primary osteoarthritis of both knees   [facility] Grayce Sessions, NP   2 months ago Type 2 diabetes mellitus without complication, without long-term current use of insulin (HCC)   [place] [facility] Grayce Sessions, NP   4 months ago Type 2 diabetes mellitus without complication, without long-term current use of insulin [facility])   [place] [facility] [place], Harrold L, RPH-CPP   5 months ago Type 2 diabetes mellitus without complication, with long-term current use of insulin [facility])   [place] [facility] [place], [place] L, RPH-CPP   6 months ago Type 2 diabetes mellitus without complication, with long-term current use of insulin [facility])   [place] [facility] Drucilla Chalet, RPH-CPP       Future Appointments             In 1 week August Saucer, Corrie Mckusick, MD [facility]

## 2022-12-14 NOTE — Telephone Encounter (Signed)
Requested medications are due for refill today.  yes  Requested medications are on the active medications list.  yes  Last refill. 10/02/2022 #60 0 rf  Future visit scheduled.   no  Notes to clinic.  Expired labs.    Requested Prescriptions  Pending Prescriptions Disp Refills   metFORMIN (GLUCOPHAGE) 1000 MG tablet [Pharmacy Med Name: METFORMIN HCL 1000 MG ORAL TABLET] 60 tablet 0    Sig: TAKE ONE TABLET BY MOUTH TWICE DAILY (AM+BEDTIME)     Endocrinology:  Diabetes - Biguanides Failed - 12/13/2022  4:09 PM      Failed - B12 Level in normal range and within 720 days    Vitamin B-12  Date Value Ref Range Status  08/01/2018 301 232 - 1,245 pg/mL Final         Failed - CBC within normal limits and completed in the last 12 months    WBC  Date Value Ref Range Status  08/10/2021 13.1 (H) 4.0 - 10.5 K/uL Final   RBC  Date Value Ref Range Status  08/10/2021 5.78 (H) 3.87 - 5.11 MIL/uL Final   Hemoglobin  Date Value Ref Range Status  08/10/2021 14.6 12.0 - 15.0 g/dL Final  16/01/9603 54.0 11.1 - 15.9 g/dL Final   HCT  Date Value Ref Range Status  08/10/2021 43.3 36.0 - 46.0 % Final   Hematocrit  Date Value Ref Range Status  01/07/2021 46.1 34.0 - 46.6 % Final   MCHC  Date Value Ref Range Status  08/10/2021 33.7 30.0 - 36.0 g/dL Final   [facility]  Date Value Ref Range Status  08/10/2021 25.3 (L) 26.0 - 34.0 pg Final   MCV  Date Value Ref Range Status  08/10/2021 74.9 (L) 80.0 - 100.0 fL Final  01/07/2021 76 (L) 79 - 97 fL Final   No results found for: "PLTCOUNTKUC", "LABPLAT", "POCPLA" RDW  Date Value Ref Range Status  08/10/2021 14.6 11.5 - 15.5 % Final  01/07/2021 14.2 11.7 - 15.4 % Final         Passed - Cr in normal range and within 360 days    Creatinine, Ser  Date Value Ref Range Status  05/29/2022 0.71 0.57 - 1.00 mg/dL Final         Passed - HBA1C is between 0 and 7.9 and within 180 days    HbA1c, POC (controlled diabetic range)  Date Value Ref Range  Status  09/28/2022 7.1 (A) 0.0 - 7.0 % Final         Passed - eGFR in normal range and within 360 days    GFR calc Af Amer  Date Value Ref Range Status  12/07/2019 >60 >60 mL/min Final   GFR, Estimated  Date Value Ref Range Status  08/10/2021 >60 >60 mL/min Final    Comment:    (NOTE) Calculated using the CKD-EPI Creatinine Equation (2021)    eGFR  Date Value Ref Range Status  05/29/2022 101 >59 mL/min/1.73 Final         Passed - Valid encounter within last 6 months    Recent Outpatient Visits           Yesterday Primary osteoarthritis of both knees   [place] [facility] Grayce Sessions, NP   2 months ago Type 2 diabetes mellitus without complication, without long-term current use of insulin (HCC)   [place] [facility] Grayce Sessions, NP   4 months ago Type 2 diabetes mellitus without complication, without long-term current  use of insulin [facility])   [place] [facility] [place], Jeannett Senior L, RPH-CPP   5 months ago Type 2 diabetes mellitus without complication, with long-term current use of insulin [facility])   [place] [facility] [place], Jeannett Senior L, RPH-CPP   6 months ago Type 2 diabetes mellitus without complication, with long-term current use of insulin [facility])   [place] [facility] Drucilla Chalet, RPH-CPP       Future Appointments             In 1 week August Saucer, Corrie Mckusick, MD [facility]            Refused Prescriptions Disp Refills   gabapentin (NEURONTIN) 300 MG capsule [Pharmacy Med Name: GABAPENTIN 300 MG ORAL CAPSULE] 90 capsule 2    Sig: TAKE ONE CAPSULE BY MOUTH THREE TIMES DAILY ( AM+NOON+BEDTIME)     Neurology: Anticonvulsants - gabapentin Passed - 12/13/2022  4:09 PM      Passed - Cr in normal range and within 360 days    Creatinine, Ser  Date Value Ref Range Status  05/29/2022 0.71 0.57 - 1.00  mg/dL Final         Passed - Completed PHQ-2 or PHQ-9 in the last 360 days      Passed - Valid encounter within last 12 months    Recent Outpatient Visits           Yesterday Primary osteoarthritis of both knees   [place] [facility] Grayce Sessions, NP   2 months ago Type 2 diabetes mellitus without complication, without long-term current use of insulin (HCC)   [place] [facility] Grayce Sessions, NP   4 months ago Type 2 diabetes mellitus without complication, without long-term current use of insulin [facility])   [place] [facility] [place], [place] L, RPH-CPP   5 months ago Type 2 diabetes mellitus without complication, with long-term current use of insulin [facility])   [place] [facility] [place], [place] L, RPH-CPP   6 months ago Type 2 diabetes mellitus without complication, with long-term current use of insulin [facility])   [place] [facility] Drucilla Chalet, RPH-CPP       Future Appointments             In 1 week August Saucer, Corrie Mckusick, MD [facility]

## 2022-12-15 NOTE — Telephone Encounter (Signed)
Requested medications are due for refill today.  yes  Requested medications are on the active medications list.  yes  Last refill. 10/02/2022 #60 0 rf  Future visit scheduled.   no  Notes to clinic.  Labs are expired.    Requested Prescriptions  Pending Prescriptions Disp Refills   metFORMIN (GLUCOPHAGE) 1000 MG tablet [Pharmacy Med Name: METFORMIN HCL 1000 MG ORAL TABLET] 60 tablet 0    Sig: TAKE ONE TABLET BY MOUTH TWICE DAILY (AM+BEDTIME)     Endocrinology:  Diabetes - Biguanides Failed - 12/15/2022  7:33 AM      Failed - B12 Level in normal range and within 720 days    Vitamin B-12  Date Value Ref Range Status  08/01/2018 301 232 - 1,245 pg/mL Final         Failed - CBC within normal limits and completed in the last 12 months    WBC  Date Value Ref Range Status  08/10/2021 13.1 (H) 4.0 - 10.5 K/uL Final   RBC  Date Value Ref Range Status  08/10/2021 5.78 (H) 3.87 - 5.11 MIL/uL Final   Hemoglobin  Date Value Ref Range Status  08/10/2021 14.6 12.0 - 15.0 g/dL Final  54/12/8117 14.7 11.1 - 15.9 g/dL Final   HCT  Date Value Ref Range Status  08/10/2021 43.3 36.0 - 46.0 % Final   Hematocrit  Date Value Ref Range Status  01/07/2021 46.1 34.0 - 46.6 % Final   MCHC  Date Value Ref Range Status  08/10/2021 33.7 30.0 - 36.0 g/dL Final   [place] [facility]  Date Value Ref Range Status  08/10/2021 25.3 (L) 26.0 - 34.0 pg Final   MCV  Date Value Ref Range Status  08/10/2021 74.9 (L) 80.0 - 100.0 fL Final  01/07/2021 76 (L) 79 - 97 fL Final   No results found for: "PLTCOUNTKUC", "LABPLAT", "POCPLA" RDW  Date Value Ref Range Status  08/10/2021 14.6 11.5 - 15.5 % Final  01/07/2021 14.2 11.7 - 15.4 % Final         Passed - Cr in normal range and within 360 days    Creatinine, Ser  Date Value Ref Range Status  05/29/2022 0.71 0.57 - 1.00 mg/dL Final         Passed - HBA1C is between 0 and 7.9 and within 180 days    HbA1c, POC (controlled diabetic range)  Date Value Ref  Range Status  09/28/2022 7.1 (A) 0.0 - 7.0 % Final         Passed - eGFR in normal range and within 360 days    GFR calc Af Amer  Date Value Ref Range Status  12/07/2019 >60 >60 mL/min Final   GFR, Estimated  Date Value Ref Range Status  08/10/2021 >60 >60 mL/min Final    Comment:    (NOTE) Calculated using the CKD-EPI Creatinine Equation (2021)    eGFR  Date Value Ref Range Status  05/29/2022 101 >59 mL/min/1.73 Final         Passed - Valid encounter within last 6 months    Recent Outpatient Visits           2 days ago Primary osteoarthritis of both knees   [place] [facility] Grayce Sessions, NP   2 months ago Type 2 diabetes mellitus without complication, without long-term current use of insulin (HCC)   [place] [facility] Grayce Sessions, NP   4 months ago Type 2 diabetes mellitus without complication,  without long-term current use of insulin [facility])   [place] [facility] [place], Jeannett Senior L, RPH-CPP   5 months ago Type 2 diabetes mellitus without complication, with long-term current use of insulin [facility])   [place] [facility] [place], Jeannett Senior L, RPH-CPP   6 months ago Type 2 diabetes mellitus without complication, with long-term current use of insulin [facility])   [place] [facility] Drucilla Chalet, RPH-CPP       Future Appointments             In 1 week August Saucer, Corrie Mckusick, MD [facility]            Refused Prescriptions Disp Refills   gabapentin (NEURONTIN) 300 MG capsule [Pharmacy Med Name: GABAPENTIN 300 MG ORAL CAPSULE] 90 capsule 2    Sig: TAKE ONE CAPSULE BY MOUTH THREE TIMES DAILY ( AM+NOON+BEDTIME)     Neurology: Anticonvulsants - gabapentin Passed - 12/15/2022  7:33 AM      Passed - Cr in normal range and within 360 days    Creatinine, Ser  Date Value Ref Range Status  05/29/2022 0.71 0.57 -  1.00 mg/dL Final         Passed - Completed PHQ-2 or PHQ-9 in the last 360 days      Passed - Valid encounter within last 12 months    Recent Outpatient Visits           2 days ago Primary osteoarthritis of both knees   [place] [facility] Grayce Sessions, NP   2 months ago Type 2 diabetes mellitus without complication, without long-term current use of insulin (HCC)   [place] [facility] Grayce Sessions, NP   4 months ago Type 2 diabetes mellitus without complication, without long-term current use of insulin [facility])   [place] [facility] [place], [place] L, RPH-CPP   5 months ago Type 2 diabetes mellitus without complication, with long-term current use of insulin [facility])    [place] [facility] [place], [place]ley L, RPH-CPP   6 months ago Type 2 diabetes mellitus without complication, with long-term current use of insulin [facility])    [facility] Drucilla Chalet, RPH-CPP       Future Appointments             In 1 week August Saucer, Corrie Mckusick, MD [facility]

## 2022-12-22 ENCOUNTER — Ambulatory Visit (INDEPENDENT_AMBULATORY_CARE_PROVIDER_SITE_OTHER): Payer: Self-pay | Admitting: *Deleted

## 2022-12-22 NOTE — Telephone Encounter (Signed)
Summary: BP concern   The patient would like to speak with a member of clinical staff when possible  Their BP was 158/97 when checked this morning at 8:30 AM approx  The patient shares that their BP was elevated last night 178/110 checked at 10 PM 12/21/22  The patient shares that they are experiencing a headache that is concerning them  The patient would like to speak with a member of clinical staff further when possible  ----- Message from [facility] sent at 12/22/2022  8:45 AM EDT ----- The patient would like to speak with a member of clinical staff when possible  Their BP was 158/97 when checked this morning at 8:30 AM approx  The patient shares that their BP was elevated last night 178/110 checked at 10 PM 12/21/22  The patient shares that they are experiencing a headache that is concerning them  The patient would like to speak with a member of clinical staff further when possible          Call History  Contact Date/Time Type Contact Phone/Fax User  12/22/2022 08:43 AM EDT Phone (Incoming) Jasime, Sill (Self) [phone] Judie Petit) Coley, Everette A   Reason for Disposition  Systolic BP  >= 180 OR Diastolic >= 110  Answer Assessment - Initial Assessment Questions 1. BLOOD PRESSURE: "What is the blood pressure?" "Did you take at least two measurements 5 minutes apart?"     Last night 178/110 this morning 158/97.   Having headache too.  I'm having nausea.   A couple of days ago I didn't know what my BP was.   I was lightheaded like I wanted to faint.   Last night this happened again.    My head started getting lightheaded like I was going to pass out.   I checked my BP 178/110. I took my BP last night and I went to bed and I went to sleep.    2. ONSET: "When did you take your blood pressure?"     This morning 3. HOW: "How did you take your blood pressure?" (e.g., automatic home BP monitor, visiting nurse)     My machine with upper arm cuff 4. HISTORY: "Do you have a history of  high blood pressure?"     Yes 5. MEDICINES: "Are you taking any medicines for blood pressure?" "Have you missed any doses recently?"     Yes     I ran out of my medicines and I just got them filled.    I was off of them for 3 days.   I refilled them Wed.   I asked her how her BP did prior to running out of her medicine.   I keep a migraine headache low grade all the time.     The headache is unchanged even with being back on my medicines. 6. OTHER SYMPTOMS: "Do you have any symptoms?" (e.g., blurred vision, chest pain, difficulty breathing, headache, weakness)     Headache and nausea 7. PREGNANCY: "Is there any chance you are pregnant?" "When was your last menstrual period?"     Not asked  Protocols used: Blood Pressure - High-A-AH

## 2022-12-22 NOTE — Telephone Encounter (Signed)
  Chief Complaint: Elevated BP.   Ran out of her BP meds for 3 days.   Has restarted them but BP remains elevated  Symptoms: low grade headache that she says she has all the time.   It's unchanged. Frequency: Last night and this morning BP elevated and feeling lightheaded at times. Pertinent Negatives: Patient denies passing out or the headache being worse. Disposition: [] ED /[] Urgent Care (no appt availability in office) / [x] Appointment(In office/virtual)/ []  [facility]/ [] Home Care/ [] Refused Recommended Disposition /[] [facility]/ []  Follow-up with PCP Additional Notes: There was an appt available with Gwinda Passe, NP for today at 10:30 however pt said she could not make it.   I scheduled her for the next available appt for 12/27/2022 at 3:30 with Elon Jester with instructions to go to the ED if her symptoms became worse, headache worsened, fainted, lightheadedness becomes worse.   Pt. Agreeable to this plan.

## 2022-12-25 ENCOUNTER — Ambulatory Visit: Payer: Medicaid Other | Admitting: Orthopedic Surgery

## 2022-12-27 ENCOUNTER — Ambulatory Visit (INDEPENDENT_AMBULATORY_CARE_PROVIDER_SITE_OTHER): Payer: Medicaid Other | Admitting: Primary Care

## 2023-01-10 ENCOUNTER — Ambulatory Visit: Payer: Medicaid Other | Admitting: Orthopedic Surgery

## 2023-01-12 ENCOUNTER — Telehealth (INDEPENDENT_AMBULATORY_CARE_PROVIDER_SITE_OTHER): Payer: Medicaid Other | Admitting: Psychiatry

## 2023-01-12 ENCOUNTER — Other Ambulatory Visit (INDEPENDENT_AMBULATORY_CARE_PROVIDER_SITE_OTHER): Payer: Self-pay | Admitting: Primary Care

## 2023-01-12 ENCOUNTER — Encounter (HOSPITAL_COMMUNITY): Payer: Self-pay | Admitting: Psychiatry

## 2023-01-12 DIAGNOSIS — E119 Type 2 diabetes mellitus without complications: Secondary | ICD-10-CM

## 2023-01-12 DIAGNOSIS — M2550 Pain in unspecified joint: Secondary | ICD-10-CM

## 2023-01-12 DIAGNOSIS — F3341 Major depressive disorder, recurrent, in partial remission: Secondary | ICD-10-CM | POA: Diagnosis not present

## 2023-01-12 DIAGNOSIS — F4329 Adjustment disorder with other symptoms: Secondary | ICD-10-CM | POA: Diagnosis not present

## 2023-01-12 DIAGNOSIS — R41840 Attention and concentration deficit: Secondary | ICD-10-CM

## 2023-01-12 DIAGNOSIS — F411 Generalized anxiety disorder: Secondary | ICD-10-CM | POA: Diagnosis not present

## 2023-01-12 MED ORDER — ATOMOXETINE HCL 40 MG PO CAPS: 40.0000 mg | ORAL_CAPSULE | Freq: Every day | ORAL | 3 refills | Status: DC

## 2023-01-12 MED ORDER — DULOXETINE HCL 60 MG PO CPEP: ORAL_CAPSULE | ORAL | 3 refills | Status: DC

## 2023-01-12 MED ORDER — NICOTINE 7 MG/24HR TD PT24: 7.0000 mg | MEDICATED_PATCH | Freq: Every day | TRANSDERMAL | 5 refills | Status: DC

## 2023-01-12 MED ORDER — NICOTINE POLACRILEX 2 MG MT GUM: 2.0000 mg | CHEWING_GUM | OROMUCOSAL | 5 refills | Status: DC | PRN

## 2023-01-12 MED ORDER — HYDROXYZINE HCL 10 MG PO TABS: 10.0000 mg | ORAL_TABLET | Freq: Three times a day (TID) | ORAL | 3 refills | Status: DC | PRN

## 2023-01-12 MED ORDER — TRAZODONE HCL 100 MG PO TABS: 100.0000 mg | ORAL_TABLET | Freq: Every day | ORAL | 3 refills | Status: DC

## 2023-01-12 NOTE — Telephone Encounter (Signed)
Will forward to provider  

## 2023-01-12 NOTE — Progress Notes (Signed)
BH MD/PA/NP OP Progress Note Virtual Visit via Video Note  I connected with [name] on 01/12/23 at 10:30 AM EDT by a video enabled telemedicine application and verified that I am speaking with the correct person using two identifiers.  Location: Patient: Home Provider: Clinic   I discussed the limitations of evaluation and management by telemedicine and the availability of in person appointments. The patient expressed understanding and agreed to proceed.  I provided 30 minutes of non-face-to-face time during this encounter.    01/12/2023 10:40 AM [name]  MRN:  [id number]  Chief Complaint: "I like the hydroxyzine"  HPI: 52 year old female seen today for follow-up psychiatric evaluation.   She has a psychiatric history of tobacco dependence (in remission) depression and prolonged grief.  Currently she is managed on Strattera 40 mg daily, Nicoderm gum, and Nicorette 7 mg patches,trazodone 100 mg nightly hydroxyzine 10 mg three times daily as needed, and Cymbalta 60 mg twice daily.  She notes her medications are effective in managing her psychiatric conditions.  Today patient was well-groomed, pleasant, cooperative, and engaged in conversation.  She informed Clinical research associate she likes the hydroxyzine as a lessens her anxiety.  Patient notes that her mood is stable and notes that her depression continues to be well-managed.  She informed Clinical research associate that at this time she is unable to do a GAD-7 or PHQ-9 as she is at a doctors appointment with her grandchild.  She reports that she will do these screenings at her next visit.  Patient does note that she is sleeping and eating well.  Today she denies SI/HI/AVH, mania, paranoia.    No medication changes made today.  Patient agreeable to continue medication as prescribed.  No other concerns noted at this time.    Visit Diagnosis:    ICD-10-CM   1. MDD (major depressive disorder), recurrent, in partial remission (HCC)  F33.41 DULoxetine (CYMBALTA) 60  MG capsule    traZODone (DESYREL) 100 MG tablet    2. 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 DULoxetine (CYMBALTA) 60 MG capsule    3. Generalized anxiety disorder  F41.1 DULoxetine (CYMBALTA) 60 MG capsule    hydrOXYzine (ATARAX) 10 MG tablet    4. Poor concentration  R41.840 atomoxetine (STRATTERA) 40 MG capsule       Past Psychiatric History:  MDD, grief with prolonged bereavement reaction, tobacco dependence  Past Medical History:  Past Medical History:  Diagnosis Date   Anxiety    Common migraine with intractable migraine 10/02/2016   Depression    Diabetes mellitus    Hypertension     Past Surgical History:  Procedure Laterality Date   CHOLECYSTECTOMY     TUBAL LIGATION      Family Psychiatric History: Denied any family psychiatric history  Family History:  Family History  Problem Relation Age of Onset   Diabetes Mother    Hypertension Mother    Renal Disease Mother    Cancer Father    Diabetes Sister    Diabetes Brother    Renal Disease Brother    Diabetes Brother    Diabetes Sister    Diabetes Brother    Diabetes Brother    Diabetes Brother    Healthy Daughter    Healthy Daughter     Social History:  Social History   Socioeconomic History   Marital status: Single    Spouse name: Not on file   Number of children: Not on file   Years of education: Not on file  Highest education level: Not on file  Occupational History   Not on file  Tobacco Use   Smoking status: Every Day    Current packs/day: 0.25    Types: Cigarettes   Smokeless tobacco: Never  Vaping Use   Vaping status: Former  Substance and Sexual Activity   Alcohol use: No   Drug use: No   Sexual activity: Not on file  Other Topics Concern   Not on file  Social History Narrative   Right handed    Lives with daughter   Caffeine use: Drinks coffee/tea/soda sometimes   Social Determinants of Health   Financial Resource Strain: Low Risk  (06/30/2022)   Overall  Financial Resource Strain (CARDIA)    Difficulty of Paying Living Expenses: Not hard at all  Food Insecurity: No Food Insecurity (06/30/2022)   Hunger Vital Sign    Worried About Running Out of Food in the Last Year: Never true    Ran Out of Food in the Last Year: Never true  Transportation Needs: No Transportation Needs (06/30/2022)   PRAPARE - Administrator, Civil Service (Medical): No    Lack of Transportation (Non-Medical): No  Physical Activity: Sufficiently Active (06/30/2022)   Exercise Vital Sign    Days of Exercise per Week: 5 days    Minutes of Exercise per Session: 30 min  Stress: No Stress Concern Present (06/30/2022)   [employer] of Occupational Health - Occupational Stress Questionnaire    Feeling of Stress : Not at all  Social Connections: Socially Integrated (06/30/2022)   Social Connection and Isolation Panel [NHANES]    Frequency of Communication with Friends and Family: More than three times a week    Frequency of Social Gatherings with Friends and Family: More than three times a week    Attends Religious Services: 1 to 4 times per year    Active Member of [employer] or Organizations: Yes    Attends Banker Meetings: 1 to 4 times per year    Marital Status: Living with partner    Allergies:  Allergies  Allergen Reactions   Lisinopril     angioedema   Trulicity [Dulaglutide] Other (See Comments)    Gas, abdominal bloating    Metabolic Disorder Labs: Lab Results  Component Value Date   HGBA1C 7.1 (A) 09/28/2022   No results found for: "PROLACTIN" Lab Results  Component Value Date   CHOL 195 05/29/2022   TRIG 103 05/29/2022   HDL 45 05/29/2022   CHOLHDL 4.3 05/29/2022   LDLCALC 131 (H) 05/29/2022   LDLCALC 158 (H) 01/07/2021   Lab Results  Component Value Date   TSH 2.870 10/02/2018   TSH 4.840 (H) 01/09/2018    Therapeutic Level Labs: No results found for: "LITHIUM" No results found for: "VALPROATE" No results found  for: "CBMZ"  Current Medications: Current Outpatient Medications  Medication Sig Dispense Refill   Accu-Chek Softclix Lancets lancets Use to check blood sugar three times daily. 100 each 6   albuterol (VENTOLIN HFA) 108 (90 Base) MCG/ACT inhaler Inhale 1-2 puffs into the lungs every 6 (six) hours as needed for wheezing or shortness of breath. 8 each 0   albuterol (VENTOLIN HFA) 108 (90 Base) MCG/ACT inhaler Inhale 1-2 puffs into the lungs every 6 (six) hours as needed for up to 14 days for wheezing or shortness of breath. 1 each 0   amLODipine (NORVASC) 10 MG tablet TAKE 1 TABLET (10 MG TOTAL) BY MOUTH DAILY. (AM) 90 tablet  0   aspirin EC 81 MG tablet Take 81 mg by mouth every 6 (six) hours as needed for mild pain.      atomoxetine (STRATTERA) 40 MG capsule Take 1 capsule (40 mg total) by mouth daily. 30 capsule 3   blood glucose meter kit and supplies Dispense based on patient and insurance preference. Use up to four times daily as directed. (FOR ICD-10 E10.9, E11.9). 1 each 0   Blood Glucose Monitoring Suppl (ACCU-CHEK GUIDE) w/Device KIT Use to check blood sugar three times daily. 1 kit 0   celecoxib (CELEBREX) 200 MG capsule TAKE ONE CAPSULE BY MOUTH TWICE DAILY (AM+BEDTIME) 180 capsule 1   Continuous Blood Gluc Receiver (FREESTYLE LIBRE 2 READER) DEVI Use to check blood sugar continuously throughout the day. Change sensors once every 14 days. E11.9 1 each 0   Continuous Blood Gluc Sensor (FREESTYLE LIBRE 2 SENSOR) MISC Use to check blood sugar continuously throughout the day. Change sensors once every 14 days. E11.9 2 each 3   DULoxetine (CYMBALTA) 60 MG capsule TAKE 1 CAPSULE (60 MG TOTAL) BY MOUTH 2 (TWO) TIMES DAILY (AM+BEDTIME) 90 capsule 3   EASY COMFORT PEN NEEDLES 31G X 8 MM MISC USE TO INJECT LEVEMIR TWICE A DAY 100 each 2   fluconazole (DIFLUCAN) 150 MG tablet Take 1 tablet (150 mg total) by mouth every 3 (three) days. (Patient not taking: Reported on 05/12/2022) 2 tablet 0    fluticasone (FLONASE) 50 MCG/ACT nasal spray PLACE 2 SPRAYS INTO BOTH NOSTRILS DAILY. 16 g 2   fluticasone (FLOVENT HFA) 44 MCG/ACT inhaler Inhale 2 puffs into the lungs 2 (two) times daily. 1 Inhaler 12   glucose blood (ACCU-CHEK GUIDE) test strip Use to check blood sugar three times daily. 100 each 6   HYDROcodone-acetaminophen (NORCO) 5-325 MG tablet Take 1 tablet by mouth every 4 (four) hours as needed for moderate pain. (Patient not taking: Reported on 05/12/2022) 10 tablet 0   hydrOXYzine (ATARAX) 10 MG tablet Take 1 tablet (10 mg total) by mouth 3 (three) times daily as needed. 90 tablet 3   insulin glargine (LANTUS SOLOSTAR) 100 UNIT/ML Solostar Pen Inject 56 Units into the skin daily. 45 mL 1   lidocaine (XYLOCAINE) 2 % jelly Place 1 Application into the urethra as needed. 30 mL 0   losartan (COZAAR) 50 MG tablet TAKE 1 TABLET (50 MG TOTAL) BY MOUTH DAILY (AM) 90 tablet 1   metFORMIN (GLUCOPHAGE) 1000 MG tablet TAKE ONE TABLET BY MOUTH TWICE DAILY (AM+BEDTIME) 60 tablet 0   metroNIDAZOLE (FLAGYL) 500 MG tablet Take 1 tablet (500 mg total) by mouth 2 (two) times daily. (Patient not taking: Reported on 09/28/2022) 14 tablet 0   nicotine (NICODERM CQ - DOSED IN MG/24 HR) 7 mg/24hr patch Place 1 patch (7 mg total) onto the skin daily. Remove before bedtime. 28 patch 5   nicotine polacrilex (NICORETTE) 2 MG gum Take 1 each (2 mg total) by mouth as needed for smoking cessation. Use every 1-2 hours as needed. 100 tablet 5   ondansetron (ZOFRAN) 4 MG tablet Take 1 tablet (4 mg total) by mouth every 8 (eight) hours as needed for nausea or vomiting. 20 tablet 0   ondansetron (ZOFRAN-ODT) 4 MG disintegrating tablet Take 1 tablet (4 mg total) by mouth every 8 (eight) hours as needed for nausea or vomiting. 20 tablet 0   pantoprazole (PROTONIX) 40 MG tablet TAKE 1 TABLET (40 MG TOTAL) BY MOUTH DAILY (AM) 30 tablet 3  pravastatin (PRAVACHOL) 40 MG tablet TAKE 1 TABLET (40 MG TOTAL) BY MOUTH DAILY (BEDTIME)  90 tablet 1   Semaglutide, 2 MG/DOSE, 8 MG/3ML SOPN Inject 2 mg as directed once a week. 9 mL 2   traZODone (DESYREL) 100 MG tablet Take 1 tablet (100 mg total) by mouth at bedtime. 30 tablet 3   No current facility-administered medications for this visit.     Musculoskeletal: Strength & Muscle Tone: within normal limits and telehealth visit Gait & Station: normal, telehealth visit Patient leans: N/A  Psychiatric Specialty Exam: Review of Systems  There were no vitals taken for this visit.There is no height or weight on file to calculate BMI.  General Appearance: Well Groomed  Eye Contact:  Good  Speech:  Clear and Coherent and Normal Rate  Volume:  Normal  Mood:  Euthymic  Affect:  Appropriate and Congruent  Thought Process:  Coherent, Goal Directed, and Linear  Orientation:  Full (Time, Place, and Person)  Thought Content: WDL and Logical   Suicidal Thoughts:  No  Homicidal Thoughts:  No  Memory:  Immediate;   Good Recent;   Good Remote;   Good  Judgement:  Good  Insight:  Good  Psychomotor Activity:  Normal  Concentration:  Concentration: Good and Attention Span: Good  Recall:  Good  Fund of Knowledge: Good  Language: Good  Akathisia:  No  Handed:  Right  AIMS (if indicated): not done  Assets:  Communication Skills Desire for Improvement Financial Resources/Insurance Housing Leisure Time Physical Health Social Support  ADL's:  Intact  Cognition: WNL  Sleep:  Good   Screenings: GAD-7    Flowsheet Row Video Visit from 10/13/2022 in [facility] Video Visit from 08/04/2022 in [facility] Video Visit from 05/12/2022 in [facility] Video Visit from 10/26/2021 in [facility] Video Visit from 05/19/2021 in [facility]  Total GAD-7 Score 11 9 6 [phone] 18       PHQ2-9    Flowsheet Row Video Visit from 10/13/2022 in [facility] Video Visit from 08/04/2022 in [facility] Office Visit from 06/30/2022 in [facility] Video Visit from 05/12/2022 in [facility] Video Visit from 10/26/2021 in [facility]  PHQ-2 Total Score 2 0 3 3 0  PHQ-9 Total Score 11 3 10 10 6       Flowsheet Row ED from 07/07/2022 in [facility] ED from 12/19/2021 in [facility] at [facility] ED from 08/10/2021 in [facility] Emergency Department at [facility]  C-SSRS RISK CATEGORY No Risk No Risk No Risk        Assessment and Plan: Patient reports that her anxiety and sleep has improved since starting hydroxyzine. No medication changes made today. Patient agreeable to continue medications as prescribed.   1. MDD (major depressive disorder), recurrent, in partial remission (HCC)  Continue- DULoxetine (CYMBALTA) 60 MG capsule; TAKE 1 CAPSULE (60 MG TOTAL) BY MOUTH 2 (TWO) TIMES DAILY (AM+BEDTIME)  Dispense: 90 capsule; Refill: 3 Continue- traZODone (DESYREL) 100 MG tablet; Take 1 tablet (100 mg total) by mouth at bedtime.  Dispense: 30 tablet; Refill: 3  2. Grief reaction with prolonged bereavement  Continue- DULoxetine (CYMBALTA) 60 MG capsule; TAKE 1 CAPSULE (60 MG TOTAL) BY MOUTH 2 (TWO) TIMES DAILY (AM+BEDTIME)  Dispense: 90 capsule; Refill:  3  3. Generalized anxiety disorder  Continue- DULoxetine (CYMBALTA) 60 MG capsule; TAKE 1 CAPSULE (60 MG TOTAL) BY MOUTH 2 (TWO) TIMES DAILY (AM+BEDTIME)  Dispense: 90 capsule; Refill: 3 Continue- hydrOXYzine (ATARAX) 10 MG tablet; Take 1 tablet (10 mg total) by mouth 3 (three) times daily as needed.  Dispense: 90 tablet; Refill: 3  4. Poor concentration  Continue- atomoxetine (STRATTERA) 40 MG capsule; Take 1 capsule (40 mg total) by mouth daily.  Dispense: 30 capsule; Refill: 3  Follow-up in 2 months Shanna Cisco, NP 01/12/2023, 10:40 AM

## 2023-01-23 NOTE — Telephone Encounter (Signed)
Needs appt

## 2023-02-15 ENCOUNTER — Other Ambulatory Visit (INDEPENDENT_AMBULATORY_CARE_PROVIDER_SITE_OTHER): Payer: Self-pay | Admitting: Primary Care

## 2023-02-15 DIAGNOSIS — I10 Essential (primary) hypertension: Secondary | ICD-10-CM

## 2023-02-15 DIAGNOSIS — K219 Gastro-esophageal reflux disease without esophagitis: Secondary | ICD-10-CM

## 2023-02-26 ENCOUNTER — Emergency Department (HOSPITAL_COMMUNITY)
Admission: EM | Admit: 2023-02-26 | Discharge: 2023-02-26 | Disposition: A | Discharge status: Home / Self Care | Payer: Medicaid Other

## 2023-02-26 ENCOUNTER — Other Ambulatory Visit: Payer: Self-pay

## 2023-02-26 ENCOUNTER — Encounter (HOSPITAL_COMMUNITY): Payer: Self-pay | Admitting: Emergency Medicine

## 2023-02-26 DIAGNOSIS — Z794 Long term (current) use of insulin: Secondary | ICD-10-CM | POA: Diagnosis not present

## 2023-02-26 DIAGNOSIS — M791 Myalgia, unspecified site: Secondary | ICD-10-CM | POA: Insufficient documentation

## 2023-02-26 DIAGNOSIS — R059 Cough, unspecified: Secondary | ICD-10-CM | POA: Diagnosis present

## 2023-02-26 DIAGNOSIS — Z79899 Other long term (current) drug therapy: Secondary | ICD-10-CM | POA: Diagnosis not present

## 2023-02-26 DIAGNOSIS — J069 Acute upper respiratory infection, unspecified: Secondary | ICD-10-CM | POA: Diagnosis not present

## 2023-02-26 DIAGNOSIS — I10 Essential (primary) hypertension: Secondary | ICD-10-CM | POA: Insufficient documentation

## 2023-02-26 DIAGNOSIS — E119 Type 2 diabetes mellitus without complications: Secondary | ICD-10-CM | POA: Diagnosis not present

## 2023-02-26 DIAGNOSIS — J0100 Acute maxillary sinusitis, unspecified: Secondary | ICD-10-CM

## 2023-02-26 DIAGNOSIS — Z20822 Contact with and (suspected) exposure to covid-19: Secondary | ICD-10-CM | POA: Diagnosis not present

## 2023-02-26 DIAGNOSIS — B9789 Other viral agents as the cause of diseases classified elsewhere: Secondary | ICD-10-CM | POA: Diagnosis not present

## 2023-02-26 LAB — SARS CORONAVIRUS 2 BY RT PCR: SARS Coronavirus 2 by RT PCR: NEGATIVE

## 2023-02-26 MED ORDER — OXYMETAZOLINE HCL 0.05 % NA SOLN
1.0000 | Freq: Once | NASAL | Status: AC
  Administered 2023-02-26: 1 via NASAL
  Filled 2023-02-26: qty 30

## 2023-02-26 MED ORDER — ACETAMINOPHEN 500 MG PO TABS
1000.0000 mg | ORAL_TABLET | Freq: Once | ORAL | Status: AC
  Administered 2023-02-26: 1000 mg via ORAL
  Filled 2023-02-26: qty 2

## 2023-02-26 MED ORDER — AMOXICILLIN-POT CLAVULANATE 875-125 MG PO TABS: 1.0000 | ORAL_TABLET | Freq: Two times a day (BID) | ORAL | 0 refills | Status: AC

## 2023-02-26 NOTE — ED Provider Notes (Signed)
[place] EMERGENCY DEPARTMENT AT [facility] Provider Note   CSN: [id number] Arrival date & time: 02/26/23  1102     History  Chief Complaint  Patient presents with   Nasal Congestion   Generalized Body Aches    [name] is a 52 y.o. female with PMHx migraine, anxiety, depression, DM, HTN who presents to ED concerned for subjective fever, congestion, dry cough, body aches x3 days. Patient has taken Claritin, Nyquil, Alka Seltzer without relief.   Denies fever, rhinorrhea, chest pain, dyspnea, nausea, vomiting, diarrhea.   HPI     Home Medications Prior to Admission medications   Medication Sig Start Date End Date Taking? Authorizing Provider  amoxicillin-clavulanate (AUGMENTIN) 875-125 MG tablet Take 1 tablet by mouth every 12 (twelve) hours for 7 days. 02/28/23 03/07/23 Yes Valrie Hart F, PA-C  Accu-Chek Softclix Lancets lancets Use to check blood sugar three times daily. 08/04/22   Hoy Register, MD  albuterol (VENTOLIN HFA) 108 (90 Base) MCG/ACT inhaler Inhale 1-2 puffs into the lungs every 6 (six) hours as needed for wheezing or shortness of breath. 11/05/20   Rushie Chestnut, PA-C  albuterol (VENTOLIN HFA) 108 (90 Base) MCG/ACT inhaler Inhale 1-2 puffs into the lungs every 6 (six) hours as needed for up to 14 days for wheezing or shortness of breath. 06/07/21 06/21/21  Leath-Warren, Sadie Haber, NP  amLODipine (NORVASC) 10 MG tablet TAKE 1 TABLET (10 MG TOTAL) BY MOUTH DAILY. (AM) 02/16/23   Grayce Sessions, NP  aspirin EC 81 MG tablet Take 81 mg by mouth every 6 (six) hours as needed for mild pain.     [provider]  atomoxetine (STRATTERA) 40 MG capsule Take 1 capsule (40 mg total) by mouth daily. 01/12/23   Shanna Cisco, NP  blood glucose meter kit and supplies Dispense based on patient and insurance preference. Use up to four times daily as directed. (FOR ICD-10 E10.9, E11.9). 01/05/20   Grayce Sessions, NP  Blood Glucose  Monitoring Suppl (ACCU-CHEK GUIDE) w/Device KIT Use to check blood sugar three times daily. 08/04/22   Hoy Register, MD  celecoxib (CELEBREX) 200 MG capsule TAKE ONE CAPSULE BY MOUTH TWICE DAILY (AM+BEDTIME) 10/06/22   Grayce Sessions, NP  Continuous Blood Gluc Receiver (FREESTYLE LIBRE 2 READER) DEVI Use to check blood sugar continuously throughout the day. Change sensors once every 14 days. E11.9 06/30/22   Hoy Register, MD  Continuous Blood Gluc Sensor (FREESTYLE LIBRE 2 SENSOR) MISC Use to check blood sugar continuously throughout the day. Change sensors once every 14 days. E11.9 06/30/22   Hoy Register, MD  DULoxetine (CYMBALTA) 60 MG capsule TAKE 1 CAPSULE (60 MG TOTAL) BY MOUTH 2 (TWO) TIMES DAILY (AM+BEDTIME) 01/12/23   Toy Cookey E, NP  EASY COMFORT PEN NEEDLES 31G X 8 MM MISC USE TO INJECT LEVEMIR TWICE A DAY 10/26/21   Hoy Register, MD  fluconazole (DIFLUCAN) 150 MG tablet Take 1 tablet (150 mg total) by mouth every 3 (three) days. Patient not taking: Reported on 05/12/2022 12/19/21   Merrilee Jansky, MD  fluticasone [facility]) 50 MCG/ACT nasal spray PLACE 2 SPRAYS INTO BOTH NOSTRILS DAILY. 10/17/22   Grayce Sessions, NP  fluticasone (FLOVENT HFA) 44 MCG/ACT inhaler Inhale 2 puffs into the lungs 2 (two) times daily. 01/19/19   Grayce Sessions, NP  gabapentin (NEURONTIN) 300 MG capsule TAKE ONE CAPSULE BY MOUTH THREE TIMES DAILY ( AM+NOON+BEDTIME) 01/23/23   Grayce Sessions, NP  glucose blood (  ACCU-CHEK GUIDE) test strip Use to check blood sugar three times daily. 08/04/22   Hoy Register, MD  HYDROcodone-acetaminophen (NORCO) 5-325 MG tablet Take 1 tablet by mouth every 4 (four) hours as needed for moderate pain. Patient not taking: Reported on 05/12/2022 07/06/21   Dione Booze, MD  hydrOXYzine (ATARAX) 10 MG tablet Take 1 tablet (10 mg total) by mouth 3 (three) times daily as needed. 01/12/23   Toy Cookey E, NP  insulin glargine (LANTUS SOLOSTAR) 100 UNIT/ML  Solostar Pen Inject 56 Units into the skin daily. 09/28/22   Grayce Sessions, NP  lidocaine (XYLOCAINE) 2 % jelly Place 1 Application into the urethra as needed. 12/19/21   Merrilee Jansky, MD  losartan (COZAAR) 50 MG tablet TAKE 1 TABLET (50 MG TOTAL) BY MOUTH DAILY (AM) 09/28/22   Grayce Sessions, NP  metFORMIN (GLUCOPHAGE) 1000 MG tablet TAKE ONE TABLET BY MOUTH TWICE DAILY (AM+BEDTIME) 01/23/23   Grayce Sessions, NP  nicotine (NICODERM CQ - DOSED IN MG/24 HR) 7 mg/24hr patch Place 1 patch (7 mg total) onto the skin daily. Remove before bedtime. 01/12/23   Shanna Cisco, NP  nicotine polacrilex (NICORETTE) 2 MG gum Take 1 each (2 mg total) by mouth as needed for smoking cessation. Use every 1-2 hours as needed. 01/12/23   Shanna Cisco, NP  ondansetron (ZOFRAN) 4 MG tablet Take 1 tablet (4 mg total) by mouth every 8 (eight) hours as needed for nausea or vomiting. 02/15/21   Hoy Register, MD  ondansetron (ZOFRAN-ODT) 4 MG disintegrating tablet Take 1 tablet (4 mg total) by mouth every 8 (eight) hours as needed for nausea or vomiting. 07/07/22   Carlisle Beers, FNP  pantoprazole (PROTONIX) 40 MG tablet TAKE 1 TABLET (40 MG TOTAL) BY MOUTH DAILY (AM) 02/16/23   Grayce Sessions, NP  pravastatin (PRAVACHOL) 40 MG tablet TAKE 1 TABLET (40 MG TOTAL) BY MOUTH DAILY (BEDTIME) 10/06/22   Grayce Sessions, NP  Semaglutide, 2 MG/DOSE, 8 MG/3ML SOPN Inject 2 mg as directed once a week. 08/04/22   Hoy Register, MD  traZODone (DESYREL) 100 MG tablet Take 1 tablet (100 mg total) by mouth at bedtime. 01/12/23   Shanna Cisco, NP      Allergies    Lisinopril and Trulicity [dulaglutide]    Review of Systems   Review of Systems  HENT:  Positive for congestion.     Physical Exam Updated Vital Signs BP (!) 155/100   Pulse 85   Temp 98.4 F (36.9 C) (Oral)   Resp 16   SpO2 97%  Physical Exam Vitals and nursing note reviewed.  Constitutional:      General: She is  not in acute distress.    Appearance: She is not ill-appearing or toxic-appearing.  HENT:     Head: Normocephalic and atraumatic.     Comments: Tenderness to palpation of maxillary sinuses.    Mouth/Throat:     Mouth: Mucous membranes are moist.  Eyes:     General: No scleral icterus.       Right eye: No discharge.        Left eye: No discharge.     Conjunctiva/sclera: Conjunctivae normal.  Cardiovascular:     Rate and Rhythm: Normal rate and regular rhythm.     Pulses: Normal pulses.     Heart sounds: Normal heart sounds. No murmur heard. Pulmonary:     Effort: Pulmonary effort is normal. No respiratory distress.  Breath sounds: Normal breath sounds. No wheezing, rhonchi or rales.  Abdominal:     General: Abdomen is flat.  Musculoskeletal:     Right lower leg: No edema.     Left lower leg: No edema.  Skin:    General: Skin is warm and dry.     Findings: No rash.  Neurological:     General: No focal deficit present.     Mental Status: She is alert. Mental status is at baseline.  Psychiatric:        Mood and Affect: Mood normal.        Behavior: Behavior normal.     ED Results / Procedures / Treatments   Labs (all labs ordered are listed, but only abnormal results are displayed) Labs Reviewed  SARS CORONAVIRUS 2 BY RT PCR    EKG None  Radiology No results found.  Procedures Procedures    Medications Ordered in ED Medications  oxymetazoline (AFRIN) 0.05 % nasal spray 1 spray (1 spray Each Nare Given 02/26/23 1250)  acetaminophen (TYLENOL) tablet 1,000 mg (1,000 mg Oral Given 02/26/23 1250)    ED Course/ Medical Decision Making/ A&P                                 Medical Decision Making Risk OTC drugs. Prescription drug management.    This patient presents to the ED for concern of congestion, cough, body aches, this involves an extensive number of treatment options, and is a complaint that carries with it a high risk of complications and  morbidity.  The differential diagnosis includes Flu/COVID/RSV, sinusitis,pneumonia, meningitis.   Co morbidities that complicate the patient evaluation  migraine, anxiety, depression, DM, HTN   Additional history obtained:  PCP with Randa Evens, NP   Lab Tests:  I Ordered, and personally interpreted labs.  The pertinent results include:   COVID: negative   Problem List / ED Course / Critical interventions / Medication management  Patient presents to ED concern for subjective fevers, congestion, sinus pain, dry cough, body aches x 3 days.  Physical exam with mild tenderness palpation of maxillary sinuses.  Rest of physical exam unremarkable.  Patient afebrile with stable vitals. Provided patient with Afrin and Tylenol in the ED which helped resolve symptoms.  Educated patient to not take Afrin for more than 2-3 days.  Will also start "wait and see" ABX with patient incase her sinus pain does not start resolving over the next couple of days.  Recommend following up with PCP.  Patient verbalized understanding of plan. Patient was educated on alternating between 650 mg Tylenol and 400 mg ibuprofen every 3 hours as needed for pain and the benefit that honey may provide for cough symptoms.  I have reviewed the patients home medicines and have made adjustments as needed Patient afebrile with stable vitals.  Provided with return precautions.  Discharged in good condition.   DDx: These are considered less likely due to history of present illness and physical exam findings -PNA: Lungs clear to auscultation bilaterally -Meningitis: patient's symptoms, vital signs, physical exam findings including lack of meningismus seem grossly less consistent at this time   Social Determinants of Health:  none          Final Clinical Impression(s) / ED Diagnoses Final diagnoses:  Viral upper respiratory tract infection  Acute non-recurrent maxillary sinusitis    Rx / DC Orders ED Discharge  Orders  Ordered    amoxicillin-clavulanate (AUGMENTIN) 875-125 MG tablet  Every 12 hours       Note to Pharmacy: "Wait and see"   02/26/23 1316              Dorthy Cooler, [place] 02/26/23 1323    Durwin Glaze, MD 02/26/23 [phone]

## 2023-02-26 NOTE — ED Triage Notes (Addendum)
Patient presents due to congestion, facial pain and general body aches. She says she is so congested, it is hard to breath. Symptoms began 4 days ago and has worsened since. Patient has taken OTC cold and flu medication, but nothing has helped.

## 2023-02-26 NOTE — Discharge Instructions (Addendum)
Was a pleasure caring for you today.  As discussed, do not use your nasal spray more than twice daily. Do not use this nasal spray for more than 2 days. If symptoms are not appropriately resolving, you may obtain antibiotics using your printed prescription. Of note, honey may provide further cough relief as it coats your throat and suppresses throat irritation that leads to cough. Please follow up with primary care and seek emergency care if experiencing any new or worsening symptoms.   Alternating between 650 mg Tylenol and 400 mg Advil: The best way to alternate taking Acetaminophen (example Tylenol) and Ibuprofen (example Advil/Motrin) is to take them 3 hours apart. For example, if you take ibuprofen at 6 am you can then take Tylenol at 9 am. You can continue this regimen throughout the day, making sure you do not exceed the recommended maximum dose for each drug.

## 2023-03-13 ENCOUNTER — Emergency Department (HOSPITAL_COMMUNITY)
Admission: EM | Admit: 2023-03-13 | Discharge: 2023-03-14 | Disposition: A | Discharge status: Home / Self Care | Payer: Medicaid Other | Attending: Emergency Medicine | Admitting: Emergency Medicine

## 2023-03-13 ENCOUNTER — Other Ambulatory Visit: Payer: Self-pay

## 2023-03-13 DIAGNOSIS — Z79899 Other long term (current) drug therapy: Secondary | ICD-10-CM | POA: Diagnosis not present

## 2023-03-13 DIAGNOSIS — R109 Unspecified abdominal pain: Secondary | ICD-10-CM

## 2023-03-13 DIAGNOSIS — Z794 Long term (current) use of insulin: Secondary | ICD-10-CM | POA: Insufficient documentation

## 2023-03-13 DIAGNOSIS — K2970 Gastritis, unspecified, without bleeding: Secondary | ICD-10-CM | POA: Diagnosis not present

## 2023-03-13 DIAGNOSIS — N83201 Unspecified ovarian cyst, right side: Secondary | ICD-10-CM | POA: Diagnosis not present

## 2023-03-13 DIAGNOSIS — E119 Type 2 diabetes mellitus without complications: Secondary | ICD-10-CM | POA: Insufficient documentation

## 2023-03-13 DIAGNOSIS — N838 Other noninflammatory disorders of ovary, fallopian tube and broad ligament: Secondary | ICD-10-CM | POA: Diagnosis not present

## 2023-03-13 DIAGNOSIS — K5730 Diverticulosis of large intestine without perforation or abscess without bleeding: Secondary | ICD-10-CM | POA: Diagnosis not present

## 2023-03-13 DIAGNOSIS — Z7982 Long term (current) use of aspirin: Secondary | ICD-10-CM | POA: Insufficient documentation

## 2023-03-13 DIAGNOSIS — Z7984 Long term (current) use of oral hypoglycemic drugs: Secondary | ICD-10-CM | POA: Diagnosis not present

## 2023-03-13 DIAGNOSIS — N83209 Unspecified ovarian cyst, unspecified side: Secondary | ICD-10-CM

## 2023-03-13 DIAGNOSIS — R102 Pelvic and perineal pain: Secondary | ICD-10-CM | POA: Diagnosis not present

## 2023-03-13 DIAGNOSIS — I10 Essential (primary) hypertension: Secondary | ICD-10-CM | POA: Insufficient documentation

## 2023-03-13 DIAGNOSIS — D259 Leiomyoma of uterus, unspecified: Secondary | ICD-10-CM | POA: Diagnosis not present

## 2023-03-13 DIAGNOSIS — K760 Fatty (change of) liver, not elsewhere classified: Secondary | ICD-10-CM | POA: Diagnosis not present

## 2023-03-13 LAB — CBC
HCT: 39.2 % (ref 36.0–46.0)
Hemoglobin: 13.6 g/dL (ref 12.0–15.0)
MCH: 25.8 pg — ABNORMAL LOW (ref 26.0–34.0)
MCHC: 34.7 g/dL (ref 30.0–36.0)
MCV: 74.2 fL — ABNORMAL LOW (ref 80.0–100.0)
Platelets: 273 10*3/uL (ref 150–400)
RBC: 5.28 MIL/uL — ABNORMAL HIGH (ref 3.87–5.11)
RDW: 14.1 % (ref 11.5–15.5)
WBC: 13.9 10*3/uL — ABNORMAL HIGH (ref 4.0–10.5)
nRBC: 0 % (ref 0.0–0.2)

## 2023-03-13 LAB — URINALYSIS, ROUTINE W REFLEX MICROSCOPIC
Bacteria, UA: NONE SEEN
Bilirubin Urine: NEGATIVE
Glucose, UA: NEGATIVE mg/dL
Ketones, ur: NEGATIVE mg/dL
Leukocytes,Ua: NEGATIVE
Nitrite: NEGATIVE
Protein, ur: 30 mg/dL — AB
Specific Gravity, Urine: 1.025 (ref 1.005–1.030)
pH: 5 (ref 5.0–8.0)

## 2023-03-13 LAB — COMPREHENSIVE METABOLIC PANEL
ALT: 37 U/L (ref 0–44)
AST: 28 U/L (ref 15–41)
Albumin: 3.5 g/dL (ref 3.5–5.0)
Alkaline Phosphatase: 153 U/L — ABNORMAL HIGH (ref 38–126)
Anion gap: 9 (ref 5–15)
BUN: 11 mg/dL (ref 6–20)
CO2: 22 mmol/L (ref 22–32)
Calcium: 9.2 mg/dL (ref 8.9–10.3)
Chloride: 106 mmol/L (ref 98–111)
Creatinine, Ser: 0.85 mg/dL (ref 0.44–1.00)
GFR, Estimated: >60 mL/min (ref >60)
Glucose, Bld: 250 mg/dL — ABNORMAL HIGH (ref 70–99)
Potassium: 3.6 mmol/L (ref 3.5–5.1)
Sodium: 137 mmol/L (ref 135–145)
Total Bilirubin: 0.2 mg/dL (ref <1.2)
Total Protein: 7.5 g/dL (ref 6.5–8.1)

## 2023-03-13 LAB — LIPASE, BLOOD: Lipase: 131 U/L — ABNORMAL HIGH (ref 11–51)

## 2023-03-13 NOTE — ED Triage Notes (Signed)
Pt states that she has had constant abdominal pain that she describes as generalized and pressure. Endorses nausea but denies vomiting. Denies diarrhea/constipation.

## 2023-03-14 ENCOUNTER — Emergency Department (HOSPITAL_COMMUNITY): Payer: Medicaid Other

## 2023-03-14 DIAGNOSIS — R102 Pelvic and perineal pain: Secondary | ICD-10-CM | POA: Diagnosis not present

## 2023-03-14 DIAGNOSIS — N83201 Unspecified ovarian cyst, right side: Secondary | ICD-10-CM | POA: Diagnosis not present

## 2023-03-14 DIAGNOSIS — K760 Fatty (change of) liver, not elsewhere classified: Secondary | ICD-10-CM | POA: Diagnosis not present

## 2023-03-14 DIAGNOSIS — D259 Leiomyoma of uterus, unspecified: Secondary | ICD-10-CM | POA: Diagnosis not present

## 2023-03-14 DIAGNOSIS — K5730 Diverticulosis of large intestine without perforation or abscess without bleeding: Secondary | ICD-10-CM | POA: Diagnosis not present

## 2023-03-14 DIAGNOSIS — N838 Other noninflammatory disorders of ovary, fallopian tube and broad ligament: Secondary | ICD-10-CM | POA: Diagnosis not present

## 2023-03-14 DIAGNOSIS — R109 Unspecified abdominal pain: Secondary | ICD-10-CM | POA: Diagnosis not present

## 2023-03-14 LAB — DIFFERENTIAL
Abs Immature Granulocytes: 0.04 10*3/uL (ref 0.00–0.07)
Basophils Absolute: 0 10*3/uL (ref 0.0–0.1)
Basophils Relative: 0 %
Eosinophils Absolute: 0.1 10*3/uL (ref 0.0–0.5)
Eosinophils Relative: 1 %
Immature Granulocytes: 0 %
Lymphocytes Relative: 30 %
Lymphs Abs: 4.5 10*3/uL — ABNORMAL HIGH (ref 0.7–4.0)
Monocytes Absolute: 0.9 10*3/uL (ref 0.1–1.0)
Monocytes Relative: 6 %
Neutro Abs: 9.4 10*3/uL — ABNORMAL HIGH (ref 1.7–7.7)
Neutrophils Relative %: 63 %

## 2023-03-14 MED ORDER — PANTOPRAZOLE SODIUM 40 MG PO TBEC: 40.0000 mg | DELAYED_RELEASE_TABLET | Freq: Every day | ORAL | 0 refills | Status: DC

## 2023-03-14 MED ORDER — DICYCLOMINE HCL 20 MG PO TABS: 20.0000 mg | ORAL_TABLET | Freq: Two times a day (BID) | ORAL | 0 refills | Status: AC

## 2023-03-14 MED ORDER — ONDANSETRON HCL 4 MG/2ML IJ SOLN
4.0000 mg | Freq: Once | INTRAMUSCULAR | Status: AC
  Administered 2023-03-14: 4 mg via INTRAVENOUS
  Filled 2023-03-14: qty 2

## 2023-03-14 MED ORDER — IOHEXOL 300 MG/ML  SOLN
100.0000 mL | Freq: Once | INTRAMUSCULAR | Status: AC | PRN
  Administered 2023-03-14: 100 mL via INTRAVENOUS

## 2023-03-14 MED ORDER — ONDANSETRON 4 MG PO TBDP: 4.0000 mg | ORAL_TABLET | Freq: Three times a day (TID) | ORAL | 0 refills | Status: DC | PRN

## 2023-03-14 MED ORDER — SODIUM CHLORIDE 0.9 % IV BOLUS
500.0000 mL | Freq: Once | INTRAVENOUS | Status: AC
  Administered 2023-03-14: 500 mL via INTRAVENOUS

## 2023-03-14 MED ORDER — MORPHINE SULFATE (PF) 4 MG/ML IV SOLN
4.0000 mg | Freq: Once | INTRAVENOUS | Status: AC
  Administered 2023-03-14: 4 mg via INTRAVENOUS
  Filled 2023-03-14: qty 1

## 2023-03-14 MED ORDER — MORPHINE SULFATE (PF) 2 MG/ML IV SOLN
2.0000 mg | Freq: Once | INTRAVENOUS | Status: AC
Start: 2023-03-14 — End: 2023-03-14
  Administered 2023-03-14: 2 mg via INTRAVENOUS
  Filled 2023-03-14: qty 1

## 2023-03-14 NOTE — Discharge Instructions (Addendum)
Follow-up with your gynecologist regarding the ovarian cyst.  Should have a repeat ultrasound done in 6 weeks.  I have prescribed you Protonix for the potential gastritis.  Of also given you Zofran and Bentyl.  Return for any concerning symptoms.  Follow-up with your PCP.

## 2023-03-14 NOTE — ED Provider Notes (Signed)
[place] EMERGENCY DEPARTMENT AT [facility] Provider Note   CSN: [id number] Arrival date & time: 03/13/23  1936     History  Chief Complaint  Patient presents with   Abdominal Pain    [name] is a 52 y.o. female.  52 year old female presents today for concern of abdominal pain that has been ongoing since Saturday.  Endorses nausea but no vomiting.  Denies diarrhea.  Endorses melanotic stools but this started after she took Pepto-Bismol.  No history of GI bleed.  Not on anticoagulation.  Denies any history of abdominal surgeries.  Husband is at bedside.  Denies UTI symptoms.  Denies marijuana use.  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  Last drink was on Thanksgiving day.   The history is provided by the patient. No language interpreter was used.       Home Medications Prior to Admission medications   Medication Sig Start Date End Date Taking? Authorizing Provider  Accu-Chek Softclix Lancets lancets Use to check blood sugar three times daily. 08/04/22   Hoy Register, MD  albuterol (VENTOLIN HFA) 108 (90 Base) MCG/ACT inhaler Inhale 1-2 puffs into the lungs every 6 (six) hours as needed for wheezing or shortness of breath. 11/05/20   Rushie Chestnut, PA-C  albuterol (VENTOLIN HFA) 108 (90 Base) MCG/ACT inhaler Inhale 1-2 puffs into the lungs every 6 (six) hours as needed for up to 14 days for wheezing or shortness of breath. 06/07/21 06/21/21  Leath-Warren, Sadie Haber, NP  amLODipine (NORVASC) 10 MG tablet TAKE 1 TABLET (10 MG TOTAL) BY MOUTH DAILY. (AM) 02/16/23   Grayce Sessions, NP  aspirin EC 81 MG tablet Take 81 mg by mouth every 6 (six) hours as needed for mild pain.     [provider]  atomoxetine (STRATTERA) 40 MG capsule Take 1 capsule (40 mg total) by mouth daily. 01/12/23   Shanna Cisco, NP  blood glucose meter kit and supplies Dispense based on patient and insurance preference. Use up to four times daily as directed. (FOR  ICD-10 E10.9, E11.9). 01/05/20   Grayce Sessions, NP  Blood Glucose Monitoring Suppl (ACCU-CHEK GUIDE) w/Device KIT Use to check blood sugar three times daily. 08/04/22   Hoy Register, MD  celecoxib (CELEBREX) 200 MG capsule TAKE ONE CAPSULE BY MOUTH TWICE DAILY (AM+BEDTIME) 10/06/22   Grayce Sessions, NP  Continuous Blood Gluc Receiver (FREESTYLE LIBRE 2 READER) DEVI Use to check blood sugar continuously throughout the day. Change sensors once every 14 days. E11.9 06/30/22   Hoy Register, MD  Continuous Blood Gluc Sensor (FREESTYLE LIBRE 2 SENSOR) MISC Use to check blood sugar continuously throughout the day. Change sensors once every 14 days. E11.9 06/30/22   Hoy Register, MD  DULoxetine (CYMBALTA) 60 MG capsule TAKE 1 CAPSULE (60 MG TOTAL) BY MOUTH 2 (TWO) TIMES DAILY (AM+BEDTIME) 01/12/23   Toy Cookey E, NP  EASY COMFORT PEN NEEDLES 31G X 8 MM MISC USE TO INJECT LEVEMIR TWICE A DAY 10/26/21   Hoy Register, MD  fluconazole (DIFLUCAN) 150 MG tablet Take 1 tablet (150 mg total) by mouth every 3 (three) days. Patient not taking: Reported on 05/12/2022 12/19/21   Merrilee Jansky, MD  fluticasone [facility]) 50 MCG/ACT nasal spray PLACE 2 SPRAYS INTO BOTH NOSTRILS DAILY. 10/17/22   Grayce Sessions, NP  fluticasone (FLOVENT HFA) 44 MCG/ACT inhaler Inhale 2 puffs into the lungs 2 (two) times daily. 01/19/19   Grayce Sessions, NP  gabapentin (NEURONTIN)  300 MG capsule TAKE ONE CAPSULE BY MOUTH THREE TIMES DAILY ( AM+NOON+BEDTIME) 01/23/23   Grayce Sessions, NP  glucose blood (ACCU-CHEK GUIDE) test strip Use to check blood sugar three times daily. 08/04/22   Hoy Register, MD  HYDROcodone-acetaminophen (NORCO) 5-325 MG tablet Take 1 tablet by mouth every 4 (four) hours as needed for moderate pain. Patient not taking: Reported on 05/12/2022 07/06/21   Dione Booze, MD  hydrOXYzine (ATARAX) 10 MG tablet Take 1 tablet (10 mg total) by mouth 3 (three) times daily as needed. 01/12/23    Toy Cookey E, NP  insulin glargine (LANTUS SOLOSTAR) 100 UNIT/ML Solostar Pen Inject 56 Units into the skin daily. 09/28/22   Grayce Sessions, NP  lidocaine (XYLOCAINE) 2 % jelly Place 1 Application into the urethra as needed. 12/19/21   Merrilee Jansky, MD  losartan (COZAAR) 50 MG tablet TAKE 1 TABLET (50 MG TOTAL) BY MOUTH DAILY (AM) 09/28/22   Grayce Sessions, NP  metFORMIN (GLUCOPHAGE) 1000 MG tablet TAKE ONE TABLET BY MOUTH TWICE DAILY (AM+BEDTIME) 01/23/23   Grayce Sessions, NP  nicotine (NICODERM CQ - DOSED IN MG/24 HR) 7 mg/24hr patch Place 1 patch (7 mg total) onto the skin daily. Remove before bedtime. 01/12/23   Shanna Cisco, NP  nicotine polacrilex (NICORETTE) 2 MG gum Take 1 each (2 mg total) by mouth as needed for smoking cessation. Use every 1-2 hours as needed. 01/12/23   Shanna Cisco, NP  ondansetron (ZOFRAN) 4 MG tablet Take 1 tablet (4 mg total) by mouth every 8 (eight) hours as needed for nausea or vomiting. 02/15/21   Hoy Register, MD  ondansetron (ZOFRAN-ODT) 4 MG disintegrating tablet Take 1 tablet (4 mg total) by mouth every 8 (eight) hours as needed for nausea or vomiting. 07/07/22   Carlisle Beers, FNP  pantoprazole (PROTONIX) 40 MG tablet TAKE 1 TABLET (40 MG TOTAL) BY MOUTH DAILY (AM) 02/16/23   Grayce Sessions, NP  pravastatin (PRAVACHOL) 40 MG tablet TAKE 1 TABLET (40 MG TOTAL) BY MOUTH DAILY (BEDTIME) 10/06/22   Grayce Sessions, NP  Semaglutide, 2 MG/DOSE, 8 MG/3ML SOPN Inject 2 mg as directed once a week. 08/04/22   Hoy Register, MD  traZODone (DESYREL) 100 MG tablet Take 1 tablet (100 mg total) by mouth at bedtime. 01/12/23   Shanna Cisco, NP      Allergies    Lisinopril and Trulicity [dulaglutide]    Review of Systems   Review of Systems  Constitutional:  Negative for chills and fever.  Gastrointestinal:  Positive for abdominal pain and nausea. Negative for blood in stool, constipation, diarrhea and vomiting.   Genitourinary:  Negative for difficulty urinating, dysuria and flank pain.  Neurological:  Negative for light-headedness.  All other systems reviewed and are negative.   Physical Exam Updated Vital Signs BP (!) 152/72 (BP Location: Right Arm)   Pulse 84   Temp 98.7 F (37.1 C) (Oral)   Resp 18   Ht 5' 5.5" (1.664 m)   Wt 104.3 kg   SpO2 100%   BMI 37.69 kg/m  Physical Exam Vitals and nursing note reviewed.  Constitutional:      General: She is not in acute distress.    Appearance: Normal appearance. She is not ill-appearing.  HENT:     Head: Normocephalic and atraumatic.     Nose: Nose normal.  Eyes:     General: No scleral icterus.    Extraocular Movements: Extraocular movements intact.  Conjunctiva/sclera: Conjunctivae normal.  Cardiovascular:     Rate and Rhythm: Normal rate and regular rhythm.     Pulses: Normal pulses.     Heart sounds: Normal heart sounds.  Pulmonary:     Effort: Pulmonary effort is normal. No respiratory distress.     Breath sounds: Normal breath sounds. No wheezing or rales.  Abdominal:     General: There is no distension.     Palpations: Abdomen is soft.     Tenderness: There is no abdominal tenderness. There is no guarding.  Musculoskeletal:        General: Normal range of motion.     Cervical back: Normal range of motion.  Skin:    General: Skin is warm and dry.  Neurological:     General: No focal deficit present.     Mental Status: She is alert. Mental status is at baseline.     ED Results / Procedures / Treatments   Labs (all labs ordered are listed, but only abnormal results are displayed) Labs Reviewed  LIPASE, BLOOD - Abnormal; Notable for the following components:      Result Value   Lipase 131 (*)    All other components within normal limits  COMPREHENSIVE METABOLIC PANEL - Abnormal; Notable for the following components:   Glucose, Bld 250 (*)    Alkaline Phosphatase 153 (*)    All other components within normal  limits  CBC - Abnormal; Notable for the following components:   WBC 13.9 (*)    RBC 5.28 (*)    MCV 74.2 (*)    MCH 25.8 (*)    All other components within normal limits  URINALYSIS, ROUTINE W REFLEX MICROSCOPIC - Abnormal; Notable for the following components:   Hgb urine dipstick SMALL (*)    Protein, ur 30 (*)    All other components within normal limits  DIFFERENTIAL    EKG None  Radiology No results found.  Procedures Procedures    Medications Ordered in ED Medications  iohexol (OMNIPAQUE) 300 MG/ML solution 100 mL (has no administration in time range)  ondansetron (ZOFRAN) injection 4 mg (4 mg Intravenous Given 03/14/23 0050)  morphine (PF) 4 MG/ML injection 4 mg (4 mg Intravenous Given 03/14/23 0051)  sodium chloride 0.9 % bolus 500 mL (500 mLs Intravenous New Bag/Given 03/14/23 0100)    ED Course/ Medical Decision Making/ A&P                                 Medical Decision Making Amount and/or Complexity of Data Reviewed Labs: ordered. Radiology: ordered.  Risk Prescription drug management.   Medical Decision Making / ED Course   This patient presents to the ED for concern of abdominal pain., this involves an extensive number of treatment options, and is a complaint that carries with it a high risk of complications and morbidity.  The differential diagnosis includes gastritis, pancreatitis, UTI, nephrolithiasis, cholecystitis  MDM: 52 year old female presents today for concern of abdominal pain.  Mostly localized to the upper abdomen.  No urinary symptoms.  Denies GYN complaints.  Hemodynamically stable.  No chest pain, shortness of breath or other anginal symptoms.  CBC does show mild leukocytosis but no significant left shift.  CMP with preserved renal function.  Glucose of 250.  Otherwise no acute concern.  Lipase elevated at 131.  Not quite 3 times normal limit which would raise suspicion for pancreatitis.  No prior history  of pancreatitis.  No  significant alcohol use.  UA without evidence of UTI.  Does show small amount of hemoglobin.  CT obtained.  Shows no acute concern but does show ovarian cyst.  Ultrasound obtained.  Shows 5.2 cm complex cyst.  Recommend 6-week follow-up.  Patient states she has GYN established and will follow-up with them.  Given upper abdominal pain question if this is gastritis.  Will prescribe Protonix.  States she currently does not take anything for acid reflux.  I do see Protonix listed in med list but she denies this.  Discharged in stable condition.  Return precaution discussed.  Patient voices understanding and is in agreement with plan.    Lab Tests: -I ordered, reviewed, and interpreted labs.   The pertinent results include:   Labs Reviewed  LIPASE, BLOOD - Abnormal; Notable for the following components:      Result Value   Lipase 131 (*)    All other components within normal limits  COMPREHENSIVE METABOLIC PANEL - Abnormal; Notable for the following components:   Glucose, Bld 250 (*)    Alkaline Phosphatase 153 (*)    All other components within normal limits  CBC - Abnormal; Notable for the following components:   WBC 13.9 (*)    RBC 5.28 (*)    MCV 74.2 (*)    MCH 25.8 (*)    All other components within normal limits  URINALYSIS, ROUTINE W REFLEX MICROSCOPIC - Abnormal; Notable for the following components:   Hgb urine dipstick SMALL (*)    Protein, ur 30 (*)    All other components within normal limits  DIFFERENTIAL - Abnormal; Notable for the following components:   Neutro Abs 9.4 (*)    Lymphs Abs 4.5 (*)    All other components within normal limits      EKG  EKG Interpretation Date/Time:    Ventricular Rate:    PR Interval:    QRS Duration:    QT Interval:    QTC Calculation:   R Axis:      Text Interpretation:           Imaging Studies ordered: I ordered imaging studies including CT abdomen pelvis, pelvic ultrasound I independently visualized and interpreted  imaging. I agree with the radiologist interpretation   Medicines ordered and prescription drug management: Meds ordered this encounter  Medications   ondansetron (ZOFRAN) injection 4 mg   morphine (PF) 4 MG/ML injection 4 mg   sodium chloride 0.9 % bolus 500 mL   iohexol (OMNIPAQUE) 300 MG/ML solution 100 mL   dicyclomine (BENTYL) 20 MG tablet    Sig: Take 1 tablet (20 mg total) by mouth 2 (two) times daily.    Dispense:  20 tablet    Refill:  0    Order Specific Question:   Supervising Provider    Answer:   MILLER, BRIAN [[id number]]   ondansetron (ZOFRAN-ODT) 4 MG disintegrating tablet    Sig: Take 1 tablet (4 mg total) by mouth every 8 (eight) hours as needed.    Dispense:  20 tablet    Refill:  0    Order Specific Question:   Supervising Provider    Answer:   MILLER, BRIAN [[id number]]   pantoprazole (PROTONIX) 40 MG tablet    Sig: Take 1 tablet (40 mg total) by mouth daily.    Dispense:  30 tablet    Refill:  0    Order Specific Question:   Supervising Provider    Answer:  MILLER, BRIAN [[id number]]   morphine (PF) 2 MG/ML injection 2 mg    -I have reviewed the patients home medicines and have made adjustments as needed   Reevaluation: After the interventions noted above, I reevaluated the patient and found that they have :improved  Co morbidities that complicate the patient evaluation  Past Medical History:  Diagnosis Date   Anxiety    Common migraine with intractable migraine 10/02/2016   Depression    Diabetes mellitus    Hypertension       Dispostion: Discharged in stable condition.  Return precaution discussed.  Patient voices understanding and is in agreement with plan.   Final Clinical Impression(s) / ED Diagnoses Final diagnoses:  Abdominal pain, unspecified abdominal location  Gastritis, presence of bleeding unspecified, unspecified chronicity, unspecified gastritis type  Cyst of ovary, unspecified laterality    Rx / DC Orders ED Discharge Orders           Ordered    dicyclomine (BENTYL) 20 MG tablet  2 times daily        03/14/23 0400    ondansetron (ZOFRAN-ODT) 4 MG disintegrating tablet  Every 8 hours PRN        03/14/23 0400    pantoprazole (PROTONIX) 40 MG tablet  Daily        03/14/23 0400              Marita [place], PA-C 03/14/23 [id number]    Terald Sleeper, MD 03/14/23 [phone]

## 2023-03-30 ENCOUNTER — Telehealth (INDEPENDENT_AMBULATORY_CARE_PROVIDER_SITE_OTHER): Payer: Medicaid Other | Admitting: Psychiatry

## 2023-03-30 ENCOUNTER — Encounter (HOSPITAL_COMMUNITY): Payer: Self-pay | Admitting: Psychiatry

## 2023-03-30 DIAGNOSIS — F3341 Major depressive disorder, recurrent, in partial remission: Secondary | ICD-10-CM

## 2023-03-30 DIAGNOSIS — R41840 Attention and concentration deficit: Secondary | ICD-10-CM

## 2023-03-30 DIAGNOSIS — F4329 Adjustment disorder with other symptoms: Secondary | ICD-10-CM

## 2023-03-30 DIAGNOSIS — F411 Generalized anxiety disorder: Secondary | ICD-10-CM

## 2023-03-30 MED ORDER — HYDROXYZINE HCL 10 MG PO TABS: 10.0000 mg | ORAL_TABLET | Freq: Three times a day (TID) | ORAL | 3 refills | Status: DC | PRN

## 2023-03-30 MED ORDER — NICOTINE POLACRILEX 2 MG MT GUM: 2.0000 mg | CHEWING_GUM | OROMUCOSAL | 5 refills | Status: DC | PRN

## 2023-03-30 MED ORDER — ATOMOXETINE HCL 40 MG PO CAPS: 40.0000 mg | ORAL_CAPSULE | Freq: Every day | ORAL | 3 refills | Status: DC

## 2023-03-30 MED ORDER — DULOXETINE HCL 60 MG PO CPEP: ORAL_CAPSULE | ORAL | 3 refills | Status: DC

## 2023-03-30 MED ORDER — TRAZODONE HCL 100 MG PO TABS: 100.0000 mg | ORAL_TABLET | Freq: Every day | ORAL | 3 refills | Status: DC

## 2023-03-30 MED ORDER — NICOTINE 7 MG/24HR TD PT24: 7.0000 mg | MEDICATED_PATCH | Freq: Every day | TRANSDERMAL | 5 refills | Status: DC

## 2023-03-30 NOTE — Progress Notes (Signed)
BH MD/PA/NP OP Progress Note Virtual Visit via Video Note  I connected with [name] on 03/30/23 at 10:00 AM EST by a video enabled telemedicine application and verified that I am speaking with the correct person using two identifiers.  Location: Patient: Home Provider: Clinic   I discussed the limitations of evaluation and management by telemedicine and the availability of in person appointments. The patient expressed understanding and agreed to proceed.  I provided 30 minutes of non-face-to-face time during this encounter.    03/30/2023 10:17 AM [name]  MRN:  [id number]  Chief Complaint: "In the mornings I wake up with headaches"  HPI: 52 year old female seen today for follow-up psychiatric evaluation.   She has a psychiatric history of tobacco dependence (in remission) depression and prolonged grief.  Currently she is managed on Strattera 40 mg daily, Nicoderm gum, and Nicorette 7 mg patches,trazodone 100 mg nightly hydroxyzine 10 mg three times daily as needed, and Cymbalta 60 mg twice daily.  She notes her medications are effective in managing her psychiatric conditions.  Today patient was well-groomed, pleasant, cooperative, and engaged in conversation.  She informed writer that in the morning she wakes up with headaches.  Provider asked patient when the last time she was resized for her CPAP.  She notes that it has been a while.  She informed Clinical research associate that when she went to replace that she was told that it cannot be replaced.  Provider recommended that she following up with her PCP.  She endorsed understanding and agreed.    Since her last visit she informed writer that her anxiety and depression has been well-managed.  Today provider conducted a GAD-7 and patient scored a 2.  Provider also conducted PHQ-9 the patient scored a 10.  She endorses adequate sleep and appetite.  Today she denies SI/HI/AVH, mania, paranoia.    No medication changes made today.  Patient  agreeable to continue medication as prescribed.  No other concerns noted at this time.    Visit Diagnosis:    ICD-10-CM   1. Poor concentration  R41.840 atomoxetine (STRATTERA) 40 MG capsule    2. Generalized anxiety disorder  F41.1 hydrOXYzine (ATARAX) 10 MG tablet    DULoxetine (CYMBALTA) 60 MG capsule    3. MDD (major depressive disorder), recurrent, in partial remission (HCC)  F33.41 traZODone (DESYREL) 100 MG tablet    DULoxetine (CYMBALTA) 60 MG capsule    4. 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 DULoxetine (CYMBALTA) 60 MG capsule        Past Psychiatric History:  MDD, grief with prolonged bereavement reaction, tobacco dependence  Past Medical History:  Past Medical History:  Diagnosis Date   Anxiety    Common migraine with intractable migraine 10/02/2016   Depression    Diabetes mellitus    Hypertension     Past Surgical History:  Procedure Laterality Date   CHOLECYSTECTOMY     TUBAL LIGATION      Family Psychiatric History: Denied any family psychiatric history  Family History:  Family History  Problem Relation Age of Onset   Diabetes Mother    Hypertension Mother    Renal Disease Mother    Cancer Father    Diabetes Sister    Diabetes Brother    Renal Disease Brother    Diabetes Brother    Diabetes Sister    Diabetes Brother    Diabetes Brother    Diabetes Brother    Healthy Daughter    Healthy Daughter  Social History:  Social History   Socioeconomic History   Marital status: Single    Spouse name: Not on file   Number of children: Not on file   Years of education: Not on file   Highest education level: Not on file  Occupational History   Not on file  Tobacco Use   Smoking status: Every Day    Current packs/day: 0.25    Types: Cigarettes   Smokeless tobacco: Never  Vaping Use   Vaping status: Former  Substance and Sexual Activity   Alcohol use: No   Drug use: No   Sexual activity: Not on file  Other Topics Concern    Not on file  Social History Narrative   Right handed    Lives with daughter   Caffeine use: Drinks coffee/tea/soda sometimes   Social Drivers of Corporate investment banker Strain: Low Risk  (06/30/2022)   Overall Financial Resource Strain (CARDIA)    Difficulty of Paying Living Expenses: Not hard at all  Food Insecurity: No Food Insecurity (06/30/2022)   Hunger Vital Sign    Worried About Running Out of Food in the Last Year: Never true    Ran Out of Food in the Last Year: Never true  Transportation Needs: No Transportation Needs (06/30/2022)   PRAPARE - Administrator, Civil Service (Medical): No    Lack of Transportation (Non-Medical): No  Physical Activity: Sufficiently Active (06/30/2022)   Exercise Vital Sign    Days of Exercise per Week: 5 days    Minutes of Exercise per Session: 30 min  Stress: No Stress Concern Present (06/30/2022)   [employer] of Occupational Health - Occupational Stress Questionnaire    Feeling of Stress : Not at all  Social Connections: Socially Integrated (06/30/2022)   Social Connection and Isolation Panel [NHANES]    Frequency of Communication with Friends and Family: More than three times a week    Frequency of Social Gatherings with Friends and Family: More than three times a week    Attends Religious Services: 1 to 4 times per year    Active Member of [employer] or Organizations: Yes    Attends Banker Meetings: 1 to 4 times per year    Marital Status: Living with partner    Allergies:  Allergies  Allergen Reactions   Lisinopril     angioedema   Trulicity [Dulaglutide] Other (See Comments)    Gas, abdominal bloating    Metabolic Disorder Labs: Lab Results  Component Value Date   HGBA1C 7.1 (A) 09/28/2022   No results found for: "PROLACTIN" Lab Results  Component Value Date   CHOL 195 05/29/2022   TRIG 103 05/29/2022   HDL 45 05/29/2022   CHOLHDL 4.3 05/29/2022   LDLCALC 131 (H) 05/29/2022   LDLCALC 158  (H) 01/07/2021   Lab Results  Component Value Date   TSH 2.870 10/02/2018   TSH 4.840 (H) 01/09/2018    Therapeutic Level Labs: No results found for: "LITHIUM" No results found for: "VALPROATE" No results found for: "CBMZ"  Current Medications: Current Outpatient Medications  Medication Sig Dispense Refill   Accu-Chek Softclix Lancets lancets Use to check blood sugar three times daily. 100 each 6   albuterol (VENTOLIN HFA) 108 (90 Base) MCG/ACT inhaler Inhale 1-2 puffs into the lungs every 6 (six) hours as needed for wheezing or shortness of breath. 8 each 0   albuterol (VENTOLIN HFA) 108 (90 Base) MCG/ACT inhaler Inhale 1-2 puffs into the  lungs every 6 (six) hours as needed for up to 14 days for wheezing or shortness of breath. 1 each 0   amLODipine (NORVASC) 10 MG tablet TAKE 1 TABLET (10 MG TOTAL) BY MOUTH DAILY. (AM) 90 tablet 0   aspirin EC 81 MG tablet Take 81 mg by mouth every 6 (six) hours as needed for mild pain.      atomoxetine (STRATTERA) 40 MG capsule Take 1 capsule (40 mg total) by mouth daily. 30 capsule 3   blood glucose meter kit and supplies Dispense based on patient and insurance preference. Use up to four times daily as directed. (FOR ICD-10 E10.9, E11.9). 1 each 0   Blood Glucose Monitoring Suppl (ACCU-CHEK GUIDE) w/Device KIT Use to check blood sugar three times daily. 1 kit 0   celecoxib (CELEBREX) 200 MG capsule TAKE ONE CAPSULE BY MOUTH TWICE DAILY (AM+BEDTIME) 180 capsule 1   Continuous Blood Gluc Receiver (FREESTYLE LIBRE 2 READER) DEVI Use to check blood sugar continuously throughout the day. Change sensors once every 14 days. E11.9 1 each 0   Continuous Blood Gluc Sensor (FREESTYLE LIBRE 2 SENSOR) MISC Use to check blood sugar continuously throughout the day. Change sensors once every 14 days. E11.9 2 each 3   dicyclomine (BENTYL) 20 MG tablet Take 1 tablet (20 mg total) by mouth 2 (two) times daily. 20 tablet 0   DULoxetine (CYMBALTA) 60 MG capsule TAKE 1  CAPSULE (60 MG TOTAL) BY MOUTH 2 (TWO) TIMES DAILY (AM+BEDTIME) 90 capsule 3   EASY COMFORT PEN NEEDLES 31G X 8 MM MISC USE TO INJECT LEVEMIR TWICE A DAY 100 each 2   fluconazole (DIFLUCAN) 150 MG tablet Take 1 tablet (150 mg total) by mouth every 3 (three) days. (Patient not taking: Reported on 05/12/2022) 2 tablet 0   fluticasone (FLONASE) 50 MCG/ACT nasal spray PLACE 2 SPRAYS INTO BOTH NOSTRILS DAILY. 16 g 2   fluticasone (FLOVENT HFA) 44 MCG/ACT inhaler Inhale 2 puffs into the lungs 2 (two) times daily. 1 Inhaler 12   gabapentin (NEURONTIN) 300 MG capsule TAKE ONE CAPSULE BY MOUTH THREE TIMES DAILY ( AM+NOON+BEDTIME) 90 capsule 0   glucose blood (ACCU-CHEK GUIDE) test strip Use to check blood sugar three times daily. 100 each 6   HYDROcodone-acetaminophen (NORCO) 5-325 MG tablet Take 1 tablet by mouth every 4 (four) hours as needed for moderate pain. (Patient not taking: Reported on 05/12/2022) 10 tablet 0   hydrOXYzine (ATARAX) 10 MG tablet Take 1 tablet (10 mg total) by mouth 3 (three) times daily as needed. 90 tablet 3   insulin glargine (LANTUS SOLOSTAR) 100 UNIT/ML Solostar Pen Inject 56 Units into the skin daily. 45 mL 1   lidocaine (XYLOCAINE) 2 % jelly Place 1 Application into the urethra as needed. 30 mL 0   losartan (COZAAR) 50 MG tablet TAKE 1 TABLET (50 MG TOTAL) BY MOUTH DAILY (AM) 90 tablet 1   metFORMIN (GLUCOPHAGE) 1000 MG tablet TAKE ONE TABLET BY MOUTH TWICE DAILY (AM+BEDTIME) 60 tablet 0   nicotine (NICODERM CQ - DOSED IN MG/24 HR) 7 mg/24hr patch Place 1 patch (7 mg total) onto the skin daily. Remove before bedtime. 28 patch 5   nicotine polacrilex (NICORETTE) 2 MG gum Take 1 each (2 mg total) by mouth as needed for smoking cessation. Use every 1-2 hours as needed. 100 tablet 5   ondansetron (ZOFRAN) 4 MG tablet Take 1 tablet (4 mg total) by mouth every 8 (eight) hours as needed for nausea or vomiting.  20 tablet 0   ondansetron (ZOFRAN-ODT) 4 MG disintegrating tablet Take 1 tablet  (4 mg total) by mouth every 8 (eight) hours as needed. 20 tablet 0   pantoprazole (PROTONIX) 40 MG tablet TAKE 1 TABLET (40 MG TOTAL) BY MOUTH DAILY (AM) 30 tablet 3   pantoprazole (PROTONIX) 40 MG tablet Take 1 tablet (40 mg total) by mouth daily. 30 tablet 0   pravastatin (PRAVACHOL) 40 MG tablet TAKE 1 TABLET (40 MG TOTAL) BY MOUTH DAILY (BEDTIME) 90 tablet 1   Semaglutide, 2 MG/DOSE, 8 MG/3ML SOPN Inject 2 mg as directed once a week. 9 mL 2   traZODone (DESYREL) 100 MG tablet Take 1 tablet (100 mg total) by mouth at bedtime. 30 tablet 3   No current facility-administered medications for this visit.     Musculoskeletal: Strength & Muscle Tone: within normal limits and telehealth visit Gait & Station: normal, telehealth visit Patient leans: N/A  Psychiatric Specialty Exam: Review of Systems  There were no vitals taken for this visit.There is no height or weight on file to calculate BMI.  General Appearance: Well Groomed  Eye Contact:  Good  Speech:  Clear and Coherent and Normal Rate  Volume:  Normal  Mood:  Euthymic  Affect:  Appropriate and Congruent  Thought Process:  Coherent, Goal Directed, and Linear  Orientation:  Full (Time, Place, and Person)  Thought Content: WDL and Logical   Suicidal Thoughts:  No  Homicidal Thoughts:  No  Memory:  Immediate;   Good Recent;   Good Remote;   Good  Judgement:  Good  Insight:  Good  Psychomotor Activity:  Normal  Concentration:  Concentration: Good and Attention Span: Good  Recall:  Good  Fund of Knowledge: Good  Language: Good  Akathisia:  No  Handed:  Right  AIMS (if indicated): not done  Assets:  Communication Skills Desire for Improvement Financial Resources/Insurance Housing Leisure Time Physical Health Social Support  ADL's:  Intact  Cognition: WNL  Sleep:  Good   Screenings: GAD-7    Flowsheet Row Video Visit from [facility]30/2023 [facility] Video Visit from 10/13/2022 in [facility] Video Visit from 08/04/2022 in [facility] Video Visit from 05/12/2022 in [facility] Video Visit from 10/26/2021 in [facility]  Total GAD-7 Score 2 11 9 6 9       PHQ2-9    Flowsheet Row Video Visit from 03/30/2023 in [facility] Video Visit from 10/13/2022 in [facility] Video Visit from 08/04/2022 in [facility] Office Visit from 06/30/2022 in [facility] [place] Video Visit from 05/12/2022 in [facility]  PHQ-2 Total Score 3 2 0 3 3  PHQ-9 Total Score 10 11 3 10 10       Flowsheet Row ED from 02/26/2023 in [facility] Department at [facility] ED from 07/07/2022 in [facility] ED from 12/19/2021 in [facility] at [facility] RISK CATEGORY No Risk No Risk No Risk        Assessment and Plan: Patient reports that at times she wakes up with a headache.  Provider and patient discussed how this could be related to her CPAP machine.  Mentally she notes that she is doing well.  No medication changes made today.  Patient agreeable to taking medications as prescribed. 1. MDD (major depressive disorder), recurrent, in partial remission (HCC)  Continue- DULoxetine (CYMBALTA) 60 MG capsule; TAKE 1 CAPSULE (60 MG TOTAL) BY MOUTH 2 (TWO) TIMES DAILY (AM+BEDTIME)  Dispense: 90 capsule; Refill: 3 Continue- traZODone (DESYREL) 100 MG tablet; Take 1 tablet (100 mg total) by mouth at bedtime.  Dispense: 30 tablet; Refill: 3  2. Grief reaction with prolonged bereavement  Continue- DULoxetine (CYMBALTA) 60 MG capsule; TAKE 1 CAPSULE (60 MG TOTAL) BY MOUTH 2 (TWO) TIMES DAILY (AM+BEDTIME)  Dispense: 90 capsule; Refill: 3  3. Generalized anxiety disorder  Continue-  DULoxetine (CYMBALTA) 60 MG capsule; TAKE 1 CAPSULE (60 MG TOTAL) BY MOUTH 2 (TWO) TIMES DAILY (AM+BEDTIME)  Dispense: 90 capsule; Refill: 3 Continue- hydrOXYzine (ATARAX) 10 MG tablet; Take 1 tablet (10 mg total) by mouth 3 (three) times daily as needed.  Dispense: 90 tablet; Refill: 3  4. Poor concentration  Continue- atomoxetine (STRATTERA) 40 MG capsule; Take 1 capsule (40 mg total) by mouth daily.  Dispense: 30 capsule; Refill: 3  Follow-up in 2 months Shanna Cisco, NP 03/30/2023, 10:17 AM

## 2023-03-31 IMAGING — CT CT ABD-PELV W/ CM
2 of 6 series · 17 of 46 positions shown, 19 images · IV contrast (APPLIED)
Comparison: None

CLINICAL DATA: Right lower quadrant abdominal pain.

EXAM:
CT ABDOMEN AND PELVIS WITH CONTRAST
TECHNIQUE: Multidetector CT imaging of the abdomen and pelvis was performed
using the standard protocol following bolus administration of
intravenous contrast.

[Series 3: thins · axial · 0.91mm/px · z∈[+824,+1273]mm · 14 of 748 slices shown, 16 images]
[im 30/748  soft-tissue]
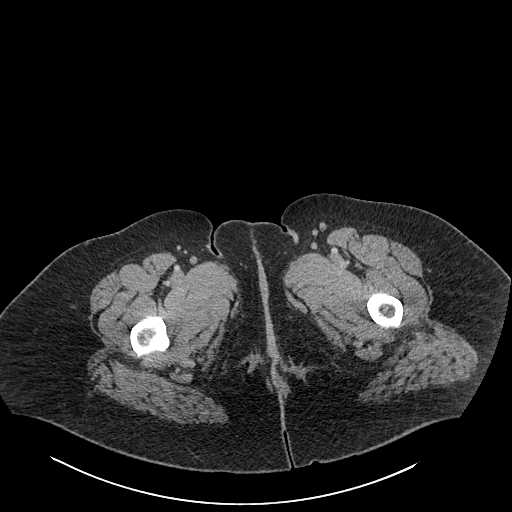
[im 30/748  bone]
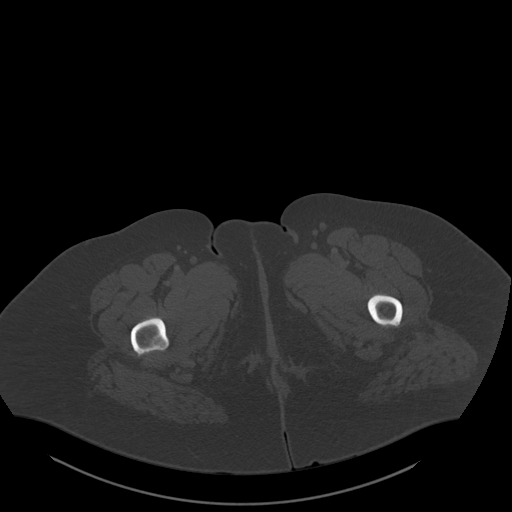
[im 90/748  soft-tissue]
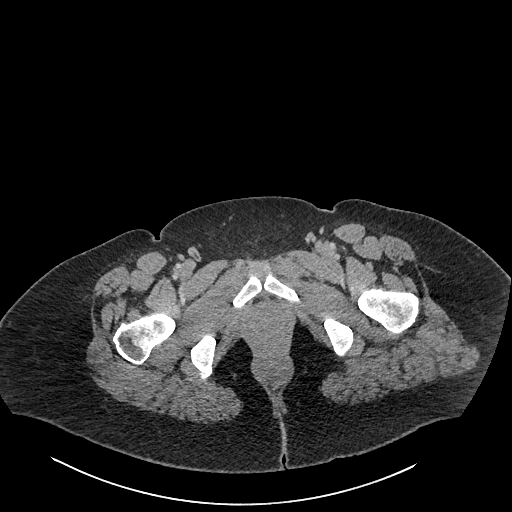
[im 150/748  soft-tissue]
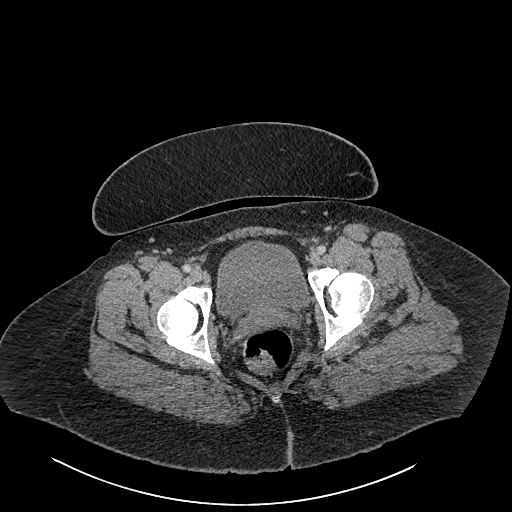
[im 210/748  soft-tissue]
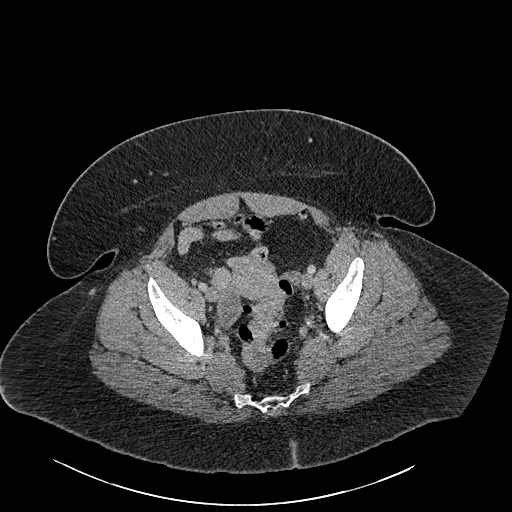
[im 240/748  soft-tissue]
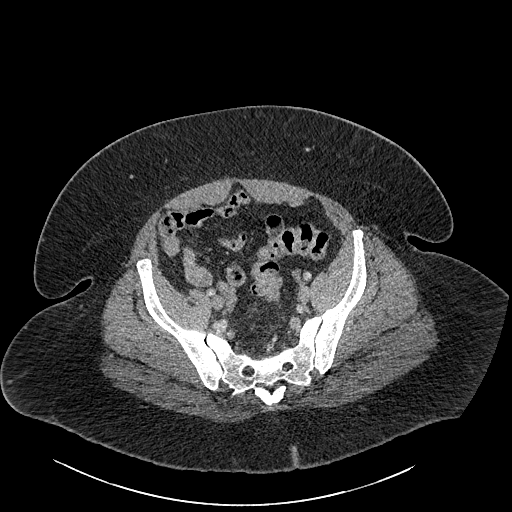
[im 299/748  soft-tissue]
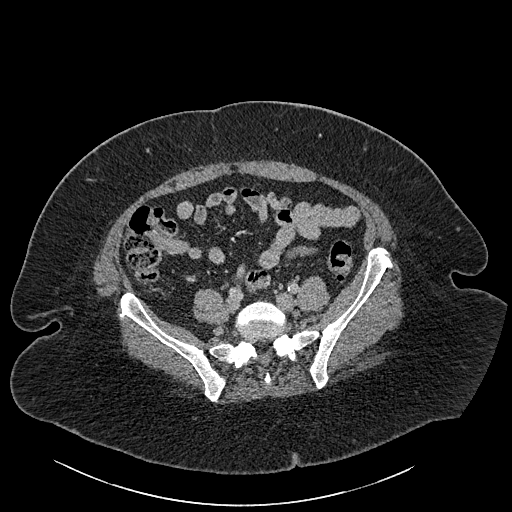
[im 359/748  soft-tissue]
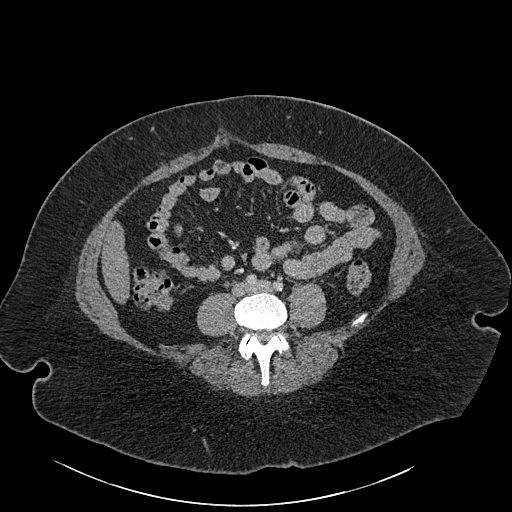
[im 389/748  soft-tissue]
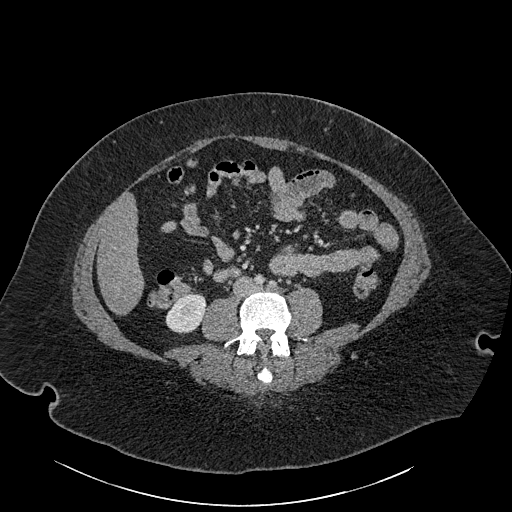
[im 449/748  soft-tissue]
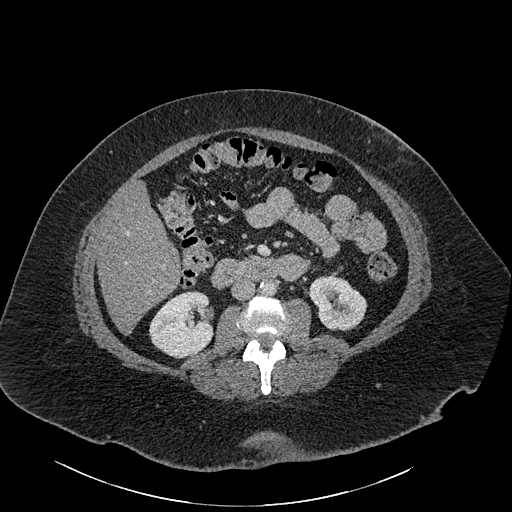
[im 449/748  bone]
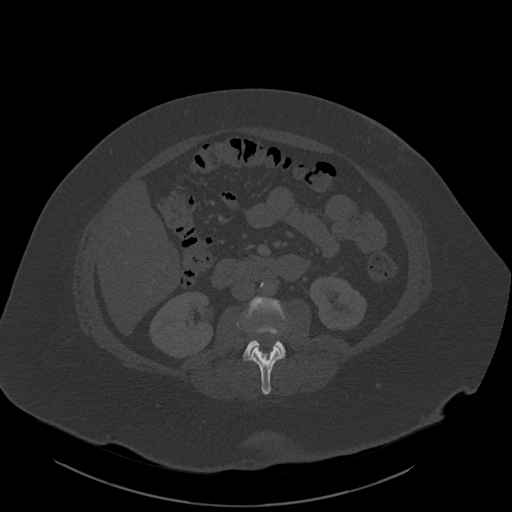
[im 508/748  soft-tissue]
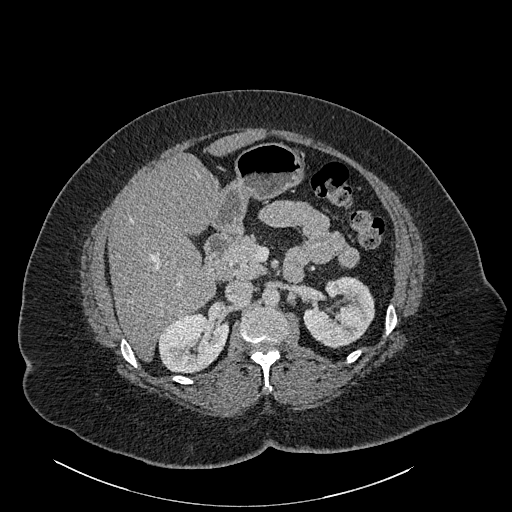
[im 568/748  soft-tissue]
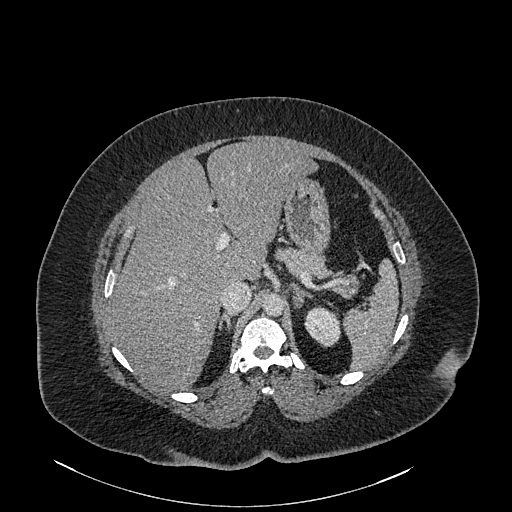
[im 598/748  soft-tissue]
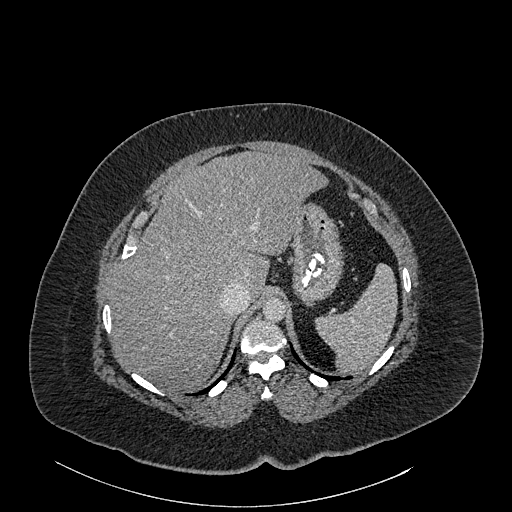
[im 658/748  soft-tissue]
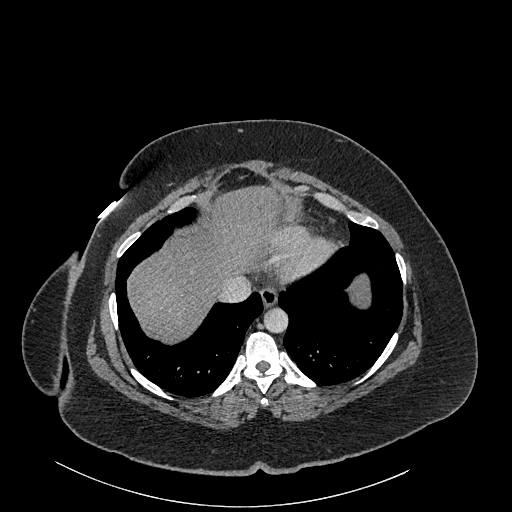
[im 718/748  soft-tissue]
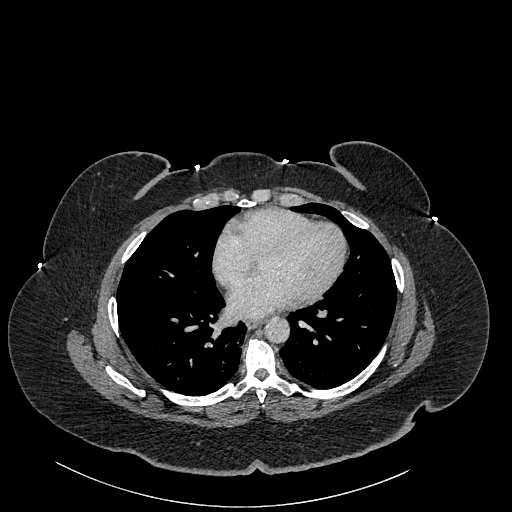

[Series 5: coronal · coronal · 0.95mm/px · 3 of 119 slices shown]
[im 40/119  soft-tissue]
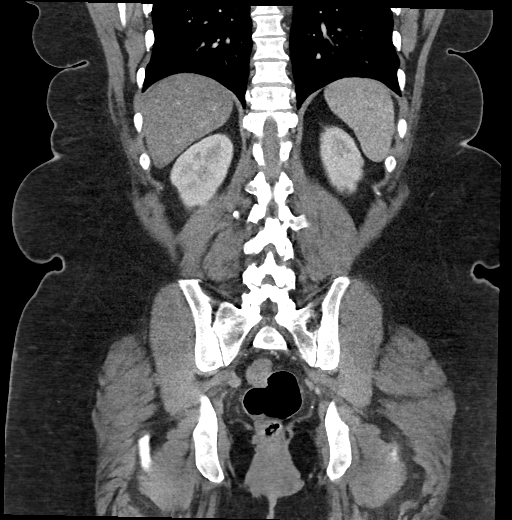
[im 53/119  soft-tissue]
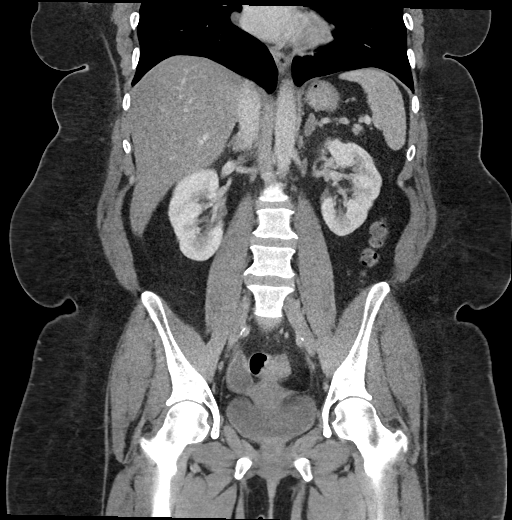
[im 66/119  soft-tissue]
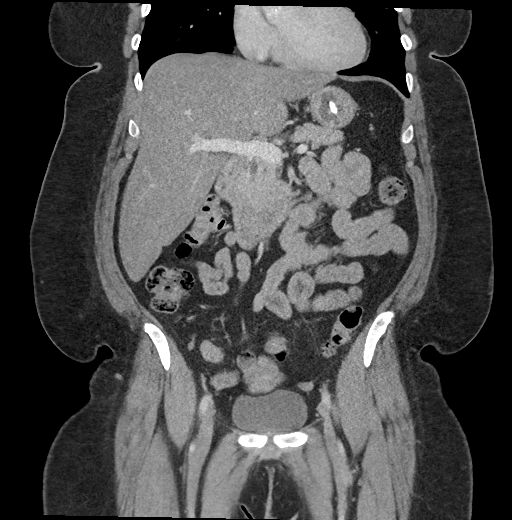

[17 of 46 positions shown; findings below may reference images not displayed]

RADIATION DOSE REDUCTION: This exam was performed according to the
departmental dose-optimization program which includes automated
exposure control, adjustment of the mA and/or kV according to
patient size and/or use of iterative reconstruction technique.

CONTRAST:  75mL OMNIPAQUE IOHEXOL 350 MG/ML SOLN
FINDINGS: Lower chest: The visualized lung bases are clear.

No intra-abdominal free air or free fluid.

Hepatobiliary: Fatty liver. No intrahepatic biliary dilatation.
Cholecystectomy.

Pancreas: Unremarkable. No pancreatic ductal dilatation or
surrounding inflammatory changes.

Spleen: Normal in size without focal abnormality.

Adrenals/Urinary Tract: The adrenal glands unremarkable. The
kidneys, visualized ureters, and urinary bladder appear
unremarkable.

Stomach/Bowel: There is no bowel obstruction or active inflammation.
The appendix is normal.

Vascular/Lymphatic: Mild aortoiliac atherosclerotic disease. The IVC
is unremarkable. No portal venous gas. There is no adenopathy.

Reproductive: The uterus is anteverted and grossly unremarkable. A
2.3 cm right ovarian complex appearing cyst. This can be better
evaluated with ultrasound if clinically indicated. The left ovary is
unremarkable.

Other: None

Musculoskeletal: No acute or significant osseous findings.
IMPRESSION: 1. No bowel obstruction. Normal appendix.
2. Fatty liver.
3. A 2.3 cm right ovarian complex appearing cyst. This can be better
evaluated with ultrasound if clinically indicated.
4. Aortic Atherosclerosis ([id number]).

## 2023-04-12 ENCOUNTER — Other Ambulatory Visit (INDEPENDENT_AMBULATORY_CARE_PROVIDER_SITE_OTHER): Payer: Self-pay | Admitting: Primary Care

## 2023-04-12 DIAGNOSIS — E119 Type 2 diabetes mellitus without complications: Secondary | ICD-10-CM

## 2023-04-12 NOTE — Telephone Encounter (Signed)
 Medication Refill -  Most Recent Primary Care Visit:  Provider: CELESTIA ROSALINE SQUIBB  Department: [facility]  Visit Type: TELEPHONE OFFICE VISIT  Date: 12/13/2022  Medication: metFORMIN  (GLUCOPHAGE ) 1000 MG tablet   Has the patient contacted their pharmacy? No  Is this the correct pharmacy for this prescription? Yes  This is the patient's preferred pharmacy: [facility] - [place], [place] - [address] [place] [address] Phone: [phone] Fax: [phone]  Has the prescription been filled recently? Yes  Is the patient out of the medication? Yes  Has the patient been seen for an appointment in the last year OR does the patient have an upcoming appointment? Yes  Can we respond through MyChart? No  Agent: Please be advised that Rx refills may take up to 3 business days. We ask that you follow-up with your pharmacy.  Patient has not had her meds in 5 days

## 2023-04-13 NOTE — Telephone Encounter (Signed)
 Pt requesting refill.   Last A1C-7.1 on 09/28/22 pt doesn't have a scheduled appt coming up   This is the last 2 recent dispense for medication   Dispensed Days Supply Quantity Provider Pharmacy  METFORMIN  1000 MG  TABLET 01/24/2023 30 60 each Celestia Rosaline SQUIBB, NP [facility]...  METFORMIN  HCL 1000 MG ORAL TABLET 12/15/2022 30 60 each Celestia Rosaline SQUIBB, NP   [facility].SABRASABRA

## 2023-04-16 NOTE — Telephone Encounter (Signed)
 Called pt and appt made for tomorrow. Sending to office. Pt with high rate of cancellation and no shows. Last reordered 01/23/23 #60 tablet or a 30 day courtesy refill.   Requested Prescriptions  Pending Prescriptions Disp Refills   metFORMIN  (GLUCOPHAGE ) 1000 MG tablet 60 tablet 0    Sig: TAKE ONE TABLET BY MOUTH TWICE DAILY (AM+BEDTIME)     Endocrinology:  Diabetes - Biguanides Failed - 04/16/2023  2:02 PM      Failed - HBA1C is between 0 and 7.9 and within 180 days    HbA1c, POC (controlled diabetic range)  Date Value Ref Range Status  09/28/2022 7.1 (A) 0.0 - 7.0 % Final         Failed - B12 Level in normal range and within 720 days    Vitamin B-12  Date Value Ref Range Status  08/01/2018 301 232 - 1,245 pg/mL Final         Failed - Valid encounter within last 6 months    Recent Outpatient Visits           4 months ago Primary osteoarthritis of both knees   [facility] Celestia Rosaline SQUIBB, NP   6 months ago Type 2 diabetes mellitus without complication, without long-term current use of insulin  (HCC)   [place] [facility] Celestia Rosaline SQUIBB, NP   8 months ago Type 2 diabetes mellitus without complication, without long-term current use of insulin  (HCC)   [facility] [place] Fleeta Morris, [place] L, RPH-CPP   9 months ago Type 2 diabetes mellitus without complication, with long-term current use of insulin  [facility])   [facility] [place] Fleeta Morris, [place] L, RPH-CPP   10 months ago Type 2 diabetes mellitus without complication, with long-term current use of insulin  [facility])   [facility] [place] Fleeta Morris Garnette LITTIE, RPH-CPP       Future Appointments             Tomorrow Celestia Rosaline SQUIBB, NP [place] [facility]            Failed - CBC  within normal limits and completed in the last 12 months    WBC  Date Value Ref Range Status  03/13/2023 13.9 (H) 4.0 - 10.5 K/uL Final   RBC  Date Value Ref Range Status  03/13/2023 5.28 (H) 3.87 - 5.11 MIL/uL Final   Hemoglobin  Date Value Ref Range Status  03/13/2023 13.6 12.0 - 15.0 g/dL Final  90/69/7977 84.7 11.1 - 15.9 g/dL Final   HCT  Date Value Ref Range Status  03/13/2023 39.2 36.0 - 46.0 % Final   Hematocrit  Date Value Ref Range Status  01/07/2021 46.1 34.0 - 46.6 % Final   MCHC  Date Value Ref Range Status  03/13/2023 34.7 30.0 - 36.0 g/dL Final   [facility]  Date Value Ref Range Status  03/13/2023 25.8 (L) 26.0 - 34.0 pg Final   MCV  Date Value Ref Range Status  03/13/2023 74.2 (L) 80.0 - 100.0 fL Final  01/07/2021 76 (L) 79 - 97 fL Final   No results found for: PLTCOUNTKUC, LABPLAT, POCPLA RDW  Date Value Ref Range Status  03/13/2023 14.1 11.5 - 15.5 % Final  01/07/2021 14.2 11.7 - 15.4 % Final  Passed - Cr in normal range and within 360 days    Creatinine, Ser  Date Value Ref Range Status  03/13/2023 0.85 0.44 - 1.00 mg/dL Final         Passed - eGFR in normal range and within 360 days    GFR calc Af Amer  Date Value Ref Range Status  12/07/2019 >60 >60 mL/min Final   GFR, Estimated  Date Value Ref Range Status  03/13/2023 >60 >60 mL/min Final    Comment:    (NOTE) Calculated using the CKD-EPI Creatinine Equation (2021)    eGFR  Date Value Ref Range Status  05/29/2022 101 >59 mL/min/1.73 Final

## 2023-04-17 ENCOUNTER — Ambulatory Visit (INDEPENDENT_AMBULATORY_CARE_PROVIDER_SITE_OTHER): Payer: Medicaid Other | Admitting: Primary Care

## 2023-04-17 DIAGNOSIS — Z1231 Encounter for screening mammogram for malignant neoplasm of breast: Secondary | ICD-10-CM

## 2023-04-17 DIAGNOSIS — E119 Type 2 diabetes mellitus without complications: Secondary | ICD-10-CM

## 2023-04-17 DIAGNOSIS — Z1211 Encounter for screening for malignant neoplasm of colon: Secondary | ICD-10-CM

## 2023-04-20 ENCOUNTER — Ambulatory Visit (INDEPENDENT_AMBULATORY_CARE_PROVIDER_SITE_OTHER): Payer: Medicaid Other | Admitting: Primary Care

## 2023-04-20 ENCOUNTER — Encounter (INDEPENDENT_AMBULATORY_CARE_PROVIDER_SITE_OTHER): Payer: Self-pay | Admitting: Primary Care

## 2023-04-20 VITALS — BP 153/89 | HR 70 | Resp 16 | Ht 65.5 in | Wt 236.4 lb

## 2023-04-20 DIAGNOSIS — Z6838 Body mass index (BMI) 38.0-38.9, adult: Secondary | ICD-10-CM | POA: Diagnosis not present

## 2023-04-20 DIAGNOSIS — Z5941 Food insecurity: Secondary | ICD-10-CM

## 2023-04-20 DIAGNOSIS — E119 Type 2 diabetes mellitus without complications: Secondary | ICD-10-CM | POA: Diagnosis not present

## 2023-04-20 DIAGNOSIS — R300 Dysuria: Secondary | ICD-10-CM

## 2023-04-20 DIAGNOSIS — G4733 Obstructive sleep apnea (adult) (pediatric): Secondary | ICD-10-CM | POA: Diagnosis not present

## 2023-04-20 DIAGNOSIS — Z2821 Immunization not carried out because of patient refusal: Secondary | ICD-10-CM | POA: Diagnosis not present

## 2023-04-20 DIAGNOSIS — Z7984 Long term (current) use of oral hypoglycemic drugs: Secondary | ICD-10-CM

## 2023-04-20 DIAGNOSIS — E669 Obesity, unspecified: Secondary | ICD-10-CM | POA: Diagnosis not present

## 2023-04-20 DIAGNOSIS — E66812 Obesity, class 2: Secondary | ICD-10-CM | POA: Diagnosis not present

## 2023-04-20 DIAGNOSIS — Z1211 Encounter for screening for malignant neoplasm of colon: Secondary | ICD-10-CM

## 2023-04-20 DIAGNOSIS — Z1231 Encounter for screening mammogram for malignant neoplasm of breast: Secondary | ICD-10-CM

## 2023-04-20 LAB — POCT URINALYSIS DIP (CLINITEK)
Bilirubin, UA: NEGATIVE
Glucose, UA: NEGATIVE mg/dL
Ketones, POC UA: NEGATIVE mg/dL
Leukocytes, UA: NEGATIVE
Nitrite, UA: NEGATIVE
POC PROTEIN,UA: 30 — AB
Spec Grav, UA: 1.025 (ref 1.010–1.025)
Urobilinogen, UA: 0.2 U/dL
pH, UA: 5.5 (ref 5.0–8.0)

## 2023-04-20 LAB — POCT GLYCOSYLATED HEMOGLOBIN (HGB A1C): HbA1c, POC (controlled diabetic range): 10.1 % — AB (ref 0.0–7.0)

## 2023-04-20 NOTE — Progress Notes (Signed)
 Subjective:  Patient ID: [name], female    DOB: 1970/09/01  Age: 53 y.o. MRN: [id number]  CC: Hospitalization Follow-up (ED 03/13/23), Diabetes, Generalized Body Aches (/), and Dysuria   [name] presents forFollow-up of diabetes. Patient does not check blood sugar at home Diabetes  Dysuria    ED 03/13/23- d/c dx-Abdominal pain, unspecified abdominal location, Gastritis, presence of bleeding unspecified, unspecified chronicity, unspecified gastritis type Cyst of ovary, unspecified laterality Compliant with meds - Yes Checking CBGs? No  Fasting avg -   Postprandial average -  Exercising regularly? - No Watching carbohydrate intake? - No Neuropathy ? - Yes Hypoglycemic events - No  - Recovers with :   Pertinent ROS:  Polyuria - Yes Polydipsia - Yes Vision problems - No Elevated HTN- Bp eats breads, sodas not exercising and sausages several times a da week. Medications as noted below. Taking them regularly without complication/adverse reaction being reported today.  Also, OSA feels CPAP is not properly working History [name] has a past medical history of Anxiety, Common migraine with intractable migraine (10/02/2016), Depression, Diabetes mellitus, and Hypertension.   She has a past surgical history that includes Cholecystectomy and Tubal ligation.   Her family history includes Cancer in her father; Diabetes in her brother, brother, brother, brother, brother, mother, sister, and sister; Healthy in her daughter and daughter; Hypertension in her mother; Renal Disease in her brother and mother.She reports that she has been smoking cigarettes. She has never used smokeless tobacco. She reports that she does not drink alcohol and does not use drugs.  Current Outpatient Medications on File Prior to Visit  Medication Sig Dispense Refill   Accu-Chek Softclix Lancets lancets Use to check blood sugar three times daily. 100 each 6   albuterol  (VENTOLIN  HFA) 108 (90 Base) MCG/ACT  inhaler Inhale 1-2 puffs into the lungs every 6 (six) hours as needed for wheezing or shortness of breath. 8 each 0   albuterol  (VENTOLIN  HFA) 108 (90 Base) MCG/ACT inhaler Inhale 1-2 puffs into the lungs every 6 (six) hours as needed for up to 14 days for wheezing or shortness of breath. 1 each 0   amLODipine  (NORVASC ) 10 MG tablet TAKE 1 TABLET (10 MG TOTAL) BY MOUTH DAILY. (AM) 90 tablet 0   aspirin  EC 81 MG tablet Take 81 mg by mouth every 6 (six) hours as needed for mild pain.      atomoxetine  (STRATTERA ) 40 MG capsule Take 1 capsule (40 mg total) by mouth daily. 30 capsule 3   blood glucose meter kit and supplies Dispense based on patient and insurance preference. Use up to four times daily as directed. (FOR ICD-10 E10.9, E11.9). 1 each 0   Blood Glucose Monitoring Suppl (ACCU-CHEK GUIDE) w/Device KIT Use to check blood sugar three times daily. 1 kit 0   celecoxib  (CELEBREX ) 200 MG capsule TAKE ONE CAPSULE BY MOUTH TWICE DAILY (AM+BEDTIME) 180 capsule 1   Continuous Blood Gluc Receiver (FREESTYLE LIBRE 2 READER) DEVI Use to check blood sugar continuously throughout the day. Change sensors once every 14 days. E11.9 1 each 0   Continuous Blood Gluc Sensor (FREESTYLE LIBRE 2 SENSOR) MISC Use to check blood sugar continuously throughout the day. Change sensors once every 14 days. E11.9 2 each 3   dicyclomine  (BENTYL ) 20 MG tablet Take 1 tablet (20 mg total) by mouth 2 (two) times daily. 20 tablet 0   DULoxetine  (CYMBALTA ) 60 MG capsule TAKE 1 CAPSULE (60 MG TOTAL) BY MOUTH 2 (TWO) TIMES DAILY (  AM+BEDTIME) 90 capsule 3   EASY COMFORT PEN NEEDLES 31G X 8 MM MISC USE TO INJECT LEVEMIR  TWICE A DAY 100 each 2   fluconazole  (DIFLUCAN ) 150 MG tablet Take 1 tablet (150 mg total) by mouth every 3 (three) days. (Patient not taking: Reported on 05/12/2022) 2 tablet 0   fluticasone  (FLONASE ) 50 MCG/ACT nasal spray PLACE 2 SPRAYS INTO BOTH NOSTRILS DAILY. 16 g 2   fluticasone  (FLOVENT  HFA) 44 MCG/ACT inhaler  Inhale 2 puffs into the lungs 2 (two) times daily. 1 Inhaler 12   gabapentin  (NEURONTIN ) 300 MG capsule TAKE ONE CAPSULE BY MOUTH THREE TIMES DAILY ( AM+NOON+BEDTIME) 90 capsule 0   glucose blood (ACCU-CHEK GUIDE) test strip Use to check blood sugar three times daily. 100 each 6   HYDROcodone -acetaminophen  (NORCO) 5-325 MG tablet Take 1 tablet by mouth every 4 (four) hours as needed for moderate pain. (Patient not taking: Reported on 05/12/2022) 10 tablet 0   hydrOXYzine  (ATARAX ) 10 MG tablet Take 1 tablet (10 mg total) by mouth 3 (three) times daily as needed. 90 tablet 3   insulin  glargine (LANTUS  SOLOSTAR) 100 UNIT/ML Solostar Pen Inject 56 Units into the skin daily. 45 mL 1   lidocaine  (XYLOCAINE ) 2 % jelly Place 1 Application into the urethra as needed. 30 mL 0   losartan  (COZAAR ) 50 MG tablet TAKE 1 TABLET (50 MG TOTAL) BY MOUTH DAILY (AM) 90 tablet 1   metFORMIN  (GLUCOPHAGE ) 1000 MG tablet TAKE ONE TABLET BY MOUTH TWICE DAILY (AM+BEDTIME) 60 tablet 0   nicotine  (NICODERM CQ  - DOSED IN MG/24 HR) 7 mg/24hr patch Place 1 patch (7 mg total) onto the skin daily. Remove before bedtime. 28 patch 5   nicotine  polacrilex (NICORETTE ) 2 MG gum Take 1 each (2 mg total) by mouth as needed for smoking cessation. Use every 1-2 hours as needed. 100 tablet 5   ondansetron  (ZOFRAN ) 4 MG tablet Take 1 tablet (4 mg total) by mouth every 8 (eight) hours as needed for nausea or vomiting. 20 tablet 0   ondansetron  (ZOFRAN -ODT) 4 MG disintegrating tablet Take 1 tablet (4 mg total) by mouth every 8 (eight) hours as needed. 20 tablet 0   pantoprazole  (PROTONIX ) 40 MG tablet TAKE 1 TABLET (40 MG TOTAL) BY MOUTH DAILY (AM) 30 tablet 3   pantoprazole  (PROTONIX ) 40 MG tablet Take 1 tablet (40 mg total) by mouth daily. 30 tablet 0   pravastatin  (PRAVACHOL ) 40 MG tablet TAKE 1 TABLET (40 MG TOTAL) BY MOUTH DAILY (BEDTIME) 90 tablet 1   Semaglutide , 2 MG/DOSE, 8 MG/3ML SOPN Inject 2 mg as directed once a week. 9 mL 2    traZODone  (DESYREL ) 100 MG tablet Take 1 tablet (100 mg total) by mouth at bedtime. 30 tablet 3   No current facility-administered medications on file prior to visit.    Review of Systems  Genitourinary:  Positive for dysuria.   Comprehensive ROS Pertinent positive and negative noted in HPI   Objective:  BP (!) 153/89 (BP Location: Left Arm, Patient Position: Sitting, Cuff Size: Large)   Pulse 70   Resp 16   Ht 5' 5.5 (1.664 m)   Wt 236 lb 6.4 oz (107.2 kg)   SpO2 95%   BMI 38.74 kg/m   BP Readings from Last 3 Encounters:  04/20/23 (!) 153/89  03/14/23 139/82  02/26/23 (!) 142/94    Wt Readings from Last 3 Encounters:  04/20/23 236 lb 6.4 oz (107.2 kg)  03/13/23 230 lb (104.3 kg)  09/28/22 230 lb 12.8 oz (104.7 kg)    Physical Exam Vitals reviewed.  Constitutional:      Appearance: She is obese.  HENT:     Head: Normocephalic.     Right Ear: Tympanic membrane, ear canal and external ear normal.     Left Ear: Tympanic membrane, ear canal and external ear normal.     Nose: Nose normal.     Mouth/Throat:     Mouth: Mucous membranes are moist.  Eyes:     Extraocular Movements: Extraocular movements intact.     Pupils: Pupils are equal, round, and reactive to light.  Cardiovascular:     Rate and Rhythm: Normal rate.  Pulmonary:     Effort: Pulmonary effort is normal.     Breath sounds: Normal breath sounds.  Abdominal:     General: Bowel sounds are normal.     Palpations: Abdomen is soft.  Musculoskeletal:        General: Normal range of motion.     Cervical back: Normal range of motion and neck supple.  Skin:    General: Skin is warm and dry.  Neurological:     Mental Status: She is alert and oriented to person, place, and time.  Psychiatric:        Mood and Affect: Mood normal.        Behavior: Behavior normal.        Thought Content: Thought content normal.     Lab Results  Component Value Date   HGBA1C 10.1 (A) 04/20/2023   HGBA1C 7.1 (A)  09/28/2022   HGBA1C 8.9 (A) 05/12/2022    Lab Results  Component Value Date   WBC 13.9 (H) 03/13/2023   HGB 13.6 03/13/2023   HCT 39.2 03/13/2023   PLT 273 03/13/2023   GLUCOSE 250 (H) 03/13/2023   CHOL 195 05/29/2022   TRIG 103 05/29/2022   HDL 45 05/29/2022   LDLCALC 131 (H) 05/29/2022   ALT 37 03/13/2023   AST 28 03/13/2023   NA 137 03/13/2023   K 3.6 03/13/2023   CL 106 03/13/2023   CREATININE 0.85 03/13/2023   BUN 11 03/13/2023   CO2 22 03/13/2023   TSH 2.870 10/02/2018   HGBA1C 10.1 (A) 04/20/2023     Assessment & Plan:   Khloey was seen today for hospitalization follow-up, diabetes, generalized body aches and dysuria.  Diagnoses and all orders for this visit:  Type 2 diabetes mellitus without complication, without long-term current use of insulin  (HCC) -     POCT glycosylated hemoglobin (Hb A1C)  Complications from uncontrolled diabetes -diabetic retinopathy leading to blindness, diabetic nephropathy leading to dialysis, decrease in circulation decrease in sores or wound healing which may lead to amputations and increase of heart attack and stroke   Dysuria -     POCT URINALYSIS DIP (CLINITEK)  Screening for colon cancer -     Cologuard  Encounter for screening mammogram for malignant neoplasm of breast -     MM 3D SCREENING MAMMOGRAM BILATERAL BREAST; Future  Herpes zoster vaccination declined  Influenza vaccination declined  Pneumococcal vaccination declined  OSA on CPAP -     Ambulatory referral to Pulmonology    Follow-up:  No follow-ups on file.  The above assessment and management plan was discussed with the patient. The patient verbalized understanding of and has agreed to the management plan. Patient is aware to call the clinic if symptoms fail to improve or worsen. Patient is aware when to return to  the clinic for a follow-up visit. Patient educated on when it is appropriate to go to the emergency department.   Rosaline Bohr, NP-C

## 2023-04-22 ENCOUNTER — Encounter (HOSPITAL_COMMUNITY): Payer: Self-pay | Admitting: Emergency Medicine

## 2023-04-22 ENCOUNTER — Emergency Department (HOSPITAL_COMMUNITY): Payer: Medicaid Other

## 2023-04-22 ENCOUNTER — Emergency Department (HOSPITAL_COMMUNITY)
Admission: EM | Admit: 2023-04-22 | Discharge: 2023-04-23 | Discharge status: Against Medical Advice | Payer: Medicaid Other | Attending: Emergency Medicine | Admitting: Emergency Medicine

## 2023-04-22 DIAGNOSIS — R079 Chest pain, unspecified: Secondary | ICD-10-CM | POA: Insufficient documentation

## 2023-04-22 DIAGNOSIS — R0602 Shortness of breath: Secondary | ICD-10-CM | POA: Insufficient documentation

## 2023-04-22 DIAGNOSIS — Z5321 Procedure and treatment not carried out due to patient leaving prior to being seen by health care provider: Secondary | ICD-10-CM | POA: Diagnosis not present

## 2023-04-22 LAB — BASIC METABOLIC PANEL
Anion gap: 8 (ref 5–15)
BUN: 11 mg/dL (ref 6–20)
CO2: 24 mmol/L (ref 22–32)
Calcium: 9.2 mg/dL (ref 8.9–10.3)
Chloride: 108 mmol/L (ref 98–111)
Creatinine, Ser: 0.71 mg/dL (ref 0.44–1.00)
GFR, Estimated: >60 mL/min (ref >60)
Glucose, Bld: 219 mg/dL — ABNORMAL HIGH (ref 70–99)
Potassium: 3.7 mmol/L (ref 3.5–5.1)
Sodium: 140 mmol/L (ref 135–145)

## 2023-04-22 LAB — CBC
HCT: 42.4 % (ref 36.0–46.0)
Hemoglobin: 14.8 g/dL (ref 12.0–15.0)
MCH: 25.9 pg — ABNORMAL LOW (ref 26.0–34.0)
MCHC: 34.9 g/dL (ref 30.0–36.0)
MCV: 74.1 fL — ABNORMAL LOW (ref 80.0–100.0)
Platelets: 303 10*3/uL (ref 150–400)
RBC: 5.72 MIL/uL — ABNORMAL HIGH (ref 3.87–5.11)
RDW: 14.4 % (ref 11.5–15.5)
WBC: 14.9 10*3/uL — ABNORMAL HIGH (ref 4.0–10.5)
nRBC: 0 % (ref 0.0–0.2)

## 2023-04-22 LAB — HCG, SERUM, QUALITATIVE: Preg, Serum: NEGATIVE

## 2023-04-22 LAB — TROPONIN I (HIGH SENSITIVITY): Troponin I (High Sensitivity): 4 ng/L (ref <18)

## 2023-04-22 NOTE — ED Triage Notes (Signed)
 Pt here from home with c/o sob and some slight chest pain over the last 24 hrs worse when she is lying back , no cough no fever

## 2023-04-22 NOTE — ED Notes (Addendum)
 Pt. Called to triage for vital signs, no answer x 2.

## 2023-04-23 ENCOUNTER — Ambulatory Visit (INDEPENDENT_AMBULATORY_CARE_PROVIDER_SITE_OTHER): Payer: Self-pay

## 2023-04-23 ENCOUNTER — Ambulatory Visit: Payer: Medicaid Other | Admitting: Physician Assistant

## 2023-04-23 ENCOUNTER — Encounter: Payer: Self-pay | Admitting: Physician Assistant

## 2023-04-23 VITALS — BP 146/94 | HR 84

## 2023-04-23 DIAGNOSIS — F17210 Nicotine dependence, cigarettes, uncomplicated: Secondary | ICD-10-CM | POA: Diagnosis not present

## 2023-04-23 DIAGNOSIS — E119 Type 2 diabetes mellitus without complications: Secondary | ICD-10-CM

## 2023-04-23 DIAGNOSIS — G43019 Migraine without aura, intractable, without status migrainosus: Secondary | ICD-10-CM

## 2023-04-23 DIAGNOSIS — Z7984 Long term (current) use of oral hypoglycemic drugs: Secondary | ICD-10-CM | POA: Diagnosis not present

## 2023-04-23 MED ORDER — ONDANSETRON 8 MG PO TBDP
8.0000 mg | ORAL_TABLET | Freq: Once | ORAL | Status: AC
  Administered 2023-04-23: 8 mg via ORAL

## 2023-04-23 MED ORDER — SUMATRIPTAN SUCCINATE 50 MG PO TABS: 50.0000 mg | ORAL_TABLET | ORAL | 0 refills | Status: DC | PRN

## 2023-04-23 MED ORDER — SUMATRIPTAN 20 MG/ACT NA SOLN
20.0000 mg | NASAL | Status: DC | PRN
  Administered 2023-04-23: 20 mg via NASAL

## 2023-04-23 MED ORDER — ONDANSETRON 4 MG PO TBDP: 4.0000 mg | ORAL_TABLET | Freq: Three times a day (TID) | ORAL | 0 refills | Status: DC | PRN

## 2023-04-23 MED ORDER — METFORMIN HCL 1000 MG PO TABS: ORAL_TABLET | ORAL | 0 refills | Status: DC

## 2023-04-23 NOTE — ED Notes (Signed)
 Called pt a 3x no response.

## 2023-04-23 NOTE — Progress Notes (Signed)
 Established Patient Office Visit  Subjective   Patient ID: [name], female    DOB: 18-Jan-1971  Age: 53 y.o. MRN: [id number]  Chief Complaint  Patient presents with   Hypertension    Pressure behind eyes, was seen in ED, but states she left    Headache   Discussed the use of AI scribe software for clinical note transcription with the patient, who gave verbal consent to proceed.  History of Present Illness      The patient, with a history of hypertension, presented with a two-day history of severe headache and pressure behind the eyes. They reported that the pain was so intense that it was not relieved by Excedrin, which they had taken that morning. The patient also noted that light exacerbated their discomfort. They have a history of migraines and the current symptoms were similar to previous episodes.  The patient has been monitoring their blood pressure at home, which was recorded as 177/98 prior to the visit. They are currently on Losartan  50mg  and Amlodipine  10mg  for hypertension management.  In addition to the headache, the patient reported feeling nauseous, denied vomiting.      Past Medical History:  Diagnosis Date   Anxiety    Common migraine with intractable migraine 10/02/2016   Depression    Diabetes mellitus    Hypertension    Social History   Socioeconomic History   Marital status: Single    Spouse name: Not on file   Number of children: Not on file   Years of education: Not on file   Highest education level: Not on file  Occupational History   Not on file  Tobacco Use   Smoking status: Every Day    Current packs/day: 0.25    Types: Cigarettes   Smokeless tobacco: Never  Vaping Use   Vaping status: Former  Substance and Sexual Activity   Alcohol use: No   Drug use: No   Sexual activity: Not on file  Other Topics Concern   Not on file  Social History Narrative   Right handed    Lives with daughter   Caffeine  use: Drinks coffee/tea/soda  sometimes   Social Drivers of Corporate Investment Banker Strain: Low Risk  (06/30/2022)   Overall Financial Resource Strain (CARDIA)    Difficulty of Paying Living Expenses: Not hard at all  Food Insecurity: Food Insecurity Present (04/20/2023)   Hunger Vital Sign    Worried About Running Out of Food in the Last Year: Never true    Ran Out of Food in the Last Year: Sometimes true  Transportation Needs: No Transportation Needs (04/20/2023)   PRAPARE - Administrator, Civil Service (Medical): No    Lack of Transportation (Non-Medical): No  Physical Activity: Sufficiently Active (06/30/2022)   Exercise Vital Sign    Days of Exercise per Week: 5 days    Minutes of Exercise per Session: 30 min  Stress: No Stress Concern Present (06/30/2022)   [employer] of Occupational Health - Occupational Stress Questionnaire    Feeling of Stress : Not at all  Social Connections: Socially Integrated (06/30/2022)   Social Connection and Isolation Panel [NHANES]    Frequency of Communication with Friends and Family: More than three times a week    Frequency of Social Gatherings with Friends and Family: More than three times a week    Attends Religious Services: 1 to 4 times per year    Active Member of [employer] or Organizations: Yes  Attends Club or Organization Meetings: 1 to 4 times per year    Marital Status: Living with partner  Intimate Partner Violence: Not At Risk (04/20/2023)   Humiliation, Afraid, Rape, and Kick questionnaire    Fear of Current or Ex-Partner: No    Emotionally Abused: No    Physically Abused: No    Sexually Abused: No   Family History  Problem Relation Age of Onset   Diabetes Mother    Hypertension Mother    Renal Disease Mother    Cancer Father    Diabetes Sister    Diabetes Brother    Renal Disease Brother    Diabetes Brother    Diabetes Sister    Diabetes Brother    Diabetes Brother    Diabetes Brother    Healthy Daughter    Healthy Daughter     Allergies  Allergen Reactions   Lisinopril      angioedema   Trulicity  [Dulaglutide ] Other (See Comments)    Gas, abdominal bloating    Review of Systems  Constitutional:  Negative for chills and fever.  Eyes:  Positive for photophobia.  Respiratory:  Negative for shortness of breath.   Cardiovascular:  Negative for chest pain.  Gastrointestinal:  Positive for nausea. Negative for vomiting.  Genitourinary: Negative.   Musculoskeletal: Negative.   Skin: Negative.   Neurological:  Positive for headaches. Negative for dizziness and weakness.  Endo/Heme/Allergies: Negative.   Psychiatric/Behavioral: Negative.        Objective:     BP (!) 146/94 (BP Location: Left Arm, Patient Position: Sitting, Cuff Size: Large)   Pulse 84   SpO2 96%  BP Readings from Last 3 Encounters:  04/23/23 (!) 146/94  04/22/23 (!) 165/96  04/20/23 (!) 153/89   Wt Readings from Last 3 Encounters:  04/20/23 236 lb 6.4 oz (107.2 kg)  03/13/23 230 lb (104.3 kg)  09/28/22 230 lb 12.8 oz (104.7 kg)    Physical Exam Vitals and nursing note reviewed.  Constitutional:      Appearance: Normal appearance.  HENT:     Head: Normocephalic and atraumatic.     Right Ear: External ear normal.     Nose: Nose normal.     Mouth/Throat:     Mouth: Mucous membranes are moist.     Pharynx: Oropharynx is clear.  Eyes:     Extraocular Movements: Extraocular movements intact.     Pupils: Pupils are equal, round, and reactive to light.  Cardiovascular:     Rate and Rhythm: Normal rate and regular rhythm.     Pulses: Normal pulses.     Heart sounds: Normal heart sounds.  Pulmonary:     Effort: Pulmonary effort is normal.     Breath sounds: Normal breath sounds.  Musculoskeletal:        General: Normal range of motion.     Cervical back: Normal range of motion and neck supple.  Skin:    General: Skin is warm and dry.  Neurological:     General: No focal deficit present.     Mental Status: She is alert and  oriented to person, place, and time.     Cranial Nerves: No cranial nerve deficit.     Sensory: No sensory deficit.  Psychiatric:        Mood and Affect: Mood normal.        Behavior: Behavior normal.        Thought Content: Thought content normal.        Judgment: Judgment  normal.      Assessment & Plan:   Problem List Items Addressed This Visit       Cardiovascular and Mediastinum   Common migraine with intractable migraine - Primary   Relevant Medications   SUMAtriptan  (IMITREX ) nasal spray 20 mg   SUMAtriptan  (IMITREX ) 50 MG tablet   ondansetron  (ZOFRAN -ODT) 4 MG disintegrating tablet     Endocrine   Type 2 diabetes mellitus without complication, without long-term current use of insulin  (HCC)   Relevant Medications   metFORMIN  (GLUCOPHAGE ) 1000 MG tablet   1. Common migraine with intractable migraine (Primary) Patient given sumatriptan  and Zofran  in clinic.  Patient education given on supportive care.  Red flags given for prompt reevaluation.  Trial sumatriptan , refill Zofran . - SUMAtriptan  (IMITREX ) nasal spray 20 mg - ondansetron  (ZOFRAN -ODT) disintegrating tablet 8 mg - SUMAtriptan  (IMITREX ) 50 MG tablet; Take 1 tablet (50 mg total) by mouth every 2 (two) hours as needed for migraine. May repeat in 2 hours if headache persists or recurs.  Dispense: 10 tablet; Refill: 0 - ondansetron  (ZOFRAN -ODT) 4 MG disintegrating tablet; Take 1 tablet (4 mg total) by mouth every 8 (eight) hours as needed.  Dispense: 20 tablet; Refill: 0  2. Type 2 diabetes mellitus without complication, with long-term current use of insulin  (HCC) Courtesy refill of metformin .  Continue follow-up with primary care provider - metFORMIN  (GLUCOPHAGE ) 1000 MG tablet; TAKE ONE TABLET BY MOUTH TWICE DAILY (AM+BEDTIME)  Dispense: 60 tablet; Refill: 0   I have reviewed the patient's medical history (PMH, PSH, Social History, Family History, Medications, and allergies) , and have been updated if relevant. I  spent 30 minutes reviewing chart and  face to face time with patient.   Return if symptoms worsen or fail to improve.    Kirk RAMAN Mayers, PA-C

## 2023-04-23 NOTE — Patient Instructions (Signed)
 You were given sumatriptan  nasal spray, along with Zofran .  If needed you can repeat the sumatriptan  hand in 2 hours, the Zofran  in 8 hours.  Make sure that you are staying well hydrated and get plenty of rest.  Please continue to check your blood pressure at home, keep a written log and have available for all office visits.  Please feel free to return to the mobile unit if needed.  Kirk CANDIE Sage, PA-C Physician Assistant [facility] [email or web address]   Migraine Headache A migraine headache is an intense pulsing or throbbing pain on one or both sides of the head. Migraine headaches may also cause other symptoms, such as nausea, vomiting, and sensitivity to light and noise. A migraine headache can last from 4 hours to 3 days. Talk with your health care provider about what things may bring on (trigger) your migraine headaches. What are the causes? The exact cause is not known. However, a migraine may be caused when nerves in the brain get irritated and release chemicals that cause blood vessels to become inflamed. This inflammation causes pain. Migraines may be triggered or caused by: Smoking. Medicines, such as: Nitroglycerin, which is used to treat chest pain. Birth control pills. Estrogen. Certain blood pressure medicines. Foods or drinks that contain nitrates, glutamate, aspartame, MSG, or tyramine. Certain foods or drinks, such as aged cheeses, chocolate, alcohol, or caffeine . Doing physical activity that is very hard. Other triggers may include: Menstruation. Pregnancy. Hunger. Stress. Getting too much or too little sleep. Weather changes. Tiredness (fatigue). What increases the risk? The following factors may make you more likely to have migraine headaches: Being between the ages of 61-73 years old. Being female. Having a family history of migraine headaches. Being Caucasian. Having a mental health condition, such  as depression or anxiety. Being obese. What are the signs or symptoms? The main symptom of this condition is pulsing or throbbing pain. This pain may: Happen in any area of the head, such as on one or both sides. Make it hard to do daily activities. Get worse with physical activity. Get worse around bright lights, loud noises, or smells. Other symptoms may include: Nausea. Vomiting. Dizziness. Before a migraine headache starts, you may get warning signs (an aura). An aura may include: Seeing flashing lights or having blind spots. Seeing bright spots, halos, or zigzag lines. Having tunnel vision or blurred vision. Having numbness or a tingling feeling. Having trouble talking. Having muscle weakness. After a migraine ends, you may have symptoms. These may include: Feeling tired. Trouble concentrating. How is this diagnosed? A migraine headache can be diagnosed based on: Your symptoms. A physical exam. Tests, such as: A CT scan or an MRI of the head. These tests can help rule out other causes of headaches. Taking fluid from the spine (lumbar puncture) to examine it (cerebrospinal fluid analysis, or CSF analysis). How is this treated? This condition may be treated with medicines that: Relieve pain and nausea. Prevent migraines. Treatment may also include: Acupuncture. Lifestyle changes like avoiding foods that trigger migraine headaches. Learning ways to control your body (biofeedback). Talk therapy to help you know and deal with negative thoughts (cognitive behavioral therapy). Follow these instructions at home: Medicines Take over-the-counter and prescription medicines only as told by your provider. Ask your provider if the medicine prescribed to you: Requires you to avoid driving or using machinery. Can cause constipation. You may need to take these actions to prevent or treat constipation: Drink enough fluid to keep your  pee (urine) pale yellow. Take over-the-counter or  prescription medicines. Eat foods that are high in fiber, such as beans, whole grains, and fresh fruits and vegetables. Limit foods that are high in fat and processed sugars, such as fried or sweet foods. Lifestyle  Do not drink alcohol. Do not use any products that contain nicotine  or tobacco. These products include cigarettes, chewing tobacco, and vaping devices, such as e-cigarettes. If you need help quitting, ask your provider. Get 7-9 hours of sleep each night, or the amount recommended by your provider. Find ways to manage stress, such as meditation, deep breathing, or yoga. Try to exercise regularly. This can help lessen how bad and how often your migraines occur. General instructions Keep a journal to find out what triggers your migraines, so you can avoid those things. For example, write down: What you eat and drink. How much sleep you get. Any change to your diet or medicines. If you have a migraine headache: Avoid things that make your symptoms worse, such as bright lights. Lie down in a dark, quiet room. Do not drive or use machinery. Ask your provider what activities are safe for you while you have symptoms. Keep all follow-up visits. Your provider will monitor your symptoms and recommend any further treatment. Where to find more information Coalition for Headache and Migraine Patients (CHAMP): headachemigraine.org American Migraine Foundation: americanmigrainefoundation.org National Headache Foundation: headaches.org Contact a health care provider if: You have symptoms that are different or worse than your usual migraine headache symptoms. You have more than 15 days of headaches in one month. Get help right away if: Your migraine headache becomes severe or lasts more than 72 hours. You have a fever or stiff neck. You have vision loss. Your muscles feel weak or like you cannot control them. You lose your balance often or have trouble walking. You faint. You have a  seizure. This information is not intended to replace advice given to you by your health care provider. Make sure you discuss any questions you have with your health care provider. Document Revised: 11/21/2021 Document Reviewed: 11/21/2021 Elsevier Patient Education  2024 [employer].

## 2023-04-23 NOTE — Telephone Encounter (Signed)
     Chief Complaint: BP 167/98. Headache. Went to ED last night but left because of wait time. Symptoms: Above Frequency: Yesterday Pertinent Negatives: Patient denies any other symptoms today. Disposition: [] ED /[] Urgent Care (no appt availability in office) / [] Appointment(In office/virtual)/ []  [place] [facility]/ [] Home Care/ [] Refused Recommended Disposition /[x] [facility]/ []  Follow-up with PCP Additional Notes: Agrees with Mobile Unit, no availability in practice.  Reason for Disposition  Systolic BP  >= 180 OR Diastolic >= 110  Answer Assessment - Initial Assessment Questions 1. BLOOD PRESSURE: What is the blood pressure? Did you take at least two measurements 5 minutes apart?     167/98 2. ONSET: When did you take your blood pressure?     Today 3. HOW: How did you take your blood pressure? (e.g., automatic home BP monitor, visiting nurse)     Home cuff 4. HISTORY: Do you have a history of high blood pressure?     Yes 5. MEDICINES: Are you taking any medicines for blood pressure? Have you missed any doses recently?     Yes 6. OTHER SYMPTOMS: Do you have any symptoms? (e.g., blurred vision, chest pain, difficulty breathing, headache, weakness)     Headache 7. PREGNANCY: Is there any chance you are pregnant? When was your last menstrual period?     No  Protocols used: Blood Pressure - High-A-AH

## 2023-04-24 ENCOUNTER — Encounter: Payer: Self-pay | Admitting: Physician Assistant

## 2023-04-27 ENCOUNTER — Telehealth (INDEPENDENT_AMBULATORY_CARE_PROVIDER_SITE_OTHER): Payer: Self-pay

## 2023-04-27 DIAGNOSIS — H538 Other visual disturbances: Secondary | ICD-10-CM | POA: Diagnosis not present

## 2023-04-27 DIAGNOSIS — Z7984 Long term (current) use of oral hypoglycemic drugs: Secondary | ICD-10-CM | POA: Diagnosis not present

## 2023-04-27 DIAGNOSIS — R519 Headache, unspecified: Secondary | ICD-10-CM | POA: Diagnosis not present

## 2023-04-27 DIAGNOSIS — E1165 Type 2 diabetes mellitus with hyperglycemia: Secondary | ICD-10-CM | POA: Diagnosis not present

## 2023-04-27 DIAGNOSIS — Z794 Long term (current) use of insulin: Secondary | ICD-10-CM | POA: Diagnosis not present

## 2023-04-27 DIAGNOSIS — H2513 Age-related nuclear cataract, bilateral: Secondary | ICD-10-CM | POA: Diagnosis not present

## 2023-04-27 NOTE — Telephone Encounter (Signed)
Copied from CRM [id number]. Topic: Referral - Request for Referral >> Apr 27, 2023  3:43 PM Marlow Baars wrote: Did the patient discuss referral with their provider in the last year? No (If No - schedule appointment) (If Yes - send message)  Appointment offered? No because she was seen recently  Type of order/referral and detailed reason for visit: Migraine Headaches  Preference of office, provider, location: Any neurologist her provider recommends that accepts her insurance  If referral order, have you been seen by this specialty before? No (If Yes, this issue or another issue? When? Where?  Can we respond through MyChart? No she would prefer a text if possible  Please assist patient further as she was seen recently on the mobile unit and had a terrible migraine and was given medicine. She also saw her eye doctor who recommended she contact her provider to get a referral to be evaluated by a neurologist . Please assist her further

## 2023-05-01 ENCOUNTER — Encounter (INDEPENDENT_AMBULATORY_CARE_PROVIDER_SITE_OTHER): Payer: Self-pay | Admitting: Primary Care

## 2023-05-01 ENCOUNTER — Other Ambulatory Visit (INDEPENDENT_AMBULATORY_CARE_PROVIDER_SITE_OTHER): Payer: Self-pay | Admitting: Primary Care

## 2023-05-01 DIAGNOSIS — G43009 Migraine without aura, not intractable, without status migrainosus: Secondary | ICD-10-CM

## 2023-05-08 ENCOUNTER — Other Ambulatory Visit: Payer: Self-pay

## 2023-05-08 NOTE — Patient Outreach (Signed)
  Medicaid Managed Care Social Work Note  05/08/2023 Name:  [name] MRN:  [id number] DOB:  1970/06/08  [name] is an 53 y.o. year old female who is a primary patient of Grayce Sessions, NP.  The Medicaid Managed Care Coordination team was consulted for assistance with:  Food [employer] Resources   [name] was given information about Medicaid Managed Care Coordination team services today. [name] Patient agreed to services and verbal consent obtained.  Engaged with patient  for by telephone forinitial visit in response to referral for case management and/or care coordination services.   Patient is participating in a Managed Medicaid Plan:  Yes  Assessments/Interventions:  Review of past medical history, allergies, medications, health status, including review of consultants reports, laboratory and other test data, was performed as part of comprehensive evaluation and provision of chronic care management services.  SDOH: (Social Drivers of Health) assessments and interventions performed: SDOH Interventions    Flowsheet Row Office Visit from 04/20/2023 in [facility]  SDOH Interventions   Food Insecurity Interventions AMB Referral  Housing Interventions Intervention Not Indicated  Transportation Interventions Intervention Not Indicated  Utilities Interventions Intervention Not Indicated      BSW completed a telephone outreach with patient, she states she lives alone and needs assistance with food, rent, and utilities. Patient states that she receives 291 monthly in foodstamps and receives SSI of 841 each month and her rent is 700. BSW and patient agreed for resources to be mailed.  Advanced Directives Status:  Not addressed in this encounter.  Care Plan                 Allergies  Allergen Reactions   Lisinopril     angioedema   Trulicity [Dulaglutide] Other (See Comments)    Gas, abdominal bloating    Medications  Reviewed Today   Medications were not reviewed in this encounter     Patient Active Problem List   Diagnosis Date Noted   Osteoarthritis of knees, bilateral 04/17/2022   Tobacco use 02/06/2022   MDD (recurrent major depressive disorder) in remission (HCC) 05/13/2020   Grief reaction with prolonged bereavement 05/13/2020   Hot flashes 10/02/2018   Vaginal dryness 10/02/2018   BIH (benign intracranial hypertension) 11/06/2017   Somnolence, daytime 07/30/2017   Snoring 07/30/2017   Morbid obesity (HCC) 07/30/2017   Migraine 10/02/2016   Common migraine with intractable migraine 10/02/2016   Type 2 diabetes mellitus without complication, without long-term current use of insulin (HCC) 08/17/2016    Conditions to be addressed/monitored per PCP order:   community resources  There are no care plans that you recently modified to display for this patient.   Follow up:  Patient agrees to Care Plan and Follow-up.  Plan: The Managed Medicaid care management team will reach out to the patient again over the next 30 days.  Date/time of next scheduled Social Work care management/care coordination outreach:  06/08/23  [name], [name], [facility]  Managed [facility] Social Worker [phone]

## 2023-05-08 NOTE — Patient Instructions (Signed)
Visit Information  Ms. Caldwell was given information about Medicaid Managed Care team care coordination services as a part of their [employer] Medicaid benefit. [name] verbally consentedto engagement with the [facility] Managed Care team.   If you are experiencing a medical emergency, please call 911 or report to your local emergency department or urgent care.   If you have a non-emergency medical problem during routine business hours, please contact your provider's office and ask to speak with a nurse.   For questions related to your [facility] health plan, please call: [phone]  OR visit the member homepage at: [email or web address]  If you would like to schedule transportation through your [facility] plan, please call the following number at least 2 days in advance of your appointment: [phone]  If you are experiencing a behavioral health crisis, call the [facility] Crisis Line at [phone]). The line is available 24 hours a day, seven days a week.  If you would like help to quit smoking, call 1-800-QUIT-NOW ((705) 774-7544) OR Espaol: 1-855-Djelo-Ya (5-638-756-4332) o para ms informacin haga clic aqu or Text READY to 951-884 to register via text   Ms. Vest - following are the goals we discussed in your visit today:   Goals Addressed   None       Social Worker will follow up in 30 days.   Gus Puma, Kenard Gower, MHA [facility]  Managed Medicaid Social Worker [phone]   Following is a copy of your plan of care:  There are no care plans that you recently modified to display for this patient.

## 2023-05-18 DIAGNOSIS — G4733 Obstructive sleep apnea (adult) (pediatric): Secondary | ICD-10-CM | POA: Diagnosis not present

## 2023-05-25 ENCOUNTER — Encounter (HOSPITAL_COMMUNITY): Payer: Self-pay | Admitting: Psychiatry

## 2023-05-25 ENCOUNTER — Telehealth (HOSPITAL_COMMUNITY): Payer: Medicaid Other | Admitting: Psychiatry

## 2023-05-25 DIAGNOSIS — F4329 Adjustment disorder with other symptoms: Secondary | ICD-10-CM

## 2023-05-25 DIAGNOSIS — F411 Generalized anxiety disorder: Secondary | ICD-10-CM

## 2023-05-25 DIAGNOSIS — R41840 Attention and concentration deficit: Secondary | ICD-10-CM

## 2023-05-25 DIAGNOSIS — F3341 Major depressive disorder, recurrent, in partial remission: Secondary | ICD-10-CM

## 2023-05-25 MED ORDER — DULOXETINE HCL 60 MG PO CPEP
ORAL_CAPSULE | ORAL | 3 refills | Status: DC
Start: 2023-05-25 — End: 2023-08-17

## 2023-05-25 MED ORDER — NICOTINE POLACRILEX 2 MG MT GUM: 2.0000 mg | CHEWING_GUM | OROMUCOSAL | 5 refills | Status: DC | PRN

## 2023-05-25 MED ORDER — ATOMOXETINE HCL 40 MG PO CAPS: 40.0000 mg | ORAL_CAPSULE | Freq: Every day | ORAL | 3 refills | Status: DC

## 2023-05-25 MED ORDER — TRAZODONE HCL 100 MG PO TABS: 100.0000 mg | ORAL_TABLET | Freq: Every day | ORAL | 3 refills | Status: DC

## 2023-05-25 MED ORDER — NICOTINE 7 MG/24HR TD PT24: 7.0000 mg | MEDICATED_PATCH | Freq: Every day | TRANSDERMAL | 5 refills | Status: DC

## 2023-05-25 MED ORDER — HYDROXYZINE HCL 10 MG PO TABS: 10.0000 mg | ORAL_TABLET | Freq: Three times a day (TID) | ORAL | 3 refills | Status: DC | PRN

## 2023-05-25 NOTE — Progress Notes (Signed)
BH MD/PA/NP OP Progress Note Virtual Visit via Video Note  I connected with [name] on 05/25/23 at 10:30 AM EST by a video enabled telemedicine application and verified that I am speaking with the correct person using two identifiers.  Location: Patient: Home Provider: Clinic   I discussed the limitations of evaluation and management by telemedicine and the availability of in person appointments. The patient expressed understanding and agreed to proceed.  I provided 30 minutes of non-face-to-face time during this encounter.    05/25/2023 10:34 AM [name]  MRN:  [id number]  Chief Complaint: "My headaches are horrible"  HPI: 53 year old female seen today for follow-up psychiatric evaluation.   She has a psychiatric history of tobacco dependence (in remission) depression and prolonged grief.  Currently she is managed on Strattera 40 mg daily, Nicoderm gum, and Nicorette 7 mg patches,trazodone 100 mg nightly hydroxyzine 10 mg three times daily as needed, and Cymbalta 60 mg twice daily.  She notes her medications are effective in managing her psychiatric conditions.  Today patient was well-groomed, pleasant, cooperative, and engaged in conversation.  She informed Clinical research associate that her headaches or horrible. She does note that her PCP prescribed medications to manage it but reports that she has ran out. She reports she will see her PCP soon.  Since her last visit she notes that her anxiety has increased. She reports that she worries about her headaches, health, and finding a new home that is affordable.  Today provider conducted a GAD-7 and patient scored a 10, at her last visit she scored a 2.  Provider also conducted PHQ-9 the patient scored a 4, at her last visit she scored a 10.  She endorses adequate sleep and appetite.  Today she denies SI/HI/AVH, mania, paranoia.    No medication changes made today.  Patient agreeable to continue medication as prescribed.  No other concerns noted  at this time.    Visit Diagnosis:    ICD-10-CM   1. Generalized anxiety disorder  F41.1 DULoxetine (CYMBALTA) 60 MG capsule    hydrOXYzine (ATARAX) 10 MG tablet    2. MDD (major depressive disorder), recurrent, in partial remission (HCC)  F33.41 DULoxetine (CYMBALTA) 60 MG capsule    traZODone (DESYREL) 100 MG tablet    3. 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 DULoxetine (CYMBALTA) 60 MG capsule    4. Poor concentration  R41.840 atomoxetine (STRATTERA) 40 MG capsule         Past Psychiatric History:  MDD, grief with prolonged bereavement reaction, tobacco dependence  Past Medical History:  Past Medical History:  Diagnosis Date   Anxiety    Common migraine with intractable migraine 10/02/2016   Depression    Diabetes mellitus    Hypertension     Past Surgical History:  Procedure Laterality Date   CHOLECYSTECTOMY     TUBAL LIGATION      Family Psychiatric History: Denied any family psychiatric history  Family History:  Family History  Problem Relation Age of Onset   Diabetes Mother    Hypertension Mother    Renal Disease Mother    Cancer Father    Diabetes Sister    Diabetes Brother    Renal Disease Brother    Diabetes Brother    Diabetes Sister    Diabetes Brother    Diabetes Brother    Diabetes Brother    Healthy Daughter    Healthy Daughter     Social History:  Social History   Socioeconomic History  Marital status: Single    Spouse name: Not on file   Number of children: Not on file   Years of education: Not on file   Highest education level: Not on file  Occupational History   Not on file  Tobacco Use   Smoking status: Every Day    Current packs/day: 0.25    Types: Cigarettes   Smokeless tobacco: Never  Vaping Use   Vaping status: Former  Substance and Sexual Activity   Alcohol use: No   Drug use: No   Sexual activity: Not on file  Other Topics Concern   Not on file  Social History Narrative   Right handed    Lives with  daughter   Caffeine use: Drinks coffee/tea/soda sometimes   Social Drivers of Corporate investment banker Strain: Low Risk  (06/30/2022)   Overall Financial Resource Strain (CARDIA)    Difficulty of Paying Living Expenses: Not hard at all  Food Insecurity: Food Insecurity Present (04/20/2023)   Hunger Vital Sign    Worried About Running Out of Food in the Last Year: Never true    Ran Out of Food in the Last Year: Sometimes true  Transportation Needs: No Transportation Needs (04/20/2023)   PRAPARE - Administrator, Civil Service (Medical): No    Lack of Transportation (Non-Medical): No  Physical Activity: Sufficiently Active (06/30/2022)   Exercise Vital Sign    Days of Exercise per Week: 5 days    Minutes of Exercise per Session: 30 min  Stress: No Stress Concern Present (06/30/2022)   [employer] of Occupational Health - Occupational Stress Questionnaire    Feeling of Stress : Not at all  Social Connections: Socially Integrated (06/30/2022)   Social Connection and Isolation Panel [NHANES]    Frequency of Communication with Friends and Family: More than three times a week    Frequency of Social Gatherings with Friends and Family: More than three times a week    Attends Religious Services: 1 to 4 times per year    Active Member of [employer] or Organizations: Yes    Attends Banker Meetings: 1 to 4 times per year    Marital Status: Living with partner    Allergies:  Allergies  Allergen Reactions   Lisinopril     angioedema   Trulicity [Dulaglutide] Other (See Comments)    Gas, abdominal bloating    Metabolic Disorder Labs: Lab Results  Component Value Date   HGBA1C 10.1 (A) 04/20/2023   No results found for: "PROLACTIN" Lab Results  Component Value Date   CHOL 195 05/29/2022   TRIG 103 05/29/2022   HDL 45 05/29/2022   CHOLHDL 4.3 05/29/2022   LDLCALC 131 (H) 05/29/2022   LDLCALC 158 (H) 01/07/2021   Lab Results  Component Value Date   TSH  2.870 10/02/2018   TSH 4.840 (H) 01/09/2018    Therapeutic Level Labs: No results found for: "LITHIUM" No results found for: "VALPROATE" No results found for: "CBMZ"  Current Medications: Current Outpatient Medications  Medication Sig Dispense Refill   Accu-Chek Softclix Lancets lancets Use to check blood sugar three times daily. 100 each 6   albuterol (VENTOLIN HFA) 108 (90 Base) MCG/ACT inhaler Inhale 1-2 puffs into the lungs every 6 (six) hours as needed for wheezing or shortness of breath. 8 each 0   albuterol (VENTOLIN HFA) 108 (90 Base) MCG/ACT inhaler Inhale 1-2 puffs into the lungs every 6 (six) hours as needed for up to 14  days for wheezing or shortness of breath. 1 each 0   amLODipine (NORVASC) 10 MG tablet TAKE 1 TABLET (10 MG TOTAL) BY MOUTH DAILY. (AM) 90 tablet 0   aspirin EC 81 MG tablet Take 81 mg by mouth every 6 (six) hours as needed for mild pain.      atomoxetine (STRATTERA) 40 MG capsule Take 1 capsule (40 mg total) by mouth daily. 30 capsule 3   blood glucose meter kit and supplies Dispense based on patient and insurance preference. Use up to four times daily as directed. (FOR ICD-10 E10.9, E11.9). 1 each 0   Blood Glucose Monitoring Suppl (ACCU-CHEK GUIDE) w/Device KIT Use to check blood sugar three times daily. 1 kit 0   celecoxib (CELEBREX) 200 MG capsule TAKE ONE CAPSULE BY MOUTH TWICE DAILY (AM+BEDTIME) 180 capsule 1   Continuous Blood Gluc Receiver (FREESTYLE LIBRE 2 READER) DEVI Use to check blood sugar continuously throughout the day. Change sensors once every 14 days. E11.9 1 each 0   Continuous Blood Gluc Sensor (FREESTYLE LIBRE 2 SENSOR) MISC Use to check blood sugar continuously throughout the day. Change sensors once every 14 days. E11.9 2 each 3   dicyclomine (BENTYL) 20 MG tablet Take 1 tablet (20 mg total) by mouth 2 (two) times daily. 20 tablet 0   DULoxetine (CYMBALTA) 60 MG capsule TAKE 1 CAPSULE (60 MG TOTAL) BY MOUTH 2 (TWO) TIMES DAILY (AM+BEDTIME)  90 capsule 3   EASY COMFORT PEN NEEDLES 31G X 8 MM MISC USE TO INJECT LEVEMIR TWICE A DAY 100 each 2   fluconazole (DIFLUCAN) 150 MG tablet Take 1 tablet (150 mg total) by mouth every 3 (three) days. (Patient not taking: Reported on 04/23/2023) 2 tablet 0   fluticasone (FLONASE) 50 MCG/ACT nasal spray PLACE 2 SPRAYS INTO BOTH NOSTRILS DAILY. 16 g 2   fluticasone (FLOVENT HFA) 44 MCG/ACT inhaler Inhale 2 puffs into the lungs 2 (two) times daily. 1 Inhaler 12   gabapentin (NEURONTIN) 300 MG capsule TAKE ONE CAPSULE BY MOUTH THREE TIMES DAILY ( AM+NOON+BEDTIME) 90 capsule 0   glucose blood (ACCU-CHEK GUIDE) test strip Use to check blood sugar three times daily. 100 each 6   HYDROcodone-acetaminophen (NORCO) 5-325 MG tablet Take 1 tablet by mouth every 4 (four) hours as needed for moderate pain. 10 tablet 0   hydrOXYzine (ATARAX) 10 MG tablet Take 1 tablet (10 mg total) by mouth 3 (three) times daily as needed. 90 tablet 3   insulin glargine (LANTUS SOLOSTAR) 100 UNIT/ML Solostar Pen Inject 56 Units into the skin daily. 45 mL 1   lidocaine (XYLOCAINE) 2 % jelly Place 1 Application into the urethra as needed. 30 mL 0   losartan (COZAAR) 50 MG tablet TAKE 1 TABLET (50 MG TOTAL) BY MOUTH DAILY (AM) 90 tablet 1   metFORMIN (GLUCOPHAGE) 1000 MG tablet TAKE ONE TABLET BY MOUTH TWICE DAILY (AM+BEDTIME) 60 tablet 0   nicotine (NICODERM CQ - DOSED IN MG/24 HR) 7 mg/24hr patch Place 1 patch (7 mg total) onto the skin daily. Remove before bedtime. 28 patch 5   nicotine polacrilex (NICORETTE) 2 MG gum Take 1 each (2 mg total) by mouth as needed for smoking cessation. Use every 1-2 hours as needed. 100 tablet 5   ondansetron (ZOFRAN) 4 MG tablet Take 1 tablet (4 mg total) by mouth every 8 (eight) hours as needed for nausea or vomiting. 20 tablet 0   ondansetron (ZOFRAN-ODT) 4 MG disintegrating tablet Take 1 tablet (4 mg total)  by mouth every 8 (eight) hours as needed. 20 tablet 0   pantoprazole (PROTONIX) 40 MG  tablet TAKE 1 TABLET (40 MG TOTAL) BY MOUTH DAILY (AM) 30 tablet 3   pantoprazole (PROTONIX) 40 MG tablet Take 1 tablet (40 mg total) by mouth daily. 30 tablet 0   pravastatin (PRAVACHOL) 40 MG tablet TAKE 1 TABLET (40 MG TOTAL) BY MOUTH DAILY (BEDTIME) 90 tablet 1   Semaglutide, 2 MG/DOSE, 8 MG/3ML SOPN Inject 2 mg as directed once a week. 9 mL 2   SUMAtriptan (IMITREX) 50 MG tablet Take 1 tablet (50 mg total) by mouth every 2 (two) hours as needed for migraine. May repeat in 2 hours if headache persists or recurs. 10 tablet 0   traZODone (DESYREL) 100 MG tablet Take 1 tablet (100 mg total) by mouth at bedtime. 30 tablet 3   Current Facility-Administered Medications  Medication Dose Route Frequency Provider Last Rate Last Admin   SUMAtriptan (IMITREX) nasal spray 20 mg  20 mg Nasal Q2H PRN    20 mg at 04/23/23 1525     Musculoskeletal: Strength & Muscle Tone: within normal limits and telehealth visit Gait & Station: normal, telehealth visit Patient leans: N/A  Psychiatric Specialty Exam: Review of Systems  There were no vitals taken for this visit.There is no height or weight on file to calculate BMI.  General Appearance: Well Groomed  Eye Contact:  Good  Speech:  Clear and Coherent and Normal Rate  Volume:  Normal  Mood:  Euthymic, mild anxiety related to headaches  Affect:  Appropriate and Congruent  Thought Process:  Coherent, Goal Directed, and Linear  Orientation:  Full (Time, Place, and Person)  Thought Content: WDL and Logical   Suicidal Thoughts:  No  Homicidal Thoughts:  No  Memory:  Immediate;   Good Recent;   Good Remote;   Good  Judgement:  Good  Insight:  Good  Psychomotor Activity:  Normal  Concentration:  Concentration: Good and Attention Span: Good  Recall:  Good  Fund of Knowledge: Good  Language: Good  Akathisia:  No  Handed:  Right  AIMS (if indicated): not done  Assets:  Communication Skills Desire for Improvement Financial  Resources/Insurance Housing Leisure Time Physical Health Social Support  ADL's:  Intact  Cognition: WNL  Sleep:  Good   Screenings: GAD-7    Flowsheet Row Video Visit from 05/25/2023 in [facility] Office Visit from 04/23/2023 in [place] [facility] Video Visit from 03/30/2023 in [facility] Video Visit from 10/13/2022 in [facility] Video Visit from 08/04/2022 in [facility]  Total GAD-7 Score 10 0 2 11 9       PHQ2-9    Flowsheet Row Video Visit from 05/25/2023 in [facility] Office Visit from 04[facility]23/2023 in [place] [facility] Office Visit from 04/20/2023 in [facility] Video Visit from 03/30/2023 in [facility] Video Visit from 10/13/2022 in [facility]  PHQ-2 Total Score 0 0 1 3 2   PHQ-9 Total Score 4 5 -- 10 11      Flowsheet Row ED from 04/22/2023 in [facility] Emergency Department at [facility] ED from 02/26/2023 in [facility] Emergency Department at [facility] ED from 07/07/2022 in [facility] Urgent Care at [facility] RISK CATEGORY No Risk No Risk No Risk  Assessment and Plan: Patient reports that she has mild anxiety related to headaches and finding a new affordable home. She however reports that she is able to cope with this.  No medication changes made today.  Patient agreeable to taking medications as prescribed.  1. Generalized anxiety disorder  Continue- DULoxetine (CYMBALTA) 60 MG capsule; TAKE 1 CAPSULE (60 MG TOTAL) BY MOUTH 2 (TWO) TIMES DAILY (AM+BEDTIME)  Dispense: 90 capsule; Refill: 3 Continue- hydrOXYzine (ATARAX) 10 MG tablet; Take 1 tablet (10 mg total) by mouth 3 (three) times daily as needed.  Dispense: 90 tablet; Refill: 3  2. MDD (major depressive disorder), recurrent, in partial remission  (HCC)  Continue- DULoxetine (CYMBALTA) 60 MG capsule; TAKE 1 CAPSULE (60 MG TOTAL) BY MOUTH 2 (TWO) TIMES DAILY (AM+BEDTIME)  Dispense: 90 capsule; Refill: 3 Continue- traZODone (DESYREL) 100 MG tablet; Take 1 tablet (100 mg total) by mouth at bedtime.  Dispense: 30 tablet; Refill: 3  3. Grief reaction with prolonged bereavement  Continue- DULoxetine (CYMBALTA) 60 MG capsule; TAKE 1 CAPSULE (60 MG TOTAL) BY MOUTH 2 (TWO) TIMES DAILY (AM+BEDTIME)  Dispense: 90 capsule; Refill: 3  4. Poor concentration  Continue- atomoxetine (STRATTERA) 40 MG capsule; Take 1 capsule (40 mg total) by mouth daily.  Dispense: 30 capsule; Refill: 3    Follow-up in 2 months Shanna Cisco, NP 05/25/2023, 10:34 AM

## 2023-06-06 ENCOUNTER — Other Ambulatory Visit: Payer: Self-pay | Admitting: Family Medicine

## 2023-06-07 DIAGNOSIS — H5213 Myopia, bilateral: Secondary | ICD-10-CM | POA: Diagnosis not present

## 2023-06-07 DIAGNOSIS — H524 Presbyopia: Secondary | ICD-10-CM | POA: Diagnosis not present

## 2023-06-07 DIAGNOSIS — H52223 Regular astigmatism, bilateral: Secondary | ICD-10-CM | POA: Diagnosis not present

## 2023-06-08 ENCOUNTER — Other Ambulatory Visit: Payer: Self-pay

## 2023-06-08 NOTE — Patient Instructions (Signed)
 Visit Information  [name] was given information about Medicaid Managed Care team care coordination services as a part of their [employer] Medicaid benefit. [name] verbally consentedto engagement with the [facility] Managed Care team.   If you are experiencing a medical emergency, please call 911 or report to your local emergency department or urgent care.   If you have a non-emergency medical problem during routine business hours, please contact your provider's office and ask to speak with a nurse.   For questions related to your [facility] health plan, please call: [phone]  OR visit the member homepage at: [email or web address]  If you would like to schedule transportation through your [facility] plan, please call the following number at least 2 days in advance of your appointment: [phone]  If you are experiencing a behavioral health crisis, call the [facility] Crisis Line at [phone]). The line is available 24 hours a day, seven days a week.  If you would like help to quit smoking, call 1-800-QUIT-NOW (858-607-6551) OR Espaol: 1-855-Djelo-Ya (6-644-034-7425) o para ms informacin haga clic aqu or Text READY to 956-387 to register via text   Ms. Klahn - following are the goals we discussed in your visit today:   Goals Addressed   None       Social Worker will follow up.   Gus Puma, Kenard Gower, MHA [facility]  Managed Medicaid Social Worker [phone]   Following is a copy of your plan of care:  There are no care plans that you recently modified to display for this patient.

## 2023-06-08 NOTE — Patient Outreach (Signed)
  Medicaid Managed Care Social Work Note  06/08/2023 Name:  [name] MRN:  [id number] DOB:  [birth year]  [name] is an 53 y.o. year old female who is a primary patient of [name] Sessions, NP.  The Medicaid Managed Care Coordination team was consulted for assistance with:  Community Resources   [name] was given information about Medicaid Managed Care Coordination team services today. [name] Patient agreed to services and verbal consent obtained.  Engaged with patient  for by telephone forfollow up visit in response to referral for case management and/or care coordination services.   Patient is participating in a Managed Medicaid Plan:  Yes  Assessments/Interventions:  Review of past medical history, allergies, medications, health status, including review of consultants reports, laboratory and other test data, was performed as part of comprehensive evaluation and provision of chronic care management services.  SDOH: (Social Drivers of Health) assessments and interventions performed: SDOH Interventions    Flowsheet Row Office Visit from 04/20/2023 in [facility]  SDOH Interventions   Food Insecurity Interventions AMB Referral  Housing Interventions Intervention Not Indicated  Transportation Interventions Intervention Not Indicated  Utilities Interventions Intervention Not Indicated     BSW completed a telephone outreach with patient, she states she did not receieve the resources BSW sent and her gas bill is about $600. BSW will resend resources, patient wanted them to be mailed. No additiaonl resources are needed at this time.   Advanced Directives Status:  Not addressed in this encounter.  Care Plan                 Allergies  Allergen Reactions   Lisinopril     angioedema   Trulicity [Dulaglutide] Other (See Comments)    Gas, abdominal bloating    Medications Reviewed Today   Medications were not reviewed in this encounter      Patient Active Problem List   Diagnosis Date Noted   Osteoarthritis of knees, bilateral 04/17/2022   Tobacco use 02/06/2022   MDD (recurrent major depressive disorder) in remission (HCC) 05/13/2020   Grief reaction with prolonged bereavement 05/13/2020   Hot flashes 10/02/2018   Vaginal dryness 10/02/2018   BIH (benign intracranial hypertension) 11/06/2017   Somnolence, daytime 07/30/2017   Snoring 07/30/2017   Morbid obesity (HCC) 07/30/2017   Migraine 10/02/2016   Common migraine with intractable migraine 10/02/2016   Type 2 diabetes mellitus without complication, without long-term current use of insulin (HCC) 08/17/2016    Conditions to be addressed/monitored per PCP order:   community resources  There are no care plans that you recently modified to display for this patient.   Follow up:  Patient agrees to Care Plan and Follow-up.  Plan: The Managed Medicaid care management team will reach out to the patient again over the next 30 days.  Date/time of next scheduled Social Work care management/care coordination outreach:  07/11/23  [name], Kenard Gower, [facility] Health  Managed [facility] Social Worker [phone]

## 2023-06-11 ENCOUNTER — Other Ambulatory Visit (INDEPENDENT_AMBULATORY_CARE_PROVIDER_SITE_OTHER): Payer: Self-pay | Admitting: Primary Care

## 2023-06-11 ENCOUNTER — Ambulatory Visit (INDEPENDENT_AMBULATORY_CARE_PROVIDER_SITE_OTHER): Payer: Self-pay | Admitting: Primary Care

## 2023-06-11 ENCOUNTER — Encounter (INDEPENDENT_AMBULATORY_CARE_PROVIDER_SITE_OTHER): Payer: Self-pay | Admitting: Primary Care

## 2023-06-11 DIAGNOSIS — G43019 Migraine without aura, intractable, without status migrainosus: Secondary | ICD-10-CM

## 2023-06-11 MED ORDER — SUMATRIPTAN SUCCINATE 50 MG PO TABS: 50.0000 mg | ORAL_TABLET | ORAL | 0 refills | Status: DC | PRN

## 2023-06-11 NOTE — Telephone Encounter (Signed)
 Requested medication (s) are due for refill today: yes  Requested medication (s) are on the active medication list: yes  Last refill:  04/23/23 #10 0 refills  Future visit scheduled: no   Notes to clinic:  last ordered by Maurene Capes, PA  . Patient out of medication . Do you want to refill Rx?     Requested Prescriptions  Pending Prescriptions Disp Refills   SUMAtriptan (IMITREX) 50 MG tablet 10 tablet 0    Sig: Take 1 tablet (50 mg total) by mouth every 2 (two) hours as needed for migraine. May repeat in 2 hours if headache persists or recurs.     Neurology:  Migraine Therapy - Triptan Failed - 06/11/2023  1:40 PM      Failed - Last BP in normal range    BP Readings from Last 1 Encounters:  04/23/23 (!) 146/94         Passed - Valid encounter within last 12 months    Recent Outpatient Visits           1 month ago Type 2 diabetes mellitus without complication, without long-term current use of insulin (HCC)   [place] [facility] Grayce Sessions, NP   6 months ago Primary osteoarthritis of both knees   [place] [facility] Grayce Sessions, NP   8 months ago Type 2 diabetes mellitus without complication, without long-term current use of insulin (HCC)   [place] [facility] Grayce Sessions, NP   10 months ago Type 2 diabetes mellitus without complication, without long-term current use of insulin (HCC)   [facility] [place]. [facility] Lois Huxley, [place] L, RPH-CPP   11 months ago Type 2 diabetes mellitus without complication, with long-term current use of insulin (HCC)   [place] [facility] Of [place]. [facility] Drucilla Chalet, RPH-CPP

## 2023-06-11 NOTE — Telephone Encounter (Signed)
 Copied from CRM [id number]. Topic: Clinical - Medication Refill >> Jun 11, 2023  1:11 PM Brittney F wrote: Most Recent Primary Care Visit:  Provider: Grayce Sessions  [facility]  Visit Type: OFFICE VISIT  Date: 04/20/2023  Medication: SUMAtriptan (IMITREX) 50 MG tablet   Has the patient contacted their pharmacy? Yes   Is this the correct pharmacy for this prescription? Yes If no, delete pharmacy and type the correct one.  This is the patient's preferred pharmacy:  [facility] - [place], [place] - [address] [place] [address] Phone: [phone] Fax: [phone]   Has the prescription been filled recently? No  Is the patient out of the medication? Yes  Has the patient been seen for an appointment in the last year OR does the patient have an upcoming appointment? Yes  Can we respond through MyChart? No  The patient would like the office to call her when the prescription has been refilled. PH: [phone]   Agent: Please be advised that Rx refills may take up to 3 business days. We ask that you follow-up with your pharmacy.

## 2023-06-11 NOTE — Telephone Encounter (Signed)
  Chief Complaint: headache , blurred vision requesting medication imitrex Symptoms: frontal headache constant, blurred vision. Out of medicaiton imitrex for couple of weeks. Has been taking regular medications for BP. Patient unable to check BP due to driving at this time. C/o nausea Frequency: 2 days  Pertinent Negatives: Patient denies weakness of either side of body no c/o chest pain no difficulty breathing no N/T  Disposition: [] ED /[] Urgent Care (no appt availability in office) / [] Appointment(In office/virtual)/ []  [place] [facility]/ [] Home Care/ [x] Refused Recommended Disposition /[] [place] [facility]/ []  Follow-up with PCP Additional Notes:   Patient reports she is going home now and will check BP and call back to reports. Recommended seeing PCP. No available appt today or until March 10. Patient reports she only wants refills of imitrex pills and nasal spray because she feels being out of medication is causing sx. Please advise. Recommended to get BP checked at mobile bus today and patient declined     Copied from CRM [id number]. Topic: Clinical - Red Word Triage >> Jun 11, 2023  1:17 PM Brittney F wrote: [facility] that prompted transfer to Nurse Triage: Extreme Pain ( Headache) accompanied by blurred. Reason for Disposition  [1] MODERATE headache (e.g., interferes with normal activities) AND [2] present > 24 hours AND [3] unexplained  (Exceptions: analgesics not tried, typical migraine, or headache part of viral illness)  Answer Assessment - Initial Assessment Questions 1. LOCATION: "Where does it hurt?"      Pain in front above of eyes  2. ONSET: "When did the headache start?" (Minutes, hours or days)      2 days  3. PATTERN: "Does the pain come and go, or has it been constant since it started?"     constant 4. SEVERITY: "How bad is the pain?" and "What does it keep you from doing?"  (e.g., Scale 1-10; mild, moderate, or severe)   - MILD (1-3): doesn't interfere with  normal activities    - MODERATE (4-7): interferes with normal activities or awakens from sleep    - SEVERE (8-10): excruciating pain, unable to do any normal activities        Can sleep but head pain worse this am  5. RECURRENT SYMPTOM: "Have you ever had headaches before?" If Yes, ask: "When was the last time?" and "What happened that time?"      Yes takes imitrex 6. CAUSE: "What do you think is causing the headache?"     Out of medication imitrex 7. MIGRAINE: "Have you been diagnosed with migraine headaches?" If Yes, ask: "Is this headache similar?"      Yes  8. HEAD INJURY: "Has there been any recent injury to the head?"      na 9. OTHER SYMPTOMS: "Do you have any other symptoms?" (fever, stiff neck, eye pain, sore throat, cold symptoms)     Frontal headache and blurred vision 10. PREGNANCY: "Is there any chance you are pregnant?" "When was your last menstrual period?"       na  Protocols used: [facility]

## 2023-06-28 ENCOUNTER — Other Ambulatory Visit (INDEPENDENT_AMBULATORY_CARE_PROVIDER_SITE_OTHER): Payer: Self-pay | Admitting: Primary Care

## 2023-06-28 DIAGNOSIS — E119 Type 2 diabetes mellitus without complications: Secondary | ICD-10-CM

## 2023-06-28 NOTE — Telephone Encounter (Unsigned)
 Copied from CRM [id number]. Topic: Clinical - Medication Refill >> Jun 28, 2023  3:24 PM Carlatta H wrote: Most Recent Primary Care Visit:  Provider: Grayce Sessions  [facility]  Visit Type: OFFICE VISIT  Date: 04/20/2023  Medication: metFORMIN (GLUCOPHAGE) 1000 MG tablet [[id number]]  Has the patient contacted their pharmacy? Yes (Agent: If no, request that the patient contact the pharmacy for the refill. If patient does not wish to contact the pharmacy document the reason why and proceed with request.) (Agent: If yes, when and what did the pharmacy advise?)  Is this the correct pharmacy for this prescription? Yes If no, delete pharmacy and type the correct one.  This is the patient's preferred pharmacy:  [facility] - [place], [place] - [address] [place] [address] Phone: [phone] Fax: [phone]   Has the prescription been filled recently? No  Is the patient out of the medication? Yes  Has the patient been seen for an appointment in the last year OR does the patient have an upcoming appointment? Yes  Can we respond through MyChart? Yes  Agent: Please be advised that Rx refills may take up to 3 business days. We ask that you follow-up with your pharmacy.

## 2023-06-29 ENCOUNTER — Other Ambulatory Visit (INDEPENDENT_AMBULATORY_CARE_PROVIDER_SITE_OTHER): Payer: Self-pay | Admitting: Primary Care

## 2023-06-29 DIAGNOSIS — I10 Essential (primary) hypertension: Secondary | ICD-10-CM

## 2023-06-29 DIAGNOSIS — E785 Hyperlipidemia, unspecified: Secondary | ICD-10-CM

## 2023-06-29 MED ORDER — METFORMIN HCL 1000 MG PO TABS: ORAL_TABLET | ORAL | 0 refills | Status: DC

## 2023-06-29 NOTE — Telephone Encounter (Signed)
 Requested Prescriptions  Pending Prescriptions Disp Refills   metFORMIN (GLUCOPHAGE) 1000 MG tablet 60 tablet 0    Sig: TAKE ONE TABLET BY MOUTH TWICE DAILY (AM+BEDTIME)     Endocrinology:  Diabetes - Biguanides Failed - 06/29/2023  1:20 PM      Failed - HBA1C is between 0 and 7.9 and within 180 days    HbA1c, POC (controlled diabetic range)  Date Value Ref Range Status  04/20/2023 10.1 (A) 0.0 - 7.0 % Final         Failed - B12 Level in normal range and within 720 days    Vitamin B-12  Date Value Ref Range Status  08/01/2018 301 232 - 1,245 pg/mL Final         Failed - CBC within normal limits and completed in the last 12 months    WBC  Date Value Ref Range Status  04/22/2023 14.9 (H) 4.0 - 10.5 K/uL Final   RBC  Date Value Ref Range Status  04/22/2023 5.72 (H) 3.87 - 5.11 MIL/uL Final   Hemoglobin  Date Value Ref Range Status  04/22/2023 14.8 12.0 - 15.0 g/dL Final  08/65/7846 96.2 11.1 - 15.9 g/dL Final   HCT  Date Value Ref Range Status  04/22/2023 42.4 36.0 - 46.0 % Final   Hematocrit  Date Value Ref Range Status  01/07/2021 46.1 34.0 - 46.6 % Final   MCHC  Date Value Ref Range Status  04/22/2023 34.9 30.0 - 36.0 g/dL Final   [facility]  Date Value Ref Range Status  04/22/2023 25.9 (L) 26.0 - 34.0 pg Final   MCV  Date Value Ref Range Status  04/22/2023 74.1 (L) 80.0 - 100.0 fL Final  01/07/2021 76 (L) 79 - 97 fL Final   No results found for: "PLTCOUNTKUC", "LABPLAT", "POCPLA" RDW  Date Value Ref Range Status  04/22/2023 14.4 11.5 - 15.5 % Final  01/07/2021 14.2 11.7 - 15.4 % Final         Passed - Cr in normal range and within 360 days    Creatinine, Ser  Date Value Ref Range Status  04/22/2023 0.71 0.44 - 1.00 mg/dL Final         Passed - eGFR in normal range and within 360 days    GFR calc Af Amer  Date Value Ref Range Status  12/07/2019 >60 >60 mL/min Final   GFR, Estimated  Date Value Ref Range Status  04/22/2023 >60 >60 mL/min Final     Comment:    (NOTE) Calculated using the CKD-EPI Creatinine Equation (2021)    eGFR  Date Value Ref Range Status  05/29/2022 101 >59 mL/min/1.73 Final         Passed - Valid encounter within last 6 months    Recent Outpatient Visits           2 months ago Type 2 diabetes mellitus without complication, without long-term current use of insulin (HCC)   [place] [facility] Grayce Sessions, NP   6 months ago Primary osteoarthritis of both knees   [place] [facility] Grayce Sessions, NP   9 months ago Type 2 diabetes mellitus without complication, without long-term current use of insulin (HCC)   [place] [facility] Grayce Sessions, NP   10 months ago Type 2 diabetes mellitus without complication, without long-term current use of insulin (HCC)   [place] [facility] [place] [facility] Of [place]. [facility] [place],  Cornelius Moras, RPH-CPP   12 months ago Type 2 diabetes mellitus without complication, with long-term current use of insulin (HCC)   [place] [facility] [place] - [facility] Of [place]. [facility] Drucilla Chalet, RPH-CPP

## 2023-06-29 NOTE — Telephone Encounter (Signed)
 Michelle Please refill if appropriate  Kellen please reach out to pt and schedule an appt 3 months from last visit with [facility]

## 2023-07-10 ENCOUNTER — Other Ambulatory Visit: Payer: Self-pay

## 2023-07-10 NOTE — Patient Outreach (Addendum)
  Medicaid Managed Care Social Work Note  07/10/2023 Name:  [name] MRN:  [id number] DOB:  [birth year]  [name] is an 53 y.o. year old female who is a primary patient of Grayce Sessions, NP.  The Medicaid Managed Care Coordination team was consulted for assistance with:  Community Resources   Ms. Uliano was given information about Medicaid Managed Care Coordination team services today. [name] Patient agreed to services and verbal consent obtained.  Engaged with patient  for by telephone forfollow up visit in response to referral for case management and/or care coordination services.   Patient is participating in a Managed Medicaid Plan:  Yes  Assessments/Interventions:  Review of past medical history, allergies, medications, health status, including review of consultants reports, laboratory and other test data, was performed as part of comprehensive evaluation and provision of chronic care management services.  SDOH: (Social Drivers of Health) assessments and interventions performed: SDOH Interventions    Flowsheet Row Office Visit from 04/20/2023 in [facility]  SDOH Interventions   Food Insecurity Interventions AMB Referral  Housing Interventions Intervention Not Indicated  Transportation Interventions Intervention Not Indicated  Utilities Interventions Intervention Not Indicated     BSW completed a telephone outreach with patient, she states she did not receive the resources BSW sent, but she did go to DSS and they paid $400 towards her utility bill. Patient states she would also like to move, but has not located anything. She is unable to apply for housing because she owes $200. She states [place]. BSW informed patient with her income and she could send her housing resources. BSW emailed resources to [email or web address]. Patient did receive the resources via email. No other resources are  needed at this time. BSW provided patient with contact information for any future needs.  Advanced Directives Status:  Not addressed in this encounter.  Care Plan                 Allergies  Allergen Reactions   Lisinopril     angioedema   Trulicity [Dulaglutide] Other (See Comments)    Gas, abdominal bloating    Medications Reviewed Today   Medications were not reviewed in this encounter     Patient Active Problem List   Diagnosis Date Noted   Osteoarthritis of knees, bilateral 04/17/2022   Tobacco use 02/06/2022   MDD (recurrent major depressive disorder) in remission (HCC) 05/13/2020   Grief reaction with prolonged bereavement 05/13/2020   Hot flashes 10/02/2018   Vaginal dryness 10/02/2018   BIH (benign intracranial hypertension) 11/06/2017   Somnolence, daytime 07/30/2017   Snoring 07/30/2017   Morbid obesity (HCC) 07/30/2017   Migraine 10/02/2016   Common migraine with intractable migraine 10/02/2016   Type 2 diabetes mellitus without complication, without long-term current use of insulin (HCC) 08/17/2016    Conditions to be addressed/monitored per PCP order:   housing  There are no care plans that you recently modified to display for this patient.   Follow up:  Patient agrees to Care Plan and Follow-up.  Plan: The  Patient has been provided with contact information for the Managed Medicaid care management team and has been advised to call with any health related questions or concerns.  Abelino Derrick, MHA [facility] Health  Managed [facility] Social Worker [phone]

## 2023-07-10 NOTE — Patient Instructions (Signed)
 Visit Information  [name] was given information about Medicaid Managed Care team care coordination services as a part of their [employer] Medicaid benefit. [name] verbally consentedto engagement with the [facility] Managed Care team.   If you are experiencing a medical emergency, please call 911 or report to your local emergency department or urgent care.   If you have a non-emergency medical problem during routine business hours, please contact your provider's office and ask to speak with a nurse.   For questions related to your [employer] [facility] health plan, please call: [phone]  OR visit the member homepage at: [email or web address]  If you would like to schedule transportation through your [facility] plan, please call the following number at least 2 days in advance of your appointment: [phone]  If you are experiencing a behavioral health crisis, call the [facility] Crisis Line at [phone]). The line is available 24 hours a day, seven days a week.  If you would like help to quit smoking, call 1-800-QUIT-NOW (701-070-6895) OR Espaol: 1-855-Djelo-Ya (6-644-034-7425) o para ms informacin haga clic aqu or Text READY to 956-387 to register via text   [name] - following are the goals we discussed in your visit today:   Goals Addressed   None      The  Patient                                              has been provided with contact information for the Managed Medicaid care management team and has been advised to call with any health related questions or concerns.   Gus Puma, Kenard Gower, MHA [facility] Health  Managed Medicaid Social Worker [phone]   Following is a copy of your plan of care:  There are no care plans that you recently modified to display for this patient.

## 2023-07-11 ENCOUNTER — Ambulatory Visit: Admitting: Orthopedic Surgery

## 2023-07-16 ENCOUNTER — Telehealth: Payer: Self-pay | Admitting: *Deleted

## 2023-07-16 NOTE — Telephone Encounter (Signed)
 Called and spoke with patient, she states her CPAP machine is from [employer] and is about 55 years old.  She will bring her CPAP machine with her tomorrow to the appointment.  Nothing further needed.  Called and left VM for Brad with [employer] to have [place] tagged in her airview account.

## 2023-07-17 ENCOUNTER — Ambulatory Visit: Payer: Medicaid Other | Admitting: Adult Health

## 2023-07-17 ENCOUNTER — Encounter: Payer: Self-pay | Admitting: Adult Health

## 2023-07-17 VITALS — BP 120/88 | HR 86 | Ht 65.5 in | Wt 233.6 lb

## 2023-07-17 DIAGNOSIS — R400 Somnolence: Secondary | ICD-10-CM

## 2023-07-17 DIAGNOSIS — G4733 Obstructive sleep apnea (adult) (pediatric): Secondary | ICD-10-CM

## 2023-07-17 NOTE — Patient Instructions (Signed)
 Order for new CPAP machine.  Check Overnight oximetry on CPAP Work on healthy weight  Do not drive if sleepy  Use caution with sedating medicaitons Activity as tolerated.  Healthy sleep regimen  Follow up with Dr. Wynona Neat or Vallen Calabrese in 2 months and As needed

## 2023-07-17 NOTE — Progress Notes (Signed)
 @Patient  ID: [name], female    DOB: 11-05-1970, 53 y.o.   MRN: [id number]  Chief Complaint  Patient presents with   Consult    Referring provider: Marius Siemens, NP  HPI: 53 yo female seen for sleep consult to establish for sleep apnea.  TEST/EVENTS :  Split night sleep study 08/2017 AHI 22.5/hr, SpO2 low 87%, optimal control on CPAP 17cmH2o.   07/17/23 Sleep consult  Discussed the use of AI scribe software for clinical note transcription with the patient, who gave verbal consent to proceed.  History of Present Illness   [name] is a 53 year old female with moderate sleep apnea who presents with persistent daytime sleepiness and fatigue despite CPAP use.  Diagnosed with moderate sleep apnea in 2019, Sleep study with AHI 22/hr with SpO2 low at 87%. She uses a CPAP machine  each night and with naps. The machine is approximately 55 years old, and she has not had any follow-up management since it was prescribed. She experiences episodes of breathlessness while using the CPAP, requiring her to remove the mask to catch her breath. Despite nightly use, she experiences frequent headaches upon waking and often feels tired throughout the day. She reports snoring and waking up tired, sometimes napping for about an hour once a week. No use of sleep aids. CPAP download shows excellent control with daily average usage at 9hr , AHI 1.2/hr , Auto CPAP 10-20cmH2O. Min leaks. Goes to bed at 11 pm, up 2 times each night, gets up 9 am. No symptoms suspicious for sleep paralysis or cataplexy. Epworth 16 out of 24.   She experiences significant daytime sleepiness and fatigue despite using CPAP . She is on Stattera daily for ADHD.  No history of congestive heart failure or stroke. Reports swelling in ankles.  She has depression/anxiety, chronic pain and headaches. Currently on Cymbalta and hydroxyzine  She previously on gabapentin and trazodone but has discontinued them.  She has a  history of high blood pressure, allergies, bronchitis, and rheumatoid arthritis. She is unemployed,  lives with her fiancee, does not consume alcohol or drugs, and has quit smoking.      FH: none.   Past Surgical History:  Procedure Laterality Date   CHOLECYSTECTOMY     TUBAL LIGATION        Allergies  Allergen Reactions   Lisinopril     angioedema   Trulicity [Dulaglutide] Other (See Comments)    Gas, abdominal bloating    Immunization History  Administered Date(s) Administered   Moderna Sars-Covid-2 Vaccination 11/25/2019   PPD Test 04/30/2018, 12/07/2020, 05/23/2022   Tdap 01/02/2018, 10/14/2019    Past Medical History:  Diagnosis Date   Anxiety    Common migraine with intractable migraine 10/02/2016   Depression    Diabetes mellitus    Hypertension     Tobacco History: Social History   Tobacco Use  Smoking Status Every Day   Current packs/day: 0.25   Types: Cigarettes  Smokeless Tobacco Never  Tobacco Comments   Smoking 6-7 per day.  Trying to quit.  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   Ready to quit: Not Answered Counseling given: Not Answered Tobacco comments: Smoking 6-7 per day.  Trying to quit.  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   Outpatient Medications Prior to Visit  Medication Sig Dispense Refill   Accu-Chek Softclix Lancets lancets Use to check blood sugar three times daily. 100 each 6   albuterol (VENTOLIN HFA) 108 (90 Base) MCG/ACT inhaler Inhale 1-2 puffs into the  lungs every 6 (six) hours as needed for wheezing or shortness of breath. 8 each 0   amLODipine (NORVASC) 10 MG tablet TAKE 1 TABLET BY MOUTH DAILY. (AM) 30 tablet 0   aspirin EC 81 MG tablet Take 81 mg by mouth every 6 (six) hours as needed for mild pain.      atomoxetine (STRATTERA) 40 MG capsule Take 1 capsule (40 mg total) by mouth daily. 30 capsule 3   blood glucose meter kit and supplies Dispense based on patient and insurance preference. Use up to four times daily as directed. (FOR ICD-10 E10.9, E11.9). 1 each 0    Blood Glucose Monitoring Suppl (ACCU-CHEK GUIDE) w/Device KIT Use to check blood sugar three times daily. 1 kit 0   celecoxib (CELEBREX) 200 MG capsule TAKE ONE CAPSULE BY MOUTH TWICE DAILY (AM+BEDTIME) 60 capsule 0   Continuous Blood Gluc Receiver (FREESTYLE LIBRE 2 READER) DEVI Use to check blood sugar continuously throughout the day. Change sensors once every 14 days. E11.9 1 each 0   Continuous Blood Gluc Sensor (FREESTYLE LIBRE 2 SENSOR) MISC Use to check blood sugar continuously throughout the day. Change sensors once every 14 days. E11.9 2 each 3   dicyclomine (BENTYL) 20 MG tablet Take 1 tablet (20 mg total) by mouth 2 (two) times daily. 20 tablet 0   DULoxetine (CYMBALTA) 60 MG capsule TAKE 1 CAPSULE (60 MG TOTAL) BY MOUTH 2 (TWO) TIMES DAILY (AM+BEDTIME) 90 capsule 3   EASY COMFORT PEN NEEDLES 31G X 8 MM MISC USE TO INJECT LEVEMIR TWICE A DAY 100 each 2   fluticasone (FLONASE) 50 MCG/ACT nasal spray PLACE 2 SPRAYS INTO BOTH NOSTRILS DAILY. 16 g 2   fluticasone (FLOVENT HFA) 44 MCG/ACT inhaler Inhale 2 puffs into the lungs 2 (two) times daily. 1 Inhaler 12   gabapentin (NEURONTIN) 300 MG capsule TAKE ONE CAPSULE BY MOUTH THREE TIMES DAILY ( AM+NOON+BEDTIME) 90 capsule 0   glucose blood (ACCU-CHEK GUIDE) test strip Use to check blood sugar three times daily. 100 each 6   HYDROcodone-acetaminophen (NORCO) 5-325 MG tablet Take 1 tablet by mouth every 4 (four) hours as needed for moderate pain. 10 tablet 0   hydrOXYzine (ATARAX) 10 MG tablet Take 1 tablet (10 mg total) by mouth 3 (three) times daily as needed. 90 tablet 3   insulin glargine (LANTUS SOLOSTAR) 100 UNIT/ML Solostar Pen Inject 56 Units into the skin daily. 45 mL 1   lidocaine (XYLOCAINE) 2 % jelly Place 1 Application into the urethra as needed. 30 mL 0   losartan (COZAAR) 50 MG tablet TAKE 1 TABLET (50 MG TOTAL) BY MOUTH DAILY (AM) 30 tablet 0   metFORMIN (GLUCOPHAGE) 1000 MG tablet TAKE ONE TABLET BY MOUTH TWICE DAILY  (AM+BEDTIME) 180 tablet 0   nicotine (NICODERM CQ - DOSED IN MG/24 HR) 7 mg/24hr patch Place 1 patch (7 mg total) onto the skin daily. Remove before bedtime. 28 patch 5   nicotine polacrilex (NICORETTE) 2 MG gum Take 1 each (2 mg total) by mouth as needed for smoking cessation. Use every 1-2 hours as needed. 100 tablet 5   ondansetron (ZOFRAN) 4 MG tablet Take 1 tablet (4 mg total) by mouth every 8 (eight) hours as needed for nausea or vomiting. 20 tablet 0   OZEMPIC, 2 MG/DOSE, 8 MG/3ML SOPN INJECT 2 MG AS DIRECTED ONCE A WEEK. 9 mL 2   pantoprazole (PROTONIX) 40 MG tablet TAKE 1 TABLET (40 MG TOTAL) BY MOUTH DAILY (AM) 30 tablet  3   pravastatin (PRAVACHOL) 40 MG tablet TAKE 1 TABLET (40 MG TOTAL) BY MOUTH DAILY (BEDTIME) 30 tablet 0   SUMAtriptan (IMITREX) 50 MG tablet Take 1 tablet (50 mg total) by mouth every 2 (two) hours as needed for migraine. May repeat in 2 hours if headache persists or recurs. 10 tablet 0   traZODone (DESYREL) 100 MG tablet Take 1 tablet (100 mg total) by mouth at bedtime. 30 tablet 3   albuterol (VENTOLIN HFA) 108 (90 Base) MCG/ACT inhaler Inhale 1-2 puffs into the lungs every 6 (six) hours as needed for up to 14 days for wheezing or shortness of breath. 1 each 0   fluconazole (DIFLUCAN) 150 MG tablet Take 1 tablet (150 mg total) by mouth every 3 (three) days. (Patient not taking: Reported on 05/12/2022) 2 tablet 0   ondansetron (ZOFRAN-ODT) 4 MG disintegrating tablet Take 1 tablet (4 mg total) by mouth every 8 (eight) hours as needed. (Patient not taking: Reported on 07/17/2023) 20 tablet 0   pantoprazole (PROTONIX) 40 MG tablet Take 1 tablet (40 mg total) by mouth daily. (Patient not taking: Reported on 07/17/2023) 30 tablet 0   No facility-administered medications prior to visit.     Review of Systems:   Constitutional:   No  weight loss, night sweats,  Fevers, chills, +fatigue, or  lassitude.  HEENT:   No headaches,  Difficulty swallowing,  Tooth/dental problems, or   Sore throat,                No sneezing, itching, ear ache, nasal congestion, post nasal drip,   CV:  No chest pain,  Orthopnea, PND, swelling in lower extremities, anasarca, dizziness, palpitations, syncope.   GI  No heartburn, indigestion, abdominal pain, nausea, vomiting, diarrhea, change in bowel habits, loss of appetite, bloody stools.   Resp: No shortness of breath with exertion or at rest.  No excess mucus, no productive cough,  No non-productive cough,  No coughing up of blood.  No change in color of mucus.  No wheezing.  No chest wall deformity  Skin: no rash or lesions.  GU: no dysuria, change in color of urine, no urgency or frequency.  No flank pain, no hematuria   MS:  No joint pain or swelling.  No decreased range of motion.  No back pain.    Physical Exam  BP 120/88 (BP Location: Right Arm, Patient Position: Sitting, Cuff Size: Large)   Pulse 86   Ht 5' 5.5" (1.664 m)   Wt 233 lb 9.6 oz (106 kg)   SpO2 97%   BMI 38.28 kg/m   GEN: A/Ox3; pleasant , NAD, well nourished    HEENT:  [place]/AT,  NOSE-clear, THROAT-clear, no lesions, no postnasal drip or exudate noted.   NECK:  Supple w/ fair ROM; no JVD; normal carotid impulses w/o bruits; no thyromegaly or nodules palpated; no lymphadenopathy.    RESP  Clear  P & A; w/o, wheezes/ rales/ or rhonchi. no accessory muscle use, no dullness to percussion  CARD:  RRR, no m/r/g, no peripheral edema, pulses intact, no cyanosis or clubbing.  GI:   Soft & nt; nml bowel sounds; no organomegaly or masses detected.   Musco: Warm bil, no deformities or joint swelling noted.   Neuro: alert, no focal deficits noted.    Skin: Warm, no lesions or rashes    Lab Results:  CBC     BNP No results found for: "BNP"  ProBNP No results found for: "PROBNP"  Imaging: No results found.  Administration History     None           No data to display          No results found for: "NITRICOXIDE"      Assessment &  Plan:   Assessment and Plan    Obstructive Sleep Apnea   She has moderate obstructive sleep apnea with AHI 22/hr , and oxygen desaturation to 87%. She has excellent usage and control on CPAP .  Episodes of breathlessness during CPAP ? Etiology. Residual hypersomnia is suspected despite well-controlled sleep apnea. A new CPAP machine will be ordered  Order a new CPAP machine and supplies. Conduct an overnight oximetry test while using CPAP to assess oxygen desaturation. Evaluate oximetry results to determine the need for further testing.  Daytime Sleepiness   Persistent daytime sleepiness continues despite CPAP use and Strattera. Residual hypersomnia related to sleep apnea is considered. Low activity levels may contribute to symptoms. Narcolepsy and medication side effects are possibilities. Medication side effects  Evaluate oximetry results to determine the need for further testing.  Hypertension   She is on Norvasc for hypertension. Continue follow up with PCP   Depression   She is on Cymbalta for depression, which may contribute to fatigue and sleepiness. Previously on trazodone, now discontinued. Hydroxyzine is used for anxiety, with potential sedative effects.   Follow-up   Monitor for symptom changes and response to the new CPAP machine and oximetry results. Follow up after receiving the new CPAP machine and oximetry results to determine the need for further testing or adjustments.         Roena Clark, NP 07/17/2023

## 2023-07-21 ENCOUNTER — Emergency Department (HOSPITAL_COMMUNITY)

## 2023-07-21 ENCOUNTER — Other Ambulatory Visit: Payer: Self-pay

## 2023-07-21 ENCOUNTER — Emergency Department (HOSPITAL_COMMUNITY)
Admission: EM | Admit: 2023-07-21 | Discharge: 2023-07-21 | Disposition: A | Discharge status: Home / Self Care | Attending: Emergency Medicine | Admitting: Emergency Medicine

## 2023-07-21 DIAGNOSIS — Z7982 Long term (current) use of aspirin: Secondary | ICD-10-CM | POA: Diagnosis not present

## 2023-07-21 DIAGNOSIS — R202 Paresthesia of skin: Secondary | ICD-10-CM | POA: Insufficient documentation

## 2023-07-21 DIAGNOSIS — M5412 Radiculopathy, cervical region: Secondary | ICD-10-CM | POA: Diagnosis not present

## 2023-07-21 DIAGNOSIS — M25511 Pain in right shoulder: Secondary | ICD-10-CM | POA: Diagnosis not present

## 2023-07-21 DIAGNOSIS — E119 Type 2 diabetes mellitus without complications: Secondary | ICD-10-CM | POA: Insufficient documentation

## 2023-07-21 DIAGNOSIS — Z7984 Long term (current) use of oral hypoglycemic drugs: Secondary | ICD-10-CM | POA: Diagnosis not present

## 2023-07-21 DIAGNOSIS — M542 Cervicalgia: Secondary | ICD-10-CM | POA: Diagnosis not present

## 2023-07-21 DIAGNOSIS — M79609 Pain in unspecified limb: Secondary | ICD-10-CM

## 2023-07-21 DIAGNOSIS — M6520 Calcific tendinitis, unspecified site: Secondary | ICD-10-CM

## 2023-07-21 DIAGNOSIS — Z794 Long term (current) use of insulin: Secondary | ICD-10-CM | POA: Insufficient documentation

## 2023-07-21 DIAGNOSIS — Z720 Tobacco use: Secondary | ICD-10-CM | POA: Diagnosis not present

## 2023-07-21 DIAGNOSIS — M62838 Other muscle spasm: Secondary | ICD-10-CM | POA: Insufficient documentation

## 2023-07-21 DIAGNOSIS — M19011 Primary osteoarthritis, right shoulder: Secondary | ICD-10-CM | POA: Diagnosis not present

## 2023-07-21 LAB — CBC WITH DIFFERENTIAL/PLATELET
Abs Immature Granulocytes: 0.06 10*3/uL (ref 0.00–0.07)
Basophils Absolute: 0 10*3/uL (ref 0.0–0.1)
Basophils Relative: 0 %
Eosinophils Absolute: 0.1 10*3/uL (ref 0.0–0.5)
Eosinophils Relative: 1 %
HCT: 42.6 % (ref 36.0–46.0)
Hemoglobin: 14.4 g/dL (ref 12.0–15.0)
Immature Granulocytes: 1 %
Lymphocytes Relative: 35 %
Lymphs Abs: 4.7 10*3/uL — ABNORMAL HIGH (ref 0.7–4.0)
MCH: 25 pg — ABNORMAL LOW (ref 26.0–34.0)
MCHC: 33.8 g/dL (ref 30.0–36.0)
MCV: 74 fL — ABNORMAL LOW (ref 80.0–100.0)
Monocytes Absolute: 0.6 10*3/uL (ref 0.1–1.0)
Monocytes Relative: 4 %
Neutro Abs: 7.8 10*3/uL — ABNORMAL HIGH (ref 1.7–7.7)
Neutrophils Relative %: 59 %
Platelets: 298 10*3/uL (ref 150–400)
RBC: 5.76 MIL/uL — ABNORMAL HIGH (ref 3.87–5.11)
RDW: 14.5 % (ref 11.5–15.5)
WBC: 13.3 10*3/uL — ABNORMAL HIGH (ref 4.0–10.5)
nRBC: 0 % (ref 0.0–0.2)

## 2023-07-21 LAB — BASIC METABOLIC PANEL WITH GFR
Anion gap: 10 (ref 5–15)
BUN: 11 mg/dL (ref 6–20)
CO2: 26 mmol/L (ref 22–32)
Calcium: 9.1 mg/dL (ref 8.9–10.3)
Chloride: 105 mmol/L (ref 98–111)
Creatinine, Ser: 0.76 mg/dL (ref 0.44–1.00)
GFR, Estimated: >60 mL/min (ref >60)
Glucose, Bld: 212 mg/dL — ABNORMAL HIGH (ref 70–99)
Potassium: 3.7 mmol/L (ref 3.5–5.1)
Sodium: 141 mmol/L (ref 135–145)

## 2023-07-21 LAB — MAGNESIUM: Magnesium: 1.7 mg/dL (ref 1.7–2.4)

## 2023-07-21 LAB — VITAMIN B12: Vitamin B-12: 228 pg/mL (ref 180–914)

## 2023-07-21 MED ORDER — METHOCARBAMOL 500 MG PO TABS: 500.0000 mg | ORAL_TABLET | Freq: Two times a day (BID) | ORAL | 0 refills | Status: DC

## 2023-07-21 MED ORDER — OXYCODONE-ACETAMINOPHEN 5-325 MG PO TABS
1.0000 | ORAL_TABLET | Freq: Once | ORAL | Status: AC
  Administered 2023-07-21: 1 via ORAL
  Filled 2023-07-21: qty 1

## 2023-07-21 MED ORDER — NAPROXEN 500 MG PO TABS: 500.0000 mg | ORAL_TABLET | Freq: Two times a day (BID) | ORAL | 0 refills | Status: DC

## 2023-07-21 MED ORDER — LIDOCAINE 5 % EX PTCH
2.0000 | MEDICATED_PATCH | CUTANEOUS | Status: DC
  Administered 2023-07-21: 2 via TRANSDERMAL
  Filled 2023-07-21: qty 2

## 2023-07-21 MED ORDER — KETOROLAC TROMETHAMINE 15 MG/ML IJ SOLN
15.0000 mg | Freq: Once | INTRAMUSCULAR | Status: AC
  Administered 2023-07-21: 15 mg via INTRAVENOUS
  Filled 2023-07-21: qty 1

## 2023-07-21 NOTE — ED Provider Notes (Signed)
 [place] EMERGENCY DEPARTMENT AT [facility] Provider Note   CSN: [id number] Arrival date & time: 07/21/23  1733     History {Add pertinent medical, surgical, social history, OB history to HPI:1} Chief Complaint  Patient presents with  . Shoulder Pain    [name] is a 53 y.o. female with past medical history of non-insulin-dependent T2DM, migraine, BMI 36, BIH, tobacco use presents emergency department for evaluation of right neck and shoulder pain that radiates into her fingers.  She endorses that the pain is sharp in the right neck and shoulder.  She has a tingling sensation that goes down the entire right arm into her fingers.  No numbness or weakness at home.  No history of spine disease, trauma, injury.   Shoulder Pain Associated symptoms: no fatigue and no fever        Home Medications Prior to Admission medications   Medication Sig Start Date End Date Taking? Authorizing Provider  Accu-Chek Softclix Lancets lancets Use to check blood sugar three times daily. 08/04/22   Newlin, Enobong, MD  albuterol (VENTOLIN HFA) 108 (90 Base) MCG/ACT inhaler Inhale 1-2 puffs into the lungs every 6 (six) hours as needed for wheezing or shortness of breath. 11/05/20   Sharla Davis, PA-C  albuterol (VENTOLIN HFA) 108 (90 Base) MCG/ACT inhaler Inhale 1-2 puffs into the lungs every 6 (six) hours as needed for up to 14 days for wheezing or shortness of breath. 06/07/21 06/21/21  Leath-Warren, Belen Bowers, NP  amLODipine (NORVASC) 10 MG tablet TAKE 1 TABLET BY MOUTH DAILY. (AM) 06/29/23   Marius Siemens, NP  aspirin EC 81 MG tablet Take 81 mg by mouth every 6 (six) hours as needed for mild pain.     [provider]  atomoxetine (STRATTERA) 40 MG capsule Take 1 capsule (40 mg total) by mouth daily. 05/25/23   Arlyne Bering, NP  blood glucose meter kit and supplies Dispense based on patient and insurance preference. Use up to four times daily as directed. (FOR  ICD-10 E10.9, E11.9). 01/05/20   Marius Siemens, NP  Blood Glucose Monitoring Suppl (ACCU-CHEK GUIDE) w/Device KIT Use to check blood sugar three times daily. 08/04/22   Newlin, Enobong, MD  celecoxib (CELEBREX) 200 MG capsule TAKE ONE CAPSULE BY MOUTH TWICE DAILY (AM+BEDTIME) 06/29/23   Marius Siemens, NP  Continuous Blood Gluc Receiver (FREESTYLE LIBRE 2 READER) DEVI Use to check blood sugar continuously throughout the day. Change sensors once every 14 days. E11.9 06/30/22   Joaquin Mulberry, MD  Continuous Blood Gluc Sensor (FREESTYLE LIBRE 2 SENSOR) MISC Use to check blood sugar continuously throughout the day. Change sensors once every 14 days. E11.9 06/30/22   Newlin, Enobong, MD  dicyclomine (BENTYL) 20 MG tablet Take 1 tablet (20 mg total) by mouth 2 (two) times daily. 03/14/23   Lucina Sabal, PA-C  DULoxetine (CYMBALTA) 60 MG capsule TAKE 1 CAPSULE (60 MG TOTAL) BY MOUTH 2 (TWO) TIMES DAILY (AM+BEDTIME) 05/25/23   Arlyne Bering, NP  EASY COMFORT PEN NEEDLES 31G X 8 MM MISC USE TO INJECT LEVEMIR TWICE A DAY 10/26/21   Newlin, Enobong, MD  fluticasone (FLONASE) 50 MCG/ACT nasal spray PLACE 2 SPRAYS INTO BOTH NOSTRILS DAILY. 10/17/22   Marius Siemens, NP  fluticasone (FLOVENT HFA) 44 MCG/ACT inhaler Inhale 2 puffs into the lungs 2 (two) times daily. 01/19/19   Marius Siemens, NP  gabapentin (NEURONTIN) 300 MG capsule TAKE ONE CAPSULE BY MOUTH THREE TIMES DAILY (  AM+NOON+BEDTIME) 01/23/23   Marius Siemens, NP  glucose blood (ACCU-CHEK GUIDE) test strip Use to check blood sugar three times daily. 08/04/22   Newlin, Enobong, MD  HYDROcodone-acetaminophen (NORCO) 5-325 MG tablet Take 1 tablet by mouth every 4 (four) hours as needed for moderate pain. 07/06/21   Alissa April, MD  hydrOXYzine (ATARAX) 10 MG tablet Take 1 tablet (10 mg total) by mouth 3 (three) times daily as needed. 05/25/23   Parsons, Brittney E, NP  insulin glargine (LANTUS SOLOSTAR) 100 UNIT/ML Solostar Pen Inject 56  Units into the skin daily. 09/28/22   Marius Siemens, NP  lidocaine (XYLOCAINE) 2 % jelly Place 1 Application into the urethra as needed. 12/19/21   Corine Dice, MD  losartan (COZAAR) 50 MG tablet TAKE 1 TABLET (50 MG TOTAL) BY MOUTH DAILY (AM) 06/29/23   Marius Siemens, NP  metFORMIN (GLUCOPHAGE) 1000 MG tablet TAKE ONE TABLET BY MOUTH TWICE DAILY (AM+BEDTIME) 06/29/23   Marius Siemens, NP  nicotine (NICODERM CQ - DOSED IN MG/24 HR) 7 mg/24hr patch Place 1 patch (7 mg total) onto the skin daily. Remove before bedtime. 05/25/23   Parsons, Brittney E, NP  nicotine polacrilex (NICORETTE) 2 MG gum Take 1 each (2 mg total) by mouth as needed for smoking cessation. Use every 1-2 hours as needed. 05/25/23   Parsons, Brittney E, NP  ondansetron (ZOFRAN) 4 MG tablet Take 1 tablet (4 mg total) by mouth every 8 (eight) hours as needed for nausea or vomiting. 02/15/21   Newlin, Enobong, MD  OZEMPIC, 2 MG/DOSE, 8 MG/3ML SOPN INJECT 2 MG AS DIRECTED ONCE A WEEK. 06/06/23   Newlin, Enobong, MD  pantoprazole (PROTONIX) 40 MG tablet TAKE 1 TABLET (40 MG TOTAL) BY MOUTH DAILY (AM) 02/16/23   Marius Siemens, NP  pravastatin (PRAVACHOL) 40 MG tablet TAKE 1 TABLET (40 MG TOTAL) BY MOUTH DAILY (BEDTIME) 06/29/23   Marius Siemens, NP  SUMAtriptan (IMITREX) 50 MG tablet Take 1 tablet (50 mg total) by mouth every 2 (two) hours as needed for migraine. May repeat in 2 hours if headache persists or recurs. 06/11/23   Marius Siemens, NP  traZODone (DESYREL) 100 MG tablet Take 1 tablet (100 mg total) by mouth at bedtime. 05/25/23   Arlyne Bering, NP      Allergies    Lisinopril and Trulicity [dulaglutide]    Review of Systems   Review of Systems  Constitutional:  Negative for chills, fatigue and fever.  Respiratory:  Negative for cough, chest tightness, shortness of breath and wheezing.   Cardiovascular:  Negative for chest pain and palpitations.  Gastrointestinal:  Negative for abdominal pain,  constipation, diarrhea, nausea and vomiting.  Neurological:  Negative for dizziness, seizures, weakness, light-headedness, numbness and headaches.    Physical Exam Updated Vital Signs BP (!) 159/118   Pulse 78   Temp 98.5 F (36.9 C) (Oral)   Resp 16   Ht 5' 5.5" (1.664 m)   Wt 102.1 kg   SpO2 99%   BMI 36.87 kg/m  Physical Exam Vitals and nursing note reviewed.  Constitutional:      General: She is not in acute distress.    Appearance: Normal appearance.  HENT:     Head: Normocephalic and atraumatic.  Eyes:     Conjunctiva/sclera: Conjunctivae normal.  Neck:   Cardiovascular:     Rate and Rhythm: Normal rate.  Pulmonary:     Effort: Pulmonary effort is normal. No respiratory distress.  Skin:    Coloration: Skin is not jaundiced or pale.  Neurological:     Mental Status: She is alert. Mental status is at baseline.    ED Results / Procedures / Treatments   Labs (all labs ordered are listed, but only abnormal results are displayed) Labs Reviewed - No data to display  EKG None  Radiology No results found.  Procedures Procedures  {Document cardiac monitor, telemetry assessment procedure when appropriate:1}  Medications Ordered in ED Medications - No data to display  ED Course/ Medical Decision Making/ A&P Clinical Course as of 07/21/23 2000  Sat Jul 21, 2023  1940 WBC(!): 13.3 Chronically elevated over past 2 years.  Baseline 12.4-15.6 [LB]    Clinical Course User Index [LB] Royann Cords, PA   {   Click here for ABCD2, HEART and other calculatorsREFRESH Note before signing :1}                              Medical Decision Making Amount and/or Complexity of Data Reviewed Labs: ordered. Decision-making details documented in ED Course. Radiology: ordered.  Risk Prescription drug management.   ***  {Document critical care time when appropriate:1} {Document review of labs and clinical decision tools ie heart score, Chads2Vasc2 etc:1}   {Document your independent review of radiology images, and any outside records:1} {Document your discussion with family members, caretakers, and with consultants:1} {Document social determinants of health affecting pt's care:1} {Document your decision making why or why not admission, treatments were needed:1} Final Clinical Impression(s) / ED Diagnoses Final diagnoses:  None    Rx / DC Orders ED Discharge Orders     None

## 2023-07-21 NOTE — Discharge Instructions (Addendum)
 Thank you for let us  evaluate you today.  Your neck CT did not show any degenerative disc disease.  Your x-ray of your right shoulder showed some calcified tendinitis which is likely the source of your pain.  Treatment for this is typically rest, anti-inflammatory meds, PT, and/or corticosteroid injections.  I provided you prescription of naproxen and Robaxin for anti-inflammatory and muscle spasms to use as needed.  You may use naproxen and Tylenol intermittently every 8 hours as needed for pain.  Please do not use naproxen with celebrex, aspirin, Aleve, ibuprofen, Advil as they are all in the same family. Robaxin may cause drowsiness so do not operate heavy machinery including driving or drink alcohol with this.  You may take this at night or split the tablet in half if it makes you too drowsy.  I provided you with orthopedic follow-up for further management of this  Return to emergency department if you experience weakness or numbness in your hands or fingers. Most importantly numbness

## 2023-07-21 NOTE — ED Triage Notes (Signed)
 Patient to ED by POV with c/o right shoulder pain. Per patient pain started 2 days ago, she denies injury to cause pain, she also took OTC meds with no relief. She reports pain radiates into her neck and into her arm and fingers causing tingling. She has HX of HTN and diabetes.

## 2023-07-23 ENCOUNTER — Telehealth: Payer: Self-pay | Admitting: Adult Health

## 2023-07-23 NOTE — Telephone Encounter (Signed)
 Per Rodman Clam at [employer]-  Order has been received and sent to review. However, I do not see a face 2 face note that showes usage and benefit of the pap unit or details on the pap visit. It looked like the note was not complete when first recieved. However, It looks to be completed now and does not mention the pap unit other than the new order. Please advise.

## 2023-07-25 NOTE — Telephone Encounter (Signed)
 Note from 4/8 is updated.

## 2023-07-25 NOTE — Telephone Encounter (Signed)
 Perfect. I have let the DME company know. NFN at this time. Closing encounter

## 2023-07-27 ENCOUNTER — Other Ambulatory Visit: Payer: Self-pay

## 2023-08-02 ENCOUNTER — Other Ambulatory Visit: Payer: Self-pay

## 2023-08-08 ENCOUNTER — Institutional Professional Consult (permissible substitution): Payer: Medicaid Other | Admitting: Diagnostic Neuroimaging

## 2023-08-08 ENCOUNTER — Encounter: Payer: Self-pay | Admitting: Diagnostic Neuroimaging

## 2023-08-09 ENCOUNTER — Other Ambulatory Visit (INDEPENDENT_AMBULATORY_CARE_PROVIDER_SITE_OTHER): Payer: Self-pay | Admitting: Primary Care

## 2023-08-13 ENCOUNTER — Telehealth: Payer: Self-pay

## 2023-08-13 NOTE — Telephone Encounter (Signed)
 Copied from CRM [id number]. Topic: Clinical - Medication Refill >> Aug 09, 2023  3:53 PM Jeris Montes S wrote: Most Recent Primary Care Visit:  Provider: Marius Siemens  Department: [facility]  Visit Type: OFFICE VISIT  Date: 04/20/2023  Medication: OZEMPIC , 2 MG/DOSE, 8 MG/3ML SOPN  Has the patient contacted their pharmacy? Yes (Agent: If no, request that the patient contact the pharmacy for the refill. If patient does not wish to contact the pharmacy document the reason why and proceed with request.) (Agent: If yes, when and what did the pharmacy advise?)  Is this the correct pharmacy for this prescription? Yes If no, delete pharmacy and type the correct one.  This is the patient's preferred pharmacy:  [facility] - [place], [place] - [address] [place] [address] Phone: [phone] Fax: [phone]  Has the prescription been filled recently? No  Is the patient out of the medication? Yes  Has the patient been seen for an appointment in the last year OR does the patient have an upcoming appointment? Yes  Can we respond through MyChart? Yes  Agent: Please be advised that Rx refills may take up to 3 business days. We ask that you follow-up with your pharmacy. >> Aug 13, 2023  3:13 PM Emylou G wrote: Patient called.. checking status on refill - pharmacy waiting on us .[name]

## 2023-08-14 ENCOUNTER — Telehealth: Payer: Self-pay

## 2023-08-14 ENCOUNTER — Other Ambulatory Visit (INDEPENDENT_AMBULATORY_CARE_PROVIDER_SITE_OTHER): Payer: Self-pay | Admitting: Primary Care

## 2023-08-14 ENCOUNTER — Other Ambulatory Visit: Payer: Self-pay

## 2023-08-14 ENCOUNTER — Encounter (INDEPENDENT_AMBULATORY_CARE_PROVIDER_SITE_OTHER): Payer: Self-pay | Admitting: Primary Care

## 2023-08-14 ENCOUNTER — Ambulatory Visit (INDEPENDENT_AMBULATORY_CARE_PROVIDER_SITE_OTHER): Admitting: Primary Care

## 2023-08-14 VITALS — BP 126/84 | HR 85 | Resp 16 | Wt 237.8 lb

## 2023-08-14 DIAGNOSIS — N6310 Unspecified lump in the right breast, unspecified quadrant: Secondary | ICD-10-CM

## 2023-08-14 DIAGNOSIS — Z1211 Encounter for screening for malignant neoplasm of colon: Secondary | ICD-10-CM

## 2023-08-14 DIAGNOSIS — E119 Type 2 diabetes mellitus without complications: Secondary | ICD-10-CM | POA: Diagnosis not present

## 2023-08-14 DIAGNOSIS — I10 Essential (primary) hypertension: Secondary | ICD-10-CM

## 2023-08-14 DIAGNOSIS — Z1231 Encounter for screening mammogram for malignant neoplasm of breast: Secondary | ICD-10-CM

## 2023-08-14 DIAGNOSIS — G4733 Obstructive sleep apnea (adult) (pediatric): Secondary | ICD-10-CM | POA: Diagnosis not present

## 2023-08-14 LAB — POCT GLYCOSYLATED HEMOGLOBIN (HGB A1C): HbA1c, POC (controlled diabetic range): 9.9 % — AB (ref 0.0–7.0)

## 2023-08-14 MED ORDER — TIRZEPATIDE 2.5 MG/0.5ML ~~LOC~~ SOAJ: 2.5000 mg | SUBCUTANEOUS | 1 refills | Status: DC

## 2023-08-14 MED ORDER — LANTUS SOLOSTAR 100 UNIT/ML ~~LOC~~ SOPN
56.0000 [IU] | PEN_INJECTOR | Freq: Every day | SUBCUTANEOUS | 1 refills | Status: DC
Start: 2023-08-14 — End: 2024-01-28

## 2023-08-14 MED ORDER — IBUPROFEN 600 MG PO TABS: 600.0000 mg | ORAL_TABLET | Freq: Three times a day (TID) | ORAL | 0 refills | Status: DC | PRN

## 2023-08-14 MED ORDER — LOSARTAN POTASSIUM 50 MG PO TABS: ORAL_TABLET | ORAL | 1 refills | Status: DC

## 2023-08-14 NOTE — Telephone Encounter (Signed)
Pt is in the office today.  

## 2023-08-14 NOTE — Progress Notes (Signed)
 Subjective:  Patient ID: [name], female    DOB: 03-Jan-1971  Age: 53 y.o. MRN: [id number]  CC: Diabetes (A1C-9.9), New Med Request (Pt is requesting ibuprofen  because her legs are stating to hurt ), and Hypertension   [name] presents for follow-up of diabetes. Patient does not check blood sugar at home. Cornelius Dill has accompanied her and patient has given permission for him to be at this appointment and provide.  She has changed her diet decreased soda intake sweets and sugar including junk food. Compliant with meds - Yes Checking CBGs? No  Fasting avg -   Postprandial average -  Exercising regularly? - Yes Watching carbohydrate intake? - Yes Neuropathy ? - Yes Hypoglycemic events - No  - Recovers with :   Pertinent ROS:  Polyuria - No Polydipsia - No Vision problems - No  Medications as noted below. Taking them regularly without complication/adverse reaction being reported today.   History [name] has a past medical history of Anxiety, Common migraine with intractable migraine (10/02/2016), Depression, Diabetes mellitus, and Hypertension.   She has a past surgical history that includes Cholecystectomy and Tubal ligation.   Her family history includes Cancer in her father; Diabetes in her brother, brother, brother, brother, brother, mother, sister, and sister; Healthy in her daughter and daughter; Hypertension in her mother; Renal Disease in her brother and mother.She reports that she has been smoking cigarettes. She has never used smokeless tobacco. She reports that she does not drink alcohol and does not use drugs.  Current Outpatient Medications on File Prior to Visit  Medication Sig Dispense Refill   Accu-Chek Softclix Lancets lancets Use to check blood sugar three times daily. 100 each 6   albuterol  (VENTOLIN  HFA) 108 (90 Base) MCG/ACT inhaler Inhale 1-2 puffs into the lungs every 6 (six) hours as needed for wheezing or shortness of breath. 8 each 0   albuterol   (VENTOLIN  HFA) 108 (90 Base) MCG/ACT inhaler Inhale 1-2 puffs into the lungs every 6 (six) hours as needed for up to 14 days for wheezing or shortness of breath. 1 each 0   amLODipine  (NORVASC ) 10 MG tablet TAKE 1 TABLET BY MOUTH DAILY. (AM) 30 tablet 0   aspirin  EC 81 MG tablet Take 81 mg by mouth every 6 (six) hours as needed for mild pain.      atomoxetine  (STRATTERA ) 40 MG capsule Take 1 capsule (40 mg total) by mouth daily. 30 capsule 3   blood glucose meter kit and supplies Dispense based on patient and insurance preference. Use up to four times daily as directed. (FOR ICD-10 E10.9, E11.9). 1 each 0   Blood Glucose Monitoring Suppl (ACCU-CHEK GUIDE) w/Device KIT Use to check blood sugar three times daily. 1 kit 0   celecoxib  (CELEBREX ) 200 MG capsule TAKE ONE CAPSULE BY MOUTH TWICE DAILY (AM+BEDTIME) 60 capsule 0   Continuous Blood Gluc Receiver (FREESTYLE LIBRE 2 READER) DEVI Use to check blood sugar continuously throughout the day. Change sensors once every 14 days. E11.9 1 each 0   Continuous Blood Gluc Sensor (FREESTYLE LIBRE 2 SENSOR) MISC Use to check blood sugar continuously throughout the day. Change sensors once every 14 days. E11.9 2 each 3   dicyclomine  (BENTYL ) 20 MG tablet Take 1 tablet (20 mg total) by mouth 2 (two) times daily. 20 tablet 0   DULoxetine  (CYMBALTA ) 60 MG capsule TAKE 1 CAPSULE (60 MG TOTAL) BY MOUTH 2 (TWO) TIMES DAILY (AM+BEDTIME) 90 capsule 3   EASY COMFORT PEN NEEDLES 31G  X 8 MM MISC USE TO INJECT LEVEMIR  TWICE A DAY 100 each 2   fluticasone  (FLONASE ) 50 MCG/ACT nasal spray PLACE 2 SPRAYS INTO BOTH NOSTRILS DAILY. 16 g 2   fluticasone  (FLOVENT  HFA) 44 MCG/ACT inhaler Inhale 2 puffs into the lungs 2 (two) times daily. 1 Inhaler 12   gabapentin  (NEURONTIN ) 300 MG capsule TAKE ONE CAPSULE BY MOUTH THREE TIMES DAILY ( AM+NOON+BEDTIME) 90 capsule 0   glucose blood (ACCU-CHEK GUIDE) test strip Use to check blood sugar three times daily. 100 each 6    HYDROcodone -acetaminophen  (NORCO) 5-325 MG tablet Take 1 tablet by mouth every 4 (four) hours as needed for moderate pain. 10 tablet 0   hydrOXYzine  (ATARAX ) 10 MG tablet Take 1 tablet (10 mg total) by mouth 3 (three) times daily as needed. 90 tablet 3   insulin  glargine (LANTUS  SOLOSTAR) 100 UNIT/ML Solostar Pen Inject 56 Units into the skin daily. 45 mL 1   lidocaine  (XYLOCAINE ) 2 % jelly Place 1 Application into the urethra as needed. 30 mL 0   losartan  (COZAAR ) 50 MG tablet TAKE 1 TABLET (50 MG TOTAL) BY MOUTH DAILY (AM) 30 tablet 0   metFORMIN  (GLUCOPHAGE ) 1000 MG tablet TAKE ONE TABLET BY MOUTH TWICE DAILY (AM+BEDTIME) 180 tablet 0   methocarbamol  (ROBAXIN ) 500 MG tablet Take 1 tablet (500 mg total) by mouth 2 (two) times daily. 20 tablet 0   naproxen  (NAPROSYN ) 500 MG tablet Take 1 tablet (500 mg total) by mouth 2 (two) times daily. 30 tablet 0   nicotine  (NICODERM CQ  - DOSED IN MG/24 HR) 7 mg/24hr patch Place 1 patch (7 mg total) onto the skin daily. Remove before bedtime. 28 patch 5   nicotine  polacrilex (NICORETTE ) 2 MG gum Take 1 each (2 mg total) by mouth as needed for smoking cessation. Use every 1-2 hours as needed. 100 tablet 5   ondansetron  (ZOFRAN ) 4 MG tablet Take 1 tablet (4 mg total) by mouth every 8 (eight) hours as needed for nausea or vomiting. 20 tablet 0   OZEMPIC , 2 MG/DOSE, 8 MG/3ML SOPN INJECT 2 MG AS DIRECTED ONCE A WEEK. 9 mL 2   pantoprazole  (PROTONIX ) 40 MG tablet TAKE 1 TABLET (40 MG TOTAL) BY MOUTH DAILY (AM) 30 tablet 3   pravastatin  (PRAVACHOL ) 40 MG tablet TAKE 1 TABLET (40 MG TOTAL) BY MOUTH DAILY (BEDTIME) 30 tablet 0   SUMAtriptan  (IMITREX ) 50 MG tablet Take 1 tablet (50 mg total) by mouth every 2 (two) hours as needed for migraine. May repeat in 2 hours if headache persists or recurs. 10 tablet 0   traZODone  (DESYREL ) 100 MG tablet Take 1 tablet (100 mg total) by mouth at bedtime. 30 tablet 3   No current facility-administered medications on file prior to  visit.    Review of Systems Comprehensive ROS Pertinent positive and negative noted in HPI   Objective:  BP 126/84   Pulse 85   Resp 16   Wt 237 lb 12.8 oz (107.9 kg)   SpO2 97%   BMI 38.97 kg/m   BP Readings from Last 3 Encounters:  08/14/23 126/84  07/21/23 (!) 159/118  07/17/23 120/88    Wt Readings from Last 3 Encounters:  08/14/23 237 lb 12.8 oz (107.9 kg)  07/21/23 225 lb (102.1 kg)  07/17/23 233 lb 9.6 oz (106 kg)    Physical Exam Vitals reviewed.  Constitutional:      Appearance: Normal appearance. She is obese.  HENT:     Head: Normocephalic.  Right Ear: Tympanic membrane, ear canal and external ear normal.     Left Ear: Tympanic membrane, ear canal and external ear normal.     Nose: Nose normal.     Mouth/Throat:     Mouth: Mucous membranes are moist.  Eyes:     Extraocular Movements: Extraocular movements intact.     Pupils: Pupils are equal, round, and reactive to light.  Cardiovascular:     Rate and Rhythm: Normal rate.  Pulmonary:     Effort: Pulmonary effort is normal.     Breath sounds: Normal breath sounds.  Abdominal:     General: Bowel sounds are normal.     Palpations: Abdomen is soft.  Musculoskeletal:        General: Normal range of motion.     Cervical back: Normal range of motion.  Skin:    General: Skin is warm and dry.  Neurological:     Mental Status: She is alert and oriented to person, place, and time.  Psychiatric:        Mood and Affect: Mood normal.        Behavior: Behavior normal.        Thought Content: Thought content normal.    Lab Results  Component Value Date   HGBA1C 10.1 (A) 04/20/2023   HGBA1C 7.1 (A) 09/28/2022   HGBA1C 8.9 (A) 05/12/2022    Lab Results  Component Value Date   WBC 13.3 (H) 07/21/2023   HGB 14.4 07/21/2023   HCT 42.6 07/21/2023   PLT 298 07/21/2023   GLUCOSE 212 (H) 07/21/2023   CHOL 195 05/29/2022   TRIG 103 05/29/2022   HDL 45 05/29/2022   LDLCALC 131 (H) 05/29/2022   ALT 37  03/13/2023   AST 28 03/13/2023   NA 141 07/21/2023   K 3.7 07/21/2023   CL 105 07/21/2023   CREATININE 0.76 07/21/2023   BUN 11 07/21/2023   CO2 26 07/21/2023   TSH 2.870 10/02/2018   HGBA1C 10.1 (A) 04/20/2023     Assessment & Plan:  [name] was seen today for diabetes, new med request and hypertension.  Diagnoses and all orders for this visit:  Type 2 diabetes mellitus without complication, without long-term current use of insulin  (HCC) - educated on lifestyle modifications, including but not limited to diet choices and adding exercise to daily routine.   -     POCT glycosylated hemoglobin (Hb A1C) -     Microalbumin / creatinine urine ratio -     Lipid panel  Essential hypertension Well controlled   Encounter for screening mammogram for malignant neoplasm of breast -     MM DIGITAL SCREENING BILATERAL; Future  Screening for colon cancer -     Ambulatory referral to Gastroenterology  Comprehensive diabetic foot examination, type 2 DM, encounter for [facility]) Completed    Follow-up:  Return in about 3 months (around 11/14/2023).  The above assessment and management plan was discussed with the patient. The patient verbalized understanding of and has agreed to the management plan. Patient is aware to call the clinic if symptoms fail to improve or worsen. Patient is aware when to return to the clinic for a follow-up visit. Patient educated on when it is appropriate to go to the emergency department.   Madelyn Schick, NP-C

## 2023-08-14 NOTE — Telephone Encounter (Signed)
 Pharmacy Patient Advocate Encounter   Received notification from CoverMyMeds that prior authorization for MOUNJARO is required/requested.   Insurance verification completed.   The patient is insured through [facility] .   Per test claim: PA required; PA submitted to above mentioned insurance via CoverMyMeds Key/confirmation #/EOC [facility] Status is pending

## 2023-08-15 ENCOUNTER — Other Ambulatory Visit: Payer: Self-pay

## 2023-08-15 LAB — LIPID PANEL
Chol/HDL Ratio: 4.3 ratio (ref 0.0–4.4)
Cholesterol, Total: 206 mg/dL — ABNORMAL HIGH (ref 100–199)
HDL: 48 mg/dL (ref >39)
LDL Chol Calc (NIH): 137 mg/dL — ABNORMAL HIGH (ref 0–99)
Triglycerides: 117 mg/dL (ref 0–149)
VLDL Cholesterol Cal: 21 mg/dL (ref 5–40)

## 2023-08-15 LAB — MICROALBUMIN / CREATININE URINE RATIO
Creatinine, Urine: 84.5 mg/dL
Microalb/Creat Ratio: 47 mg/g{creat} — ABNORMAL HIGH (ref 0–29)
Microalbumin, Urine: 40.1 ug/mL

## 2023-08-17 ENCOUNTER — Encounter (HOSPITAL_COMMUNITY): Payer: Self-pay | Admitting: Psychiatry

## 2023-08-17 ENCOUNTER — Telehealth (INDEPENDENT_AMBULATORY_CARE_PROVIDER_SITE_OTHER): Payer: Medicaid Other | Admitting: Psychiatry

## 2023-08-17 DIAGNOSIS — F411 Generalized anxiety disorder: Secondary | ICD-10-CM

## 2023-08-17 DIAGNOSIS — R41840 Attention and concentration deficit: Secondary | ICD-10-CM | POA: Diagnosis not present

## 2023-08-17 DIAGNOSIS — F4329 Adjustment disorder with other symptoms: Secondary | ICD-10-CM

## 2023-08-17 DIAGNOSIS — M2550 Pain in unspecified joint: Secondary | ICD-10-CM

## 2023-08-17 DIAGNOSIS — F3341 Major depressive disorder, recurrent, in partial remission: Secondary | ICD-10-CM | POA: Diagnosis not present

## 2023-08-17 MED ORDER — DULOXETINE HCL 60 MG PO CPEP: ORAL_CAPSULE | ORAL | 3 refills | Status: DC

## 2023-08-17 MED ORDER — NICOTINE 7 MG/24HR TD PT24: 7.0000 mg | MEDICATED_PATCH | Freq: Every day | TRANSDERMAL | 5 refills | Status: AC

## 2023-08-17 MED ORDER — NICOTINE POLACRILEX 2 MG MT GUM: 2.0000 mg | CHEWING_GUM | OROMUCOSAL | 5 refills | Status: DC | PRN

## 2023-08-17 MED ORDER — HYDROXYZINE HCL 10 MG PO TABS: 10.0000 mg | ORAL_TABLET | Freq: Three times a day (TID) | ORAL | 3 refills | Status: AC | PRN

## 2023-08-17 MED ORDER — ATOMOXETINE HCL 40 MG PO CAPS: 40.0000 mg | ORAL_CAPSULE | Freq: Every day | ORAL | 3 refills | Status: DC

## 2023-08-17 MED ORDER — TRAZODONE HCL 100 MG PO TABS: 100.0000 mg | ORAL_TABLET | Freq: Every day | ORAL | 3 refills | Status: DC

## 2023-08-17 NOTE — Progress Notes (Signed)
 BH MD/PA/NP OP Progress Note Virtual Visit via Video Note  I connected with [name] on 08/17/23 at  9:30 AM EDT by a video enabled telemedicine application and verified that I am speaking with the correct person using two identifiers.  Location: Patient: Home Provider: Clinic   I discussed the limitations of evaluation and management by telemedicine and the availability of in person appointments. The patient expressed understanding and agreed to proceed.  I provided 30 minutes of non-face-to-face time during this encounter.    08/17/2023 9:43 AM [name]  MRN:  [id number]  Chief Complaint: "My headaches are horrible"  HPI: 53 year old female seen today for follow-up psychiatric evaluation.   She has a psychiatric history of tobacco dependence (in remission) depression and prolonged grief.  Currently she is managed on Strattera  40 mg daily, Nicoderm gum, and Nicorette  7 mg patches,trazodone  100 mg nightly hydroxyzine  10 mg three times daily as needed, and Cymbalta  60 mg twice daily.  She notes her medications are effective in managing her psychiatric conditions.  Today patient was well-groomed, pleasant, cooperative, and engaged in conversation.  She informed Clinical research associate that she is somewhat tired as she is caring for her 54 and 41-year-old grandchildren.  Patient notes that physically and mentally she is in a good place.  She reports that her headaches have improved since being given a new CPAP machine.  She notes that she is sleeping soundly despite having sleep apnea and now wake up with headaches.  Patient also notes that she recently was approved for housing and excited about the new venture.  She reports that she will go for a walk through of the home soon.   Patient requested not to do a GAD-7 or PHQ-9 today as she notes that she is busy with her grandchildren.  A GAD & was conducted on 08/14/2023  at her PCP office and patient scored an 8, at her last visit she scored a 10. A PHQ 9  was also conducted on 08/13/3033 and patient scored an 8, at her last visit she scored a 4.  Today she denies SI/HI/VH, mania, or paranoia.   No medication changes made today.  Patient agreeable to continue medication as prescribed.  No other concerns noted at this time.    Visit Diagnosis:    ICD-10-CM   1. Generalized anxiety disorder  F41.1 DULoxetine  (CYMBALTA ) 60 MG capsule    hydrOXYzine  (ATARAX ) 10 MG tablet    2. MDD (major depressive disorder), recurrent, in partial remission (HCC)  F33.41 DULoxetine  (CYMBALTA ) 60 MG capsule    traZODone  (DESYREL ) 100 MG tablet    3. 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 DULoxetine  (CYMBALTA ) 60 MG capsule    4. Arthralgia, unspecified joint  M25.50     5. Poor concentration  R41.840 atomoxetine  (STRATTERA ) 40 MG capsule          Past Psychiatric History:  MDD, grief with prolonged bereavement reaction, tobacco dependence  Past Medical History:  Past Medical History:  Diagnosis Date   Anxiety    Common migraine with intractable migraine 10/02/2016   Depression    Diabetes mellitus    Hypertension     Past Surgical History:  Procedure Laterality Date   CHOLECYSTECTOMY     TUBAL LIGATION      Family Psychiatric History: Denied any family psychiatric history  Family History:  Family History  Problem Relation Age of Onset   Diabetes Mother    Hypertension Mother    Renal Disease Mother  Cancer Father    Diabetes Sister    Diabetes Brother    Renal Disease Brother    Diabetes Brother    Diabetes Sister    Diabetes Brother    Diabetes Brother    Diabetes Brother    Healthy Daughter    Healthy Daughter     Social History:  Social History   Socioeconomic History   Marital status: Single    Spouse name: Not on file   Number of children: Not on file   Years of education: Not on file   Highest education level: Not on file  Occupational History   Not on file  Tobacco Use   Smoking status: Every Day     Current packs/day: 0.25    Types: Cigarettes   Smokeless tobacco: Never   Tobacco comments:    Smoking 6-7 per day.  Trying to quit.  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  Vaping Use   Vaping status: Former  Substance and Sexual Activity   Alcohol use: No   Drug use: No   Sexual activity: Not on file  Other Topics Concern   Not on file  Social History Narrative   Right handed    Lives with daughter   Caffeine  use: Drinks coffee/tea/soda sometimes   Social Drivers of Corporate investment banker Strain: Low Risk  (06/30/2022)   Overall Financial Resource Strain (CARDIA)    Difficulty of Paying Living Expenses: Not hard at all  Food Insecurity: No Food Insecurity (08/14/2023)   Hunger Vital Sign    Worried About Running Out of Food in the Last Year: Never true    Ran Out of Food in the Last Year: Never true  Transportation Needs: No Transportation Needs (04/20/2023)   PRAPARE - Administrator, Civil Service (Medical): No    Lack of Transportation (Non-Medical): No  Physical Activity: Sufficiently Active (06/30/2022)   Exercise Vital Sign    Days of Exercise per Week: 5 days    Minutes of Exercise per Session: 30 min  Stress: No Stress Concern Present (06/30/2022)   [employer] of Occupational Health - Occupational Stress Questionnaire    Feeling of Stress : Not at all  Social Connections: Socially Integrated (06/30/2022)   Social Connection and Isolation Panel [NHANES]    Frequency of Communication with Friends and Family: More than three times a week    Frequency of Social Gatherings with Friends and Family: More than three times a week    Attends Religious Services: 1 to 4 times per year    Active Member of [employer] or Organizations: Yes    Attends Banker Meetings: 1 to 4 times per year    Marital Status: Living with partner    Allergies:  Allergies  Allergen Reactions   Lisinopril      angioedema   Trulicity  Deryl.Decree ] Other (See Comments)    Gas, abdominal  bloating    Metabolic Disorder Labs: Lab Results  Component Value Date   HGBA1C 9.9 (A) 08/14/2023   No results found for: "PROLACTIN" Lab Results  Component Value Date   CHOL 206 (H) 08/14/2023   TRIG 117 08/14/2023   HDL 48 08/14/2023   CHOLHDL 4.3 08/14/2023   LDLCALC 137 (H) 08/14/2023   LDLCALC 131 (H) 05/29/2022   Lab Results  Component Value Date   TSH 2.870 10/02/2018   TSH 4.840 (H) 01/09/2018    Therapeutic Level Labs: No results found for: "LITHIUM" No results found for: "VALPROATE" No results  found for: "CBMZ"  Current Medications: Current Outpatient Medications  Medication Sig Dispense Refill   Accu-Chek Softclix Lancets lancets Use to check blood sugar three times daily. 100 each 6   albuterol  (VENTOLIN  HFA) 108 (90 Base) MCG/ACT inhaler Inhale 1-2 puffs into the lungs every 6 (six) hours as needed for wheezing or shortness of breath. 8 each 0   albuterol  (VENTOLIN  HFA) 108 (90 Base) MCG/ACT inhaler Inhale 1-2 puffs into the lungs every 6 (six) hours as needed for up to 14 days for wheezing or shortness of breath. 1 each 0   amLODipine  (NORVASC ) 10 MG tablet TAKE 1 TABLET BY MOUTH DAILY. (AM) 30 tablet 0   aspirin  EC 81 MG tablet Take 81 mg by mouth every 6 (six) hours as needed for mild pain.      atomoxetine  (STRATTERA ) 40 MG capsule Take 1 capsule (40 mg total) by mouth daily. 30 capsule 3   blood glucose meter kit and supplies Dispense based on patient and insurance preference. Use up to four times daily as directed. (FOR ICD-10 E10.9, E11.9). 1 each 0   Blood Glucose Monitoring Suppl (ACCU-CHEK GUIDE) w/Device KIT Use to check blood sugar three times daily. 1 kit 0   Continuous Blood Gluc Receiver (FREESTYLE LIBRE 2 READER) DEVI Use to check blood sugar continuously throughout the day. Change sensors once every 14 days. E11.9 1 each 0   Continuous Blood Gluc Sensor (FREESTYLE LIBRE 2 SENSOR) MISC Use to check blood sugar continuously throughout the day.  Change sensors once every 14 days. E11.9 2 each 3   dicyclomine  (BENTYL ) 20 MG tablet Take 1 tablet (20 mg total) by mouth 2 (two) times daily. 20 tablet 0   DULoxetine  (CYMBALTA ) 60 MG capsule TAKE 1 CAPSULE (60 MG TOTAL) BY MOUTH 2 (TWO) TIMES DAILY (AM+BEDTIME) 90 capsule 3   EASY COMFORT PEN NEEDLES 31G X 8 MM MISC USE TO INJECT LEVEMIR  TWICE A DAY 100 each 2   fluticasone  (FLONASE ) 50 MCG/ACT nasal spray PLACE 2 SPRAYS INTO BOTH NOSTRILS DAILY. 16 g 2   fluticasone  (FLOVENT  HFA) 44 MCG/ACT inhaler Inhale 2 puffs into the lungs 2 (two) times daily. 1 Inhaler 12   gabapentin  (NEURONTIN ) 300 MG capsule TAKE ONE CAPSULE BY MOUTH THREE TIMES DAILY ( AM+NOON+BEDTIME) 90 capsule 0   glucose blood (ACCU-CHEK GUIDE) test strip Use to check blood sugar three times daily. 100 each 6   hydrOXYzine  (ATARAX ) 10 MG tablet Take 1 tablet (10 mg total) by mouth 3 (three) times daily as needed. 90 tablet 3   ibuprofen  (ADVIL ) 600 MG tablet Take 1 tablet (600 mg total) by mouth every 8 (eight) hours as needed. 90 tablet 0   insulin  glargine (LANTUS  SOLOSTAR) 100 UNIT/ML Solostar Pen Inject 56 Units into the skin daily. 45 mL 1   losartan  (COZAAR ) 50 MG tablet TAKE 1 TABLET (50 MG TOTAL) BY MOUTH DAILY (AM) 90 tablet 1   metFORMIN  (GLUCOPHAGE ) 1000 MG tablet TAKE ONE TABLET BY MOUTH TWICE DAILY (AM+BEDTIME) 180 tablet 0   naproxen  (NAPROSYN ) 500 MG tablet Take 1 tablet (500 mg total) by mouth 2 (two) times daily. 30 tablet 0   nicotine  (NICODERM CQ  - DOSED IN MG/24 HR) 7 mg/24hr patch Place 1 patch (7 mg total) onto the skin daily. Remove before bedtime. 28 patch 5   nicotine  polacrilex (NICORETTE ) 2 MG gum Take 1 each (2 mg total) by mouth as needed for smoking cessation. Use every 1-2 hours as needed. 100 tablet  5   ondansetron  (ZOFRAN ) 4 MG tablet Take 1 tablet (4 mg total) by mouth every 8 (eight) hours as needed for nausea or vomiting. 20 tablet 0   pantoprazole  (PROTONIX ) 40 MG tablet TAKE 1 TABLET (40 MG  TOTAL) BY MOUTH DAILY (AM) 30 tablet 3   pravastatin  (PRAVACHOL ) 40 MG tablet TAKE 1 TABLET (40 MG TOTAL) BY MOUTH DAILY (BEDTIME) 30 tablet 0   SUMAtriptan  (IMITREX ) 50 MG tablet Take 1 tablet (50 mg total) by mouth every 2 (two) hours as needed for migraine. May repeat in 2 hours if headache persists or recurs. 10 tablet 0   tirzepatide (MOUNJARO) 2.5 MG/0.5ML Pen Inject 2.5 mg into the skin once a week. 2 mL 1   traZODone  (DESYREL ) 100 MG tablet Take 1 tablet (100 mg total) by mouth at bedtime. 30 tablet 3   No current facility-administered medications for this visit.     Musculoskeletal: Strength & Muscle Tone: within normal limits and telehealth visit Gait & Station: normal, telehealth visit Patient leans: N/A  Psychiatric Specialty Exam: Review of Systems  There were no vitals taken for this visit.There is no height or weight on file to calculate BMI.  General Appearance: Well Groomed  Eye Contact:  Good  Speech:  Clear and Coherent and Normal Rate  Volume:  Normal  Mood:  Euthymic,   Affect:  Appropriate and Congruent  Thought Process:  Coherent, Goal Directed, and Linear  Orientation:  Full (Time, Place, and Person)  Thought Content: WDL and Logical   Suicidal Thoughts:  No  Homicidal Thoughts:  No  Memory:  Immediate;   Good Recent;   Good Remote;   Good  Judgement:  Good  Insight:  Good  Psychomotor Activity:  Normal  Concentration:  Concentration: Good and Attention Span: Good  Recall:  Good  Fund of Knowledge: Good  Language: Good  Akathisia:  No  Handed:  Right  AIMS (if indicated): not done  Assets:  Communication Skills Desire for Improvement Financial Resources/Insurance Housing Leisure Time Physical Health Social Support  ADL's:  Intact  Cognition: WNL  Sleep:  Good   Screenings: GAD-7    Flowsheet Row Office Visit from 08/14/2023 in [facility] Video Visit from 05/25/2023 in [facility]  Office Visit from 04/23/2023 in [facility] [facility] Video Visit from 03/30/2023 in [facility] Video Visit from 10/13/2022 in [facility]  Total GAD-7 Score 8 10 0 2 11      PHQ2-9    Flowsheet Row Office Visit from 08/14/2023 in [facility] Video Visit from 05/25/2023 in [facility] Office Visit from 04/23/2023 in [place] [facility] Office Visit from 04/20/2023 in [facility] Video Visit from 03/30/2023 in [facility]  PHQ-2 Total Score 4 0 0 1 3  PHQ-9 Total Score 8 4 5  -- 10      Flowsheet Row ED from 07/21/2023 in [facility] at [facility] ED from 04/22/2023 in [facility] Emergency Department at [facility] ED from 02/26/2023 in [facility] Emergency Department at [facility]  C-SSRS RISK CATEGORY No Risk No Risk No Risk        Assessment and Plan: Patient reports that her physical and mental health has improved.  She informed Clinical research associate that her anxiety and depression are well-managed and notes that her sleep is no  longer problematic with her CPAP.  No medication changes made today.  Patient agreeable to continue medications as prescribed.  1. Generalized anxiety disorder  Continue- DULoxetine  (CYMBALTA ) 60 MG capsule; TAKE 1 CAPSULE (60 MG TOTAL) BY MOUTH 2 (TWO) TIMES DAILY (AM+BEDTIME)  Dispense: 90 capsule; Refill: 3 Continue- hydrOXYzine  (ATARAX ) 10 MG tablet; Take 1 tablet (10 mg total) by mouth 3 (three) times daily as needed.  Dispense: 90 tablet; Refill: 3  2. MDD (major depressive disorder), recurrent, in partial remission (HCC)  Continue- DULoxetine  (CYMBALTA ) 60 MG capsule; TAKE 1 CAPSULE (60 MG TOTAL) BY MOUTH 2 (TWO) TIMES DAILY (AM+BEDTIME)  Dispense: 90 capsule; Refill: 3 Continue- traZODone  (DESYREL ) 100 MG tablet; Take 1 tablet (100 mg total) by mouth at  bedtime.  Dispense: 30 tablet; Refill: 3  3. Grief reaction with prolonged bereavement  Continue- DULoxetine  (CYMBALTA ) 60 MG capsule; TAKE 1 CAPSULE (60 MG TOTAL) BY MOUTH 2 (TWO) TIMES DAILY (AM+BEDTIME)  Dispense: 90 capsule; Refill: 3  4. Poor concentration  Continue- atomoxetine  (STRATTERA ) 40 MG capsule; Take 1 capsule (40 mg total) by mouth daily.  Dispense: 30 capsule; Refill: 3    Follow-up in 2 months Arlyne Bering, NP 08/17/2023, 9:43 AM

## 2023-08-18 ENCOUNTER — Encounter (INDEPENDENT_AMBULATORY_CARE_PROVIDER_SITE_OTHER): Payer: Self-pay | Admitting: Primary Care

## 2023-08-18 ENCOUNTER — Other Ambulatory Visit (INDEPENDENT_AMBULATORY_CARE_PROVIDER_SITE_OTHER): Payer: Self-pay | Admitting: Primary Care

## 2023-08-18 DIAGNOSIS — E785 Hyperlipidemia, unspecified: Secondary | ICD-10-CM

## 2023-08-18 MED ORDER — PRAVASTATIN SODIUM 20 MG PO TABS: 20.0000 mg | ORAL_TABLET | Freq: Every day | ORAL | Status: DC

## 2023-08-18 MED ORDER — PRAVASTATIN SODIUM 20 MG PO TABS: 20.0000 mg | ORAL_TABLET | Freq: Every day | ORAL | 1 refills | Status: DC

## 2023-08-31 ENCOUNTER — Ambulatory Visit

## 2023-09-05 ENCOUNTER — Encounter

## 2023-09-05 ENCOUNTER — Other Ambulatory Visit

## 2023-09-10 ENCOUNTER — Other Ambulatory Visit (INDEPENDENT_AMBULATORY_CARE_PROVIDER_SITE_OTHER): Payer: Self-pay | Admitting: Primary Care

## 2023-09-10 DIAGNOSIS — E119 Type 2 diabetes mellitus without complications: Secondary | ICD-10-CM

## 2023-09-10 DIAGNOSIS — I10 Essential (primary) hypertension: Secondary | ICD-10-CM

## 2023-09-10 DIAGNOSIS — K219 Gastro-esophageal reflux disease without esophagitis: Secondary | ICD-10-CM

## 2023-09-14 DIAGNOSIS — G4733 Obstructive sleep apnea (adult) (pediatric): Secondary | ICD-10-CM | POA: Diagnosis not present

## 2023-09-17 ENCOUNTER — Telehealth: Payer: Self-pay | Admitting: *Deleted

## 2023-09-17 NOTE — Telephone Encounter (Signed)
 ONO reviewed by Roena Clark NP, "no significant desats on CPAP, no oxygen indicated".   Nothing further needed.

## 2023-09-18 ENCOUNTER — Ambulatory Visit (INDEPENDENT_AMBULATORY_CARE_PROVIDER_SITE_OTHER): Admitting: Pulmonary Disease

## 2023-09-18 ENCOUNTER — Encounter: Payer: Self-pay | Admitting: Pulmonary Disease

## 2023-09-18 VITALS — BP 155/91 | HR 66 | Ht 65.0 in | Wt 240.6 lb

## 2023-09-18 DIAGNOSIS — G4733 Obstructive sleep apnea (adult) (pediatric): Secondary | ICD-10-CM

## 2023-09-18 DIAGNOSIS — R400 Somnolence: Secondary | ICD-10-CM | POA: Diagnosis not present

## 2023-09-18 NOTE — Patient Instructions (Signed)
 Continue using your CPAP on a nightly basis  The download from the machine shows it is working well  You can adjust the humidification settings on the machine, the medical supply company may be able to walk due to how to change the humidification settings If the water runs out occasionally, it does not hurt you or the machine  Follow-up in about 4 months  Call with significant concerns

## 2023-09-18 NOTE — Progress Notes (Unsigned)
 [name]    [id number]    1970/07/14  Primary Care Physician:Edwards, Meade Spencer, NP  Referring Physician: Marius Siemens, NP [address] [place],  [place] [address]  Chief complaint:  ***  HPI:  ***  Pets: Occupation: Exposures: Smoking history: Travel history: Relevant family history:  Outpatient Encounter Medications as of 09/18/2023  Medication Sig  . Accu-Chek Softclix Lancets lancets Use to check blood sugar three times daily.  . albuterol  (VENTOLIN  HFA) 108 (90 Base) MCG/ACT inhaler Inhale 1-2 puffs into the lungs every 6 (six) hours as needed for wheezing or shortness of breath.  . amLODipine  (NORVASC ) 10 MG tablet TAKE 1 TABLET BY MOUTH DAILY. (AM)  . aspirin  EC 81 MG tablet Take 81 mg by mouth every 6 (six) hours as needed for mild pain.   . atomoxetine  (STRATTERA ) 40 MG capsule Take 1 capsule (40 mg total) by mouth daily.  . blood glucose meter kit and supplies Dispense based on patient and insurance preference. Use up to four times daily as directed. (FOR ICD-10 E10.9, E11.9).  [name] Blood Glucose Monitoring Suppl (ACCU-CHEK GUIDE) w/Device KIT Use to check blood sugar three times daily.  . celecoxib  (CELEBREX ) 200 MG capsule TAKE ONE CAPSULE BY MOUTH TWICE DAILY (AM+BEDTIME)  . Continuous Blood Gluc Receiver (FREESTYLE LIBRE 2 READER) DEVI Use to check blood sugar continuously throughout the day. Change sensors once every 14 days. E11.9  . Continuous Blood Gluc Sensor (FREESTYLE LIBRE 2 SENSOR) MISC Use to check blood sugar continuously throughout the day. Change sensors once every 14 days. E11.9  . dicyclomine  (BENTYL ) 20 MG tablet Take 1 tablet (20 mg total) by mouth 2 (two) times daily.  . DULoxetine  (CYMBALTA ) 60 MG capsule TAKE 1 CAPSULE (60 MG TOTAL) BY MOUTH 2 (TWO) TIMES DAILY (AM+BEDTIME)  . EASY COMFORT PEN NEEDLES 31G X 8 MM MISC USE TO INJECT LEVEMIR  TWICE A DAY  . fluticasone  (FLONASE ) 50 MCG/ACT nasal spray PLACE 2 SPRAYS INTO BOTH  NOSTRILS DAILY.  . fluticasone  (FLOVENT  HFA) 44 MCG/ACT inhaler Inhale 2 puffs into the lungs 2 (two) times daily.  . gabapentin  (NEURONTIN ) 300 MG capsule TAKE ONE CAPSULE BY MOUTH THREE TIMES DAILY ( AM+NOON+BEDTIME)  . glucose blood (ACCU-CHEK GUIDE) test strip Use to check blood sugar three times daily.  . hydrOXYzine  (ATARAX ) 10 MG tablet Take 1 tablet (10 mg total) by mouth 3 (three) times daily as needed.  . insulin  glargine (LANTUS  SOLOSTAR) 100 UNIT/ML Solostar Pen Inject 56 Units into the skin daily.  . losartan  (COZAAR ) 50 MG tablet TAKE 1 TABLET (50 MG TOTAL) BY MOUTH DAILY (AM)  . metFORMIN  (GLUCOPHAGE ) 1000 MG tablet TAKE ONE TABLET BY MOUTH TWICE DAILY (AM+BEDTIME)  . MOUNJARO  2.5 MG/0.5ML Pen INJECT 2.5 MG INTO THE SKIN ONCE A WEEK.  . naproxen  (NAPROSYN ) 500 MG tablet Take 1 tablet (500 mg total) by mouth 2 (two) times daily.  . nicotine  (NICODERM CQ  - DOSED IN MG/24 HR) 7 mg/24hr patch Place 1 patch (7 mg total) onto the skin daily. Remove before bedtime.  . nicotine  polacrilex (NICORETTE ) 2 MG gum Take 1 each (2 mg total) by mouth as needed for smoking cessation. Use every 1-2 hours as needed.  . ondansetron  (ZOFRAN ) 4 MG tablet Take 1 tablet (4 mg total) by mouth every 8 (eight) hours as needed for nausea or vomiting.  . pantoprazole  (PROTONIX ) 40 MG tablet TAKE 1 TABLET BY MOUTH DAILY (AM)  . pravastatin  (PRAVACHOL )  20 MG tablet Take 1 tablet (20 mg total) by mouth daily.  . SUMAtriptan  (IMITREX ) 50 MG tablet Take 1 tablet (50 mg total) by mouth every 2 (two) hours as needed for migraine. May repeat in 2 hours if headache persists or recurs.  . traZODone  (DESYREL ) 100 MG tablet Take 1 tablet (100 mg total) by mouth at bedtime.  . albuterol  (VENTOLIN  HFA) 108 (90 Base) MCG/ACT inhaler Inhale 1-2 puffs into the lungs every 6 (six) hours as needed for up to 14 days for wheezing or shortness of breath.   No facility-administered encounter medications on file as of 09/18/2023.     Allergies as of 09/18/2023 - Review Complete 09/18/2023  Allergen Reaction Noted  . Lisinopril   10/02/2016  . Trulicity  [dulaglutide ] Other (See Comments) 03/24/2021    Past Medical History:  Diagnosis Date  . Anxiety   . Common migraine with intractable migraine 10/02/2016  . Depression   . Diabetes mellitus   . Hypertension     Past Surgical History:  Procedure Laterality Date  . CHOLECYSTECTOMY    . TUBAL LIGATION      Family History  Problem Relation Age of Onset  . Diabetes Mother   . Hypertension Mother   . Renal Disease Mother   . Cancer Father   . Diabetes Sister   . Diabetes Brother   . Renal Disease Brother   . Diabetes Brother   . Diabetes Sister   . Diabetes Brother   . Diabetes Brother   . Diabetes Brother   . Healthy Daughter   . Healthy Daughter     Social History   Socioeconomic History  . Marital status: Single    Spouse name: Not on file  . Number of children: Not on file  . Years of education: Not on file  . Highest education level: Not on file  Occupational History  . Not on file  Tobacco Use  . Smoking status: Every Day    Current packs/day: 0.25    Types: Cigarettes  . Smokeless tobacco: Never  . Tobacco comments:    Smoking 2-3 per day.  Trying to quit.  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  Vaping Use  . Vaping status: Former  Substance and Sexual Activity  . Alcohol use: No  . Drug use: No  . Sexual activity: Not on file  Other Topics Concern  . Not on file  Social History Narrative   Right handed    Lives with daughter   Caffeine  use: Drinks coffee/tea/soda sometimes   Social Drivers of Corporate investment banker Strain: Low Risk  (06/30/2022)   Overall Financial Resource Strain (CARDIA)   . Difficulty of Paying Living Expenses: Not hard at all  Food Insecurity: No Food Insecurity (08/14/2023)   Hunger Vital Sign   . Worried About Programme researcher, broadcasting/film/video in the Last Year: Never true   . Ran Out of Food in the Last Year: Never true   Transportation Needs: No Transportation Needs (04/20/2023)   PRAPARE - Transportation   . Lack of Transportation (Medical): No   . Lack of Transportation (Non-Medical): No  Physical Activity: Sufficiently Active (06/30/2022)   Exercise Vital Sign   . Days of Exercise per Week: 5 days   . Minutes of Exercise per Session: 30 min  Stress: No Stress Concern Present (06/30/2022)   [employer] of Occupational Health - Occupational Stress Questionnaire   . Feeling of Stress : Not at all  Social Connections: Socially Integrated (06/30/2022)  Social Connection and Isolation Panel [NHANES]   . Frequency of Communication with Friends and Family: More than three times a week   . Frequency of Social Gatherings with Friends and Family: More than three times a week   . Attends Religious Services: 1 to 4 times per year   . Active Member of Clubs or Organizations: Yes   . Attends Banker Meetings: 1 to 4 times per year   . Marital Status: Living with partner  Intimate Partner Violence: Not At Risk (04/20/2023)   Humiliation, Afraid, Rape, and Kick questionnaire   . Fear of Current or Ex-Partner: No   . Emotionally Abused: No   . Physically Abused: No   . Sexually Abused: No    Review of Systems  Vitals:   09/18/23 1004  BP: (!) 155/91  Pulse: 66  SpO2: 94%     Physical Exam   Data Reviewed: ***  Assessment:  ***  Plan/Recommendations: ***   Myer Artis MD [facility] 09/18/2023, 10:33 AM  CC: [name] Siemens, NP

## 2023-09-21 ENCOUNTER — Other Ambulatory Visit

## 2023-09-21 ENCOUNTER — Encounter

## 2023-09-26 ENCOUNTER — Encounter: Payer: Self-pay | Admitting: Adult Health

## 2023-09-28 ENCOUNTER — Encounter: Payer: Self-pay | Admitting: Internal Medicine

## 2023-10-01 ENCOUNTER — Other Ambulatory Visit (HOSPITAL_COMMUNITY): Payer: Self-pay

## 2023-10-04 ENCOUNTER — Telehealth: Payer: Self-pay | Admitting: Pulmonary Disease

## 2023-10-04 ENCOUNTER — Other Ambulatory Visit: Payer: Self-pay

## 2023-10-04 NOTE — Telephone Encounter (Signed)
 Patient does not need supplemental O2 with her CPAP. Only 4 seconds noted with SpO2 88% or less.   Dorn Chill, MD [facility] Office: [phone]

## 2023-10-05 ENCOUNTER — Encounter

## 2023-10-05 NOTE — Telephone Encounter (Signed)
 Lm for patient to call back ( ono results) could not leave message # that was listed was an old cell # need updated HIPAA okay for new #

## 2023-10-08 NOTE — Telephone Encounter (Signed)
 Spoke with patient verbalized understanding   NFN

## 2023-10-08 NOTE — Telephone Encounter (Signed)
 Second attempt to call .LM to return call.

## 2023-10-14 DIAGNOSIS — G4733 Obstructive sleep apnea (adult) (pediatric): Secondary | ICD-10-CM | POA: Diagnosis not present

## 2023-10-15 ENCOUNTER — Ambulatory Visit: Payer: Self-pay

## 2023-10-15 NOTE — Telephone Encounter (Signed)
 noted

## 2023-10-15 NOTE — Telephone Encounter (Signed)
 FYI Only or Action Required?: FYI only for provider.  Patient was last seen in primary care on 08/14/2023 by Celestia Rosaline SQUIBB, NP.  Called Nurse Triage reporting Vaginal Itching.  Symptoms began several weeks ago.  Interventions attempted: OTC medications: Monistat and AZO.  Symptoms are: unchanged.  Triage Disposition: See PCP When Office is Open (Within 3 Days)  Patient/caregiver understands and will follow disposition?: Yes   Last did Monistat 1.5 weeks ago and has been taking AZO BID and not helping with sx. No appts until 10/29/23, offered UC and scheduled appt tomorrow at 1230.  Message from [facility] sent at 10/15/2023  4:03 PM EDT  Summary: pain   Patient states she is having a really bad vaginal itching concern and and when she scratches its turing red and bleeding, and she states this has a fishy smell and she has been experiencing this for 2 weeks and nothing has stopped this concern not the monistat or the azo pills         Reason for Disposition  [1] Symptoms of a yeast infection (i.e., itchy, white discharge, not bad smelling) AND [2] not improved > 3 days following Care Advice  Answer Assessment - Initial Assessment Questions 1. SYMPTOM: What's the main symptom you're concerned about? (e.g., rash, itching, swelling, dryness)     itching 2. LOCATION: Where is the  sx located? (e.g., inside/outside, left/right)     Vaginally  3. ONSET: When did the  sx  start?     2 weeks  4. PAIN: Is there any pain? If Yes, ask: How bad is it? (Scale: 1-10; mild, moderate, severe)   -  MILD (1-3): Doesn't interfere with normal activities.    -  MODERATE (4-7): Interferes with normal activities (e.g., work or school) or awakens from sleep.     -  SEVERE (8-10): Excruciating pain, unable to do any normal activities.     Mild  6. OTHER SYMPTOMS: Do you have any other symptoms? (e.g., fever, vaginal bleeding, pain with urination)     Itching and when scratching causes  bleeding and fishy odor  Protocols used: Vulvar Symptoms-A-AH

## 2023-10-15 NOTE — Telephone Encounter (Signed)
 Patient disconnected call stating that she already has an urgent care appointment scheduled for 10/16/2023 at 1230.  Unable to give any care advice    Copied from CRM [id number]. Topic: Clinical - Pink Word Triage >> Oct 15, 2023  4:00 PM DeAngela L wrote: Reason for Triage: Patient states she is having a really bad vaginal itching concern and and when she scratches its turing red and bleeding, and she states this has a fishy smell and she has been experiencing this for 2 weeks and nothing has stopped this concern not the monistat or the azo pills Answer Assessment - Initial Assessment Questions 1. DISCHARGE: Describe the discharge. (e.g., white, yellow, green, gray, foamy, cottage cheese-like)     Thick yellow discharge 2. ODOR: Is there a bad odor?     Yes, fishy odor 3. ONSET: When did the discharge begin?     2-3 weeks ago 4. RASH: Is there a rash in the genital area? If Yes, ask: Describe it. (e.g., redness, blisters, sores, bumps)     denies 5. ABDOMEN PAIN: Are you having any abdomen pain? If Yes, ask: What does it feel like?  (e.g., crampy, dull, intermittent, constant)      denies   8. OTHER SYMPTOMS: Do you have any other symptoms? (e.g., fever, itching, vaginal bleeding, pain with urination, injury to genital area, vaginal foreign body)     Burning and itching  Protocols used: Vaginal Discharge-A-AH

## 2023-10-16 ENCOUNTER — Encounter (HOSPITAL_COMMUNITY): Payer: Self-pay

## 2023-10-16 ENCOUNTER — Ambulatory Visit (HOSPITAL_COMMUNITY): Admission: EM | Admit: 2023-10-16 | Discharge: 2023-10-16 | Disposition: A | Discharge status: Home / Self Care

## 2023-10-16 ENCOUNTER — Inpatient Hospital Stay (HOSPITAL_COMMUNITY)
Admission: RE | Admit: 2023-10-16 | Discharge: 2023-10-16 | Disposition: A | Discharge status: Home / Self Care | Payer: Self-pay | Source: Ambulatory Visit

## 2023-10-16 DIAGNOSIS — N760 Acute vaginitis: Secondary | ICD-10-CM | POA: Diagnosis not present

## 2023-10-16 DIAGNOSIS — R519 Headache, unspecified: Secondary | ICD-10-CM | POA: Insufficient documentation

## 2023-10-16 LAB — POCT URINALYSIS DIP (MANUAL ENTRY)
Bilirubin, UA: NEGATIVE
Blood, UA: NEGATIVE
Glucose, UA: 100 mg/dL — AB
Ketones, POC UA: NEGATIVE mg/dL
Leukocytes, UA: NEGATIVE
Nitrite, UA: NEGATIVE
Protein Ur, POC: 30 mg/dL — AB
Spec Grav, UA: >=1.03 — AB (ref 1.010–1.025)
Urobilinogen, UA: 0.2 U/dL
pH, UA: 5.5 (ref 5.0–8.0)

## 2023-10-16 MED ORDER — ACETAMINOPHEN 325 MG PO TABS
975.0000 mg | ORAL_TABLET | Freq: Once | ORAL | Status: AC
  Administered 2023-10-16: 975 mg via ORAL

## 2023-10-16 MED ORDER — ACETAMINOPHEN 325 MG PO TABS
ORAL_TABLET | ORAL | Status: AC
  Filled 2023-10-16: qty 3

## 2023-10-16 NOTE — ED Provider Notes (Signed)
 [facility]    CSN: [id number] Arrival date & time: 10/16/23  1227      History   Chief Complaint No chief complaint on file.   HPI [name] is a 53 y.o. female.   HPI Patient is a 53 year old female who presents to the urgent care today with concerns of vaginal irritation, vaginal discharge, and vaginal odor.  She also reports having a headache currently.  She denies any fever, vomiting, rash, severe abdominal pain, or other concerns at this time.  She tried using some 1 day Monistat with no improvement in her symptoms. 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   Hot flashes 10/02/2018   Vaginal dryness 10/02/2018   BIH (benign intracranial hypertension) 11/06/2017   Somnolence, daytime 07/30/2017   Snoring 07/30/2017   Morbid obesity (HCC) 07/30/2017   Migraine 10/02/2016   Common migraine with intractable migraine 10/02/2016   Type 2 diabetes mellitus without complication, without long-term current use of insulin  (HCC) 08/17/2016    Past Surgical History:  Procedure Laterality Date   CHOLECYSTECTOMY     TUBAL LIGATION      OB History     Gravida  5   Para  3   Term  3   Preterm      AB  2   Living  3      SAB  1   IAB  1   Ectopic      Multiple      Live Births               Home Medications    Prior to Admission medications   Medication Sig Start Date End Date Taking? Authorizing Provider  Accu-Chek Softclix Lancets lancets Use to check blood sugar three times daily. 08/04/22  Yes Newlin, Corrina, MD  amLODipine  (NORVASC ) 10 MG tablet TAKE 1 TABLET  BY MOUTH DAILY. (AM) 09/10/23  Yes Celestia Rosaline SQUIBB, NP  aspirin  EC 81 MG tablet Take 81 mg by mouth every 6 (six) hours as needed for mild pain.    Yes [provider]  blood glucose meter kit and supplies Dispense based on patient and insurance preference. Use up to four times daily as directed. (FOR ICD-10 E10.9, E11.9). 01/05/20  Yes Celestia Rosaline SQUIBB, NP  Blood Glucose Monitoring Suppl (ACCU-CHEK GUIDE) w/Device KIT Use to check blood sugar three times daily. 08/04/22  Yes Newlin, Corrina, MD  celecoxib  (CELEBREX ) 200 MG capsule TAKE ONE CAPSULE BY MOUTH TWICE DAILY (AM+BEDTIME) 09/10/23  Yes Celestia Rosaline SQUIBB, NP  Continuous Blood Gluc Receiver (FREESTYLE LIBRE 2 READER) DEVI Use to check blood sugar continuously throughout the day. Change sensors once every 14 days. E11.9 06/30/22  Yes Newlin, Enobong, MD  dicyclomine  (BENTYL ) 20 MG tablet Take 1 tablet (20 mg total) by mouth 2 (two) times daily. 03/14/23  Yes Ali, Amjad, PA-C  DULoxetine  (CYMBALTA ) 60 MG capsule TAKE 1 CAPSULE (60 MG TOTAL) BY MOUTH 2 (TWO) TIMES DAILY (AM+BEDTIME) 08/17/23  Yes Harl Regan E, NP  EASY COMFORT PEN NEEDLES 31G X 8 MM MISC USE TO INJECT LEVEMIR  TWICE  A DAY 10/26/21  Yes Newlin, Enobong, MD  fluticasone  (FLONASE ) 50 MCG/ACT nasal spray PLACE 2 SPRAYS INTO BOTH NOSTRILS DAILY. 10/17/22  Yes Celestia Rosaline SQUIBB, NP  gabapentin  (NEURONTIN ) 300 MG capsule TAKE ONE CAPSULE BY MOUTH THREE TIMES DAILY ( AM+NOON+BEDTIME) 01/23/23  Yes Celestia Rosaline P, NP  glucose blood (ACCU-CHEK GUIDE) test strip Use to check blood sugar three times daily. 08/04/22  Yes Newlin, Enobong, MD  hydrOXYzine  (ATARAX ) 10 MG tablet Take 1 tablet (10 mg total) by mouth 3 (three) times daily as needed. 08/17/23  Yes Harl Regan E, NP  insulin  glargine (LANTUS  SOLOSTAR) 100 UNIT/ML Solostar Pen Inject 56 Units into the skin daily. 08/14/23  Yes Celestia Rosaline SQUIBB, NP  losartan  (COZAAR ) 50 MG tablet TAKE 1 TABLET (50 MG TOTAL) BY  MOUTH DAILY (AM) 08/14/23  Yes Celestia Rosaline SQUIBB, NP  metFORMIN  (GLUCOPHAGE ) 1000 MG tablet TAKE ONE TABLET BY MOUTH TWICE DAILY (AM+BEDTIME) 09/10/23  Yes Celestia Rosaline SQUIBB, NP  MOUNJARO  2.5 MG/0.5ML Pen INJECT 2.5 MG INTO THE SKIN ONCE A WEEK. 09/10/23  Yes Celestia Rosaline SQUIBB, NP  naproxen  (NAPROSYN ) 500 MG tablet Take 1 tablet (500 mg total) by mouth 2 (two) times daily. 07/21/23  Yes Minnie Tinnie BRAVO, PA  pantoprazole  (PROTONIX ) 40 MG tablet TAKE 1 TABLET BY MOUTH DAILY (AM) 09/10/23  Yes Celestia Rosaline SQUIBB, NP  SUMAtriptan  (IMITREX ) 50 MG tablet Take 1 tablet (50 mg total) by mouth every 2 (two) hours as needed for migraine. May repeat in 2 hours if headache persists or recurs. 06/11/23  Yes Celestia Rosaline SQUIBB, NP  albuterol  (VENTOLIN  HFA) 108 (90 Base) MCG/ACT inhaler Inhale 1-2 puffs into the lungs every 6 (six) hours as needed for wheezing or shortness of breath. 11/05/20   Tonette Lauraine HERO, PA-C  albuterol  (VENTOLIN  HFA) 108 (90 Base) MCG/ACT inhaler Inhale 1-2 puffs into the lungs every 6 (six) hours as needed for up to 14 days for wheezing or shortness of breath. 06/07/21 06/21/21  Leath-Warren, Etta PARAS, NP  atomoxetine  (STRATTERA ) 40 MG capsule Take 1 capsule (40 mg total) by mouth daily. 08/17/23   Harl Regan BRAVO, NP  Continuous Blood Gluc Sensor (FREESTYLE LIBRE 2 SENSOR) MISC Use to check blood sugar continuously throughout the day. Change sensors once every 14 days. E11.9 06/30/22   Newlin, Enobong, MD  fluticasone  (FLOVENT  HFA) 44 MCG/ACT inhaler Inhale 2 puffs into the lungs 2 (two) times daily. 01/19/19   Celestia Rosaline SQUIBB, NP  nicotine  (NICODERM CQ  - DOSED IN MG/24 HR) 7 mg/24hr patch Place 1 patch (7 mg total) onto the skin daily. Remove before bedtime. 08/17/23   Harl Regan BRAVO, NP  nicotine  polacrilex (NICORETTE ) 2 MG gum Take 1 each (2 mg total) by mouth as needed for smoking cessation. Use every 1-2 hours as needed. 08/17/23   Harl Regan BRAVO, NP  ondansetron  (ZOFRAN ) 4 MG  tablet Take 1 tablet (4 mg total) by mouth every 8 (eight) hours as needed for nausea or vomiting. 02/15/21   Newlin, Enobong, MD  pravastatin  (PRAVACHOL ) 20 MG tablet Take 1 tablet (20 mg total) by mouth daily. 08/18/23   Celestia Rosaline SQUIBB, NP  traZODone  (DESYREL ) 100 MG tablet Take 1 tablet (100 mg total) by mouth at bedtime. 08/17/23   Harl Regan BRAVO, NP    Family History Family History  Problem Relation Age of Onset   Diabetes Mother    Hypertension Mother    Renal Disease Mother    Cancer Father  Diabetes Sister    Diabetes Brother    Renal Disease Brother    Diabetes Brother    Diabetes Sister    Diabetes Brother    Diabetes Brother    Diabetes Brother    Healthy Daughter    Healthy Daughter     Social History Social History   Tobacco Use   Smoking status: Every Day    Current packs/day: 0.25    Types: Cigarettes   Smokeless tobacco: Never   Tobacco comments:    Smoking 2-3 per day.  Trying to quit.  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  Vaping Use   Vaping status: Former  Substance Use Topics   Alcohol use: No   Drug use: No     Allergies   Lisinopril  and Trulicity  [dulaglutide ]   Review of Systems Review of Systems See HPI for open ROS.  Physical Exam Triage Vital Signs ED Triage Vitals [10/16/23 1315]  Encounter Vitals Group     BP 129/88     Girls Systolic BP Percentile      Girls Diastolic BP Percentile      Boys Systolic BP Percentile      Boys Diastolic BP Percentile      Pulse Rate 93     Resp 18     Temp 98.3 F (36.8 C)     Temp Source Oral     SpO2 98 %     Weight      Height      Head Circumference      Peak Flow      Pain Score      Pain Loc      Pain Education      Exclude from Growth Chart    No data found.  Updated Vital Signs BP 129/88 (BP Location: Left Arm)   Pulse 93   Temp 98.3 F (36.8 C) (Oral)   Resp 18   SpO2 98%   Visual Acuity Right Eye Distance:   Left Eye Distance:   Bilateral Distance:    Right Eye Near:    Left Eye Near:    Bilateral Near:     Physical Exam General: Alert and oriented, well-developed/well-nourished, calm, cooperative, no acute distress HEENT: Normocephalic atraumatic, moist mucous membranes, no scleral icterus, trachea midline Lungs: Speaking full sentences, non-labored respirations, no distress Heart: Regular rate and rhythm Abdomen:  Soft, nondistended, nontender Musculoskeletal: Moves all extremities well GU: Patient performed a self swab and declined an exam by a provider Neurologic: Awake, A&O x4, gait normal Integumentary: Warm, dry, normal for ethnicity, intact, no rash Psychiatric: Appropriate mood & affect  UC Treatments / Results  Labs (all labs ordered are listed, but only abnormal results are displayed) Labs Reviewed  POCT URINALYSIS DIP (MANUAL ENTRY) - Abnormal; Notable for the following components:      Result Value   Glucose, UA =100 (*)    Spec Grav, UA >=1.030 (*)    Protein Ur, POC =30 (*)    All other components within normal limits  CERVICOVAGINAL ANCILLARY ONLY    EKG   Radiology No results found.  Procedures Procedures (including critical care time)  Medications Ordered in UC Medications  acetaminophen  (TYLENOL ) tablet 975 mg (has no administration in time range)    Initial Impression / Assessment and Plan / UC Course  I have reviewed the triage vital signs and the nursing notes.  Pertinent labs & imaging results that were available during my care of the patient were reviewed by me  and considered in my medical decision making (see chart for details).    Presents with vaginal discharge, odor, irritation, and headache.  Differential Diagnosis: Headache, migraine, ICH, STI, candidiasis, BV, trichomoniasis, contact dermatitis, UTI, including other diagnoses.  Rationale: Presents with vaginal discharge, odor, and irritation.  Patient performed a self swab.  We will prescribe something if indicated when the results return.  Patient  was given some Tylenol  for her headache.  UA was negative for leukocytes, nitrites, and blood.  Recommended patient follow-up with their primary care provider for further evaluation and management if symptoms are not improving.  Discussed return precautions including severe abdominal pain, worsening of symptoms, fever, vomiting, or if she has any other concerns.  Disposition: Stable to discharge home.  All questions answered to the best of this examiner's ability. Reviewed possible severe sequelae and other reasons to return to urgent care or ED for further evaluation and/or treatment. Advised to f/u PCP w/in 48 to 72 hours for further eval and/or reassessment. Patient voices understanding of the above and agrees to plan.  An appropriate evaluation has been performed, and in my medical judgment there is currently no evidence of an immediate life-threatening or surgical condition. Discharge is therefore indicated at this time.  This document was created using the aid of voice recognition Scientist, clinical (histocompatibility and immunogenetics).  Final Clinical Impressions(s) / UC Diagnoses   Final diagnoses:  Nonintractable headache, unspecified chronicity pattern, unspecified headache type  Acute vaginitis     Discharge Instructions      We have sent off your vaginal swab.  We should get the results in about 3 to 5 days.  We will call you at that time and prescribe a medication if indicated.  We recommend following up with your primary care provider if symptoms are not improving.  Please return or go to the emergency department if you have any severe abdominal pain, worsening of symptoms, or if you have any other concerns.   ED Prescriptions   None    PDMP not reviewed this encounter.   Melonie Locus, PA-C 10/16/23 1356

## 2023-10-16 NOTE — ED Triage Notes (Signed)
 Vaginal Itching and discharge x 2 weeks. Patient would like STD testing. No blood testing today.

## 2023-10-16 NOTE — Discharge Instructions (Signed)
 We have sent off your vaginal swab.  We should get the results in about 3 to 5 days.  We will call you at that time and prescribe a medication if indicated.  We recommend following up with your primary care provider if symptoms are not improving.  Please return or go to the emergency department if you have any severe abdominal pain, worsening of symptoms, or if you have any other concerns.

## 2023-10-17 ENCOUNTER — Other Ambulatory Visit (INDEPENDENT_AMBULATORY_CARE_PROVIDER_SITE_OTHER): Payer: Self-pay | Admitting: Primary Care

## 2023-10-17 ENCOUNTER — Ambulatory Visit (AMBULATORY_SURGERY_CENTER)

## 2023-10-17 ENCOUNTER — Telehealth (INDEPENDENT_AMBULATORY_CARE_PROVIDER_SITE_OTHER): Payer: Self-pay

## 2023-10-17 VITALS — Ht 65.0 in | Wt 240.0 lb

## 2023-10-17 DIAGNOSIS — Z1211 Encounter for screening for malignant neoplasm of colon: Secondary | ICD-10-CM

## 2023-10-17 DIAGNOSIS — I10 Essential (primary) hypertension: Secondary | ICD-10-CM

## 2023-10-17 LAB — CERVICOVAGINAL ANCILLARY ONLY
Bacterial Vaginitis (gardnerella): POSITIVE — AB
Candida Glabrata: NEGATIVE
Candida Vaginitis: POSITIVE — AB
Chlamydia: NEGATIVE
Comment: NEGATIVE
Comment: NEGATIVE
Comment: NEGATIVE
Comment: NEGATIVE
Comment: NEGATIVE
Comment: NORMAL
Neisseria Gonorrhea: NEGATIVE
Trichomonas: NEGATIVE

## 2023-10-17 MED ORDER — PEG 3350-KCL-NA BICARB-NACL 420 G PO SOLR: 4000.0000 mL | Freq: Once | ORAL | 0 refills | Status: AC

## 2023-10-17 NOTE — Progress Notes (Signed)
 No egg or soy allergy known to patient  No issues known to pt with past sedation with any surgeries or procedures Patient denies ever being told they had issues or difficulty with intubation  No FH of Malignant Hyperthermia  Pt is not on diet pills; Taking Mounjaro  and hold instructions provided. Last dose of Mounjaro  needs to be on or before 10/17/23 (today) RN reiterated this to patient several times and she verbalized understanding.  Pt is not on  home 02  Pt is not on blood thinners  Pt denies issues with constipation  No A fib or A flutter Have any cardiac testing pending--No  Pt can ambulate - will use a cane and walker at times, states she has no issues transferring herself from chair to bed and/or dressing herself independently   Pt denies use of chewing tobacco Discussed diabetic I weight loss medication holds Discussed NSAID holds Checked BMI Pt instructed to use [employer] or GoodRx for a price reduction on prep  Patient's chart reviewed by Norleen Schillings CNRA prior to previsit and patient appropriate for the [facility].  Pre visit completed and red dot placed by patient's name on their procedure day (on provider's schedule).

## 2023-10-17 NOTE — Telephone Encounter (Signed)
 Copied from CRM [id number]. Topic: MyChart - Other >> Oct 15, 2023  4:05 PM DeAngela L wrote: Reason for CRM: patient states she doesn't use myChart  and asked if the office could not send her appt reminders through MyChart

## 2023-10-18 ENCOUNTER — Telehealth (HOSPITAL_COMMUNITY): Payer: Self-pay

## 2023-10-18 MED ORDER — METRONIDAZOLE 500 MG PO TABS: 500.0000 mg | ORAL_TABLET | Freq: Two times a day (BID) | ORAL | 0 refills | Status: AC

## 2023-10-18 MED ORDER — FLUCONAZOLE 150 MG PO TABS: 150.0000 mg | ORAL_TABLET | Freq: Once | ORAL | 0 refills | Status: AC

## 2023-10-18 MED ORDER — LIDOCAINE 5 % EX OINT: 1.0000 | TOPICAL_OINTMENT | Freq: Four times a day (QID) | CUTANEOUS | 0 refills | Status: AC | PRN

## 2023-10-18 NOTE — Telephone Encounter (Signed)
 Per protocol, pt requires tx with metronidazole  and Diflucan . Reviewed with patient, verified pharmacy, prescription sent.  Pt is requesting a topical cream for itching. No relief with topical Monistat. Please advise. Thanks.

## 2023-10-18 NOTE — Addendum Note (Signed)
 Addended by: JOESPH MANUELITA RAMAN on: 10/18/2023 04:42 PM   Modules accepted: Orders

## 2023-10-19 ENCOUNTER — Ambulatory Visit (HOSPITAL_COMMUNITY): Payer: Self-pay

## 2023-10-19 NOTE — Telephone Encounter (Signed)
 Requested Prescriptions  Pending Prescriptions Disp Refills   amLODipine  (NORVASC ) 10 MG tablet [Pharmacy Med Name: AMLODIPINE  BESYLATE 10 MG ORAL TABLET] 30 tablet 0    Sig: TAKE 1 TABLET BY MOUTH DAILY. (AM)     Cardiovascular: Calcium  Channel Blockers 2 Passed - 10/19/2023 11:04 AM      Passed - Last BP in normal range    BP Readings from Last 1 Encounters:  10/16/23 129/88         Passed - Last Heart Rate in normal range    Pulse Readings from Last 1 Encounters:  10/16/23 93         Passed - Valid encounter within last 6 months    Recent Outpatient Visits           2 months ago Type 2 diabetes mellitus without complication, without long-term current use of insulin  (HCC)   [place] [facility] Celestia Rosaline SQUIBB, NP   6 months ago Type 2 diabetes mellitus without complication, without long-term current use of insulin  (HCC)   [place] [facility] Celestia Rosaline SQUIBB, NP   10 months ago Primary osteoarthritis of both knees   [place] [facility] Celestia Rosaline SQUIBB, NP   1 year ago Type 2 diabetes mellitus without complication, without long-term current use of insulin  (HCC)   [place] [facility] Celestia Rosaline SQUIBB, NP   1 year ago Type 2 diabetes mellitus without complication, without long-term current use of insulin  (HCC)    [facility] [place] Fleeta Morris, Stephen L, RPH-CPP               celecoxib  (CELEBREX ) 200 MG capsule [Pharmacy Med Name: CELECOXIB  200 MG ORAL CAPSULE] 60 capsule 0    Sig: TAKE ONE CAPSULE BY MOUTH TWICE DAILY (AM+BEDTIME)     Analgesics:  COX2 Inhibitors Failed - 10/19/2023 11:04 AM      Failed - Manual Review: Labs are only required if the patient has taken medication for more than 8 weeks.      Passed - HGB in normal range and within 360 days    Hemoglobin  Date Value Ref Range Status  07/21/2023 14.4 12.0 -  15.0 g/dL Final  90/69/7977 84.7 11.1 - 15.9 g/dL Final         Passed - Cr in normal range and within 360 days    Creatinine, Ser  Date Value Ref Range Status  07/21/2023 0.76 0.44 - 1.00 mg/dL Final         Passed - HCT in normal range and within 360 days    HCT  Date Value Ref Range Status  07/21/2023 42.6 36.0 - 46.0 % Final   Hematocrit  Date Value Ref Range Status  01/07/2021 46.1 34.0 - 46.6 % Final         Passed - AST in normal range and within 360 days    AST  Date Value Ref Range Status  03/13/2023 28 15 - 41 U/L Final         Passed - ALT in normal range and within 360 days    ALT  Date Value Ref Range Status  03/13/2023 37 0 - 44 U/L Final         Passed - eGFR is 30 or above and within 360 days    GFR calc Af Amer  Date Value Ref Range Status  12/07/2019 >60 >60 mL/min  Final   GFR, Estimated  Date Value Ref Range Status  07/21/2023 >60 >60 mL/min Final    Comment:    (NOTE) Calculated using the CKD-EPI Creatinine Equation (2021)    eGFR  Date Value Ref Range Status  05/29/2022 101 >59 mL/min/1.73 Final         Passed - Patient is not pregnant      Passed - Valid encounter within last 12 months    Recent Outpatient Visits           2 months ago Type 2 diabetes mellitus without complication, without long-term current use of insulin  (HCC)   [place] [facility] Celestia Rosaline SQUIBB, NP   6 months ago Type 2 diabetes mellitus without complication, without long-term current use of insulin  (HCC)   [place] [facility] Celestia Rosaline SQUIBB, NP   10 months ago Primary osteoarthritis of both knees   [place] [facility] Celestia Rosaline SQUIBB, NP   1 year ago Type 2 diabetes mellitus without complication, without long-term current use of insulin  (HCC)   [place] [facility] Celestia Rosaline SQUIBB, NP   1 year ago Type 2 diabetes mellitus without complication, without long-term  current use of insulin  (HCC)   [place] [facility] [place] Fleeta Tonia Garnette LITTIE, RPH-CPP

## 2023-10-23 ENCOUNTER — Ambulatory Visit (INDEPENDENT_AMBULATORY_CARE_PROVIDER_SITE_OTHER): Admitting: Primary Care

## 2023-10-23 ENCOUNTER — Other Ambulatory Visit: Payer: Self-pay | Admitting: Family Medicine

## 2023-10-23 ENCOUNTER — Encounter (INDEPENDENT_AMBULATORY_CARE_PROVIDER_SITE_OTHER): Payer: Self-pay | Admitting: Primary Care

## 2023-10-23 VITALS — BP 158/95 | HR 83 | Resp 16 | Wt 236.2 lb

## 2023-10-23 DIAGNOSIS — M170 Bilateral primary osteoarthritis of knee: Secondary | ICD-10-CM | POA: Diagnosis not present

## 2023-10-23 NOTE — Progress Notes (Signed)
 [name], is a 53 y.o. female  RDW:[id number]  FMW:[id number]  DOB - 08/23/1970  Chief Complaint  Patient presents with   Referral    othro       Subjective:   [name] is a 53 y.o. female here today for an acute visit.  Patient is complaining of a bilateral knee pain and has a primary diagnosis of osteoarthritis of both knees requesting to be referred to orthopedics for reevaluation.  Blood pressure is elevated was discussed personal problem she is a current at home which made her upset, agitated and angry.  HPI  No problems updated.  Comprehensive ROS Pertinent positive and negative noted in HPI   Allergies  Allergen Reactions   Lisinopril  Swelling    angioedema   Trulicity  [Dulaglutide ] Other (See Comments)    Gas, abdominal bloating    Past Medical History:  Diagnosis Date   Anxiety    Asthma    Common migraine with intractable migraine 10/02/2016   Depression    Diabetes mellitus    Hyperlipidemia    Hypertension    Sleep apnea     Current Outpatient Medications on File Prior to Visit  Medication Sig Dispense Refill   Accu-Chek Softclix Lancets lancets Use to check blood sugar three times daily. 100 each 6   albuterol  (VENTOLIN  HFA) 108 (90 Base) MCG/ACT inhaler Inhale 1-2 puffs into the lungs every 6 (six) hours as needed for wheezing or shortness of breath. 8 each 0   albuterol  (VENTOLIN  HFA) 108 (90 Base) MCG/ACT inhaler Inhale 1-2 puffs into the lungs every 6 (six) hours as needed for up to 14 days for wheezing or shortness of breath. 1 each 0   amLODipine  (NORVASC ) 10 MG tablet TAKE 1 TABLET BY MOUTH DAILY. (AM) 30 tablet 0   aspirin  EC 81 MG tablet Take 81 mg by mouth every 6 (six) hours as needed for mild pain.      atomoxetine  (STRATTERA ) 40 MG capsule Take 1 capsule (40 mg total) by mouth daily. 30 capsule 3   blood glucose meter kit and supplies Dispense based on patient and insurance preference. Use up to  four times daily as directed. (FOR ICD-10 E10.9, E11.9). 1 each 0   Blood Glucose Monitoring Suppl (ACCU-CHEK GUIDE) w/Device KIT Use to check blood sugar three times daily. 1 kit 0   Butalbital -APAP-Caffeine  50-300-40 MG CAPS Take 1 capsule by mouth as needed.     celecoxib  (CELEBREX ) 200 MG capsule TAKE ONE CAPSULE BY MOUTH TWICE DAILY (AM+BEDTIME) 60 capsule 0   cetirizine  (ZYRTEC ) 10 MG tablet Take 10 mg by mouth as needed.     Continuous Blood Gluc Receiver (FREESTYLE LIBRE 2 READER) DEVI Use to check blood sugar continuously throughout the day. Change sensors once every 14 days. E11.9 1 each 0   Continuous Blood Gluc Sensor (FREESTYLE LIBRE 2 SENSOR) MISC Use to check blood sugar continuously throughout the day. Change sensors once every 14 days. E11.9 2 each 3   dicyclomine  (BENTYL ) 20 MG tablet Take 1 tablet (20 mg total) by mouth 2 (two) times daily. 20 tablet 0   DULoxetine  (CYMBALTA ) 60 MG capsule TAKE 1 CAPSULE (60 MG TOTAL) BY MOUTH 2 (TWO) TIMES DAILY (AM+BEDTIME) 90 capsule 3   EASY COMFORT PEN NEEDLES 31G X 8 MM MISC USE TO INJECT LEVEMIR  TWICE A DAY 100 each 2   escitalopram  (LEXAPRO ) 20 MG tablet Take 20 mg by mouth daily.     fluticasone  (FLONASE )  50 MCG/ACT nasal spray PLACE 2 SPRAYS INTO BOTH NOSTRILS DAILY. 16 g 2   fluticasone  (FLOVENT  HFA) 44 MCG/ACT inhaler Inhale 2 puffs into the lungs 2 (two) times daily. 1 Inhaler 12   gabapentin  (NEURONTIN ) 300 MG capsule TAKE ONE CAPSULE BY MOUTH THREE TIMES DAILY ( AM+NOON+BEDTIME) (Patient not taking: Reported on 10/17/2023) 90 capsule 0   glucose blood (ACCU-CHEK GUIDE) test strip Use to check blood sugar three times daily. 100 each 6   hydrOXYzine  (ATARAX ) 10 MG tablet Take 1 tablet (10 mg total) by mouth 3 (three) times daily as needed. (Patient not taking: Reported on 10/17/2023) 90 tablet 3   insulin  glargine (LANTUS  SOLOSTAR) 100 UNIT/ML Solostar Pen Inject 56 Units into the skin daily. 45 mL 1   lidocaine  (XYLOCAINE ) 5 % ointment  Apply 1 Application topically 4 (four) times daily as needed. 35.44 g 0   losartan  (COZAAR ) 50 MG tablet TAKE 1 TABLET (50 MG TOTAL) BY MOUTH DAILY (AM) 90 tablet 1   metFORMIN  (GLUCOPHAGE ) 1000 MG tablet TAKE ONE TABLET BY MOUTH TWICE DAILY (AM+BEDTIME) 180 tablet 0   metroNIDAZOLE  (FLAGYL ) 500 MG tablet Take 1 tablet (500 mg total) by mouth 2 (two) times daily for 7 days. 14 tablet 0   MOUNJARO  2.5 MG/0.5ML Pen INJECT 2.5 MG INTO THE SKIN ONCE A WEEK. 2 mL 1   naproxen  (NAPROSYN ) 500 MG tablet Take 1 tablet (500 mg total) by mouth 2 (two) times daily. (Patient not taking: Reported on 10/17/2023) 30 tablet 0   nicotine  (NICODERM CQ  - DOSED IN MG/24 HR) 7 mg/24hr patch Place 1 patch (7 mg total) onto the skin daily. Remove before bedtime. 28 patch 5   nicotine  polacrilex (NICORETTE ) 2 MG gum Take 1 each (2 mg total) by mouth as needed for smoking cessation. Use every 1-2 hours as needed. (Patient not taking: Reported on 10/17/2023) 100 tablet 5   ondansetron  (ZOFRAN ) 4 MG tablet Take 1 tablet (4 mg total) by mouth every 8 (eight) hours as needed for nausea or vomiting. (Patient not taking: Reported on 10/17/2023) 20 tablet 0   pantoprazole  (PROTONIX ) 40 MG tablet TAKE 1 TABLET BY MOUTH DAILY (AM) (Patient not taking: Reported on 10/17/2023) 30 tablet 3   pravastatin  (PRAVACHOL ) 20 MG tablet Take 1 tablet (20 mg total) by mouth daily. 90 tablet 1   SUMAtriptan  (IMITREX ) 50 MG tablet Take 1 tablet (50 mg total) by mouth every 2 (two) hours as needed for migraine. May repeat in 2 hours if headache persists or recurs. 10 tablet 0   traZODone  (DESYREL ) 100 MG tablet Take 1 tablet (100 mg total) by mouth at bedtime. (Patient not taking: Reported on 10/17/2023) 30 tablet 3   No current facility-administered medications on file prior to visit.   Health Maintenance  Topic Date Due   Hepatitis B Vaccine (1 of 3 - 19+ 3-dose series) Never done   Colon Cancer Screening  Never done   Zoster (Shingles) Vaccine (1 of 2)  Never done   Mammogram  03/27/2021   Pap with HPV screening  10/09/2021   COVID-19 Vaccine (2 - 2024-25 season) 12/10/2022   Pneumococcal Vaccination (1 of 2 - PCV) 04/19/2024*   Flu Shot  11/09/2023   Hemoglobin A1C  02/14/2024   Eye exam for diabetics  06/06/2024   Yearly kidney function blood test for diabetes  07/20/2024   Yearly kidney health urinalysis for diabetes  08/13/2024   Complete foot exam   08/13/2024   DTaP/Tdap/Td vaccine (  3 - Td or Tdap) 10/13/2029   Hepatitis C Screening  Completed   HIV Screening  Completed   HPV Vaccine  Aged Out   Meningitis B Vaccine  Aged Out  *Topic was postponed. The date shown is not the original due date.    Objective:   Vitals:   10/23/23 1403 10/23/23 1404  BP: (!) 161/96 (!) 158/95  Pulse: 83   Resp: 16   SpO2: 98%   Weight: 236 lb 3.2 oz (107.1 kg)    BP Readings from Last 3 Encounters:  10/23/23 (!) 158/95  10/16/23 129/88  09/18/23 (!) 155/91      Physical Exam Vitals reviewed.  Constitutional:      Appearance: She is obese.  HENT:     Head: Normocephalic.     Right Ear: External ear normal.     Left Ear: External ear normal.     Nose: Nose normal.  Cardiovascular:     Rate and Rhythm: Normal rate and regular rhythm.  Pulmonary:     Effort: Pulmonary effort is normal.     Breath sounds: Normal breath sounds.  Abdominal:     General: Bowel sounds are normal. There is distension.     Palpations: Abdomen is soft.  Musculoskeletal:        General: Swelling and tenderness present.     Cervical back: Normal range of motion and neck supple.     Comments: Bilateral knee pain and tenderness  Skin:    General: Skin is warm and dry.  Neurological:     General: No focal deficit present.  Psychiatric:        Mood and Affect: Mood normal.        Behavior: Behavior normal.     Assessment & Plan  Areebah was seen today for referral.  Diagnoses and all orders for this visit:  Primary osteoarthritis of both  knees -     Ambulatory referral to Orthopedic Surgery     Patient have been counseled extensively about nutrition and exercise. Other issues discussed during this visit include: low cholesterol diet, weight control and daily exercise, foot care, annual eye examinations at Ophthalmology, importance of adherence with medications and regular follow-up. We also discussed long term complications of uncontrolled diabetes and hypertension.   No follow-ups on file.  The patient was given clear instructions to go to ER or return to medical center if symptoms don't improve, worsen or new problems develop. The patient verbalized understanding. The patient was told to call to get lab results if they haven't heard anything in the next week.   This note has been created with Education officer, environmental. Any transcriptional errors are unintentional.   [name], NP 10/23/2023, 4:47 PM

## 2023-10-25 ENCOUNTER — Encounter: Payer: Self-pay | Admitting: Internal Medicine

## 2023-10-25 ENCOUNTER — Ambulatory Visit (INDEPENDENT_AMBULATORY_CARE_PROVIDER_SITE_OTHER): Admitting: Primary Care

## 2023-10-25 ENCOUNTER — Ambulatory Visit: Admitting: Internal Medicine

## 2023-10-25 VITALS — BP 133/73 | HR 72 | Temp 97.3°F | Resp 18 | Ht 65.0 in | Wt 240.0 lb

## 2023-10-25 DIAGNOSIS — K5730 Diverticulosis of large intestine without perforation or abscess without bleeding: Secondary | ICD-10-CM | POA: Diagnosis not present

## 2023-10-25 DIAGNOSIS — F32A Depression, unspecified: Secondary | ICD-10-CM | POA: Diagnosis not present

## 2023-10-25 DIAGNOSIS — Z1211 Encounter for screening for malignant neoplasm of colon: Secondary | ICD-10-CM | POA: Diagnosis not present

## 2023-10-25 DIAGNOSIS — E119 Type 2 diabetes mellitus without complications: Secondary | ICD-10-CM | POA: Diagnosis not present

## 2023-10-25 DIAGNOSIS — D125 Benign neoplasm of sigmoid colon: Secondary | ICD-10-CM | POA: Diagnosis not present

## 2023-10-25 DIAGNOSIS — G4730 Sleep apnea, unspecified: Secondary | ICD-10-CM | POA: Diagnosis not present

## 2023-10-25 DIAGNOSIS — I10 Essential (primary) hypertension: Secondary | ICD-10-CM | POA: Diagnosis not present

## 2023-10-25 DIAGNOSIS — K648 Other hemorrhoids: Secondary | ICD-10-CM

## 2023-10-25 DIAGNOSIS — F419 Anxiety disorder, unspecified: Secondary | ICD-10-CM | POA: Diagnosis not present

## 2023-10-25 MED ORDER — SODIUM CHLORIDE 0.9 % IV SOLN: 500.0000 mL | Freq: Once | INTRAVENOUS | Status: DC

## 2023-10-25 NOTE — Progress Notes (Signed)
 VSS. Report given to receiving RN  K.Taft, CRNA

## 2023-10-25 NOTE — Progress Notes (Signed)
 Pt's states no medical or surgical changes since previsit or office visit.

## 2023-10-25 NOTE — Op Note (Signed)
 [facility] Patient Name: [name] Procedure Date: 10/25/2023 10:46 AM MRN: [id number] Endoscopist: Rosario Estefana Kidney , , [id number] Age: 53 Referring MD:  Date of Birth: 1970-12-11 Gender: Female Account #: [id number] Procedure:                Colonoscopy Indications:              Screening patient at increased risk: Family history                            of 1st-degree relative with colorectal cancer at                            age 59 years (or older) Medicines:                Monitored Anesthesia Care Procedure:                Pre-Anesthesia Assessment:                           - Prior to the procedure, a History and Physical                            was performed, and patient medications and                            allergies were reviewed. The patient's tolerance of                            previous anesthesia was also reviewed. The risks                            and benefits of the procedure and the sedation                            options and risks were discussed with the patient.                            All questions were answered, and informed consent                            was obtained. Prior Anticoagulants: The patient has                            taken no anticoagulant or antiplatelet agents. ASA                            Grade Assessment: II - A patient with mild systemic                            disease. After reviewing the risks and benefits,                            the patient was deemed in satisfactory condition to  undergo the procedure.                           After obtaining informed consent, the colonoscope                            was passed under direct vision. Throughout the                            procedure, the patient's blood pressure, pulse, and                            oxygen saturations were monitored continuously. The                            Olympus Scope [id number]  was introduced through the                            anus and advanced to the the terminal ileum. The                            colonoscopy was performed without difficulty. The                            patient tolerated the procedure well. The quality                            of the bowel preparation was excellent. The                            terminal ileum, ileocecal valve, appendiceal                            orifice, and rectum were photographed. Scope In: 11:01:13 AM Scope Out: 11:18:59 AM Scope Withdrawal Time: 0 hours 15 minutes 44 seconds  Total Procedure Duration: 0 hours 17 minutes 46 seconds  Findings:                 The terminal ileum appeared normal.                           A 3 mm polyp was found in the sigmoid colon. The                            polyp was sessile. The polyp was removed with a                            cold snare. Resection and retrieval were complete.                           A few diverticula were found in the sigmoid colon.                           Non-bleeding internal hemorrhoids were found during  retroflexion. Complications:            No immediate complications. Estimated Blood Loss:     Estimated blood loss was minimal. Impression:               - The examined portion of the ileum was normal.                           - One 3 mm polyp in the sigmoid colon, removed with                            a cold snare. Resected and retrieved.                           - Diverticulosis in the sigmoid colon.                           - Non-bleeding internal hemorrhoids. Recommendation:           - Discharge patient to home (with escort).                           - Await pathology results.                           - The findings and recommendations were discussed                            with the patient. Dr Estefana Federico Rosario Estefana Federico,  10/25/2023 11:21:49 AM

## 2023-10-25 NOTE — Progress Notes (Signed)
 GASTROENTEROLOGY PROCEDURE H&P NOTE   Primary Care Physician: Celestia Rosaline SQUIBB, NP    Reason for Procedure:   Colon cancer screening  Plan:    Colonoscopy  Patient is appropriate for endoscopic procedure(s) in the ambulatory ([facility]) setting.  The nature of the procedure, as well as the risks, benefits, and alternatives were carefully and thoroughly reviewed with the patient. Ample time for discussion and questions allowed. The patient understood, was satisfied, and agreed to proceed.     HPI: [name] is a 53 y.o. female who presents for colonoscopy for colon cancer screening. Denies blood in stools, changes in bowel habits, or unintentional weight loss. Father was diagnosed with colon cancer at age 75. Her last colonoscopy was 10 years ago and was normal.   Past Medical History:  Diagnosis Date   Anxiety    Asthma    Common migraine with intractable migraine 10/02/2016   Depression    Diabetes mellitus    Hyperlipidemia    Hypertension    Sleep apnea     Past Surgical History:  Procedure Laterality Date   CHOLECYSTECTOMY     TUBAL LIGATION      Prior to Admission medications   Medication Sig Start Date End Date Taking? Authorizing Provider  Accu-Chek Softclix Lancets lancets Use to check blood sugar three times daily. 08/04/22   Newlin, Enobong, MD  albuterol  (VENTOLIN  HFA) 108 (90 Base) MCG/ACT inhaler Inhale 1-2 puffs into the lungs every 6 (six) hours as needed for wheezing or shortness of breath. 11/05/20   Covington, Lauraine HERO, PA-C  albuterol  (VENTOLIN  HFA) 108 (90 Base) MCG/ACT inhaler Inhale 1-2 puffs into the lungs every 6 (six) hours as needed for up to 14 days for wheezing or shortness of breath. 06/07/21 10/17/23  Leath-Warren, Etta PARAS, NP  amLODipine  (NORVASC ) 10 MG tablet TAKE 1 TABLET BY MOUTH DAILY. (AM) 10/19/23   Celestia Rosaline SQUIBB, NP  aspirin  EC 81 MG tablet Take 81 mg by mouth every 6 (six) hours as needed for mild pain.     [provider]  atomoxetine  (STRATTERA ) 40 MG capsule Take 1 capsule (40 mg total) by mouth daily. 08/17/23   Harl Zane BRAVO, NP  blood glucose meter kit and supplies Dispense based on patient and insurance preference. Use up to four times daily as directed. (FOR ICD-10 E10.9, E11.9). 01/05/20   Celestia Rosaline SQUIBB, NP  Blood Glucose Monitoring Suppl (ACCU-CHEK GUIDE) w/Device KIT Use to check blood sugar three times daily. 08/04/22   Newlin, Enobong, MD  Butalbital -APAP-Caffeine  50-300-40 MG CAPS Take 1 capsule by mouth as needed. 08/01/17   [provider]  celecoxib  (CELEBREX ) 200 MG capsule TAKE ONE CAPSULE BY MOUTH TWICE DAILY (AM+BEDTIME) 10/19/23   Celestia Rosaline SQUIBB, NP  cetirizine  (ZYRTEC ) 10 MG tablet Take 10 mg by mouth as needed. 11/07/16   [provider]  Continuous Blood Gluc Receiver (FREESTYLE LIBRE 2 READER) DEVI Use to check blood sugar continuously throughout the day. Change sensors once every 14 days. E11.9 06/30/22   Delbert Clam, MD  Continuous Blood Gluc Sensor (FREESTYLE LIBRE 2 SENSOR) MISC Use to check blood sugar continuously throughout the day. Change sensors once every 14 days. E11.9 06/30/22   Newlin, Enobong, MD  dicyclomine  (BENTYL ) 20 MG tablet Take 1 tablet (20 mg total) by mouth 2 (two) times daily. 03/14/23   Hildegard, Amjad, PA-C  DULoxetine  (CYMBALTA ) 60 MG capsule TAKE 1 CAPSULE (60 MG TOTAL) BY MOUTH 2 (TWO) TIMES DAILY (AM+BEDTIME)  08/17/23   Harl Zane BRAVO, NP  escitalopram  (LEXAPRO ) 20 MG tablet Take 20 mg by mouth daily. 08/01/17   [provider]  fluticasone  (FLONASE ) 50 MCG/ACT nasal spray PLACE 2 SPRAYS INTO BOTH NOSTRILS DAILY. 10/17/22   Celestia Rosaline SQUIBB, NP  fluticasone  (FLOVENT  HFA) 44 MCG/ACT inhaler Inhale 2 puffs into the lungs 2 (two) times daily. 01/19/19   Celestia Rosaline SQUIBB, NP  gabapentin  (NEURONTIN ) 300 MG capsule TAKE ONE CAPSULE BY MOUTH THREE TIMES DAILY ( AM+NOON+BEDTIME) Patient not taking: Reported on 10/17/2023  01/23/23   Celestia Rosaline SQUIBB, NP  glucose blood (ACCU-CHEK GUIDE) test strip Use to check blood sugar three times daily. 08/04/22   Newlin, Enobong, MD  hydrOXYzine  (ATARAX ) 10 MG tablet Take 1 tablet (10 mg total) by mouth 3 (three) times daily as needed. Patient not taking: Reported on 10/17/2023 08/17/23   Harl Zane BRAVO, NP  insulin  glargine (LANTUS  SOLOSTAR) 100 UNIT/ML Solostar Pen Inject 56 Units into the skin daily. 08/14/23   Celestia Rosaline SQUIBB, NP  Insulin  Pen Needle (EASY COMFORT PEN NEEDLES) 31G X 8 MM MISC USE TO INJECT Lantus  once A DAY 10/23/23   Delbert Clam, MD  lidocaine  (XYLOCAINE ) 5 % ointment Apply 1 Application topically 4 (four) times daily as needed. 10/18/23   Joesph Shaver Scales, PA-C  losartan  (COZAAR ) 50 MG tablet TAKE 1 TABLET (50 MG TOTAL) BY MOUTH DAILY (AM) 08/14/23   Celestia Rosaline SQUIBB, NP  metFORMIN  (GLUCOPHAGE ) 1000 MG tablet TAKE ONE TABLET BY MOUTH TWICE DAILY (AM+BEDTIME) 09/10/23   Celestia Rosaline SQUIBB, NP  metroNIDAZOLE  (FLAGYL ) 500 MG tablet Take 1 tablet (500 mg total) by mouth 2 (two) times daily for 7 days. 10/18/23 10/25/23  Vonna Sharlet POUR, MD  MOUNJARO  2.5 MG/0.5ML Pen INJECT 2.5 MG INTO THE SKIN ONCE A WEEK. 09/10/23   Celestia Rosaline SQUIBB, NP  naproxen  (NAPROSYN ) 500 MG tablet Take 1 tablet (500 mg total) by mouth 2 (two) times daily. Patient not taking: Reported on 10/17/2023 07/21/23   Minnie Tinnie BRAVO, PA  nicotine  (NICODERM CQ  - DOSED IN MG/24 HR) 7 mg/24hr patch Place 1 patch (7 mg total) onto the skin daily. Remove before bedtime. 08/17/23   Harl Zane BRAVO, NP  nicotine  polacrilex (NICORETTE ) 2 MG gum Take 1 each (2 mg total) by mouth as needed for smoking cessation. Use every 1-2 hours as needed. Patient not taking: Reported on 10/17/2023 08/17/23   Harl Zane BRAVO, NP  ondansetron  (ZOFRAN ) 4 MG tablet Take 1 tablet (4 mg total) by mouth every 8 (eight) hours as needed for nausea or vomiting. Patient not taking: Reported on 10/17/2023 02/15/21    Newlin, Enobong, MD  pantoprazole  (PROTONIX ) 40 MG tablet TAKE 1 TABLET BY MOUTH DAILY (AM) Patient not taking: Reported on 10/17/2023 09/10/23   Celestia Rosaline SQUIBB, NP  pravastatin  (PRAVACHOL ) 20 MG tablet Take 1 tablet (20 mg total) by mouth daily. 08/18/23   Celestia Rosaline SQUIBB, NP  SUMAtriptan  (IMITREX ) 50 MG tablet Take 1 tablet (50 mg total) by mouth every 2 (two) hours as needed for migraine. May repeat in 2 hours if headache persists or recurs. 06/11/23   Celestia Rosaline SQUIBB, NP  traZODone  (DESYREL ) 100 MG tablet Take 1 tablet (100 mg total) by mouth at bedtime. Patient not taking: Reported on 10/17/2023 08/17/23   Harl Zane BRAVO, NP    Current Outpatient Medications  Medication Sig Dispense Refill   Accu-Chek Softclix Lancets lancets Use to check blood sugar three times daily. 100  each 6   albuterol  (VENTOLIN  HFA) 108 (90 Base) MCG/ACT inhaler Inhale 1-2 puffs into the lungs every 6 (six) hours as needed for wheezing or shortness of breath. 8 each 0   albuterol  (VENTOLIN  HFA) 108 (90 Base) MCG/ACT inhaler Inhale 1-2 puffs into the lungs every 6 (six) hours as needed for up to 14 days for wheezing or shortness of breath. 1 each 0   amLODipine  (NORVASC ) 10 MG tablet TAKE 1 TABLET BY MOUTH DAILY. (AM) 30 tablet 0   aspirin  EC 81 MG tablet Take 81 mg by mouth every 6 (six) hours as needed for mild pain.      atomoxetine  (STRATTERA ) 40 MG capsule Take 1 capsule (40 mg total) by mouth daily. 30 capsule 3   blood glucose meter kit and supplies Dispense based on patient and insurance preference. Use up to four times daily as directed. (FOR ICD-10 E10.9, E11.9). 1 each 0   Blood Glucose Monitoring Suppl (ACCU-CHEK GUIDE) w/Device KIT Use to check blood sugar three times daily. 1 kit 0   Butalbital -APAP-Caffeine  50-300-40 MG CAPS Take 1 capsule by mouth as needed.     celecoxib  (CELEBREX ) 200 MG capsule TAKE ONE CAPSULE BY MOUTH TWICE DAILY (AM+BEDTIME) 60 capsule 0   cetirizine  (ZYRTEC ) 10 MG tablet  Take 10 mg by mouth as needed.     Continuous Blood Gluc Receiver (FREESTYLE LIBRE 2 READER) DEVI Use to check blood sugar continuously throughout the day. Change sensors once every 14 days. E11.9 1 each 0   Continuous Blood Gluc Sensor (FREESTYLE LIBRE 2 SENSOR) MISC Use to check blood sugar continuously throughout the day. Change sensors once every 14 days. E11.9 2 each 3   dicyclomine  (BENTYL ) 20 MG tablet Take 1 tablet (20 mg total) by mouth 2 (two) times daily. 20 tablet 0   DULoxetine  (CYMBALTA ) 60 MG capsule TAKE 1 CAPSULE (60 MG TOTAL) BY MOUTH 2 (TWO) TIMES DAILY (AM+BEDTIME) 90 capsule 3   escitalopram  (LEXAPRO ) 20 MG tablet Take 20 mg by mouth daily.     fluticasone  (FLONASE ) 50 MCG/ACT nasal spray PLACE 2 SPRAYS INTO BOTH NOSTRILS DAILY. 16 g 2   fluticasone  (FLOVENT  HFA) 44 MCG/ACT inhaler Inhale 2 puffs into the lungs 2 (two) times daily. 1 Inhaler 12   gabapentin  (NEURONTIN ) 300 MG capsule TAKE ONE CAPSULE BY MOUTH THREE TIMES DAILY ( AM+NOON+BEDTIME) (Patient not taking: Reported on 10/17/2023) 90 capsule 0   glucose blood (ACCU-CHEK GUIDE) test strip Use to check blood sugar three times daily. 100 each 6   hydrOXYzine  (ATARAX ) 10 MG tablet Take 1 tablet (10 mg total) by mouth 3 (three) times daily as needed. (Patient not taking: Reported on 10/17/2023) 90 tablet 3   insulin  glargine (LANTUS  SOLOSTAR) 100 UNIT/ML Solostar Pen Inject 56 Units into the skin daily. 45 mL 1   Insulin  Pen Needle (EASY COMFORT PEN NEEDLES) 31G X 8 MM MISC USE TO INJECT Lantus  once A DAY 100 each 2   lidocaine  (XYLOCAINE ) 5 % ointment Apply 1 Application topically 4 (four) times daily as needed. 35.44 g 0   losartan  (COZAAR ) 50 MG tablet TAKE 1 TABLET (50 MG TOTAL) BY MOUTH DAILY (AM) 90 tablet 1   metFORMIN  (GLUCOPHAGE ) 1000 MG tablet TAKE ONE TABLET BY MOUTH TWICE DAILY (AM+BEDTIME) 180 tablet 0   metroNIDAZOLE  (FLAGYL ) 500 MG tablet Take 1 tablet (500 mg total) by mouth 2 (two) times daily for 7 days. 14  tablet 0   MOUNJARO  2.5 MG/0.5ML Pen INJECT 2.5  MG INTO THE SKIN ONCE A WEEK. 2 mL 1   naproxen  (NAPROSYN ) 500 MG tablet Take 1 tablet (500 mg total) by mouth 2 (two) times daily. (Patient not taking: Reported on 10/17/2023) 30 tablet 0   nicotine  (NICODERM CQ  - DOSED IN MG/24 HR) 7 mg/24hr patch Place 1 patch (7 mg total) onto the skin daily. Remove before bedtime. 28 patch 5   nicotine  polacrilex (NICORETTE ) 2 MG gum Take 1 each (2 mg total) by mouth as needed for smoking cessation. Use every 1-2 hours as needed. (Patient not taking: Reported on 10/17/2023) 100 tablet 5   ondansetron  (ZOFRAN ) 4 MG tablet Take 1 tablet (4 mg total) by mouth every 8 (eight) hours as needed for nausea or vomiting. (Patient not taking: Reported on 10/17/2023) 20 tablet 0   pantoprazole  (PROTONIX ) 40 MG tablet TAKE 1 TABLET BY MOUTH DAILY (AM) (Patient not taking: Reported on 10/17/2023) 30 tablet 3   pravastatin  (PRAVACHOL ) 20 MG tablet Take 1 tablet (20 mg total) by mouth daily. 90 tablet 1   SUMAtriptan  (IMITREX ) 50 MG tablet Take 1 tablet (50 mg total) by mouth every 2 (two) hours as needed for migraine. May repeat in 2 hours if headache persists or recurs. 10 tablet 0   traZODone  (DESYREL ) 100 MG tablet Take 1 tablet (100 mg total) by mouth at bedtime. (Patient not taking: Reported on 10/17/2023) 30 tablet 3   Current Facility-Administered Medications  Medication Dose Route Frequency Provider Last Rate Last Admin   0.9 %  sodium chloride  infusion  500 mL Intravenous Once Federico Rosario BROCKS, MD        Allergies as of 10/25/2023 - Review Complete 10/25/2023  Allergen Reaction Noted   Lisinopril  Swelling 10/02/2016   Trulicity  [dulaglutide ] Other (See Comments) 03/24/2021    Family History  Problem Relation Age of Onset   Diabetes Mother    Hypertension Mother    Renal Disease Mother    Colon cancer Father    Cancer Father    Diabetes Sister    Diabetes Sister    Diabetes Brother    Renal Disease Brother     Diabetes Brother    Diabetes Brother    Diabetes Brother    Diabetes Brother    Healthy Daughter    Healthy Daughter    Rectal cancer Neg Hx    Stomach cancer Neg Hx    Esophageal cancer Neg Hx     Social History   Socioeconomic History   Marital status: Single    Spouse name: Not on file   Number of children: Not on file   Years of education: Not on file   Highest education level: Not on file  Occupational History   Not on file  Tobacco Use   Smoking status: Every Day    Current packs/day: 0.25    Types: Cigarettes   Smokeless tobacco: Never   Tobacco comments:    Smoking 2-3 per day.  Trying to quit.  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  Vaping Use   Vaping status: Former  Substance and Sexual Activity   Alcohol use: No   Drug use: No   Sexual activity: Not on file  Other Topics Concern   Not on file  Social History Narrative   Right handed    Lives with daughter   Caffeine  use: Drinks coffee/tea/soda sometimes   Social Drivers of Corporate investment banker Strain: Low Risk  (06/30/2022)   Overall Financial Resource Strain (CARDIA)    Difficulty  of Paying Living Expenses: Not hard at all  Food Insecurity: No Food Insecurity (08/14/2023)   Hunger Vital Sign    Worried About Running Out of Food in the Last Year: Never true    Ran Out of Food in the Last Year: Never true  Transportation Needs: No Transportation Needs (04/20/2023)   PRAPARE - Administrator, Civil Service (Medical): No    Lack of Transportation (Non-Medical): No  Physical Activity: Sufficiently Active (06/30/2022)   Exercise Vital Sign    Days of Exercise per Week: 5 days    Minutes of Exercise per Session: 30 min  Stress: No Stress Concern Present (06/30/2022)   [employer] of Occupational Health - Occupational Stress Questionnaire    Feeling of Stress : Not at all  Social Connections: Socially Integrated (06/30/2022)   Social Connection and Isolation Panel    Frequency of Communication with  Friends and Family: More than three times a week    Frequency of Social Gatherings with Friends and Family: More than three times a week    Attends Religious Services: 1 to 4 times per year    Active Member of [employer] or Organizations: Yes    Attends Banker Meetings: 1 to 4 times per year    Marital Status: Living with partner  Intimate Partner Violence: Not At Risk (04/20/2023)   Humiliation, Afraid, Rape, and Kick questionnaire    Fear of Current or Ex-Partner: No    Emotionally Abused: No    Physically Abused: No    Sexually Abused: No    Physical Exam: Vital signs in last 24 hours: BP (!) 158/75   Pulse 85   Temp (!) 97.3 F (36.3 C)   Ht 5' 5 (1.651 m)   Wt 240 lb (108.9 kg)   SpO2 100%   BMI 39.94 kg/m  GEN: NAD EYE: Sclerae anicteric ENT: MMM CV: Non-tachycardic Pulm: No increased work of breathing GI: Soft, NT/ND NEURO:  Alert & Oriented   Estefana Kidney, MD [facility]  10/25/2023 9:51 AM

## 2023-10-25 NOTE — Patient Instructions (Addendum)
-  Handout on polyps, diverticulosis and hemorrhoids provided -Await pathology results  YOU HAD AN ENDOSCOPIC PROCEDURE TODAY AT THE [facility]:   Refer to the procedure report that was given to you for any specific questions about what was found during the examination.  If the procedure report does not answer your questions, please call your gastroenterologist to clarify.  If you requested that your care partner not be given the details of your procedure findings, then the procedure report has been included in a sealed envelope for you to review at your convenience later.  YOU SHOULD EXPECT: Some feelings of bloating in the abdomen. Passage of more gas than usual.  Walking can help get rid of the air that was put into your GI tract during the procedure and reduce the bloating. If you had a lower endoscopy (such as a colonoscopy or flexible sigmoidoscopy) you may notice spotting of blood in your stool or on the toilet paper. If you underwent a bowel prep for your procedure, you may not have a normal bowel movement for a few days.  Please Note:  You might notice some irritation and congestion in your nose or some drainage.  This is from the oxygen used during your procedure.  There is no need for concern and it should clear up in a day or so.  SYMPTOMS TO REPORT IMMEDIATELY:  Following lower endoscopy (colonoscopy or flexible sigmoidoscopy):  Excessive amounts of blood in the stool  Significant tenderness or worsening of abdominal pains  Swelling of the abdomen that is new, acute  Fever of 100F or higher  For urgent or emergent issues, a gastroenterologist can be reached at any hour by calling [phone]. Do not use MyChart messaging for urgent concerns.    DIET:  We do recommend a small meal at first, but then you may proceed to your regular diet.  Drink plenty of fluids but you should avoid alcoholic beverages for 24 hours.  ACTIVITY:  You should plan to take it easy for the  rest of today and you should NOT DRIVE or use heavy machinery until tomorrow (because of the sedation medicines used during the test).    FOLLOW UP: Our staff will call the number listed on your records the next business day following your procedure.  We will call around 7:15- 8:00 am to check on you and address any questions or concerns that you may have regarding the information given to you following your procedure. If we do not reach you, we will leave a message.     If any biopsies were taken you will be contacted by phone or by letter within the next 1-3 weeks.  Please call us  at [phone] if you have not heard about the biopsies in 3 weeks.    SIGNATURES/CONFIDENTIALITY: You and/or your care partner have signed paperwork which will be entered into your electronic medical record.  These signatures attest to the fact that that the information above on your After Visit Summary has been reviewed and is understood.  Full responsibility of the confidentiality of this discharge information lies with you and/or your care-partner.

## 2023-10-25 NOTE — Progress Notes (Signed)
 Called to room to assist during endoscopic procedure.  Patient ID and intended procedure confirmed with present staff. Received instructions for my participation in the procedure from the performing physician.

## 2023-10-26 ENCOUNTER — Telehealth: Payer: Self-pay

## 2023-10-26 NOTE — Telephone Encounter (Signed)
Attempted f/u call. VM left.

## 2023-10-29 ENCOUNTER — Ambulatory Visit: Payer: Self-pay | Admitting: Internal Medicine

## 2023-10-29 ENCOUNTER — Other Ambulatory Visit (INDEPENDENT_AMBULATORY_CARE_PROVIDER_SITE_OTHER): Payer: Self-pay | Admitting: Primary Care

## 2023-10-29 DIAGNOSIS — E119 Type 2 diabetes mellitus without complications: Secondary | ICD-10-CM

## 2023-10-29 LAB — SURGICAL PATHOLOGY

## 2023-10-30 NOTE — Telephone Encounter (Signed)
 Will forward to provider

## 2023-11-07 ENCOUNTER — Ambulatory Visit: Admitting: Orthopaedic Surgery

## 2023-11-07 ENCOUNTER — Other Ambulatory Visit (INDEPENDENT_AMBULATORY_CARE_PROVIDER_SITE_OTHER): Payer: Self-pay

## 2023-11-07 ENCOUNTER — Telehealth (HOSPITAL_COMMUNITY): Admitting: Psychiatry

## 2023-11-07 VITALS — Ht 66.0 in | Wt 235.4 lb

## 2023-11-07 DIAGNOSIS — M1711 Unilateral primary osteoarthritis, right knee: Secondary | ICD-10-CM

## 2023-11-07 DIAGNOSIS — M1712 Unilateral primary osteoarthritis, left knee: Secondary | ICD-10-CM

## 2023-11-07 DIAGNOSIS — E119 Type 2 diabetes mellitus without complications: Secondary | ICD-10-CM | POA: Diagnosis not present

## 2023-11-07 DIAGNOSIS — M170 Bilateral primary osteoarthritis of knee: Secondary | ICD-10-CM

## 2023-11-07 MED ORDER — DICLOFENAC SODIUM 75 MG PO TBEC
75.0000 mg | DELAYED_RELEASE_TABLET | Freq: Two times a day (BID) | ORAL | 2 refills | Status: DC
Start: 2023-11-07 — End: 2023-12-06

## 2023-11-07 MED ORDER — TRAMADOL HCL 50 MG PO TABS: 50.0000 mg | ORAL_TABLET | Freq: Every day | ORAL | 0 refills | Status: DC | PRN

## 2023-11-07 NOTE — Progress Notes (Signed)
 Office Visit Note   Patient: [name]           Date of Birth: 15-Apr-1970           MRN: [id number] Visit Date: 11/07/2023              Requested by: Celestia Rosaline SQUIBB, NP [address] [place],  [place] [address] PCP: Celestia Rosaline SQUIBB, NP   Assessment & Plan: Visit Diagnoses:  1. Primary osteoarthritis of right knee   2. Primary osteoarthritis of left knee   3. Type 2 diabetes mellitus without complication, without long-term current use of insulin  (HCC)     Plan: History of Present Illness [name] is a 53 year old female with rheumatoid arthritis and COPD who presents with chronic bilateral knee pain.  She experiences severe chronic bilateral knee pain that disrupts her sleep and limits her mobility. The pain is localized in the knee and radiates to the back of her calf. She has undergone meniscus surgery on her right knee. Previous cortisone injections did not provide relief.  Rheumatoid arthritis affects her joints, contributing to her knee pain. She has tried various medications for arthritis pain, including ibuprofen , diclofenac , and meloxicam, without success. She was previously on Celebrex  but discontinued it. Hydrocodone  has been used for pain management.  Her medical history includes diabetes with a recent A1c of 9.9 and hypertension. She occasionally smokes, which may exacerbate her COPD.  Physical Exam MUSCULOSKELETAL: Crepitus with movement of the knee.  Pain with ROM.  Normal strength and sensation.   Results LABS A1c: 9.9 (08/2021)  Assessment and Plan Severe right knee osteoarthritis Severe osteoarthritis confirmed by x-rays, causing significant pain. Surgery not feasible due to uncontrolled diabetes (A1c 9.9). - Prescribed diclofenac  for pain management. - Advised weight loss to reduce knee pressure in the meantime. - Recommended knee braces for support. - Delay knee replacement until A1c < 7.8.  Follow-Up Instructions: No  follow-ups on file.   Orders:  Orders Placed This Encounter  Procedures   XR KNEE 3 VIEW RIGHT   XR KNEE 3 VIEW LEFT   Meds ordered this encounter  Medications   diclofenac  (VOLTAREN ) 75 MG EC tablet    Sig: Take 1 tablet (75 mg total) by mouth 2 (two) times daily.    Dispense:  30 tablet    Refill:  2   traMADol  (ULTRAM ) 50 MG tablet    Sig: Take 1-2 tablets (50-100 mg total) by mouth daily as needed.    Dispense:  20 tablet    Refill:  0      Procedures: No procedures performed   Clinical Data: No additional findings.   Subjective: Chief Complaint  Patient presents with   Right Knee - Pain   Left Knee - Pain    HPI  Review of Systems  Constitutional: Negative.   HENT: Negative.    Eyes: Negative.   Respiratory: Negative.    Cardiovascular: Negative.   Endocrine: Negative.   Musculoskeletal: Negative.   Neurological: Negative.   Hematological: Negative.   Psychiatric/Behavioral: Negative.    All other systems reviewed and are negative.    Objective: Vital Signs: Ht 5' 6 (1.676 m)   Wt 235 lb 6.4 oz (106.8 kg)   BMI 37.99 kg/m   Physical Exam Vitals and nursing note reviewed.  Constitutional:      Appearance: She is well-developed.  HENT:     Head: Atraumatic.     Nose: Nose normal.  Eyes:     Extraocular Movements: Extraocular movements intact.  Cardiovascular:     Pulses: Normal pulses.  Pulmonary:     Effort: Pulmonary effort is normal.  Abdominal:     Palpations: Abdomen is soft.  Musculoskeletal:     Cervical back: Neck supple.  Skin:    General: Skin is warm.     Capillary Refill: Capillary refill takes less than 2 seconds.  Neurological:     Mental Status: She is alert. Mental status is at baseline.  Psychiatric:        Behavior: Behavior normal.        Thought Content: Thought content normal.        Judgment: Judgment normal.     Ortho Exam  Specialty Comments:  No specialty comments available.  Imaging: XR KNEE 3  VIEW RIGHT Result Date: 11/07/2023 X-rays demonstrate severe osteoarthritis.  Bone-on-bone joint space narrowing.  XR KNEE 3 VIEW LEFT Result Date: 11/07/2023 X-rays of the left knee show advanced tricompartmental degenerative joint disease.  Bone-on-bone joint space narrowing.    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   Hot flashes 10/02/2018   Vaginal dryness 10/02/2018   BIH (benign intracranial hypertension) 11/06/2017   Somnolence, daytime 07/30/2017   Snoring 07/30/2017   Morbid obesity (HCC) 07/30/2017   Migraine 10/02/2016   Common migraine with intractable migraine 10/02/2016   Type 2 diabetes mellitus without complication, without long-term current use of insulin  (HCC) 08/17/2016   Past Medical History:  Diagnosis Date   Anxiety    Asthma    Common migraine with intractable migraine 10/02/2016   Depression    Diabetes mellitus    Hyperlipidemia    Hypertension    Sleep apnea     Family History  Problem Relation Age of Onset   Diabetes Mother    Hypertension Mother    Renal Disease Mother    Colon cancer Father    Cancer Father    Diabetes Sister    Diabetes Sister    Diabetes Brother    Renal Disease Brother    Diabetes Brother    Diabetes Brother    Diabetes Brother    Diabetes Brother    Healthy Daughter    Healthy Daughter    Rectal cancer Neg Hx    Stomach cancer Neg Hx    Esophageal cancer Neg Hx     Past Surgical History:  Procedure Laterality Date   CHOLECYSTECTOMY     TUBAL LIGATION     Social History   Occupational History   Not on file  Tobacco Use   Smoking status: Every Day    Current packs/day: 0.25    Types: Cigarettes   Smokeless tobacco: Never    Tobacco comments:    Smoking 2-3 per day.  Trying to quit.  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  Vaping Use   Vaping status: Former  Substance and Sexual Activity   Alcohol use: No   Drug use: No   Sexual activity: Not on file

## 2023-11-09 ENCOUNTER — Encounter (HOSPITAL_COMMUNITY): Payer: Self-pay | Admitting: Family

## 2023-11-09 ENCOUNTER — Telehealth (INDEPENDENT_AMBULATORY_CARE_PROVIDER_SITE_OTHER): Admitting: Family

## 2023-11-09 DIAGNOSIS — F411 Generalized anxiety disorder: Secondary | ICD-10-CM

## 2023-11-09 DIAGNOSIS — F4329 Adjustment disorder with other symptoms: Secondary | ICD-10-CM | POA: Diagnosis not present

## 2023-11-09 DIAGNOSIS — F3341 Major depressive disorder, recurrent, in partial remission: Secondary | ICD-10-CM | POA: Diagnosis not present

## 2023-11-09 MED ORDER — ATOMOXETINE HCL 40 MG PO CAPS: 40.0000 mg | ORAL_CAPSULE | Freq: Every day | ORAL | 3 refills | Status: DC

## 2023-11-09 MED ORDER — DULOXETINE HCL 60 MG PO CPEP: ORAL_CAPSULE | ORAL | 3 refills | Status: DC

## 2023-11-09 MED ORDER — TRAZODONE HCL 100 MG PO TABS: 100.0000 mg | ORAL_TABLET | Freq: Every day | ORAL | 3 refills | Status: DC

## 2023-11-09 NOTE — Progress Notes (Signed)
 BH MD/PA/NP OP Progress Note Virtual Visit via Video Note  I connected with [name] on 11/09/23 at 11:00 AM EDT by a video enabled telemedicine application and verified that I am speaking with the correct person using two identifiers.  Location: Patient: Home Provider: Clinic   I discussed the limitations of evaluation and management by telemedicine and the availability of in person appointments. The patient expressed understanding and agreed to proceed.  I provided 30 minutes of non-face-to-face time during this encounter.    11/09/2023 12:31 PM [name]  MRN:  [id number]  Chief Complaint: My headaches are horrible  HPI: 53 year old female seen today for follow-up psychiatric evaluation.   She has a psychiatric history of tobacco dependence (in remission) depression and prolonged grief.  Currently she is managed on Strattera  40 mg daily, Nicoderm gum, and Nicorette  7 mg patches,trazodone  100 mg nightly hydroxyzine  10 mg three times daily as needed, and Cymbalta  60 mg twice daily.  She notes her medications are effective in managing her psychiatric conditions.  Patient was seen today for outpatient follow-up and presented as well-groomed, pleasant, cooperative, and engaged in conversation. She reported feeling somewhat tired due to caring for her 54- and 80-year-old grandchildren but otherwise described herself as being in a good place both physically and mentally.   The patient scored 0 on Gad-7 and phq-9.  She denied symptoms of depression or anxiety today and reported stable mood. She also denied suicidal ideation, homicidal ideation, visual hallucinations, mania, or paranoia.  She stated that she has been adherent to her medications, which remain effective in managing her psychiatric symptoms. No medication changes were made at today's visit. Supportive therapy was provided, and she expressed motivation to maintain her current level of functioning.   Visit Diagnosis:     ICD-10-CM   1. Generalized anxiety disorder  F41.1     2. MDD (major depressive disorder), recurrent, in partial remission (HCC)  F33.41       Past Psychiatric History:  MDD, grief with prolonged bereavement reaction, tobacco dependence  Past Medical History:  Past Medical History:  Diagnosis Date   Anxiety    Asthma    Common migraine with intractable migraine 10/02/2016   Depression    Diabetes mellitus    Hyperlipidemia    Hypertension    Sleep apnea     Past Surgical History:  Procedure Laterality Date   CHOLECYSTECTOMY     TUBAL LIGATION      Family Psychiatric History: Denied any family psychiatric history  Family History:  Family History  Problem Relation Age of Onset   Diabetes Mother    Hypertension Mother    Renal Disease Mother    Colon cancer Father    Cancer Father    Diabetes Sister    Diabetes Sister    Diabetes Brother    Renal Disease Brother    Diabetes Brother    Diabetes Brother    Diabetes Brother    Diabetes Brother    Healthy Daughter    Healthy Daughter    Rectal cancer Neg Hx    Stomach cancer Neg Hx    Esophageal cancer Neg Hx     Social History:  Social History   Socioeconomic History   Marital status: Single    Spouse name: Not on file   Number of children: Not on file   Years of education: Not on file   Highest education level: Not on file  Occupational History   Not on file  Tobacco Use   Smoking status: Every Day    Current packs/day: 0.25    Types: Cigarettes   Smokeless tobacco: Never   Tobacco comments:    Smoking 2-3 per day.  Trying to quit.  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  Vaping Use   Vaping status: Former  Substance and Sexual Activity   Alcohol use: No   Drug use: No   Sexual activity: Not on file  Other Topics Concern   Not on file  Social History Narrative   Right handed    Lives with daughter   Caffeine  use: Drinks coffee/tea/soda sometimes   Social Drivers of Corporate investment banker Strain: Low Risk   (06/30/2022)   Overall Financial Resource Strain (CARDIA)    Difficulty of Paying Living Expenses: Not hard at all  Food Insecurity: No Food Insecurity (08/14/2023)   Hunger Vital Sign    Worried About Running Out of Food in the Last Year: Never true    Ran Out of Food in the Last Year: Never true  Transportation Needs: No Transportation Needs (04/20/2023)   PRAPARE - Administrator, Civil Service (Medical): No    Lack of Transportation (Non-Medical): No  Physical Activity: Sufficiently Active (06/30/2022)   Exercise Vital Sign    Days of Exercise per Week: 5 days    Minutes of Exercise per Session: 30 min  Stress: No Stress Concern Present (06/30/2022)   [employer] of Occupational Health - Occupational Stress Questionnaire    Feeling of Stress : Not at all  Social Connections: Socially Integrated (06/30/2022)   Social Connection and Isolation Panel    Frequency of Communication with Friends and Family: More than three times a week    Frequency of Social Gatherings with Friends and Family: More than three times a week    Attends Religious Services: 1 to 4 times per year    Active Member of [employer] or Organizations: Yes    Attends Banker Meetings: 1 to 4 times per year    Marital Status: Living with partner    Allergies:  Allergies  Allergen Reactions   Lisinopril  Swelling    angioedema   Trulicity  [Dulaglutide ] Other (See Comments)    Gas, abdominal bloating    Metabolic Disorder Labs: Lab Results  Component Value Date   HGBA1C 9.9 (A) 08/14/2023   No results found for: PROLACTIN Lab Results  Component Value Date   CHOL 206 (H) 08/14/2023   TRIG 117 08/14/2023   HDL 48 08/14/2023   CHOLHDL 4.3 08/14/2023   LDLCALC 137 (H) 08/14/2023   LDLCALC 131 (H) 05/29/2022   Lab Results  Component Value Date   TSH 2.870 10/02/2018   TSH 4.840 (H) 01/09/2018    Therapeutic Level Labs: No results found for: LITHIUM No results found for:  VALPROATE No results found for: CBMZ  Current Medications: Current Outpatient Medications  Medication Sig Dispense Refill   Accu-Chek Softclix Lancets lancets Use to check blood sugar three times daily. 100 each 6   albuterol  (VENTOLIN  HFA) 108 (90 Base) MCG/ACT inhaler Inhale 1-2 puffs into the lungs every 6 (six) hours as needed for up to 14 days for wheezing or shortness of breath. 1 each 0   amLODipine  (NORVASC ) 10 MG tablet TAKE 1 TABLET BY MOUTH DAILY. (AM) 30 tablet 0   aspirin  EC 81 MG tablet Take 81 mg by mouth every 6 (six) hours as needed for mild pain.      blood glucose meter  kit and supplies Dispense based on patient and insurance preference. Use up to four times daily as directed. (FOR ICD-10 E10.9, E11.9). (Patient not taking: Reported on 10/25/2023) 1 each 0   Blood Glucose Monitoring Suppl (ACCU-CHEK GUIDE) w/Device KIT Use to check blood sugar three times daily. (Patient not taking: Reported on 10/25/2023) 1 kit 0   Butalbital -APAP-Caffeine  50-300-40 MG CAPS Take 1 capsule by mouth as needed.     cetirizine  (ZYRTEC ) 10 MG tablet Take 10 mg by mouth as needed. (Patient not taking: Reported on 10/25/2023)     Continuous Blood Gluc Receiver (FREESTYLE LIBRE 2 READER) DEVI Use to check blood sugar continuously throughout the day. Change sensors once every 14 days. E11.9 (Patient not taking: Reported on 10/25/2023) 1 each 0   Continuous Blood Gluc Sensor (FREESTYLE LIBRE 2 SENSOR) MISC Use to check blood sugar continuously throughout the day. Change sensors once every 14 days. E11.9 (Patient not taking: Reported on 10/25/2023) 2 each 3   diclofenac  (VOLTAREN ) 75 MG EC tablet Take 1 tablet (75 mg total) by mouth 2 (two) times daily. 30 tablet 2   dicyclomine  (BENTYL ) 20 MG tablet Take 1 tablet (20 mg total) by mouth 2 (two) times daily. (Patient not taking: Reported on 10/25/2023) 20 tablet 0   DULoxetine  (CYMBALTA ) 60 MG capsule TAKE 1 CAPSULE (60 MG TOTAL) BY MOUTH 2 (TWO) TIMES DAILY  (AM+BEDTIME) (Patient not taking: Reported on 10/25/2023) 90 capsule 3   escitalopram  (LEXAPRO ) 20 MG tablet Take 20 mg by mouth daily. (Patient not taking: Reported on 10/25/2023)     fluticasone  (FLONASE ) 50 MCG/ACT nasal spray PLACE 2 SPRAYS INTO BOTH NOSTRILS DAILY. (Patient not taking: Reported on 10/25/2023) 16 g 2   fluticasone  (FLOVENT  HFA) 44 MCG/ACT inhaler Inhale 2 puffs into the lungs 2 (two) times daily. (Patient not taking: Reported on 10/25/2023) 1 Inhaler 12   gabapentin  (NEURONTIN ) 300 MG capsule TAKE ONE CAPSULE BY MOUTH THREE TIMES DAILY ( AM+NOON+BEDTIME) (Patient not taking: No sig reported) 90 capsule 0   glucose blood (ACCU-CHEK GUIDE) test strip Use to check blood sugar three times daily. (Patient not taking: Reported on 10/25/2023) 100 each 6   hydrOXYzine  (ATARAX ) 10 MG tablet Take 1 tablet (10 mg total) by mouth 3 (three) times daily as needed. (Patient not taking: No sig reported) 90 tablet 3   insulin  glargine (LANTUS  SOLOSTAR) 100 UNIT/ML Solostar Pen Inject 56 Units into the skin daily. 45 mL 1   Insulin  Pen Needle (EASY COMFORT PEN NEEDLES) 31G X 8 MM MISC USE TO INJECT Lantus  once A DAY 100 each 2   lidocaine  (XYLOCAINE ) 5 % ointment Apply 1 Application topically 4 (four) times daily as needed. (Patient not taking: Reported on 10/25/2023) 35.44 g 0   losartan  (COZAAR ) 50 MG tablet TAKE 1 TABLET (50 MG TOTAL) BY MOUTH DAILY (AM) 90 tablet 1   metFORMIN  (GLUCOPHAGE ) 1000 MG tablet TAKE ONE TABLET BY MOUTH TWICE DAILY (AM+BEDTIME) 180 tablet 0   MOUNJARO  2.5 MG/0.5ML Pen INJECT 2.5 MG INTO THE SKIN ONCE A WEEK. 2 mL 1   naproxen  (NAPROSYN ) 500 MG tablet Take 1 tablet (500 mg total) by mouth 2 (two) times daily. (Patient not taking: No sig reported) 30 tablet 0   nicotine  (NICODERM CQ  - DOSED IN MG/24 HR) 7 mg/24hr patch Place 1 patch (7 mg total) onto the skin daily. Remove before bedtime. (Patient not taking: Reported on 10/25/2023) 28 patch 5   nicotine  polacrilex (NICORETTE )  2 MG gum Take  1 each (2 mg total) by mouth as needed for smoking cessation. Use every 1-2 hours as needed. (Patient not taking: No sig reported) 100 tablet 5   ondansetron  (ZOFRAN ) 4 MG tablet Take 1 tablet (4 mg total) by mouth every 8 (eight) hours as needed for nausea or vomiting. 20 tablet 0   pantoprazole  (PROTONIX ) 40 MG tablet TAKE 1 TABLET BY MOUTH DAILY (AM) (Patient not taking: Reported on 10/25/2023) 30 tablet 3   pravastatin  (PRAVACHOL ) 20 MG tablet Take 1 tablet (20 mg total) by mouth daily. (Patient not taking: Reported on 10/25/2023) 90 tablet 1   SUMAtriptan  (IMITREX ) 50 MG tablet Take 1 tablet (50 mg total) by mouth every 2 (two) hours as needed for migraine. May repeat in 2 hours if headache persists or recurs. (Patient not taking: Reported on 10/25/2023) 10 tablet 0   traMADol  (ULTRAM ) 50 MG tablet Take 1-2 tablets (50-100 mg total) by mouth daily as needed. 20 tablet 0   traZODone  (DESYREL ) 100 MG tablet Take 1 tablet (100 mg total) by mouth at bedtime. (Patient not taking: No sig reported) 30 tablet 3   No current facility-administered medications for this visit.     Musculoskeletal: Strength & Muscle Tone: within normal limits and telehealth visit Gait & Station: normal, telehealth visit Patient leans: N/A  Psychiatric Specialty Exam: Review of Systems  HENT:  Negative for trouble swallowing.   All other systems reviewed and are negative.   There were no vitals taken for this visit.There is no height or weight on file to calculate BMI.  General Appearance: Well Groomed  Eye Contact:  Good  Speech:  Clear and Coherent and Normal Rate  Volume:  Normal  Mood:  Euthymic,   Affect:  Appropriate and Congruent  Thought Process:  Coherent, Goal Directed, and Linear  Orientation:  Full (Time, Place, and Person)  Thought Content: WDL and Logical   Suicidal Thoughts:  No  Homicidal Thoughts:  No  Memory:  Immediate;   Good Recent;   Good Remote;   Good  Judgement:  Good   Insight:  Good  Psychomotor Activity:  Normal  Concentration:  Concentration: Good and Attention Span: Good  Recall:  Good  Fund of Knowledge: Good  Language: Good  Akathisia:  No  Handed:  Right  AIMS (if indicated): not done  Assets:  Communication Skills Desire for Improvement Financial Resources/Insurance Housing Leisure Time Physical Health Social Support  ADL's:  Intact  Cognition: WNL  Sleep:  Good   Screenings: GAD-7    Flowsheet Row Office Visit from 08/14/2023 in [facility] Video Visit from 05/25/2023 in [facility] Office Visit from 04/23/2023 in [facility] Visit from [facility]30[facility] Visit from 10/13/2022 in [facility]  Total GAD-7 Score 8 10 0 2 11   PHQ2-9    Flowsheet Row Office Visit from 08/14/2023 in [facility] Video Visit from 05/25/2023 in [facility] Office Visit from 04/23/2023 in [facility] Office Visit from 04/20/2023 in [facility] Video Visit from 03/30/2023 in [facility]  PHQ-2 Total Score 4 0 0 1 3  PHQ-9 Total Score 8 4 5  -- 10   Flowsheet Row UC from 10/16/2023 in [facility] ED from 07/21/2023 in [facility] Emergency Department at [facility] ED from 04/22/2023 in  [place] [facility]  C-SSRS RISK CATEGORY No Risk No Risk No Risk     Assessment and Plan: Patient reports that her physical and mental health has improved.  She informed Clinical research associate that her anxiety and depression are well-managed and notes that her sleep is no longer problematic with her CPAP.  No medication changes made today.  Patient agreeable to continue medications as prescribed.  1. Generalized anxiety disorder  Continue- DULoxetine  (CYMBALTA ) 60 MG  capsule; TAKE 1 CAPSULE (60 MG TOTAL) BY MOUTH 2 (TWO) TIMES DAILY (AM+BEDTIME)  Dispense: 90 capsule; Refill: 3 Continue- hydrOXYzine  (ATARAX ) 10 MG tablet; Take 1 tablet (10 mg total) by mouth 3 (three) times daily as needed.  Dispense: 90 tablet; Refill: 3  2. MDD (major depressive disorder), recurrent, in partial remission (HCC)  Continue- DULoxetine  (CYMBALTA ) 60 MG capsule; TAKE 1 CAPSULE (60 MG TOTAL) BY MOUTH 2 (TWO) TIMES DAILY (AM+BEDTIME)  Dispense: 90 capsule; Refill: 3 Continue- traZODone  (DESYREL ) 100 MG tablet; Take 1 tablet (100 mg total) by mouth at bedtime.  Dispense: 30 tablet; Refill: 3  3. Grief reaction with prolonged bereavement  Continue- DULoxetine  (CYMBALTA ) 60 MG capsule; TAKE 1 CAPSULE (60 MG TOTAL) BY MOUTH 2 (TWO) TIMES DAILY (AM+BEDTIME)  Dispense: 90 capsule; Refill: 3  4. Poor concentration  Continue- atomoxetine  (STRATTERA ) 40 MG capsule; Take 1 capsule (40 mg total) by mouth daily.  Dispense: 30 capsule; Refill: 3    Follow-up in 2 months Majel GORMAN Ramp, FNP 11/09/2023, 12:31 PM

## 2023-11-14 ENCOUNTER — Other Ambulatory Visit: Payer: Self-pay | Admitting: Orthopaedic Surgery

## 2023-11-14 DIAGNOSIS — G4733 Obstructive sleep apnea (adult) (pediatric): Secondary | ICD-10-CM | POA: Diagnosis not present

## 2023-11-15 ENCOUNTER — Other Ambulatory Visit: Payer: Self-pay | Admitting: Physician Assistant

## 2023-11-15 DIAGNOSIS — G4733 Obstructive sleep apnea (adult) (pediatric): Secondary | ICD-10-CM | POA: Diagnosis not present

## 2023-11-27 ENCOUNTER — Telehealth: Payer: Self-pay | Admitting: Orthopaedic Surgery

## 2023-11-27 NOTE — Telephone Encounter (Signed)
 Patient called. Would like a refill on Tramadol.

## 2023-11-27 NOTE — Telephone Encounter (Signed)
 Can you please advise since [name]/[name] is off please and thank you.

## 2023-11-28 MED ORDER — TRAMADOL HCL 50 MG PO TABS: 50.0000 mg | ORAL_TABLET | Freq: Every day | ORAL | 0 refills | Status: DC | PRN

## 2023-12-05 ENCOUNTER — Other Ambulatory Visit (INDEPENDENT_AMBULATORY_CARE_PROVIDER_SITE_OTHER): Payer: Self-pay | Admitting: Primary Care

## 2023-12-05 DIAGNOSIS — I10 Essential (primary) hypertension: Secondary | ICD-10-CM

## 2023-12-12 ENCOUNTER — Telehealth: Payer: Self-pay | Admitting: Orthopaedic Surgery

## 2023-12-12 NOTE — Telephone Encounter (Signed)
 Pt called requesting pain medication be sent to [facility]. Please call pt at [phone].

## 2023-12-13 ENCOUNTER — Other Ambulatory Visit: Payer: Self-pay | Admitting: Physician Assistant

## 2023-12-13 MED ORDER — TRAMADOL HCL 50 MG PO TABS: 50.0000 mg | ORAL_TABLET | Freq: Every day | ORAL | 0 refills | Status: DC | PRN

## 2023-12-13 NOTE — Telephone Encounter (Signed)
 Sent in tramadol

## 2023-12-15 DIAGNOSIS — G4733 Obstructive sleep apnea (adult) (pediatric): Secondary | ICD-10-CM | POA: Diagnosis not present

## 2023-12-20 ENCOUNTER — Encounter (HOSPITAL_COMMUNITY): Payer: Self-pay

## 2023-12-20 ENCOUNTER — Ambulatory Visit (HOSPITAL_COMMUNITY)
Admission: EM | Admit: 2023-12-20 | Discharge: 2023-12-20 | Disposition: A | Discharge status: Home / Self Care | Attending: Emergency Medicine | Admitting: Emergency Medicine

## 2023-12-20 ENCOUNTER — Ambulatory Visit (INDEPENDENT_AMBULATORY_CARE_PROVIDER_SITE_OTHER)

## 2023-12-20 DIAGNOSIS — M25561 Pain in right knee: Secondary | ICD-10-CM

## 2023-12-20 DIAGNOSIS — M1711 Unilateral primary osteoarthritis, right knee: Secondary | ICD-10-CM

## 2023-12-20 DIAGNOSIS — I10 Essential (primary) hypertension: Secondary | ICD-10-CM

## 2023-12-20 DIAGNOSIS — M25461 Effusion, right knee: Secondary | ICD-10-CM | POA: Diagnosis not present

## 2023-12-20 MED ORDER — DEXAMETHASONE SODIUM PHOSPHATE 10 MG/ML IJ SOLN
INTRAMUSCULAR | Status: AC
  Filled 2023-12-20: qty 1

## 2023-12-20 MED ORDER — DEXAMETHASONE SODIUM PHOSPHATE 10 MG/ML IJ SOLN
10.0000 mg | Freq: Once | INTRAMUSCULAR | Status: AC
  Administered 2023-12-20: 10 mg via INTRAMUSCULAR

## 2023-12-20 NOTE — ED Triage Notes (Signed)
 Pt present right knee pain, pt states on Sunday she was getting up off the toilet and twisted her knee.

## 2023-12-20 NOTE — ED Provider Notes (Signed)
 [facility]    CSN: [id number] Arrival date & time: 12/20/23  1421      History   Chief Complaint Chief Complaint  Patient presents with   Knee Pain    HPI [name] is a 53 y.o. female.   Patient presents with right knee pain and swelling.  Patient states that on 9/7 she was getting up off the toilet and her foot slipped and she twisted her right knee.  Patient states that she has had progressively worsening pain over the last few days.  Patient states that she is now having some difficulty bearing weight due to the pain.    Patient states that she already has a history of osteoarthritis to both of her knees, and states that she has been told that she has no ligaments in her right knee.  Patient denies any previous surgeries to her knee.  Patient denies any other injuries from this incident.  Patient denies actually falling and hitting the ground.  Patient states that she has been taking tramadol , ibuprofen , and BC powder with minimal relief.  Of note patient's blood pressure is elevated in clinic today.  Patient states that she has not taken her blood pressure medication today.  Patient states that she is not out of her blood pressure medication today, she just states that she did not take it this morning.  Denies any symptoms related to this.  The history is provided by the patient and medical records.  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   Hot flashes 10/02/2018   Vaginal  dryness 10/02/2018   BIH (benign intracranial hypertension) 11/06/2017   Somnolence, daytime 07/30/2017   Snoring 07/30/2017   Morbid obesity (HCC) 07/30/2017   Migraine 10/02/2016   Common migraine with intractable migraine 10/02/2016   Type 2 diabetes mellitus without complication, without long-term current use of insulin  (HCC) 08/17/2016    Past Surgical History:  Procedure Laterality Date   CHOLECYSTECTOMY     TUBAL LIGATION      OB History     Gravida  5   Para  3   Term  3   Preterm      AB  2   Living  3      SAB  1   IAB  1   Ectopic      Multiple      Live Births               Home Medications    Prior to Admission medications   Medication Sig Start Date End Date Taking? Authorizing Provider  Accu-Chek Softclix Lancets lancets Use to check blood sugar three times daily. 08/04/22   Newlin, Enobong, MD  albuterol  (VENTOLIN  HFA) 108 (90 Base) MCG/ACT inhaler Inhale 1-2 puffs into the lungs every 6 (six) hours as needed for up to 14 days for wheezing or shortness of breath. 06/07/21 10/17/23  Leath-Warren, Etta PARAS, NP  amLODipine  (NORVASC ) 10  MG tablet TAKE 1 TABLET BY MOUTH DAILY. (AM) 12/06/23   Celestia Rosaline SQUIBB, NP  aspirin  EC 81 MG tablet Take 81 mg by mouth every 6 (six) hours as needed for mild pain.     [provider]  atomoxetine  (STRATTERA ) 40 MG capsule Take 1 capsule (40 mg total) by mouth daily. 11/09/23   Wilkie Majel RAMAN, FNP  blood glucose meter kit and supplies Dispense based on patient and insurance preference. Use up to four times daily as directed. (FOR ICD-10 E10.9, E11.9). Patient not taking: Reported on 10/25/2023 01/05/20   Celestia Rosaline SQUIBB, NP  Blood Glucose Monitoring Suppl (ACCU-CHEK GUIDE) w/Device KIT Use to check blood sugar three times daily. Patient not taking: Reported on 10/25/2023 08/04/22   Newlin, Enobong, MD  Butalbital -APAP-Caffeine  50-300-40 MG CAPS Take 1 capsule by mouth as needed. 08/01/17    [provider]  celecoxib  (CELEBREX ) 200 MG capsule TAKE ONE CAPSULE BY MOUTH TWICE DAILY (AM+BEDTIME) 12/06/23   Celestia Rosaline SQUIBB, NP  cetirizine  (ZYRTEC ) 10 MG tablet Take 10 mg by mouth as needed. Patient not taking: Reported on 10/25/2023 11/07/16   [provider]  Continuous Blood Gluc Receiver (FREESTYLE LIBRE 2 READER) DEVI Use to check blood sugar continuously throughout the day. Change sensors once every 14 days. E11.9 Patient not taking: Reported on 10/25/2023 06/30/22   Newlin, Enobong, MD  Continuous Blood Gluc Sensor (FREESTYLE LIBRE 2 SENSOR) MISC Use to check blood sugar continuously throughout the day. Change sensors once every 14 days. E11.9 Patient not taking: Reported on 10/25/2023 06/30/22   Newlin, Enobong, MD  dicyclomine  (BENTYL ) 20 MG tablet Take 1 tablet (20 mg total) by mouth 2 (two) times daily. Patient not taking: Reported on 10/25/2023 03/14/23   Hildegard Loge, PA-C  DULoxetine  (CYMBALTA ) 60 MG capsule TAKE 1 CAPSULE (60 MG TOTAL) BY MOUTH 2 (TWO) TIMES DAILY (AM+BEDTIME) 11/09/23   Starkes-Perry, Majel RAMAN, FNP  escitalopram  (LEXAPRO ) 20 MG tablet Take 20 mg by mouth daily. Patient not taking: Reported on 10/25/2023 08/01/17   [provider]  fluticasone  (FLONASE ) 50 MCG/ACT nasal spray PLACE 2 SPRAYS INTO BOTH NOSTRILS DAILY. Patient not taking: Reported on 10/25/2023 10/17/22   Celestia Rosaline SQUIBB, NP  fluticasone  (FLOVENT  HFA) 44 MCG/ACT inhaler Inhale 2 puffs into the lungs 2 (two) times daily. Patient not taking: Reported on 10/25/2023 01/19/19   Celestia Rosaline SQUIBB, NP  gabapentin  (NEURONTIN ) 300 MG capsule TAKE ONE CAPSULE BY MOUTH THREE TIMES DAILY ( AM+NOON+BEDTIME) Patient not taking: No sig reported 01/23/23   Celestia Rosaline SQUIBB, NP  glucose blood (ACCU-CHEK GUIDE) test strip Use to check blood sugar three times daily. Patient not taking: Reported on 10/25/2023 08/04/22   Newlin, Enobong, MD  hydrOXYzine  (ATARAX ) 10 MG tablet Take 1 tablet (10  mg total) by mouth 3 (three) times daily as needed. Patient not taking: No sig reported 08/17/23   Harl Zane BRAVO, NP  insulin  glargine (LANTUS  SOLOSTAR) 100 UNIT/ML Solostar Pen Inject 56 Units into the skin daily. 08/14/23   Celestia Rosaline SQUIBB, NP  Insulin  Pen Needle (EASY COMFORT PEN NEEDLES) 31G X 8 MM MISC USE TO INJECT Lantus  once A DAY 10/23/23   Delbert Clam, MD  lidocaine  (XYLOCAINE ) 5 % ointment Apply 1 Application topically 4 (four) times daily as needed. Patient not taking: Reported on 10/25/2023 10/18/23   Joesph Shaver Scales, PA-C  losartan  (COZAAR ) 50 MG tablet TAKE 1 TABLET (50 MG TOTAL) BY MOUTH DAILY (AM) 08/14/23   Celestia Rosaline  P, NP  metFORMIN  (GLUCOPHAGE ) 1000 MG tablet TAKE ONE TABLET BY MOUTH TWICE DAILY (AM+BEDTIME) 09/10/23   Celestia Rosaline SQUIBB, NP  MOUNJARO  2.5 MG/0.5ML Pen INJECT 2.5 MG INTO THE SKIN ONCE A WEEK. 11/04/23   Celestia Rosaline SQUIBB, NP  nicotine  (NICODERM CQ  - DOSED IN MG/24 HR) 7 mg/24hr patch Place 1 patch (7 mg total) onto the skin daily. Remove before bedtime. Patient not taking: Reported on 10/25/2023 08/17/23   Harl Zane BRAVO, NP  nicotine  polacrilex (NICORETTE ) 2 MG gum Take 1 each (2 mg total) by mouth as needed for smoking cessation. Use every 1-2 hours as needed. Patient not taking: No sig reported 08/17/23   Harl Zane BRAVO, NP  ondansetron  (ZOFRAN ) 4 MG tablet Take 1 tablet (4 mg total) by mouth every 8 (eight) hours as needed for nausea or vomiting. 02/15/21   Newlin, Enobong, MD  pantoprazole  (PROTONIX ) 40 MG tablet TAKE 1 TABLET BY MOUTH DAILY (AM) Patient not taking: Reported on 10/25/2023 09/10/23   Celestia Rosaline SQUIBB, NP  pravastatin  (PRAVACHOL ) 20 MG tablet Take 1 tablet (20 mg total) by mouth daily. Patient not taking: Reported on 10/25/2023 08/18/23   Celestia Rosaline SQUIBB, NP  SUMAtriptan  (IMITREX ) 50 MG tablet Take 1 tablet (50 mg total) by mouth every 2 (two) hours as needed for migraine. May repeat in 2 hours if headache persists or  recurs. Patient not taking: Reported on 10/25/2023 06/11/23   Celestia Rosaline SQUIBB, NP  traMADol  (ULTRAM ) 50 MG tablet Take 1-2 tablets (50-100 mg total) by mouth daily as needed. 12/13/23   Jule Ronal CROME, PA-C  traZODone  (DESYREL ) 100 MG tablet Take 1 tablet (100 mg total) by mouth at bedtime. 11/09/23   Starkes-Perry, Majel RAMAN, FNP    Family History Family History  Problem Relation Age of Onset   Diabetes Mother    Hypertension Mother    Renal Disease Mother    Colon cancer Father    Cancer Father    Diabetes Sister    Diabetes Sister    Diabetes Brother    Renal Disease Brother    Diabetes Brother    Diabetes Brother    Diabetes Brother    Diabetes Brother    Healthy Daughter    Healthy Daughter    Rectal cancer Neg Hx    Stomach cancer Neg Hx    Esophageal cancer Neg Hx     Social History Social History   Tobacco Use   Smoking status: Every Day    Current packs/day: 0.25    Types: Cigarettes   Smokeless tobacco: Never   Tobacco comments:    Smoking 2-3 per day.  Trying to quit.  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  Vaping Use   Vaping status: Former  Substance Use Topics   Alcohol use: No   Drug use: No     Allergies   Lisinopril  and Trulicity  [dulaglutide ]   Review of Systems Review of Systems  Per HPI  Physical Exam Triage Vital Signs ED Triage Vitals  Encounter Vitals Group     BP 12/20/23 1454 (!) 160/106     Girls Systolic BP Percentile --      Girls Diastolic BP Percentile --      Boys Systolic BP Percentile --      Boys Diastolic BP Percentile --      Pulse Rate 12/20/23 1454 79     Resp 12/20/23 1454 18     Temp 12/20/23 1454 (!) 97.4 F (36.3 C)  Temp Source 12/20/23 1454 Oral     SpO2 12/20/23 1454 95 %     Weight --      Height --      Head Circumference --      Peak Flow --      Pain Score 12/20/23 1453 10     Pain Loc --      Pain Education --      Exclude from Growth Chart --    No data found.  Updated Vital Signs BP (!) 160/106 (BP  Location: Left Arm)   Pulse 79   Temp (!) 97.4 F (36.3 C) (Oral)   Resp 18   SpO2 95%   Visual Acuity Right Eye Distance:   Left Eye Distance:   Bilateral Distance:    Right Eye Near:   Left Eye Near:    Bilateral Near:     Physical Exam Vitals and nursing note reviewed.  Constitutional:      General: She is awake. She is not in acute distress.    Appearance: Normal appearance. She is well-developed and well-groomed. She is not ill-appearing.  Musculoskeletal:     Right knee: Swelling present. No deformity, effusion or erythema. Decreased range of motion. Tenderness present over the medial joint line and lateral joint line.     Comments: Swelling noted to generalized knee.  Mildly decreased range of motion secondary to pain.  Tenderness noted over medial and lateral joint line.  Skin:    General: Skin is warm and dry.  Neurological:     Mental Status: She is alert.  Psychiatric:        Behavior: Behavior is cooperative.      UC Treatments / Results  Labs (all labs ordered are listed, but only abnormal results are displayed) Labs Reviewed - No data to display  EKG   Radiology DG Knee Complete 4 Views Right Result Date: 12/20/2023 EXAM: 4 or more VIEW(S) XRAY OF THE RIGHT KNEE 12/20/2023 03:12:32 PM COMPARISON: 11/07/2023 CLINICAL HISTORY: Knee pain, slipped and twisted at the knee. Patient presents with right knee pain, states on Sunday she was getting up off the toilet and twisted her knee. FINDINGS: BONES AND JOINTS: No fracture or dislocation. Progressive narrowing of the articular cartilage in the lateral compartment. There is a resultant mild valgus deformity. Tricompartmental marginal spurring. SOFT TISSUES: Small effusion in the suprapatellar bursa. IMPRESSION: 1. No acute fracture or dislocation. 2. Progressive narrowing of the articular cartilage in the lateral compartment with tricompartmental marginal spurring, mild valgus deformity. 3. Small effusion in the  suprapatellar bursa. Electronically signed by: Katheleen Faes MD 12/20/2023 03:26 PM EDT RP Workstation: [id number]    Procedures Procedures (including critical care time)  Medications Ordered in UC Medications  dexamethasone  (DECADRON ) injection 10 mg (10 mg Intramuscular Given 12/20/23 1547)    Initial Impression / Assessment and Plan / UC Course  I have reviewed the triage vital signs and the nursing notes.  Pertinent labs & imaging results that were available during my care of the patient were reviewed by me and considered in my medical decision making (see chart for details).     Patient is overall well-appearing, but does appear to be in pain today.  Patient is in a wheelchair during exam due to worsening pain with bearing weight.  X-ray ordered.  Based on interpretation there is no acute fracture or dislocation.  There does appear to be worsening osteoarthritis on exam.  Radiology report does confirm this and reveals small  effusion in the suprapatella bursa.  Given one-time dose of Decadron  in clinic due to most recent hemoglobin A1c being elevated at 9.9.  Recommended Tylenol  ibuprofen  as needed for additional pain relief and to help reduce swelling.  Recommended taking previously prescribed tramadol  as needed for severe pain.  Recommended continuing with knee brace and applying ice periodically throughout the day to help decrease swelling.  Discussed importance taking blood pressure medication daily.  Discussed follow-up and return precautions. Final Clinical Impressions(s) / UC Diagnoses   Final diagnoses:  Acute pain of right knee  Elevated blood pressure reading in office with diagnosis of hypertension  Primary osteoarthritis of right knee     Discharge Instructions      You are given an injection of Decadron  in clinic today to help with inflammation causing your pain. You can alternate between 500 mg of Tylenol  and 400 to 600 mg of ibuprofen  every 6-8 hours as needed  for pain. You can also continue to take your tramadol  needed. Continue to wear knee brace to provide compression to help reduce swelling. Apply ice periodically throughout the day to help decrease swelling as well. Keep your appointment with your orthopedic doctor on 9/16 for further evaluation and management of this. Otherwise follow-up with your primary care provider or return here as needed. Please be sure to take your blood pressure medication daily as prescribed.     ED Prescriptions   None    PDMP not reviewed this encounter.   Johnie Flaming A, NP 12/20/23 [phone]

## 2023-12-20 NOTE — Discharge Instructions (Addendum)
 You are given an injection of Decadron  in clinic today to help with inflammation causing your pain. You can alternate between 500 mg of Tylenol  and 400 to 600 mg of ibuprofen  every 6-8 hours as needed for pain. You can also continue to take your tramadol  needed. Continue to wear knee brace to provide compression to help reduce swelling. Apply ice periodically throughout the day to help decrease swelling as well. Keep your appointment with your orthopedic doctor on 9/16 for further evaluation and management of this. Otherwise follow-up with your primary care provider or return here as needed. Please be sure to take your blood pressure medication daily as prescribed.

## 2023-12-25 ENCOUNTER — Ambulatory Visit: Payer: Self-pay | Admitting: *Deleted

## 2023-12-25 ENCOUNTER — Ambulatory Visit: Payer: Self-pay

## 2023-12-25 ENCOUNTER — Ambulatory Visit (INDEPENDENT_AMBULATORY_CARE_PROVIDER_SITE_OTHER): Admitting: Physician Assistant

## 2023-12-25 ENCOUNTER — Other Ambulatory Visit (INDEPENDENT_AMBULATORY_CARE_PROVIDER_SITE_OTHER): Payer: Self-pay | Admitting: Primary Care

## 2023-12-25 DIAGNOSIS — M170 Bilateral primary osteoarthritis of knee: Secondary | ICD-10-CM

## 2023-12-25 DIAGNOSIS — G43019 Migraine without aura, intractable, without status migrainosus: Secondary | ICD-10-CM

## 2023-12-25 LAB — POCT GLYCOSYLATED HEMOGLOBIN (HGB A1C): Hemoglobin A1C: 11.4 % — AB (ref 4.0–5.6)

## 2023-12-25 MED ORDER — ACETAMINOPHEN-CODEINE 300-30 MG PO TABS: 1.0000 | ORAL_TABLET | Freq: Two times a day (BID) | ORAL | 1 refills | Status: AC | PRN

## 2023-12-25 NOTE — Telephone Encounter (Signed)
 Duplicate encounter, opened in error.

## 2023-12-25 NOTE — Telephone Encounter (Signed)
 FYI Only or Action Required?: Action required by provider: request for appointment, medication refill request, and update on patient condition.  Patient was last seen in primary care on 10/23/2023 by Celestia Rosaline SQUIBB, NP.  Called Nurse Triage reporting Migraine.  Symptoms began today.  Interventions attempted: Rest, hydration, or home remedies and Other: requesting medication refill imitrex .  Symptoms are: gradually worsening.  Triage Disposition: See HCP Within 4 Hours (Or PCP Triage)  Patient/caregiver understands and will follow disposition?: Unsure   Patient seen today for injection in knee and could not get due to A1C 11.5%. patient reports she does not have any way to check blood glucose for NT. Multiple sx reported . Recommended UC for evaluation unsure if patient will go .  CAL aware.             Copied from CRM [id number]. Topic: Clinical - Red Word Triage >> Dec 25, 2023  3:07 PM Mia F wrote: Red Word that prompted transfer to Nurse Triage: Pt says she went to get her cortisone injection in her knee and was told she cannot get it because her A1C was a 11.5%. She said she does not have any symptoms but she does get hot flashes sometimes and has a migraine right now. She said this has been going on since she been taking MOUNJARO  2.5 MG/0.5ML Pen Reason for Disposition  [1] Vomiting AND [2] 2 or more times  (Exception: Similar to previously diagnosed migraines.)  Answer Assessment - Initial Assessment Questions Recommended UC or ED to check blood glucose and evaluate migraine sx. Initial call to refill migraine medication. Unsure if patient will go to UC or ED to get evaluated for sx. No available appt with other providers today or tomorrow.   Please advise : medication refill- imitrex                             Order for new glucose meter                     1. LOCATION: Where does it hurt?      Front  2. ONSET: When did the headache start? (e.g., minutes,  hours, days)      On going since starting mounjaro  3. PATTERN: Does the pain come and go, or has it been constant since it started?    All of the time 4. SEVERITY: How bad is the pain? and What does it keep you from doing?  (e.g., Scale 1-10; mild, moderate, or severe)     Did not rate 5. RECURRENT SYMPTOM: Have you ever had headaches before? If Yes, ask: When was the last time? and What happened that time?      Yes  6. CAUSE: What do you think is causing the headache?     mounjaro  7. MIGRAINE: Have you been diagnosed with migraine headaches? If Yes, ask: Is this headache similar?      Yes  8. HEAD INJURY: Has there been any recent injury to your head?      Na  9. OTHER SYMPTOMS: Do you have any other symptoms? (e.g., fever, stiff neck, eye pain, sore throat, cold symptoms)     Migraines, constipation, hot flashes at night and during day. Incontinence noted at night. Needs to wear depends , vomiting 3-4 times today. Reports elevated A1c 11.5% today at ortho  app. Patient does not have meter to check blood sugar now. Dizziness, frontal headache.  Out of medication imitrex  for migraines. 10. PREGNANCY: Is there any chance you are pregnant? When was your last menstrual period?       na  Protocols used: Headache-A-AH

## 2023-12-25 NOTE — Progress Notes (Signed)
 Office Visit Note   Patient: [name]           Date of Birth: 1970/06/28           MRN: [id number] Visit Date: 12/25/2023              Requested by: Celestia Rosaline SQUIBB, NP [address] [place],  [place] [address] PCP: Celestia Rosaline SQUIBB, NP   Assessment & Plan: Visit Diagnoses:  1. Bilateral primary osteoarthritis of knee     Plan: Impression is advanced tricompartmental degenerative changes both knees right more more symptomatic than left.  We have discussed various treatment options to include repeat cortisone injection versus total knee replacement surgery.  We drew a hemoglobin A1c at today's visit, and unfortunately it was 11.4.  For this reason, I am unable to proceed with cortisone injection.  She will need to obtain a hemoglobin A1c of less than 7.8 to be able to proceed with total knee replacement surgery.  I have sent in a prescription for Tylenol  3 and I have recommended reaching out to her PCP for better management of her diabetes.  She will follow-up with us  as needed.  Follow-Up Instructions: Return if symptoms worsen or fail to improve.   Orders:  Orders Placed This Encounter  Procedures   POCT HgB A1C   Meds ordered this encounter  Medications   acetaminophen -codeine  (TYLENOL  #3) 300-30 MG tablet    Sig: Take 1 tablet by mouth 2 (two) times daily as needed.    Dispense:  30 tablet    Refill:  1      Procedures: No procedures performed   Clinical Data: No additional findings.   Subjective: Chief Complaint  Patient presents with   Left Knee - Pain   Right Knee - Pain    HPI patient is a pleasant 53 year old female who comes in today with bilateral knee pain right greater than left.  History of osteoarthritis to both knees.  About 3 weeks ago, she slipped getting off the toilet and twisted her right knee.  She has had significantly worse pain since.  Pain is worse with walking.  She does appear to be having mechanical symptoms as  well.  She has tried tramadol  without relief.  Review of Systems as detailed in HPI.  All others reviewed and are negative.   Objective: Vital Signs: There were no vitals taken for this visit.  Physical Exam well-developed well-nourished female in no acute distress.  Alert and oriented x 3.  Ortho Exam bilateral knee exam: Valgus deformity.  Marked tenderness lateral joint line on the right medial joint line on the left.  She is neurovascularly intact distally.  Specialty Comments:  No specialty comments available.  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   Hot flashes 10/02/2018   Vaginal dryness 10/02/2018   BIH (benign intracranial hypertension) 11/06/2017   Somnolence, daytime 07/30/2017   Snoring 07/30/2017   Morbid obesity (HCC) 07/30/2017   Migraine 10/02/2016   Common migraine with intractable migraine 10/02/2016   Type 2 diabetes mellitus without complication, without long-term current use of insulin  (HCC) 08/17/2016   Past Medical History:  Diagnosis Date   Anxiety  Asthma    Common migraine with intractable migraine 10/02/2016   Depression    Diabetes mellitus    Hyperlipidemia    Hypertension    Sleep apnea     Family History  Problem Relation Age of Onset   Diabetes Mother    Hypertension Mother    Renal Disease Mother    Colon cancer Father    Cancer Father    Diabetes Sister    Diabetes Sister    Diabetes Brother    Renal Disease Brother    Diabetes Brother    Diabetes Brother    Diabetes Brother    Diabetes Brother    Healthy Daughter    Healthy Daughter    Rectal cancer Neg Hx    Stomach cancer Neg Hx    Esophageal cancer Neg Hx      Past Surgical History:  Procedure Laterality Date   CHOLECYSTECTOMY     TUBAL LIGATION     Social History   Occupational History   Not on file  Tobacco Use   Smoking status: Every Day    Current packs/day: 0.25    Types: Cigarettes   Smokeless tobacco: Never   Tobacco comments:    Smoking 2-3 per day.  Trying to quit.  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  Vaping Use   Vaping status: Former  Substance and Sexual Activity   Alcohol use: No   Drug use: No   Sexual activity: Not on file

## 2023-12-25 NOTE — Telephone Encounter (Signed)
 Copied from CRM [id number]. Topic: Clinical - Red Word Triage >> Dec 25, 2023  3:07 PM Mia F wrote: Red Word that prompted transfer to Nurse Triage: Pt says she went to get her cortisone injection in her knee and was told she cannot get it because her A1C was a 11.5%. She said she does not have any symptoms but she does get hot flashes sometimes and has a migraine right now. She said this has been going on since she been taking MOUNJARO  2.5 MG/0.5ML Pen >> Dec 25, 2023  4:38 PM Tobias L wrote: Patient inquiring if medication was sent in. Advised no medication sent in but message was routed to provider and staff. Patient verbalized understanding.

## 2023-12-26 NOTE — Telephone Encounter (Signed)
 noted

## 2023-12-31 ENCOUNTER — Other Ambulatory Visit (INDEPENDENT_AMBULATORY_CARE_PROVIDER_SITE_OTHER): Payer: Self-pay | Admitting: Primary Care

## 2023-12-31 DIAGNOSIS — E119 Type 2 diabetes mellitus without complications: Secondary | ICD-10-CM

## 2023-12-31 DIAGNOSIS — M2550 Pain in unspecified joint: Secondary | ICD-10-CM

## 2024-01-09 ENCOUNTER — Ambulatory Visit (HOSPITAL_COMMUNITY)
Admission: EM | Admit: 2024-01-09 | Discharge: 2024-01-09 | Disposition: A | Discharge status: Home / Self Care | Attending: Emergency Medicine | Admitting: Emergency Medicine

## 2024-01-09 ENCOUNTER — Encounter (HOSPITAL_COMMUNITY): Payer: Self-pay

## 2024-01-09 DIAGNOSIS — B9789 Other viral agents as the cause of diseases classified elsewhere: Secondary | ICD-10-CM | POA: Diagnosis not present

## 2024-01-09 DIAGNOSIS — J988 Other specified respiratory disorders: Secondary | ICD-10-CM | POA: Diagnosis not present

## 2024-01-09 DIAGNOSIS — R051 Acute cough: Secondary | ICD-10-CM | POA: Diagnosis not present

## 2024-01-09 DIAGNOSIS — B370 Candidal stomatitis: Secondary | ICD-10-CM | POA: Diagnosis not present

## 2024-01-09 LAB — POC COVID19/FLU A&B COMBO
Covid Antigen, POC: NEGATIVE
Influenza A Antigen, POC: NEGATIVE
Influenza B Antigen, POC: NEGATIVE

## 2024-01-09 LAB — POCT RAPID STREP A (OFFICE): Rapid Strep A Screen: NEGATIVE

## 2024-01-09 MED ORDER — NYSTATIN 100000 UNIT/ML MT SUSP: 5.0000 mL | Freq: Four times a day (QID) | OROMUCOSAL | 0 refills | Status: AC

## 2024-01-09 MED ORDER — PROMETHAZINE-DM 6.25-15 MG/5ML PO SYRP: 5.0000 mL | ORAL_SOLUTION | Freq: Every evening | ORAL | 0 refills | Status: AC | PRN

## 2024-01-09 MED ORDER — BENZONATATE 100 MG PO CAPS: 100.0000 mg | ORAL_CAPSULE | Freq: Three times a day (TID) | ORAL | 0 refills | Status: AC

## 2024-01-09 NOTE — Discharge Instructions (Addendum)
 Your COVID, flu, and strep test were all negative in clinic today.  I believe your symptoms are likely related to a viral respiratory illness. You can take Tessalon  every 8 hours as needed for cough.  You can also take Promethazine  DM cough syrup at bedtime as needed for cough.  This can make you drowsy so do not drive, work, or drink alcohol while taking this. Otherwise alternate between 500 to 1000 mg of Tylenol  and 400 to 600 mg of ibuprofen  every 6-8 hours as needed for sore throat and fever. You can also take over-the-counter Mucinex  to help with cough and congestion. I also prescribed Magic mouthwash which will cover for oral thrush but also provide relief of mouth pain and throat pain that you can use 4 times daily. Make sure you are staying hydrated and getting plenty of rest. Follow-up with your primary care provider or return here as needed.

## 2024-01-09 NOTE — ED Triage Notes (Signed)
 Pt states sore throat, cough and fever for the past 3 days.States her tongue is raw. States she was taking Tylenol  cold and flu at home.

## 2024-01-09 NOTE — ED Provider Notes (Signed)
 [facility]    CSN: [id number] Arrival date & time: 01/09/24  1700      History   Chief Complaint Chief Complaint  Patient presents with   Sore Throat    HPI [name] is a 53 y.o. female.   Patient presents with sore throat, cough, fever, body aches, chills, and intermittent diarrhea over the last 3 days.  Patient states that she recently traveled to [place]  and then began to have symptoms.  Patient denies shortness of breath, chest pain, nausea, vomiting.  Patient states that she has been taking Tylenol  Cold and flu with minimal relief.  Patient also reports tongue pain and states that it feels like her tongue is raw and cut open.  Patient states that this began around the same time as well.  Patient denies any bleeding or discharge from her tongue.  The history is provided by the patient and medical records.  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   Hot flashes 10/02/2018   Vaginal dryness 10/02/2018   BIH (benign intracranial hypertension) 11/06/2017   Somnolence, daytime 07/30/2017   Snoring 07/30/2017   Morbid obesity (HCC) 07/30/2017   Migraine 10/02/2016   Common migraine with intractable migraine 10/02/2016   Type 2 diabetes mellitus without complication, without long-term current use of insulin  (HCC) 08/17/2016    Past Surgical History:  Procedure Laterality Date   CHOLECYSTECTOMY     TUBAL LIGATION      OB History     Gravida  5   Para  3   Term  3    Preterm      AB  2   Living  3      SAB  1   IAB  1   Ectopic      Multiple      Live Births               Home Medications    Prior to Admission medications   Medication Sig Start Date End Date Taking? Authorizing Provider  benzonatate  (TESSALON ) 100 MG capsule Take 1 capsule (100 mg total) by mouth every 8 (eight) hours. 01/09/24  Yes Johnie Flaming A, NP  magic mouthwash (nystatin , lidocaine , diphenhydrAMINE , alum & mag hydroxide) suspension Swish and spit 5 mLs 4 (four) times daily. 01/09/24  Yes Johnie, Ediel Unangst A, NP  promethazine -dextromethorphan  (PROMETHAZINE -DM) 6.25-15 MG/5ML syrup Take 5 mLs by mouth at bedtime as needed for cough. 01/09/24  Yes Johnie Flaming A, NP  Accu-Chek Softclix Lancets lancets Use to check blood sugar three times daily. 08/04/22   Newlin, Enobong, MD  acetaminophen -codeine  (TYLENOL  #3) 300-30 MG tablet Take 1 tablet by mouth 2 (two) times daily as needed. 12/25/23   Jule Ronal CROME, PA-C  albuterol  (VENTOLIN  HFA) 108 (90 Base) MCG/ACT inhaler Inhale 1-2 puffs into the lungs every 6 (six) hours as needed for up to 14 days for wheezing or shortness of breath. 06/07/21 10/17/23  Leath-Warren, Etta PARAS,  NP  amLODipine  (NORVASC ) 10 MG tablet TAKE 1 TABLET BY MOUTH DAILY. (AM) 12/06/23   Celestia Rosaline SQUIBB, NP  aspirin  EC 81 MG tablet Take 81 mg by mouth every 6 (six) hours as needed for mild pain.     [provider]  atomoxetine  (STRATTERA ) 40 MG capsule Take 1 capsule (40 mg total) by mouth daily. 11/09/23   Wilkie Majel RAMAN, FNP  blood glucose meter kit and supplies Dispense based on patient and insurance preference. Use up to four times daily as directed. (FOR ICD-10 E10.9, E11.9). Patient not taking: Reported on 10/25/2023 01/05/20   Celestia Rosaline SQUIBB, NP  Blood Glucose Monitoring Suppl (ACCU-CHEK GUIDE) w/Device KIT Use to check blood sugar three times daily. Patient not taking: Reported on 10/25/2023 08/04/22   Newlin, Enobong,  MD  Butalbital -APAP-Caffeine  50-300-40 MG CAPS Take 1 capsule by mouth as needed. 08/01/17   [provider]  celecoxib  (CELEBREX ) 200 MG capsule TAKE ONE CAPSULE BY MOUTH TWICE DAILY (AM+BEDTIME) 12/31/23   Celestia Rosaline SQUIBB, NP  cetirizine  (ZYRTEC ) 10 MG tablet Take 10 mg by mouth as needed. Patient not taking: Reported on 10/25/2023 11/07/16   [provider]  Continuous Blood Gluc Receiver (FREESTYLE LIBRE 2 READER) DEVI Use to check blood sugar continuously throughout the day. Change sensors once every 14 days. E11.9 Patient not taking: Reported on 10/25/2023 06/30/22   Newlin, Enobong, MD  Continuous Blood Gluc Sensor (FREESTYLE LIBRE 2 SENSOR) MISC Use to check blood sugar continuously throughout the day. Change sensors once every 14 days. E11.9 Patient not taking: Reported on 10/25/2023 06/30/22   Newlin, Enobong, MD  dicyclomine  (BENTYL ) 20 MG tablet Take 1 tablet (20 mg total) by mouth 2 (two) times daily. Patient not taking: Reported on 10/25/2023 03/14/23   Hildegard Loge, PA-C  DULoxetine  (CYMBALTA ) 60 MG capsule TAKE 1 CAPSULE (60 MG TOTAL) BY MOUTH 2 (TWO) TIMES DAILY (AM+BEDTIME) 11/09/23   Starkes-Perry, Majel RAMAN, FNP  escitalopram  (LEXAPRO ) 20 MG tablet Take 20 mg by mouth daily. Patient not taking: Reported on 10/25/2023 08/01/17   [provider]  fluticasone  (FLONASE ) 50 MCG/ACT nasal spray PLACE 2 SPRAYS INTO BOTH NOSTRILS DAILY. Patient not taking: Reported on 10/25/2023 10/17/22   Celestia Rosaline SQUIBB, NP  fluticasone  (FLOVENT  HFA) 44 MCG/ACT inhaler Inhale 2 puffs into the lungs 2 (two) times daily. Patient not taking: Reported on 10/25/2023 01/19/19   Celestia Rosaline SQUIBB, NP  gabapentin  (NEURONTIN ) 300 MG capsule TAKE ONE CAPSULE BY MOUTH THREE TIMES DAILY ( AM+NOON+BEDTIME) Patient not taking: No sig reported 01/23/23   Celestia Rosaline SQUIBB, NP  glucose blood (ACCU-CHEK GUIDE) test strip Use to check blood sugar three times daily. Patient not taking: Reported on  10/25/2023 08/04/22   Newlin, Enobong, MD  hydrOXYzine  (ATARAX ) 10 MG tablet Take 1 tablet (10 mg total) by mouth 3 (three) times daily as needed. Patient not taking: No sig reported 08/17/23   Harl Zane BRAVO, NP  insulin  glargine (LANTUS  SOLOSTAR) 100 UNIT/ML Solostar Pen Inject 56 Units into the skin daily. 08/14/23   Celestia Rosaline SQUIBB, NP  Insulin  Pen Needle (EASY COMFORT PEN NEEDLES) 31G X 8 MM MISC USE TO INJECT Lantus  once A DAY 10/23/23   Delbert Clam, MD  lidocaine  (XYLOCAINE ) 5 % ointment Apply 1 Application topically 4 (four) times daily as needed. Patient not taking: Reported on 10/25/2023 10/18/23   Joesph Shaver Scales, PA-C  losartan  (COZAAR ) 50 MG tablet TAKE 1 TABLET (50 MG TOTAL) BY MOUTH DAILY (AM)  08/14/23   Celestia Rosaline SQUIBB, NP  metFORMIN  (GLUCOPHAGE ) 1000 MG tablet TAKE ONE TABLET BY MOUTH TWICE DAILY (AM+BEDTIME) 12/31/23   Celestia Rosaline SQUIBB, NP  MOUNJARO  2.5 MG/0.5ML Pen INJECT 2.5 MG INTO THE SKIN ONCE A WEEK. 11/04/23   Celestia Rosaline SQUIBB, NP  nicotine  (NICODERM CQ  - DOSED IN MG/24 HR) 7 mg/24hr patch Place 1 patch (7 mg total) onto the skin daily. Remove before bedtime. Patient not taking: Reported on 10/25/2023 08/17/23   Harl Zane BRAVO, NP  nicotine  polacrilex (NICORETTE ) 2 MG gum Take 1 each (2 mg total) by mouth as needed for smoking cessation. Use every 1-2 hours as needed. Patient not taking: No sig reported 08/17/23   Harl Zane E, NP  ondansetron  (ZOFRAN ) 4 MG tablet Take 1 tablet (4 mg total) by mouth every 8 (eight) hours as needed for nausea or vomiting. 02/15/21   Newlin, Enobong, MD  pantoprazole  (PROTONIX ) 40 MG tablet TAKE 1 TABLET BY MOUTH DAILY (AM) Patient not taking: Reported on 10/25/2023 09/10/23   Celestia Rosaline SQUIBB, NP  pravastatin  (PRAVACHOL ) 20 MG tablet Take 1 tablet (20 mg total) by mouth daily. Patient not taking: Reported on 10/25/2023 08/18/23   Celestia Rosaline SQUIBB, NP  SUMAtriptan  (IMITREX ) 50 MG tablet TAKE 1 TABLET (50 MG TOTAL) BY  MOUTH EVERY 2 (TWO) HOURS AS NEEDED FOR MIGRAINE. MAY REPEAT IN 2 HOURS IF HEADACHE PERSISTS OR RECURS. 12/26/23   Celestia Rosaline SQUIBB, NP  traMADol  (ULTRAM ) 50 MG tablet Take 1-2 tablets (50-100 mg total) by mouth daily as needed. 12/13/23   Jule Ronal CROME, PA-C  traZODone  (DESYREL ) 100 MG tablet Take 1 tablet (100 mg total) by mouth at bedtime. 11/09/23   Starkes-Perry, Majel RAMAN, FNP    Family History Family History  Problem Relation Age of Onset   Diabetes Mother    Hypertension Mother    Renal Disease Mother    Colon cancer Father    Cancer Father    Diabetes Sister    Diabetes Sister    Diabetes Brother    Renal Disease Brother    Diabetes Brother    Diabetes Brother    Diabetes Brother    Diabetes Brother    Healthy Daughter    Healthy Daughter    Rectal cancer Neg Hx    Stomach cancer Neg Hx    Esophageal cancer Neg Hx     Social History Social History   Tobacco Use   Smoking status: Every Day    Current packs/day: 0.25    Types: Cigarettes   Smokeless tobacco: Never   Tobacco comments:    Smoking 2-3 per day.  Trying to quit.  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  Vaping Use   Vaping status: Former  Substance Use Topics   Alcohol use: No   Drug use: No     Allergies   Lisinopril  and Trulicity  [dulaglutide ]   Review of Systems Review of Systems  Per HPI  Physical Exam Triage Vital Signs ED Triage Vitals  Encounter Vitals Group     BP 01/09/24 1819 (!) 141/80     Girls Systolic BP Percentile --      Girls Diastolic BP Percentile --      Boys Systolic BP Percentile --      Boys Diastolic BP Percentile --      Pulse Rate 01/09/24 1819 97     Resp 01/09/24 1819 16     Temp 01/09/24 1819 98 F (36.7 C)     Temp  Source 01/09/24 1819 Oral     SpO2 01/09/24 1819 93 %     Weight --      Height --      Head Circumference --      Peak Flow --      Pain Score 01/09/24 1817 10     Pain Loc --      Pain Education --      Exclude from Growth Chart --    No data  found.  Updated Vital Signs BP (!) 141/80 (BP Location: Left Arm)   Pulse 97   Temp 98 F (36.7 C) (Oral)   Resp 16   SpO2 93%   Visual Acuity Right Eye Distance:   Left Eye Distance:   Bilateral Distance:    Right Eye Near:   Left Eye Near:    Bilateral Near:     Physical Exam Vitals and nursing note reviewed.  Constitutional:      General: She is awake. She is not in acute distress.    Appearance: Normal appearance. She is well-developed and well-groomed. She is not ill-appearing.  HENT:     Right Ear: Tympanic membrane, ear canal and external ear normal.     Left Ear: Tympanic membrane, ear canal and external ear normal.     Nose: Congestion and rhinorrhea present.     Mouth/Throat:     Mouth: Mucous membranes are moist.     Pharynx: Posterior oropharyngeal erythema and postnasal drip present. No oropharyngeal exudate.     Comments: White patchiness noted to tongue that appears to be consistent with presence of oral thrush Cardiovascular:     Rate and Rhythm: Normal rate and regular rhythm.  Pulmonary:     Effort: Pulmonary effort is normal.     Breath sounds: Normal breath sounds.  Skin:    General: Skin is warm and dry.  Neurological:     Mental Status: She is alert.  Psychiatric:        Behavior: Behavior is cooperative.      UC Treatments / Results  Labs (all labs ordered are listed, but only abnormal results are displayed) Labs Reviewed  POCT RAPID STREP A (OFFICE) - Normal  POC COVID19/FLU A&B COMBO - Normal    EKG   Radiology No results found.  Procedures Procedures (including critical care time)  Medications Ordered in UC Medications - No data to display  Initial Impression / Assessment and Plan / UC Course  I have reviewed the triage vital signs and the nursing notes.  Pertinent labs & imaging results that were available during my care of the patient were reviewed by me and considered in my medical decision making (see chart for  details).     Patient is overall well-appearing.  Vitals are stable.  Congestion and rhinorrhea are present, erythema and PND noted to posterior oropharynx.  Lungs clear bilaterally to auscultation.  Rapid strep, COVID, and flu testing were all negative.  Symptoms likely viral in nature.  Prescribed Tessalon  and Promethazine  DM as needed for cough.  Discussed over-the-counter medications as needed for symptoms.  Exam findings also consistent with presence of oral thrush.  Prescribed Magic mouthwash for coverage of this as well as pain relief related to this.  Discussed follow-up and return precautions. Final Clinical Impressions(s) / UC Diagnoses   Final diagnoses:  Acute cough  Viral respiratory illness  Oral thrush     Discharge Instructions      Your COVID, flu, and strep test  were all negative in clinic today.  I believe your symptoms are likely related to a viral respiratory illness. You can take Tessalon  every 8 hours as needed for cough.  You can also take Promethazine  DM cough syrup at bedtime as needed for cough.  This can make you drowsy so do not drive, work, or drink alcohol while taking this. Otherwise alternate between 500 to 1000 mg of Tylenol  and 400 to 600 mg of ibuprofen  every 6-8 hours as needed for sore throat and fever. You can also take over-the-counter Mucinex  to help with cough and congestion. I also prescribed Magic mouthwash which will cover for oral thrush but also provide relief of mouth pain and throat pain that you can use 4 times daily. Make sure you are staying hydrated and getting plenty of rest. Follow-up with your primary care provider or return here as needed.   ED Prescriptions     Medication Sig Dispense Auth. Provider   benzonatate  (TESSALON ) 100 MG capsule Take 1 capsule (100 mg total) by mouth every 8 (eight) hours. 21 capsule Johnie Flaming A, NP   promethazine -dextromethorphan  (PROMETHAZINE -DM) 6.25-15 MG/5ML syrup Take 5 mLs by mouth at  bedtime as needed for cough. 118 mL Johnie Flaming A, NP   magic mouthwash (nystatin , lidocaine , diphenhydrAMINE , alum & mag hydroxide) suspension Swish and spit 5 mLs 4 (four) times daily. 180 mL Johnie Flaming A, NP      PDMP not reviewed this encounter.   Johnie Flaming A, NP 01/09/24 [phone]

## 2024-01-24 ENCOUNTER — Ambulatory Visit (INDEPENDENT_AMBULATORY_CARE_PROVIDER_SITE_OTHER): Admitting: Primary Care

## 2024-01-25 ENCOUNTER — Other Ambulatory Visit: Payer: Self-pay | Admitting: Physician Assistant

## 2024-01-25 ENCOUNTER — Telehealth: Payer: Self-pay | Admitting: Physician Assistant

## 2024-01-25 MED ORDER — TRAMADOL HCL 50 MG PO TABS: 50.0000 mg | ORAL_TABLET | Freq: Every day | ORAL | 0 refills | Status: DC | PRN

## 2024-01-25 NOTE — Telephone Encounter (Signed)
 Patient called and said she needs a refill on pain medication but not the recent ones she had but the one from before. [phone]

## 2024-01-25 NOTE — Telephone Encounter (Signed)
Sent tramadol

## 2024-01-26 ENCOUNTER — Emergency Department (HOSPITAL_COMMUNITY)
Admission: EM | Admit: 2024-01-26 | Discharge: 2024-01-26 | Disposition: A | Discharge status: Home / Self Care | Attending: Emergency Medicine | Admitting: Emergency Medicine

## 2024-01-26 ENCOUNTER — Encounter (HOSPITAL_COMMUNITY): Payer: Self-pay

## 2024-01-26 ENCOUNTER — Emergency Department (HOSPITAL_COMMUNITY)

## 2024-01-26 ENCOUNTER — Other Ambulatory Visit: Payer: Self-pay

## 2024-01-26 DIAGNOSIS — E119 Type 2 diabetes mellitus without complications: Secondary | ICD-10-CM | POA: Diagnosis not present

## 2024-01-26 DIAGNOSIS — R079 Chest pain, unspecified: Secondary | ICD-10-CM | POA: Diagnosis not present

## 2024-01-26 DIAGNOSIS — Z7982 Long term (current) use of aspirin: Secondary | ICD-10-CM | POA: Diagnosis not present

## 2024-01-26 DIAGNOSIS — E860 Dehydration: Secondary | ICD-10-CM | POA: Insufficient documentation

## 2024-01-26 DIAGNOSIS — Z720 Tobacco use: Secondary | ICD-10-CM | POA: Diagnosis not present

## 2024-01-26 DIAGNOSIS — R930 Abnormal findings on diagnostic imaging of skull and head, not elsewhere classified: Secondary | ICD-10-CM | POA: Diagnosis not present

## 2024-01-26 DIAGNOSIS — Z79899 Other long term (current) drug therapy: Secondary | ICD-10-CM | POA: Diagnosis not present

## 2024-01-26 DIAGNOSIS — I10 Essential (primary) hypertension: Secondary | ICD-10-CM | POA: Diagnosis not present

## 2024-01-26 DIAGNOSIS — R55 Syncope and collapse: Secondary | ICD-10-CM | POA: Diagnosis not present

## 2024-01-26 DIAGNOSIS — Z794 Long term (current) use of insulin: Secondary | ICD-10-CM | POA: Diagnosis not present

## 2024-01-26 DIAGNOSIS — G4489 Other headache syndrome: Secondary | ICD-10-CM | POA: Diagnosis not present

## 2024-01-26 DIAGNOSIS — D72829 Elevated white blood cell count, unspecified: Secondary | ICD-10-CM | POA: Diagnosis not present

## 2024-01-26 DIAGNOSIS — R519 Headache, unspecified: Secondary | ICD-10-CM | POA: Diagnosis not present

## 2024-01-26 LAB — COMPREHENSIVE METABOLIC PANEL WITH GFR
ALT: 47 U/L — ABNORMAL HIGH (ref 0–44)
AST: 33 U/L (ref 15–41)
Albumin: 3.8 g/dL (ref 3.5–5.0)
Alkaline Phosphatase: 158 U/L — ABNORMAL HIGH (ref 38–126)
Anion gap: 12 (ref 5–15)
BUN: 12 mg/dL (ref 6–20)
CO2: 22 mmol/L (ref 22–32)
Calcium: 9.4 mg/dL (ref 8.9–10.3)
Chloride: 106 mmol/L (ref 98–111)
Creatinine, Ser: 0.8 mg/dL (ref 0.44–1.00)
GFR, Estimated: >60 mL/min (ref >60)
Glucose, Bld: 260 mg/dL — ABNORMAL HIGH (ref 70–99)
Potassium: 4.7 mmol/L (ref 3.5–5.1)
Sodium: 139 mmol/L (ref 135–145)
Total Bilirubin: 0.3 mg/dL (ref 0.0–1.2)
Total Protein: 6.8 g/dL (ref 6.5–8.1)

## 2024-01-26 LAB — TROPONIN T, HIGH SENSITIVITY
Troponin T High Sensitivity: <15 ng/L (ref 0–19)
Troponin T High Sensitivity: <15 ng/L (ref 0–19)

## 2024-01-26 LAB — RESP PANEL BY RT-PCR (RSV, FLU A&B, COVID)  RVPGX2
Influenza A by PCR: NEGATIVE
Influenza B by PCR: NEGATIVE
Resp Syncytial Virus by PCR: NEGATIVE
SARS Coronavirus 2 by RT PCR: NEGATIVE

## 2024-01-26 LAB — CBC WITH DIFFERENTIAL/PLATELET
Abs Immature Granulocytes: 0.07 K/uL (ref 0.00–0.07)
Basophils Absolute: 0 K/uL (ref 0.0–0.1)
Basophils Relative: 0 %
Eosinophils Absolute: 0.1 K/uL (ref 0.0–0.5)
Eosinophils Relative: 1 %
HCT: 43.7 % (ref 36.0–46.0)
Hemoglobin: 14.1 g/dL (ref 12.0–15.0)
Immature Granulocytes: 1 %
Lymphocytes Relative: 20 %
Lymphs Abs: 2.8 K/uL (ref 0.7–4.0)
MCH: 24.7 pg — ABNORMAL LOW (ref 26.0–34.0)
MCHC: 32.3 g/dL (ref 30.0–36.0)
MCV: 76.4 fL — ABNORMAL LOW (ref 80.0–100.0)
Monocytes Absolute: 0.8 K/uL (ref 0.1–1.0)
Monocytes Relative: 5 %
Neutro Abs: 10.4 K/uL — ABNORMAL HIGH (ref 1.7–7.7)
Neutrophils Relative %: 73 %
Platelets: 254 K/uL (ref 150–400)
RBC: 5.72 MIL/uL — ABNORMAL HIGH (ref 3.87–5.11)
RDW: 14.1 % (ref 11.5–15.5)
WBC: 14.1 K/uL — ABNORMAL HIGH (ref 4.0–10.5)
nRBC: 0 % (ref 0.0–0.2)

## 2024-01-26 MED ORDER — KETOROLAC TROMETHAMINE 15 MG/ML IJ SOLN
15.0000 mg | Freq: Once | INTRAMUSCULAR | Status: AC
  Administered 2024-01-26: 15 mg via INTRAVENOUS
  Filled 2024-01-26: qty 1

## 2024-01-26 MED ORDER — SODIUM CHLORIDE 0.9 % IV BOLUS
1000.0000 mL | Freq: Once | INTRAVENOUS | Status: AC
  Administered 2024-01-26: 1000 mL via INTRAVENOUS

## 2024-01-26 MED ORDER — METOCLOPRAMIDE HCL 5 MG/ML IJ SOLN
10.0000 mg | Freq: Once | INTRAMUSCULAR | Status: AC
  Administered 2024-01-26: 10 mg via INTRAVENOUS
  Filled 2024-01-26: qty 2

## 2024-01-26 NOTE — ED Provider Notes (Signed)
 [place] EMERGENCY DEPARTMENT AT [facility] Provider Note   CSN: [id number] Arrival date & time: 01/26/24  1513     Patient presents with: Loss of Consciousness   [name] is a 53 y.o. female.  Patient with past history significant for type 2 diabetes, migraine disorder, morbid obesity, tobacco use, and benign intracranial hypertension presents the emergency department concerns of syncope.  Reportedly had syncopal episode while at home after she attempted to stand began to feel lightheaded.  Patient was initially hypotensive with EMS with a blood pressure of 74/50.  Patient denies any recent vomiting or diarrhea but does report that she had 1 episode of vomiting in between her episodes of syncope today.  Denies any feelings of chest pain or shortness of breath at this time but does report that after she came back to, she had a brief episode of chest pain.   Loss of Consciousness      Prior to Admission medications   Medication Sig Start Date End Date Taking? Authorizing Provider  Accu-Chek Softclix Lancets lancets Use to check blood sugar three times daily. 08/04/22   Newlin, Enobong, MD  acetaminophen -codeine  (TYLENOL  #3) 300-30 MG tablet Take 1 tablet by mouth 2 (two) times daily as needed. 12/25/23   Jule Ronal CROME, PA-C  albuterol  (VENTOLIN  HFA) 108 (90 Base) MCG/ACT inhaler Inhale 1-2 puffs into the lungs every 6 (six) hours as needed for up to 14 days for wheezing or shortness of breath. 06/07/21 10/17/23  Leath-Warren, Etta PARAS, NP  amLODipine  (NORVASC ) 10 MG tablet TAKE 1 TABLET BY MOUTH DAILY. (AM) 12/06/23   Celestia Rosaline SQUIBB, NP  aspirin  EC 81 MG tablet Take 81 mg by mouth every 6 (six) hours as needed for mild pain.     [provider]  atomoxetine  (STRATTERA ) 40 MG capsule Take 1 capsule (40 mg total) by mouth daily. 11/09/23   Starkes-Perry, Majel RAMAN, FNP  benzonatate  (TESSALON ) 100 MG capsule Take 1 capsule (100 mg total) by mouth every 8 (eight)  hours. 01/09/24   Johnie Rumaldo LABOR, NP  blood glucose meter kit and supplies Dispense based on patient and insurance preference. Use up to four times daily as directed. (FOR ICD-10 E10.9, E11.9). Patient not taking: Reported on 10/25/2023 01/05/20   Celestia Rosaline SQUIBB, NP  Blood Glucose Monitoring Suppl (ACCU-CHEK GUIDE) w/Device KIT Use to check blood sugar three times daily. Patient not taking: Reported on 10/25/2023 08/04/22   Newlin, Enobong, MD  Butalbital -APAP-Caffeine  50-300-40 MG CAPS Take 1 capsule by mouth as needed. 08/01/17   [provider]  celecoxib  (CELEBREX ) 200 MG capsule TAKE ONE CAPSULE BY MOUTH TWICE DAILY (AM+BEDTIME) 12/31/23   Celestia Rosaline SQUIBB, NP  cetirizine  (ZYRTEC ) 10 MG tablet Take 10 mg by mouth as needed. Patient not taking: Reported on 10/25/2023 11/07/16   [provider]  Continuous Blood Gluc Receiver (FREESTYLE LIBRE 2 READER) DEVI Use to check blood sugar continuously throughout the day. Change sensors once every 14 days. E11.9 Patient not taking: Reported on 10/25/2023 06/30/22   Newlin, Enobong, MD  Continuous Blood Gluc Sensor (FREESTYLE LIBRE 2 SENSOR) MISC Use to check blood sugar continuously throughout the day. Change sensors once every 14 days. E11.9 Patient not taking: Reported on 10/25/2023 06/30/22   Newlin, Enobong, MD  dicyclomine  (BENTYL ) 20 MG tablet Take 1 tablet (20 mg total) by mouth 2 (two) times daily. Patient not taking: Reported on 10/25/2023 03/14/23   Hildegard Loge, PA-C  DULoxetine  (CYMBALTA ) 60 MG capsule  TAKE 1 CAPSULE (60 MG TOTAL) BY MOUTH 2 (TWO) TIMES DAILY (AM+BEDTIME) 11/09/23   Starkes-Perry, Majel RAMAN, FNP  escitalopram  (LEXAPRO ) 20 MG tablet Take 20 mg by mouth daily. Patient not taking: Reported on 10/25/2023 08/01/17   [provider]  fluticasone  (FLONASE ) 50 MCG/ACT nasal spray PLACE 2 SPRAYS INTO BOTH NOSTRILS DAILY. Patient not taking: Reported on 10/25/2023 10/17/22   Celestia Rosaline SQUIBB, NP  fluticasone  (FLOVENT   HFA) 44 MCG/ACT inhaler Inhale 2 puffs into the lungs 2 (two) times daily. Patient not taking: Reported on 10/25/2023 01/19/19   Celestia Rosaline SQUIBB, NP  gabapentin  (NEURONTIN ) 300 MG capsule TAKE ONE CAPSULE BY MOUTH THREE TIMES DAILY ( AM+NOON+BEDTIME) Patient not taking: No sig reported 01/23/23   Celestia Rosaline SQUIBB, NP  glucose blood (ACCU-CHEK GUIDE) test strip Use to check blood sugar three times daily. Patient not taking: Reported on 10/25/2023 08/04/22   Newlin, Enobong, MD  hydrOXYzine  (ATARAX ) 10 MG tablet Take 1 tablet (10 mg total) by mouth 3 (three) times daily as needed. Patient not taking: No sig reported 08/17/23   Harl Regan E, NP  insulin  glargine (LANTUS  SOLOSTAR) 100 UNIT/ML Solostar Pen Inject 56 Units into the skin daily. 08/14/23   Celestia Rosaline SQUIBB, NP  Insulin  Pen Needle (EASY COMFORT PEN NEEDLES) 31G X 8 MM MISC USE TO INJECT Lantus  once A DAY 10/23/23   Delbert Clam, MD  lidocaine  (XYLOCAINE ) 5 % ointment Apply 1 Application topically 4 (four) times daily as needed. Patient not taking: Reported on 10/25/2023 10/18/23   Joesph Shaver Scales, PA-C  losartan  (COZAAR ) 50 MG tablet TAKE 1 TABLET (50 MG TOTAL) BY MOUTH DAILY (AM) 08/14/23   Celestia Rosaline SQUIBB, NP  magic mouthwash (nystatin , lidocaine , diphenhydrAMINE , alum & mag hydroxide) suspension Swish and spit 5 mLs 4 (four) times daily. 01/09/24   Johnie Flaming A, NP  metFORMIN  (GLUCOPHAGE ) 1000 MG tablet TAKE ONE TABLET BY MOUTH TWICE DAILY (AM+BEDTIME) 12/31/23   Celestia Rosaline SQUIBB, NP  MOUNJARO  2.5 MG/0.5ML Pen INJECT 2.5 MG INTO THE SKIN ONCE A WEEK. 11/04/23   Celestia Rosaline SQUIBB, NP  nicotine  (NICODERM CQ  - DOSED IN MG/24 HR) 7 mg/24hr patch Place 1 patch (7 mg total) onto the skin daily. Remove before bedtime. Patient not taking: Reported on 10/25/2023 08/17/23   Harl Regan BRAVO, NP  nicotine  polacrilex (NICORETTE ) 2 MG gum Take 1 each (2 mg total) by mouth as needed for smoking cessation. Use every 1-2 hours  as needed. Patient not taking: No sig reported 08/17/23   Harl Regan E, NP  ondansetron  (ZOFRAN ) 4 MG tablet Take 1 tablet (4 mg total) by mouth every 8 (eight) hours as needed for nausea or vomiting. 02/15/21   Newlin, Enobong, MD  pantoprazole  (PROTONIX ) 40 MG tablet TAKE 1 TABLET BY MOUTH DAILY (AM) Patient not taking: Reported on 10/25/2023 09/10/23   Celestia Rosaline SQUIBB, NP  pravastatin  (PRAVACHOL ) 20 MG tablet Take 1 tablet (20 mg total) by mouth daily. Patient not taking: Reported on 10/25/2023 08/18/23   Celestia Rosaline SQUIBB, NP  promethazine -dextromethorphan  (PROMETHAZINE -DM) 6.25-15 MG/5ML syrup Take 5 mLs by mouth at bedtime as needed for cough. 01/09/24   Johnie Flaming A, NP  SUMAtriptan  (IMITREX ) 50 MG tablet TAKE 1 TABLET (50 MG TOTAL) BY MOUTH EVERY 2 (TWO) HOURS AS NEEDED FOR MIGRAINE. MAY REPEAT IN 2 HOURS IF HEADACHE PERSISTS OR RECURS. 12/26/23   Celestia Rosaline SQUIBB, NP  traMADol  (ULTRAM ) 50 MG tablet Take 1-2 tablets (50-100 mg total) by mouth  daily as needed. 01/25/24   Jule Ronal CROME, PA-C  traZODone  (DESYREL ) 100 MG tablet Take 1 tablet (100 mg total) by mouth at bedtime. 11/09/23   Wilkie Majel RAMAN, FNP    Allergies: Lisinopril  and Trulicity  [dulaglutide ]    Review of Systems  Cardiovascular:  Positive for syncope.  Neurological:  Positive for syncope.  All other systems reviewed and are negative.   Updated Vital Signs BP (!) 116/53   Pulse 74   Temp 97.6 F (36.4 C) (Oral)   Resp 17   Ht 5' 6 (1.676 m)   Wt 106.8 kg   SpO2 97%   BMI 38.00 kg/m   Physical Exam Vitals and nursing note reviewed.  Constitutional:      General: She is not in acute distress.    Appearance: Normal appearance. She is well-developed.     Comments: Fatigued but easily arousable  HENT:     Head: Normocephalic and atraumatic.  Eyes:     Conjunctiva/sclera: Conjunctivae normal.  Cardiovascular:     Rate and Rhythm: Normal rate and regular rhythm.     Heart sounds: No  murmur heard. Pulmonary:     Effort: Pulmonary effort is normal. No respiratory distress.     Breath sounds: Normal breath sounds.  Abdominal:     Palpations: Abdomen is soft.     Tenderness: There is no abdominal tenderness.  Musculoskeletal:        General: No swelling.     Cervical back: Neck supple.  Skin:    General: Skin is warm and dry.     Capillary Refill: Capillary refill takes less than 2 seconds.  Neurological:     General: No focal deficit present.     Mental Status: She is alert and oriented to person, place, and time.     Motor: No weakness.     Gait: Gait normal.  Psychiatric:        Mood and Affect: Mood normal.     (all labs ordered are listed, but only abnormal results are displayed) Labs Reviewed  CBC WITH DIFFERENTIAL/PLATELET - Abnormal; Notable for the following components:      Result Value   WBC 14.1 (*)    RBC 5.72 (*)    MCV 76.4 (*)    MCH 24.7 (*)    Neutro Abs 10.4 (*)    All other components within normal limits  COMPREHENSIVE METABOLIC PANEL WITH GFR - Abnormal; Notable for the following components:   Glucose, Bld 260 (*)    ALT 47 (*)    Alkaline Phosphatase 158 (*)    All other components within normal limits  RESP PANEL BY RT-PCR (RSV, FLU A&B, COVID)  [id number]  TROPONIN T, HIGH SENSITIVITY  TROPONIN T, HIGH SENSITIVITY    EKG: None  Radiology: CT Head Wo Contrast Result Date: 01/26/2024 CLINICAL DATA:  Headache, increasing frequency or severity Syncope/presyncope, cerebrovascular cause suspected EXAM: CT HEAD WITHOUT CONTRAST TECHNIQUE: Contiguous axial images were obtained from the base of the skull through the vertex without intravenous contrast. RADIATION DOSE REDUCTION: This exam was performed according to the departmental dose-optimization program which includes automated exposure control, adjustment of the mA and/or kV according to patient size and/or use of iterative reconstruction technique. COMPARISON:  Head CT 05/09/2019  FINDINGS: Brain: No intracranial hemorrhage, mass effect, or midline shift. No hydrocephalus. The basilar cisterns are patent. No evidence of territorial infarct or acute ischemia. No extra-axial or intracranial fluid collection. Stable enlarged partially empty sella. Vascular: No  hyperdense vessel or unexpected calcification. Skull: No fracture or focal lesion. Sinuses/Orbits: No acute finding. Other: None. IMPRESSION: No acute intracranial abnormality or explanation for headache. Electronically Signed   By: Andrea Gasman M.D.   On: 01/26/2024 17:29   DG Chest Portable 1 View Result Date: 01/26/2024 CLINICAL DATA:  Syncope and collapse. EXAM: PORTABLE CHEST 1 VIEW COMPARISON:  04/22/2023. FINDINGS: The heart size and mediastinal contours are within normal limits. Mild subsegmental atelectasis or scarring is noted in the mid left lung. No consolidation, effusion, or pneumothorax is seen. No acute osseous abnormality. IMPRESSION: No active disease. Electronically Signed   By: Leita Birmingham M.D.   On: 01/26/2024 16:56     Procedures   Medications Ordered in the ED  sodium chloride  0.9 % bolus 1,000 mL (1,000 mLs Intravenous New Bag/Given 01/26/24 1639)  metoCLOPramide  (REGLAN ) injection 10 mg (10 mg Intravenous Given 01/26/24 1806)  ketorolac  (TORADOL ) 15 MG/ML injection 15 mg (15 mg Intravenous Given 01/26/24 1805)                                    Medical Decision Making Amount and/or Complexity of Data Reviewed Labs: ordered. Radiology: ordered.  Risk Prescription drug management.   This patient presents to the ED for concern of syncope, this involves an extensive number of treatment options, and is a complaint that carries with it a high risk of complications and morbidity.  The differential diagnosis includes vasovagal syncope, dehydration, ACS, PE, pneumonia   Co morbidities that complicate the patient evaluation  Type 2 diabetes, morbid obesity, migraine disorder, tobacco  use   Lab Tests:  I Ordered, and personally interpreted labs.  The pertinent results include: CBC leukocytosis of 14.1 but no appreciable anemia with hemoglobin of 14.1, CMP slight transaminitis with ALT elevation of 47 alkaline phosphatase at 158, troponin less than 15 x 2, respiratory panel negative for COVID-19, flu Enza, RSV   Imaging Studies ordered:  I ordered imaging studies including CT head, chest x-ray I independently visualized and interpreted imaging which showed chest x-ray with no acute findings, CT head negative for acute findings I agree with the radiologist interpretation   Cardiac Monitoring: / EKG:  The patient was maintained on a cardiac monitor.  I personally viewed and interpreted the cardiac monitored which showed an underlying rhythm of: Sinus rhythm   Consultations Obtained:  I requested consultation with none, and discussed lab and imaging findings as well as pertinent plan - they recommend: N/A   Problem List / ED Course / Critical interventions / Medication management  Patient presents the emergency department today with concerns of syncopal episode.  She has a past history significant for type 2 diabetes, migraine disorder, morbid obesity, tobacco use.  She states that she was at home when she tried to sit up from a chair and she began to feel lightheaded and reportedly passed out.  She was caught and lowered to the ground.  No reported injury or head strike.  She endorses ongoing migraine headache that she has had for the last several days.  She was notably hypotensive on arrival by EMS with blood pressure at 74/50.  Has received fluids with improvement in her blood pressure.  Denies any fever, chills, cough, congestion, sore throat or bodyaches.  No reported urinary symptoms.  Denies any recent changes to any home medications. Physical exam is unremarkable patient appearing normal.  Vitals unremarkable with  no tachycardia, fever, blood pressure appears to  show correction in hypotension. Labs show leukocytosis which is somewhat patient's baseline at 14.1.  No appreciable anemia.  CMP unremarkable.  Respiratory panel negative.  Troponin negative x 2.  Chest x-ray and CT head negative for acute findings. On reevaluation after fluids, Toradol , and Reglan , patient report improvement symptoms.  Suspect patient single episode was most likely secondary to dehydration.  Advise close monitoring and management and hydration at home.  Return precautions advised.  Discharged home in stable condition. I ordered medication including fluids, Toradol , Reglan  for dehydration, migraine Reevaluation of the patient after these medicines showed that the patient improved I have reviewed the patients home medicines and have made adjustments as needed   Social Determinants of Health:  None   Test / Admission - Considered:  Considered but stable for outpatient follow-up.  Final diagnoses:  Syncope, unspecified syncope type  Dehydration    ED Discharge Orders     None          Jesiah Grismer A, PA-C 01/26/24 LLEWELLYN Patsey Lot, MD 01/27/24 [phone]

## 2024-01-26 NOTE — Discharge Instructions (Signed)
 You were seen in the emergency department today for concerns of loss of consciousness.  Your labs and imaging were reassuring.  It does appear that you responded well to fluids I suspect this was likely due to dehydration.  Monitor your symptoms over the next 4 days and follow-up closely with your primary care provider.  If you have any concerns for worsening symptoms, return to the emergency department.

## 2024-01-26 NOTE — ED Triage Notes (Signed)
 Pt BIBA from home for witness syncopal episode, no trauma or injury. Pt was hypotensive with a BP of 74/50 HR 77 94% on 2L (not used at home) 90% on room air CBG 283  A&O x 4

## 2024-01-28 ENCOUNTER — Other Ambulatory Visit (INDEPENDENT_AMBULATORY_CARE_PROVIDER_SITE_OTHER): Payer: Self-pay | Admitting: Primary Care

## 2024-01-28 DIAGNOSIS — M2550 Pain in unspecified joint: Secondary | ICD-10-CM

## 2024-01-28 DIAGNOSIS — E119 Type 2 diabetes mellitus without complications: Secondary | ICD-10-CM

## 2024-01-29 ENCOUNTER — Other Ambulatory Visit (INDEPENDENT_AMBULATORY_CARE_PROVIDER_SITE_OTHER): Payer: Self-pay | Admitting: Primary Care

## 2024-01-29 DIAGNOSIS — I10 Essential (primary) hypertension: Secondary | ICD-10-CM

## 2024-01-29 DIAGNOSIS — K219 Gastro-esophageal reflux disease without esophagitis: Secondary | ICD-10-CM

## 2024-01-29 NOTE — Telephone Encounter (Signed)
 Will forward to provider

## 2024-02-05 ENCOUNTER — Telehealth: Payer: Self-pay | Admitting: Physician Assistant

## 2024-02-05 ENCOUNTER — Other Ambulatory Visit: Payer: Self-pay | Admitting: Physician Assistant

## 2024-02-05 MED ORDER — TRAMADOL HCL 50 MG PO TABS: 50.0000 mg | ORAL_TABLET | Freq: Every day | ORAL | 0 refills | Status: DC | PRN

## 2024-02-05 NOTE — Telephone Encounter (Signed)
 Patient called. Her pharmacy did not receive the RX for the tramadol . She would like it sent to [facility].

## 2024-02-05 NOTE — Telephone Encounter (Signed)
 Sent in on 10/17 but looks like she never picked it up.  I will resend as they may have put it back on the shelf by now

## 2024-02-05 NOTE — Telephone Encounter (Signed)
 Patient aware

## 2024-02-07 ENCOUNTER — Other Ambulatory Visit (INDEPENDENT_AMBULATORY_CARE_PROVIDER_SITE_OTHER): Payer: Self-pay | Admitting: Primary Care

## 2024-02-07 DIAGNOSIS — E119 Type 2 diabetes mellitus without complications: Secondary | ICD-10-CM

## 2024-02-07 NOTE — Telephone Encounter (Unsigned)
 Copied from CRM [id number]. Topic: Clinical - Medication Refill >> Feb 07, 2024  1:53 PM Selinda RAMAN wrote: Medication: LANTUS  SOLOSTAR 100 UNIT/ML Solostar Pen  Has the patient contacted their pharmacy? Yes   This is the patient's preferred pharmacy:  [facility] - [place], [place] - [address] [place] [address] Phone: [phone] Fax: [phone]    Is this the correct pharmacy for this prescription? Yes If no, delete pharmacy and type the correct one.   Has the prescription been filled recently? Yes  Is the patient out of the medication? Yes  Has the patient been seen for an appointment in the last year OR does the patient have an upcoming appointment? Yes  Can we respond through MyChart? Yes Please assist patient further as I told her a refill was called in on 01/28/24 and there was an additional refill on there as well but she states the pharmacy told her to call her provider

## 2024-02-07 NOTE — Telephone Encounter (Signed)
 Per PAS:  Pharmacy told her to contact her provider. I see where it was called in on 01/28/24 and an additional refill is on there. She states she doesn't have it and doesn't know what is going on she only knows she needs it.  This RN contacted [facility] and was advised that one box was delivered on 01/28/24 and will be due for another refill on Feb 23, 2024 (pharmacist reports she was given a 26 day supply on 01/28/24 and already have another order on file for her.   This RN contacted patient to make her aware. Asked her to confirm that she has the Lantus  Solostar in the home and she has the Lantus  Solostar and it is actually the Mounjaro  she is out of. Will addend request.

## 2024-02-07 NOTE — Telephone Encounter (Signed)
 Requested medication (s) are due for refill today: Yes  Requested medication (s) are on the active medication list: Yes  Last refill:  11/04/23  Future visit scheduled: No  Notes to clinic:  Unable to refill due to no refill protocol for this medication.      Requested Prescriptions  Pending Prescriptions Disp Refills   tirzepatide  (MOUNJARO ) 2.5 MG/0.5ML Pen 2 mL 1     Off-Protocol Failed - 02/07/2024  2:32 PM      Failed - Medication not assigned to a protocol, review manually.      Passed - Valid encounter within last 12 months    Recent Outpatient Visits           3 months ago Primary osteoarthritis of both knees   [place] [facility] Celestia Rosaline SQUIBB, NP   5 months ago Type 2 diabetes mellitus without complication, without long-term current use of insulin  (HCC)   [place] [facility] Celestia Rosaline SQUIBB, NP   9 months ago Type 2 diabetes mellitus without complication, without long-term current use of insulin  [facility])   [place] [facility] Celestia Rosaline SQUIBB, NP   1 year ago Primary osteoarthritis of both knees   [place] [facility] Celestia Rosaline SQUIBB, NP   1 year ago Type 2 diabetes mellitus without complication, without long-term current use of insulin  [facility])    [facility] Celestia Rosaline SQUIBB, NP

## 2024-02-08 ENCOUNTER — Encounter (HOSPITAL_COMMUNITY): Payer: Self-pay | Admitting: Psychiatry

## 2024-02-08 ENCOUNTER — Telehealth (HOSPITAL_COMMUNITY): Admitting: Psychiatry

## 2024-02-08 DIAGNOSIS — R41840 Attention and concentration deficit: Secondary | ICD-10-CM | POA: Diagnosis not present

## 2024-02-08 DIAGNOSIS — F3341 Major depressive disorder, recurrent, in partial remission: Secondary | ICD-10-CM | POA: Diagnosis not present

## 2024-02-08 DIAGNOSIS — Z720 Tobacco use: Secondary | ICD-10-CM

## 2024-02-08 DIAGNOSIS — F411 Generalized anxiety disorder: Secondary | ICD-10-CM | POA: Diagnosis not present

## 2024-02-08 DIAGNOSIS — F4329 Adjustment disorder with other symptoms: Secondary | ICD-10-CM | POA: Diagnosis not present

## 2024-02-08 MED ORDER — ATOMOXETINE HCL 40 MG PO CAPS: 40.0000 mg | ORAL_CAPSULE | Freq: Every day | ORAL | 3 refills | Status: DC

## 2024-02-08 MED ORDER — DULOXETINE HCL 60 MG PO CPEP: ORAL_CAPSULE | ORAL | 3 refills | Status: DC

## 2024-02-08 MED ORDER — TRAZODONE HCL 100 MG PO TABS: 100.0000 mg | ORAL_TABLET | Freq: Every day | ORAL | 3 refills | Status: DC

## 2024-02-08 MED ORDER — NICOTINE POLACRILEX 2 MG MT GUM: 2.0000 mg | CHEWING_GUM | OROMUCOSAL | 3 refills | Status: DC | PRN

## 2024-02-08 NOTE — Progress Notes (Signed)
 BH MD/PA/NP OP Progress Note Virtual Visit via Video Note  I connected with [name] on 02/08/24 at 11:00 AM EDT by a video enabled telemedicine application and verified that I am speaking with the correct person using two identifiers.  Location: Patient: Home Provider: Clinic   I discussed the limitations of evaluation and management by telemedicine and the availability of in person appointments. The patient expressed understanding and agreed to proceed.  I provided 30 minutes of non-face-to-face time during this encounter.    02/08/2024 11:22 AM [name]  MRN:  [id number]  Chief Complaint: I continue to have headaches  HPI: 53 year old female seen today for follow-up psychiatric evaluation.   She has a psychiatric history of tobacco dependence (in remission) depression and prolonged grief.  Currently she is managed on Strattera  40 mg daily, Nicoderm gum, and trazodone  100 mg nightly, and Cymbalta  60 mg twice daily.  She notes her medications are effective in managing her psychiatric conditions.  Today patient was well-groomed, pleasant, cooperative, and engaged in conversation.  She informed clinical research associate that she continues to have headaches. Per chart review patient was seen in ED for unspecified syncope. Patient notes that she passed out at a friends house after experiencing a migraine. At times she notes that it effects her vision.  She reports that she has eye exam in the new year.  While at the ED patient had a CT.  No abnormal findings noted.  Patient does note that she was prescribed Imitrex  and finds it somewhat effective in managing her pain which she quantifies as 10 out of 10.  Patient informed clinical research associate that recently her niece passed away.  She notes that she is going to be with her niece's mother today to plan her services.  Despite the above stressors patient notes that mentally she is doing well.  Today provider conducted GAD-7 and patient was 0.  Provider also  conducted PHQ-9 and she score of 4.  She endorsed adequate sleep and appetite.  Today she denies SI/HI/VAH, mania, or paranoia.   Provider informed patient that headaches are a common side effect of trazodone .  She does note that she takes trazodone  daily.  Provider instructed patient to take trazodone  less frequently to see if her headaches subside.  She endorsed understanding and agreed.  No medication changes made today.  Patient agreeable to continue medication as prescribed.  No other concerns noted at this time.    Visit Diagnosis:    ICD-10-CM   1. Tobacco use  Z72.0 nicotine  polacrilex (NICORETTE ) 2 MG gum    2. MDD (major depressive disorder), recurrent, in partial remission  F33.41 traZODone  (DESYREL ) 100 MG tablet    DULoxetine  (CYMBALTA ) 60 MG capsule    3. Generalized anxiety disorder  F41.1 DULoxetine  (CYMBALTA ) 60 MG capsule    4. 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 DULoxetine  (CYMBALTA ) 60 MG capsule    5. Poor concentration  R41.840 atomoxetine  (STRATTERA ) 40 MG capsule           Past Psychiatric History:  MDD, grief with prolonged bereavement reaction, tobacco dependence  Past Medical History:  Past Medical History:  Diagnosis Date   Anxiety    Asthma    Common migraine with intractable migraine 10/02/2016   Depression    Diabetes mellitus    Hyperlipidemia    Hypertension    Sleep apnea     Past Surgical History:  Procedure Laterality Date   CHOLECYSTECTOMY     TUBAL LIGATION  Family Psychiatric History: Denied any family psychiatric history  Family History:  Family History  Problem Relation Age of Onset   Diabetes Mother    Hypertension Mother    Renal Disease Mother    Colon cancer Father    Cancer Father    Diabetes Sister    Diabetes Sister    Diabetes Brother    Renal Disease Brother    Diabetes Brother    Diabetes Brother    Diabetes Brother    Diabetes Brother    Healthy Daughter    Healthy Daughter    Rectal  cancer Neg Hx    Stomach cancer Neg Hx    Esophageal cancer Neg Hx     Social History:  Social History   Socioeconomic History   Marital status: Single    Spouse name: Not on file   Number of children: Not on file   Years of education: Not on file   Highest education level: Not on file  Occupational History   Not on file  Tobacco Use   Smoking status: Every Day    Current packs/day: 0.25    Types: Cigarettes   Smokeless tobacco: Never   Tobacco comments:    Smoking 2-3 per day.  Trying to quit.  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  Vaping Use   Vaping status: Former  Substance and Sexual Activity   Alcohol use: No   Drug use: No   Sexual activity: Not on file  Other Topics Concern   Not on file  Social History Narrative   Right handed    Lives with daughter   Caffeine  use: Drinks coffee/tea/soda sometimes   Social Drivers of Corporate Investment Banker Strain: Low Risk  (06/30/2022)   Overall Financial Resource Strain (CARDIA)    Difficulty of Paying Living Expenses: Not hard at all  Food Insecurity: No Food Insecurity (08/14/2023)   Hunger Vital Sign    Worried About Running Out of Food in the Last Year: Never true    Ran Out of Food in the Last Year: Never true  Transportation Needs: No Transportation Needs (04/20/2023)   PRAPARE - Administrator, Civil Service (Medical): No    Lack of Transportation (Non-Medical): No  Physical Activity: Sufficiently Active (06/30/2022)   Exercise Vital Sign    Days of Exercise per Week: 5 days    Minutes of Exercise per Session: 30 min  Stress: No Stress Concern Present (06/30/2022)   [employer] of Occupational Health - Occupational Stress Questionnaire    Feeling of Stress : Not at all  Social Connections: Socially Integrated (06/30/2022)   Social Connection and Isolation Panel    Frequency of Communication with Friends and Family: More than three times a week    Frequency of Social Gatherings with Friends and Family: More than  three times a week    Attends Religious Services: 1 to 4 times per year    Active Member of [employer] or Organizations: Yes    Attends Banker Meetings: 1 to 4 times per year    Marital Status: Living with partner    Allergies:  Allergies  Allergen Reactions   Lisinopril  Swelling    angioedema   Trulicity  [Dulaglutide ] Other (See Comments)    Gas, abdominal bloating    Metabolic Disorder Labs: Lab Results  Component Value Date   HGBA1C 11.4 (A) 12/25/2023   No results found for: PROLACTIN Lab Results  Component Value Date   CHOL 206 (H) 08/14/2023  TRIG 117 08/14/2023   HDL 48 08/14/2023   CHOLHDL 4.3 08/14/2023   LDLCALC 137 (H) 08/14/2023   LDLCALC 131 (H) 05/29/2022   Lab Results  Component Value Date   TSH 2.870 10/02/2018   TSH 4.840 (H) 01/09/2018    Therapeutic Level Labs: No results found for: LITHIUM No results found for: VALPROATE No results found for: CBMZ  Current Medications: Current Outpatient Medications  Medication Sig Dispense Refill   Accu-Chek Softclix Lancets lancets Use to check blood sugar three times daily. 100 each 6   acetaminophen -codeine  (TYLENOL  #3) 300-30 MG tablet Take 1 tablet by mouth 2 (two) times daily as needed. 30 tablet 1   albuterol  (VENTOLIN  HFA) 108 (90 Base) MCG/ACT inhaler Inhale 1-2 puffs into the lungs every 6 (six) hours as needed for up to 14 days for wheezing or shortness of breath. 1 each 0   amLODipine  (NORVASC ) 10 MG tablet TAKE 1 TABLET BY MOUTH DAILY. (AM) 90 tablet 0   aspirin  EC 81 MG tablet Take 81 mg by mouth every 6 (six) hours as needed for mild pain.      atomoxetine  (STRATTERA ) 40 MG capsule Take 1 capsule (40 mg total) by mouth daily. 30 capsule 3   benzonatate  (TESSALON ) 100 MG capsule Take 1 capsule (100 mg total) by mouth every 8 (eight) hours. 21 capsule 0   blood glucose meter kit and supplies Dispense based on patient and insurance preference. Use up to four times daily as  directed. (FOR ICD-10 E10.9, E11.9). (Patient not taking: Reported on 10/25/2023) 1 each 0   Blood Glucose Monitoring Suppl (ACCU-CHEK GUIDE) w/Device KIT Use to check blood sugar three times daily. (Patient not taking: Reported on 10/25/2023) 1 kit 0   Butalbital -APAP-Caffeine  50-300-40 MG CAPS Take 1 capsule by mouth as needed.     celecoxib  (CELEBREX ) 200 MG capsule TAKE ONE CAPSULE BY MOUTH TWICE DAILY (AM+BEDTIME) 60 capsule 0   cetirizine  (ZYRTEC ) 10 MG tablet Take 10 mg by mouth as needed. (Patient not taking: Reported on 10/25/2023)     Continuous Blood Gluc Receiver (FREESTYLE LIBRE 2 READER) DEVI Use to check blood sugar continuously throughout the day. Change sensors once every 14 days. E11.9 (Patient not taking: Reported on 10/25/2023) 1 each 0   Continuous Blood Gluc Sensor (FREESTYLE LIBRE 2 SENSOR) MISC Use to check blood sugar continuously throughout the day. Change sensors once every 14 days. E11.9 (Patient not taking: Reported on 10/25/2023) 2 each 3   dicyclomine  (BENTYL ) 20 MG tablet Take 1 tablet (20 mg total) by mouth 2 (two) times daily. (Patient not taking: Reported on 10/25/2023) 20 tablet 0   DULoxetine  (CYMBALTA ) 60 MG capsule TAKE 1 CAPSULE (60 MG TOTAL) BY MOUTH 2 (TWO) TIMES DAILY (AM+BEDTIME) 90 capsule 3   fluticasone  (FLONASE ) 50 MCG/ACT nasal spray PLACE 2 SPRAYS INTO BOTH NOSTRILS DAILY. (Patient not taking: Reported on 10/25/2023) 16 g 2   fluticasone  (FLOVENT  HFA) 44 MCG/ACT inhaler Inhale 2 puffs into the lungs 2 (two) times daily. (Patient not taking: Reported on 10/25/2023) 1 Inhaler 12   gabapentin  (NEURONTIN ) 300 MG capsule TAKE ONE CAPSULE BY MOUTH THREE TIMES DAILY ( AM+NOON+BEDTIME) (Patient not taking: No sig reported) 90 capsule 0   glucose blood (ACCU-CHEK GUIDE) test strip Use to check blood sugar three times daily. (Patient not taking: Reported on 10/25/2023) 100 each 6   hydrOXYzine  (ATARAX ) 10 MG tablet Take 1 tablet (10 mg total) by mouth 3 (three) times daily  as needed. (Patient  not taking: No sig reported) 90 tablet 3   Insulin  Pen Needle (EASY COMFORT PEN NEEDLES) 31G X 8 MM MISC USE TO INJECT Lantus  once A DAY 100 each 2   LANTUS  SOLOSTAR 100 UNIT/ML Solostar Pen INJECT 56 UNITS INTO THE SKIN DAILY. 45 mL 1   lidocaine  (XYLOCAINE ) 5 % ointment Apply 1 Application topically 4 (four) times daily as needed. (Patient not taking: Reported on 10/25/2023) 35.44 g 0   losartan  (COZAAR ) 50 MG tablet TAKE 1 TABLET (50 MG TOTAL) BY MOUTH DAILY (AM) 90 tablet 1   magic mouthwash (nystatin , lidocaine , diphenhydrAMINE , alum & mag hydroxide) suspension Swish and spit 5 mLs 4 (four) times daily. 180 mL 0   metFORMIN  (GLUCOPHAGE ) 1000 MG tablet TAKE ONE TABLET BY MOUTH TWICE DAILY (AM+BEDTIME) 90 tablet 0   MOUNJARO  2.5 MG/0.5ML Pen INJECT 2.5 MG INTO THE SKIN ONCE A WEEK. 2 mL 1   nicotine  (NICODERM CQ  - DOSED IN MG/24 HR) 7 mg/24hr patch Place 1 patch (7 mg total) onto the skin daily. Remove before bedtime. (Patient not taking: Reported on 10/25/2023) 28 patch 5   nicotine  polacrilex (NICORETTE ) 2 MG gum Take 1 each (2 mg total) by mouth as needed for smoking cessation. Use every 1-2 hours as needed. 100 tablet 3   ondansetron  (ZOFRAN ) 4 MG tablet Take 1 tablet (4 mg total) by mouth every 8 (eight) hours as needed for nausea or vomiting. 20 tablet 0   pantoprazole  (PROTONIX ) 40 MG tablet TAKE 1 TABLET BY MOUTH DAILY (AM) (Patient not taking: Reported on 10/25/2023) 30 tablet 3   pravastatin  (PRAVACHOL ) 20 MG tablet Take 1 tablet (20 mg total) by mouth daily. (Patient not taking: Reported on 10/25/2023) 90 tablet 1   promethazine -dextromethorphan  (PROMETHAZINE -DM) 6.25-15 MG/5ML syrup Take 5 mLs by mouth at bedtime as needed for cough. 118 mL 0   SUMAtriptan  (IMITREX ) 50 MG tablet TAKE 1 TABLET (50 MG TOTAL) BY MOUTH EVERY 2 (TWO) HOURS AS NEEDED FOR MIGRAINE. MAY REPEAT IN 2 HOURS IF HEADACHE PERSISTS OR RECURS. 10 tablet 0   traMADol  (ULTRAM ) 50 MG tablet Take 1-2  tablets (50-100 mg total) by mouth daily as needed. 20 tablet 0   traZODone  (DESYREL ) 100 MG tablet Take 1 tablet (100 mg total) by mouth at bedtime. 30 tablet 3   No current facility-administered medications for this visit.     Musculoskeletal: Strength & Muscle Tone: within normal limits and telehealth visit Gait & Station: normal, telehealth visit Patient leans: N/A  Psychiatric Specialty Exam: Review of Systems  There were no vitals taken for this visit.There is no height or weight on file to calculate BMI.  General Appearance: Well Groomed  Eye Contact:  Good  Speech:  Clear and Coherent and Normal Rate  Volume:  Normal  Mood:  Euthymic,   Affect:  Appropriate and Congruent  Thought Process:  Coherent, Goal Directed, and Linear  Orientation:  Full (Time, Place, and Person)  Thought Content: WDL and Logical   Suicidal Thoughts:  No  Homicidal Thoughts:  No  Memory:  Immediate;   Good Recent;   Good Remote;   Good  Judgement:  Good  Insight:  Good  Psychomotor Activity:  Normal  Concentration:  Concentration: Good and Attention Span: Good  Recall:  Good  Fund of Knowledge: Good  Language: Good  Akathisia:  No  Handed:  Right  AIMS (if indicated): not done  Assets:  Communication Skills Desire for Improvement Financial Resources/Insurance Housing Leisure Time Physical  Health Social Support  ADL's:  Intact  Cognition: WNL  Sleep:  Good   Screenings: GAD-7    Flowsheet Row Video Visit from 02/08/2024 in [facility] Office Visit from 08/14/2023 in [facility] Video Visit from 05/25/2023 in [facility] Office Visit from 04/23/2023 in [place] [facility] Video Visit from 03/30/2023 in [facility]  Total GAD-7 Score 0 8 10 0 2   PHQ2-9    Flowsheet Row Video Visit from 02/08/2024 in [facility] Office Visit from 08/14/2023 in  [facility] Video Visit from 05/25/2023 in [facility] Office Visit from 04/23/2023 in [place] [facility] Office Visit from 04/20/2023 in [facility]  PHQ-2 Total Score 2 4 0 0 1  PHQ-9 Total Score 4 8 4 5  --   Flowsheet Row ED from 01/26/2024 in [facility] from 01/09/2024 in [facility] from 12/20/2023 in [facility] at [facility] RISK CATEGORY No Risk No Risk No Risk     Assessment and Plan: Patient reports that she continues to have headaches and dizziness.  She has been followed by her PCP for this.  Patient reports that she has an upcoming eye exam in the new year.  Patient recently started on Imitrex . Provider informed patient that headaches are a common side effect of trazodone .  She does note that she takes trazodone  daily.  Provider instructed patient to take trazodone  less frequently to see if her headaches subside.  She endorsed understanding and agreed.     1. MDD (major depressive disorder), recurrent, in partial remission  Continue- traZODone  (DESYREL ) 100 MG tablet; Take 1 tablet (100 mg total) by mouth at bedtime.  Dispense: 30 tablet; Refill: 3 Continue- DULoxetine  (CYMBALTA ) 60 MG capsule; TAKE 1 CAPSULE (60 MG TOTAL) BY MOUTH 2 (TWO) TIMES DAILY (AM+BEDTIME)  Dispense: 90 capsule; Refill: 3  2. Generalized anxiety disorder  Continue- DULoxetine  (CYMBALTA ) 60 MG capsule; TAKE 1 CAPSULE (60 MG TOTAL) BY MOUTH 2 (TWO) TIMES DAILY (AM+BEDTIME)  Dispense: 90 capsule; Refill: 3  3. Grief reaction with prolonged bereavement  Continue- DULoxetine  (CYMBALTA ) 60 MG capsule; TAKE 1 CAPSULE (60 MG TOTAL) BY MOUTH 2 (TWO) TIMES DAILY (AM+BEDTIME)  Dispense: 90 capsule; Refill: 3.  4. Tobacco use (Primary)  Continue- nicotine  polacrilex (NICORETTE ) 2 MG gum; Take 1 each (2 mg total) by mouth as needed for  smoking cessation. Use every 1-2 hours as needed.  Dispense: 100 tablet; Refill: 3  5. Poor concentration  Continue- atomoxetine  (STRATTERA ) 40 MG capsule; Take 1 capsule (40 mg total) by mouth daily.  Dispense: 30 capsule; Refill: 3     Follow-up in 2 months Zane FORBES Bach, NP 02/08/2024, 11:22 AM

## 2024-02-11 ENCOUNTER — Encounter: Payer: Self-pay | Admitting: Radiology

## 2024-02-15 ENCOUNTER — Other Ambulatory Visit (INDEPENDENT_AMBULATORY_CARE_PROVIDER_SITE_OTHER): Payer: Self-pay | Admitting: Primary Care

## 2024-02-15 DIAGNOSIS — E119 Type 2 diabetes mellitus without complications: Secondary | ICD-10-CM

## 2024-02-20 ENCOUNTER — Other Ambulatory Visit (INDEPENDENT_AMBULATORY_CARE_PROVIDER_SITE_OTHER): Payer: Self-pay | Admitting: Primary Care

## 2024-02-20 DIAGNOSIS — E119 Type 2 diabetes mellitus without complications: Secondary | ICD-10-CM

## 2024-02-20 NOTE — Telephone Encounter (Signed)
 Copied from CRM [id number]. Topic: Clinical - Medication Refill >> Feb 20, 2024 10:41 AM Zebedee SAUNDERS wrote: Medication: LANTUS  SOLOSTAR 100 UNIT/ML Solostar Pen  Has the patient contacted their pharmacy? Yes (Agent: If no, request that the patient contact the pharmacy for the refill. If patient does not wish to contact the pharmacy document the reason why and proceed with request.) (Agent: If yes, when and what did the pharmacy advise?)  This is the patient's preferred pharmacy:  [facility] - [place], [place] - [address] [place] [address] Phone: [phone] Fax: [phone]  Is this the correct pharmacy for this prescription? Yes If no, delete pharmacy and type the correct one.   Has the prescription been filled recently? Yes  Is the patient out of the medication? Yes  Has the patient been seen for an appointment in the last year OR does the patient have an upcoming appointment? Yes  Can we respond through MyChart? Yes  Agent: Please be advised that Rx refills may take up to 3 business days. We ask that you follow-up with your pharmacy.

## 2024-02-20 NOTE — Telephone Encounter (Signed)
 Copied from CRM [id number]. Topic: Clinical - Medication Refill >> Feb 20, 2024 10:41 AM Zebedee SAUNDERS wrote: Medication: LANTUS  SOLOSTAR 100 UNIT/ML Solostar Pen  Has the patient contacted their pharmacy? Yes (Agent: If no, request that the patient contact the pharmacy for the refill. If patient does not wish to contact the pharmacy document the reason why and proceed with request.) (Agent: If yes, when and what did the pharmacy advise?)  This is the patient's preferred pharmacy:  [facility] - [place], [place] - [address] [place] [address] Phone: [phone] Fax: [phone]  Is this the correct pharmacy for this prescription? Yes If no, delete pharmacy and type the correct one.   Has the prescription been filled recently? Yes  Is the patient out of the medication? Yes  Has the patient been seen for an appointment in the last year OR does the patient have an upcoming appointment? Yes  Can we respond through MyChart? Yes  Agent: Please be advised that Rx refills may take up to 3 business days. We ask that you follow-up with your pharmacy. >> Feb 20, 2024 10:46 AM Zebedee SAUNDERS wrote: Pt needs MOUNJARO  2.5 MG/0.5ML Pen sent to: [facility] - [place], [place] - [address] [place] [address] Phone: [phone] Fax: [phone]

## 2024-02-21 ENCOUNTER — Other Ambulatory Visit (INDEPENDENT_AMBULATORY_CARE_PROVIDER_SITE_OTHER): Payer: Self-pay | Admitting: Primary Care

## 2024-02-21 DIAGNOSIS — E119 Type 2 diabetes mellitus without complications: Secondary | ICD-10-CM

## 2024-02-21 NOTE — Telephone Encounter (Signed)
  Copied from CRM [id number]. Topic: Clinical - Medication Refill >> Feb 20, 2024 10:41 AM [name] wrote: Medication: LANTUS  SOLOSTAR 100 UNIT/ML Solostar Pen  Has the patient contacted their pharmacy? Yes (Agent: If no, request that the patient contact the pharmacy for the refill. If patient does not wish to contact the pharmacy document the reason why and proceed with request.) (Agent: If yes, when and what did the pharmacy advise?)  This is the patient's preferred pharmacy:  [facility] - [place], [place] - [address] [place] [address] Phone: [phone] Fax: [phone]  Is this the correct pharmacy for this prescription? Yes If no, delete pharmacy and type the correct one.   Has the prescription been filled recently? Yes  Is the patient out of the medication? Yes  Has the patient been seen for an appointment in the last year OR does the patient have an upcoming appointment? Yes  Can we respond through MyChart? Yes  Agent: Please be advised that Rx refills may take up to 3 business days. We ask that you follow-up with your pharmacy. >> Feb 21, 2024  2:54 PM Montie POUR wrote: [name] is calling back to check on the above refill. Chart shows this:  02/15/24 0925 Reorder [name], RMA To Nmizm:[id number]  02/21/24 1311 Reorder Interface, Surescripts Out To [phone]  I let [name] know it is in process.  >> Feb 20, 2024 10:46 AM [name] wrote: Pt needs MOUNJARO  2.5 MG/0.5ML Pen sent to: [facility] - [place], [place] - [address] [place] [address] Phone: [phone] Fax: [phone]

## 2024-03-13 ENCOUNTER — Other Ambulatory Visit (INDEPENDENT_AMBULATORY_CARE_PROVIDER_SITE_OTHER): Payer: Self-pay | Admitting: Primary Care

## 2024-03-13 DIAGNOSIS — M2550 Pain in unspecified joint: Secondary | ICD-10-CM

## 2024-03-13 DIAGNOSIS — E785 Hyperlipidemia, unspecified: Secondary | ICD-10-CM

## 2024-03-13 DIAGNOSIS — I10 Essential (primary) hypertension: Secondary | ICD-10-CM

## 2024-03-13 DIAGNOSIS — E119 Type 2 diabetes mellitus without complications: Secondary | ICD-10-CM

## 2024-03-13 DIAGNOSIS — K219 Gastro-esophageal reflux disease without esophagitis: Secondary | ICD-10-CM

## 2024-03-14 NOTE — Telephone Encounter (Signed)
 Will forward to provider

## 2024-03-17 ENCOUNTER — Telehealth (INDEPENDENT_AMBULATORY_CARE_PROVIDER_SITE_OTHER): Payer: Self-pay | Admitting: Primary Care

## 2024-03-17 ENCOUNTER — Telehealth (INDEPENDENT_AMBULATORY_CARE_PROVIDER_SITE_OTHER): Admitting: Primary Care

## 2024-03-17 DIAGNOSIS — M1711 Unilateral primary osteoarthritis, right knee: Secondary | ICD-10-CM | POA: Diagnosis not present

## 2024-03-17 DIAGNOSIS — E119 Type 2 diabetes mellitus without complications: Secondary | ICD-10-CM | POA: Diagnosis not present

## 2024-03-17 DIAGNOSIS — M1712 Unilateral primary osteoarthritis, left knee: Secondary | ICD-10-CM

## 2024-03-17 DIAGNOSIS — Z794 Long term (current) use of insulin: Secondary | ICD-10-CM

## 2024-03-17 DIAGNOSIS — M170 Bilateral primary osteoarthritis of knee: Secondary | ICD-10-CM | POA: Diagnosis not present

## 2024-03-17 MED ORDER — IBUPROFEN 800 MG PO TABS: 800.0000 mg | ORAL_TABLET | Freq: Three times a day (TID) | ORAL | 1 refills | Status: DC | PRN

## 2024-03-17 MED ORDER — LANTUS SOLOSTAR 100 UNIT/ML ~~LOC~~ SOPN: 56.0000 [IU] | PEN_INJECTOR | Freq: Every day | SUBCUTANEOUS | 1 refills | Status: AC

## 2024-03-17 NOTE — Progress Notes (Signed)
 [facility]  Virtual Visit Note  I connected with [name], on 03/17/2024 at 2:47 PM through an audio and video application and verified that I am speaking with the correct person using two identifiers.   Consent: I discussed the limitations, risks, security and privacy concerns of performing an evaluation and management service by mychart and the availability of in person appointments. I also discussed with the patient that there may be a patient responsible charge related to this service. The patient expressed understanding and agreed to proceed.   Location of Patient: Home due to increment weather   Location of Provider: [facility]   Persons participating in visit: [name] Cart [name] Celestia,  NP   History of Present Illness: [name] is 53 year old obese female bilateral knee pain, T2D-Denies polyuria, polydipsia, polyphasia or vision changes.  Does not check blood sugars at home. Requesting medication refills   Past Medical History:  Diagnosis Date   Anxiety    Asthma    Common migraine with intractable migraine 10/02/2016   Depression    Diabetes mellitus    Hyperlipidemia    Hypertension    Sleep apnea    Allergies  Allergen Reactions   Lisinopril  Swelling    angioedema   Trulicity  [Dulaglutide ] Other (See Comments)    Gas, abdominal bloating    Current Outpatient Medications on File Prior to Visit  Medication Sig Dispense Refill   Accu-Chek Softclix Lancets lancets Use to check blood sugar three times daily. 100 each 6   acetaminophen -codeine  (TYLENOL  #3) 300-30 MG tablet Take 1 tablet by mouth 2 (two) times daily as needed. 30 tablet 1   albuterol  (VENTOLIN  HFA) 108 (90 Base) MCG/ACT inhaler Inhale 1-2 puffs into the lungs every 6 (six) hours as needed for up to 14 days for wheezing or shortness of breath. 1 each 0   amLODipine  (NORVASC ) 10 MG tablet TAKE 1 TABLET BY MOUTH  DAILY. (AM) 90 tablet 0   aspirin  EC 81 MG tablet Take 81 mg by mouth every 6 (six) hours as needed for mild pain.      atomoxetine  (STRATTERA ) 40 MG capsule Take 1 capsule (40 mg total) by mouth daily. 30 capsule 3   benzonatate  (TESSALON ) 100 MG capsule Take 1 capsule (100 mg total) by mouth every 8 (eight) hours. 21 capsule 0   blood glucose meter kit and supplies Dispense based on patient and insurance preference. Use up to four times daily as directed. (FOR ICD-10 E10.9, E11.9). (Patient not taking: Reported on 10/25/2023) 1 each 0   Blood Glucose Monitoring Suppl (ACCU-CHEK GUIDE) w/Device KIT Use to check blood sugar three times daily. (Patient not taking: Reported on 10/25/2023) 1 kit 0   Butalbital -APAP-Caffeine  50-300-40 MG CAPS Take 1 capsule by mouth as needed.     celecoxib  (CELEBREX ) 200 MG capsule TAKE ONE CAPSULE BY MOUTH TWICE DAILY (AM+BEDTIME) 60 capsule 0   cetirizine  (ZYRTEC ) 10 MG tablet Take 10 mg by mouth as needed. (Patient not taking: Reported on 10/25/2023)     Continuous Blood Gluc Receiver (FREESTYLE LIBRE 2 READER) DEVI Use to check blood sugar continuously throughout the day. Change sensors once every 14 days. E11.9 (Patient not taking: Reported on 10/25/2023) 1 each 0   Continuous Blood Gluc Sensor (FREESTYLE LIBRE 2 SENSOR) MISC Use to check blood sugar continuously throughout the day. Change sensors once every 14 days. E11.9 (Patient not taking: Reported on 10/25/2023) 2 each 3  dicyclomine  (BENTYL ) 20 MG tablet Take 1 tablet (20 mg total) by mouth 2 (two) times daily. (Patient not taking: Reported on 10/25/2023) 20 tablet 0   DULoxetine  (CYMBALTA ) 60 MG capsule TAKE 1 CAPSULE (60 MG TOTAL) BY MOUTH 2 (TWO) TIMES DAILY (AM+BEDTIME) 90 capsule 3   fluticasone  (FLONASE ) 50 MCG/ACT nasal spray PLACE 2 SPRAYS INTO BOTH NOSTRILS DAILY. (Patient not taking: Reported on 10/25/2023) 16 g 2   fluticasone  (FLOVENT  HFA) 44 MCG/ACT inhaler Inhale 2 puffs into the lungs 2 (two) times  daily. (Patient not taking: Reported on 10/25/2023) 1 Inhaler 12   gabapentin  (NEURONTIN ) 300 MG capsule TAKE ONE CAPSULE BY MOUTH THREE TIMES DAILY ( AM+NOON+BEDTIME) (Patient not taking: No sig reported) 90 capsule 0   glucose blood (ACCU-CHEK GUIDE) test strip Use to check blood sugar three times daily. (Patient not taking: Reported on 10/25/2023) 100 each 6   hydrOXYzine  (ATARAX ) 10 MG tablet Take 1 tablet (10 mg total) by mouth 3 (three) times daily as needed. (Patient not taking: No sig reported) 90 tablet 3   Insulin  Pen Needle (EASY COMFORT PEN NEEDLES) 31G X 8 MM MISC USE TO INJECT Lantus  once A DAY 100 each 2   LANTUS  SOLOSTAR 100 UNIT/ML Solostar Pen INJECT 56 UNITS INTO THE SKIN DAILY. 45 mL 1   lidocaine  (XYLOCAINE ) 5 % ointment Apply 1 Application topically 4 (four) times daily as needed. (Patient not taking: Reported on 10/25/2023) 35.44 g 0   losartan  (COZAAR ) 50 MG tablet TAKE 1 TABLET (50 MG TOTAL) BY MOUTH DAILY (AM) 90 tablet 1   magic mouthwash (nystatin , lidocaine , diphenhydrAMINE , alum & mag hydroxide) suspension Swish and spit 5 mLs 4 (four) times daily. 180 mL 0   metFORMIN  (GLUCOPHAGE ) 1000 MG tablet TAKE ONE TABLET BY MOUTH TWICE DAILY (AM+BEDTIME) 90 tablet 0   MOUNJARO  2.5 MG/0.5ML Pen INJECT 2.5 MG INTO THE SKIN ONCE A WEEK. 2 mL 1   nicotine  (NICODERM CQ  - DOSED IN MG/24 HR) 7 mg/24hr patch Place 1 patch (7 mg total) onto the skin daily. Remove before bedtime. (Patient not taking: Reported on 10/25/2023) 28 patch 5   nicotine  polacrilex (NICORETTE ) 2 MG gum Take 1 each (2 mg total) by mouth as needed for smoking cessation. Use every 1-2 hours as needed. 100 tablet 3   ondansetron  (ZOFRAN ) 4 MG tablet Take 1 tablet (4 mg total) by mouth every 8 (eight) hours as needed for nausea or vomiting. 20 tablet 0   pantoprazole  (PROTONIX ) 40 MG tablet TAKE 1 TABLET BY MOUTH DAILY (AM) 30 tablet 3   pravastatin  (PRAVACHOL ) 20 MG tablet TAKE 1 TABLET (20 MG TOTAL) BY MOUTH DAILY.  (BEDTIME) 90 tablet 1   promethazine -dextromethorphan  (PROMETHAZINE -DM) 6.25-15 MG/5ML syrup Take 5 mLs by mouth at bedtime as needed for cough. 118 mL 0   SUMAtriptan  (IMITREX ) 50 MG tablet TAKE 1 TABLET (50 MG TOTAL) BY MOUTH EVERY 2 (TWO) HOURS AS NEEDED FOR MIGRAINE. MAY REPEAT IN 2 HOURS IF HEADACHE PERSISTS OR RECURS. 10 tablet 0   traMADol  (ULTRAM ) 50 MG tablet Take 1-2 tablets (50-100 mg total) by mouth daily as needed. 20 tablet 0   traZODone  (DESYREL ) 100 MG tablet Take 1 tablet (100 mg total) by mouth at bedtime. 30 tablet 3   No current facility-administered medications on file prior to visit.    Observations/Objective: There were no vitals taken for this visit.   Assessment and Plan: Diagnoses and all orders for this visit:  Primary osteoarthritis of left knee -  ibuprofen  (ADVIL ) 800 MG tablet; Take 1 tablet (800 mg total) by mouth every 8 (eight) hours as needed.  Type 2 diabetes mellitus without complication, without long-term current use of insulin  (HCC) -     LANTUS  SOLOSTAR 100 UNIT/ML Solostar Pen; Inject 56 Units into the skin daily.  Primary osteoarthritis of right knee -     ibuprofen  (ADVIL ) 800 MG tablet; Take 1 tablet (800 mg total) by mouth every 8 (eight) hours as needed.     Follow Up Instructions:    I discussed the assessment and treatment plan with the patient. The patient was provided an opportunity to ask questions and all were answered. The patient agreed with the plan and demonstrated an understanding of the instructions.   The patient was advised to call back or seek an in-person evaluation if the symptoms worsen or if the condition fails to improve as anticipated.     I provided 22 minutes total time during this encounter including median intraservice time, reviewing previous notes, investigations, ordering medications, medical decision making, coordinating care and patient verbalized understanding at the end of the visit.    This note  has been created with Education officer, environmental. Any transcriptional errors are unintentional.   [name] SHAUNNA Bohr, NP 03/17/2024, 2:47 PM

## 2024-03-17 NOTE — Telephone Encounter (Signed)
 Left VM with pt about their upcoming appt. Pt did not answer

## 2024-03-18 ENCOUNTER — Ambulatory Visit (INDEPENDENT_AMBULATORY_CARE_PROVIDER_SITE_OTHER): Admitting: Primary Care

## 2024-04-14 ENCOUNTER — Ambulatory Visit: Payer: Self-pay

## 2024-04-14 NOTE — Telephone Encounter (Signed)
 FYI Only or Action Required?: FYI only for provider: appointment scheduled on 1/6.  Patient was last seen in primary care on 03/17/2024 by Celestia Rosaline SQUIBB, NP.  Called Nurse Triage reporting Recurrent Skin Infections.  Symptoms began yesterday.  Interventions attempted: Nothing.  Symptoms are: unchanged.  Triage Disposition: See Physician Within 24 Hours  Patient/caregiver understands and will follow disposition?: Yes, will follow disposition  Copied from CRM #[id number]. Topic: Clinical - Red Word Triage >> Apr 14, 2024 10:43 AM Nathanel BROCKS wrote: Red Word that prompted transfer to Nurse Triage:   Pt found knot in her left breast, its at the tip of nipple and always know in arm pit. Very sore, red and hot.   ----------------------------------------------------------------------- From previous Reason for Contact - Scheduling: Patient/patient representative is calling to schedule an appointment. Refer to attachments for appointment information. Reason for Disposition  2 or more boils  Answer Assessment - Initial Assessment Questions 1. APPEARANCE of BOIL: What does the boil look like?      Unsure of color at this time,  2. LOCATION: Where is the boil located?      Armpit and breast 3. NUMBER: How many boils are there?      2 4. SIZE: How big is the boil? (e.g., inches, cm; compare to size of a coin or other object)     Breast one is size of pea, pea size in armpit 5. ONSET: When did the boil start?     yesterday 6. PAIN: Is there any pain? If Yes, ask: How bad is the pain?   (Scale 1-10; or mild, moderate, severe)     10 7. FEVER: Do you have a fever? If Yes, ask: What is it, how was it measured, and when did it start?      denies 8. SOURCE: Have you been around anyone with boils or other Staph infections? Have you ever had boils before?     denies 9. OTHER SYMPTOMS: Do you have any other symptoms? (e.g., shaking chills, weakness, rash elsewhere on  body)     denies  Protocols used: Boil (Skin Abscess)-A-AH

## 2024-04-15 ENCOUNTER — Encounter (INDEPENDENT_AMBULATORY_CARE_PROVIDER_SITE_OTHER): Payer: Self-pay | Admitting: Primary Care

## 2024-04-15 ENCOUNTER — Ambulatory Visit (INDEPENDENT_AMBULATORY_CARE_PROVIDER_SITE_OTHER): Admitting: Primary Care

## 2024-04-15 VITALS — BP 154/88 | HR 89 | Resp 16 | Ht 65.0 in | Wt 236.8 lb

## 2024-04-15 DIAGNOSIS — N6322 Unspecified lump in the left breast, upper inner quadrant: Secondary | ICD-10-CM

## 2024-04-15 DIAGNOSIS — E119 Type 2 diabetes mellitus without complications: Secondary | ICD-10-CM

## 2024-04-15 LAB — POCT GLYCOSYLATED HEMOGLOBIN (HGB A1C): HbA1c, POC (controlled diabetic range): 11.5 % — AB (ref 0.0–7.0)

## 2024-04-15 NOTE — Progress Notes (Signed)
 " [name], is a 54 y.o. female  RDW:[id number]  [id number]  DOB - [birth year]  Chief Complaint  Patient presents with   Diabetes   Rash   Cyst    In left breast        Subjective:   [name] is a 54 y.o. female here today for an acute visit.  No problems updated.  Comprehensive ROS Pertinent positive and negative noted in HPI   Allergies[1]  Past Medical History:  Diagnosis Date   Anxiety    Asthma    Common migraine with intractable migraine 10/02/2016   Depression    Diabetes mellitus    Hyperlipidemia    Hypertension    Sleep apnea     Medications Ordered Prior to Encounter[2] Health Maintenance  Topic Date Due   Pneumococcal Vaccine for age over 41 (1 of 2 - PCV) Never done   Hepatitis B Vaccine (1 of 3 - 19+ 3-dose series) Never done   Zoster (Shingles) Vaccine (1 of 2) Never done   Breast Cancer Screening  03/27/2021   Pap with HPV screening  10/09/2021   Flu Shot  Never done   COVID-19 Vaccine (2 - 2025-26 season) 12/10/2023   Eye exam for diabetics  06/06/2024   Yearly kidney health urinalysis for diabetes  08/13/2024   Complete foot exam   08/13/2024   Hemoglobin A1C  10/13/2024   Yearly kidney function blood test for diabetes  01/25/2025   Colon Cancer Screening  10/24/2028   DTaP/Tdap/Td vaccine (3 - Td or Tdap) 10/13/2029   Hepatitis C Screening  Completed   HIV Screening  Completed   HPV Vaccine  Aged Out   Meningitis B Vaccine  Aged Out    Objective:   Vitals:   04/15/24 1629  BP: (!) 154/88  Pulse: 89  Resp: 16  SpO2: 98%  Weight: 236 lb 12.8 oz (107.4 kg)  Height: 5' 5 (1.651 m)    Physical Exam Vitals reviewed.  Constitutional:      Appearance: Normal appearance. She is obese.  HENT:     Head: Normocephalic.     Right Ear: Tympanic membrane, ear canal and external ear normal.     Left Ear: Tympanic membrane, ear canal and external ear normal.     Nose: Nose normal.      Mouth/Throat:     Mouth: Mucous membranes are moist.  Eyes:     Extraocular Movements: Extraocular movements intact.     Pupils: Pupils are equal, round, and reactive to light.  Cardiovascular:     Rate and Rhythm: Normal rate.  Pulmonary:     Effort: Pulmonary effort is normal.     Breath sounds: Normal breath sounds.  Abdominal:     General: Bowel sounds are normal.     Palpations: Abdomen is soft.  Musculoskeletal:        General: Normal range of motion.     Cervical back: Normal range of motion.  Skin:    General: Skin is warm and dry.  Neurological:     Mental Status: She is alert and oriented to person, place, and time.  Psychiatric:        Mood and Affect: Mood normal.        Behavior: Behavior normal.        Thought Content: Thought content normal.      Assessment & Plan  [name] was seen today for diabetes, rash and cyst.  Diagnoses and all  orders for this visit:  Type 2 diabetes mellitus without complication, without long-term current use of insulin  (HCC) -     POCT glycosylated hemoglobin (Hb A1C)  Mass of upper inner quadrant of left breast -     Cancel: Lipid panel -     Cancel: MM 3D DIAGNOSTIC MAMMOGRAM UNILATERAL LEFT BREAST; Future -     Lipid panel; Future -     MM 3D DIAGNOSTIC MAMMOGRAM BILATERAL BREAST; Future   Patient have been counseled extensively about nutrition and exercise. Other issues discussed during this visit include: low cholesterol diet, weight control and daily exercise, foot care, annual eye examinations at Ophthalmology, importance of adherence with medications and regular follow-up. We also discussed long term complications of uncontrolled diabetes and hypertension.   Return in about 3 months (around 07/14/2024).  The patient was given clear instructions to go to ER or return to medical center if symptoms don't improve, worsen or new problems develop. The patient verbalized understanding. The patient was told to call to get lab results  if they haven't heard anything in the next week.   This note has been created with Education officer, environmental. Any transcriptional errors are unintentional.   [name] SHAUNNA Bohr, NP 04/15/2024, 4:39 PM     [1]  Allergies Allergen Reactions   Lisinopril  Swelling    angioedema   Trulicity  [Dulaglutide ] Other (See Comments)    Gas, abdominal bloating  [2]  Current Outpatient Medications on File Prior to Visit  Medication Sig Dispense Refill   Accu-Chek Softclix Lancets lancets Use to check blood sugar three times daily. 100 each 6   acetaminophen -codeine  (TYLENOL  #3) 300-30 MG tablet Take 1 tablet by mouth 2 (two) times daily as needed. 30 tablet 1   albuterol  (VENTOLIN  HFA) 108 (90 Base) MCG/ACT inhaler Inhale 1-2 puffs into the lungs every 6 (six) hours as needed for up to 14 days for wheezing or shortness of breath. 1 each 0   amLODipine  (NORVASC ) 10 MG tablet TAKE 1 TABLET BY MOUTH DAILY. (AM) 90 tablet 0   aspirin  EC 81 MG tablet Take 81 mg by mouth every 6 (six) hours as needed for mild pain.      atomoxetine  (STRATTERA ) 40 MG capsule Take 1 capsule (40 mg total) by mouth daily. 30 capsule 3   benzonatate  (TESSALON ) 100 MG capsule Take 1 capsule (100 mg total) by mouth every 8 (eight) hours. 21 capsule 0   blood glucose meter kit and supplies Dispense based on patient and insurance preference. Use up to four times daily as directed. (FOR ICD-10 E10.9, E11.9). (Patient not taking: Reported on 10/25/2023) 1 each 0   Blood Glucose Monitoring Suppl (ACCU-CHEK GUIDE) w/Device KIT Use to check blood sugar three times daily. (Patient not taking: Reported on 10/25/2023) 1 kit 0   Butalbital -APAP-Caffeine  50-300-40 MG CAPS Take 1 capsule by mouth as needed.     cetirizine  (ZYRTEC ) 10 MG tablet Take 10 mg by mouth as needed. (Patient not taking: Reported on 10/25/2023)     Continuous Blood Gluc Receiver (FREESTYLE LIBRE 2 READER) DEVI Use to check blood sugar  continuously throughout the day. Change sensors once every 14 days. E11.9 (Patient not taking: Reported on 10/25/2023) 1 each 0   Continuous Blood Gluc Sensor (FREESTYLE LIBRE 2 SENSOR) MISC Use to check blood sugar continuously throughout the day. Change sensors once every 14 days. E11.9 (Patient not taking: Reported on 10/25/2023) 2 each 3   dicyclomine  (BENTYL ) 20  MG tablet Take 1 tablet (20 mg total) by mouth 2 (two) times daily. (Patient not taking: Reported on 10/25/2023) 20 tablet 0   DULoxetine  (CYMBALTA ) 60 MG capsule TAKE 1 CAPSULE (60 MG TOTAL) BY MOUTH 2 (TWO) TIMES DAILY (AM+BEDTIME) 90 capsule 3   fluticasone  (FLONASE ) 50 MCG/ACT nasal spray PLACE 2 SPRAYS INTO BOTH NOSTRILS DAILY. (Patient not taking: Reported on 10/25/2023) 16 g 2   fluticasone  (FLOVENT  HFA) 44 MCG/ACT inhaler Inhale 2 puffs into the lungs 2 (two) times daily. (Patient not taking: Reported on 10/25/2023) 1 Inhaler 12   gabapentin  (NEURONTIN ) 300 MG capsule TAKE ONE CAPSULE BY MOUTH THREE TIMES DAILY ( AM+NOON+BEDTIME) (Patient not taking: No sig reported) 90 capsule 0   glucose blood (ACCU-CHEK GUIDE) test strip Use to check blood sugar three times daily. (Patient not taking: Reported on 10/25/2023) 100 each 6   hydrOXYzine  (ATARAX ) 10 MG tablet Take 1 tablet (10 mg total) by mouth 3 (three) times daily as needed. (Patient not taking: No sig reported) 90 tablet 3   ibuprofen  (ADVIL ) 800 MG tablet Take 1 tablet (800 mg total) by mouth every 8 (eight) hours as needed. 90 tablet 1   Insulin  Pen Needle (EASY COMFORT PEN NEEDLES) 31G X 8 MM MISC USE TO INJECT Lantus  once A DAY 100 each 2   LANTUS  SOLOSTAR 100 UNIT/ML Solostar Pen Inject 56 Units into the skin daily. 45 mL 1   lidocaine  (XYLOCAINE ) 5 % ointment Apply 1 Application topically 4 (four) times daily as needed. (Patient not taking: Reported on 10/25/2023) 35.44 g 0   losartan  (COZAAR ) 50 MG tablet TAKE 1 TABLET (50 MG TOTAL) BY MOUTH DAILY (AM) 90 tablet 1   magic  mouthwash (nystatin , lidocaine , diphenhydrAMINE , alum & mag hydroxide) suspension Swish and spit 5 mLs 4 (four) times daily. 180 mL 0   metFORMIN  (GLUCOPHAGE ) 1000 MG tablet TAKE ONE TABLET BY MOUTH TWICE DAILY (AM+BEDTIME) 90 tablet 0   MOUNJARO  2.5 MG/0.5ML Pen INJECT 2.5 MG INTO THE SKIN ONCE A WEEK. 2 mL 1   nicotine  (NICODERM CQ  - DOSED IN MG/24 HR) 7 mg/24hr patch Place 1 patch (7 mg total) onto the skin daily. Remove before bedtime. (Patient not taking: Reported on 10/25/2023) 28 patch 5   nicotine  polacrilex (NICORETTE ) 2 MG gum Take 1 each (2 mg total) by mouth as needed for smoking cessation. Use every 1-2 hours as needed. 100 tablet 3   ondansetron  (ZOFRAN ) 4 MG tablet Take 1 tablet (4 mg total) by mouth every 8 (eight) hours as needed for nausea or vomiting. 20 tablet 0   pantoprazole  (PROTONIX ) 40 MG tablet TAKE 1 TABLET BY MOUTH DAILY (AM) 30 tablet 3   pravastatin  (PRAVACHOL ) 20 MG tablet TAKE 1 TABLET (20 MG TOTAL) BY MOUTH DAILY. (BEDTIME) 90 tablet 1   promethazine -dextromethorphan  (PROMETHAZINE -DM) 6.25-15 MG/5ML syrup Take 5 mLs by mouth at bedtime as needed for cough. 118 mL 0   SUMAtriptan  (IMITREX ) 50 MG tablet TAKE 1 TABLET (50 MG TOTAL) BY MOUTH EVERY 2 (TWO) HOURS AS NEEDED FOR MIGRAINE. MAY REPEAT IN 2 HOURS IF HEADACHE PERSISTS OR RECURS. 10 tablet 0   traMADol  (ULTRAM ) 50 MG tablet Take 1-2 tablets (50-100 mg total) by mouth daily as needed. 20 tablet 0   traZODone  (DESYREL ) 100 MG tablet Take 1 tablet (100 mg total) by mouth at bedtime. 30 tablet 3   No current facility-administered medications on file prior to visit.   "

## 2024-04-16 ENCOUNTER — Other Ambulatory Visit (INDEPENDENT_AMBULATORY_CARE_PROVIDER_SITE_OTHER): Payer: Self-pay | Admitting: Primary Care

## 2024-04-16 DIAGNOSIS — N6322 Unspecified lump in the left breast, upper inner quadrant: Secondary | ICD-10-CM

## 2024-04-21 ENCOUNTER — Ambulatory Visit (INDEPENDENT_AMBULATORY_CARE_PROVIDER_SITE_OTHER): Payer: Self-pay | Admitting: Primary Care

## 2024-04-23 ENCOUNTER — Telehealth: Payer: Self-pay | Admitting: Physician Assistant

## 2024-04-23 ENCOUNTER — Other Ambulatory Visit: Payer: Self-pay | Admitting: Physician Assistant

## 2024-04-23 ENCOUNTER — Encounter (HOSPITAL_COMMUNITY): Payer: Self-pay | Admitting: Psychiatry

## 2024-04-23 ENCOUNTER — Other Ambulatory Visit

## 2024-04-23 ENCOUNTER — Encounter

## 2024-04-23 ENCOUNTER — Other Ambulatory Visit (INDEPENDENT_AMBULATORY_CARE_PROVIDER_SITE_OTHER): Payer: Self-pay | Admitting: Primary Care

## 2024-04-23 ENCOUNTER — Telehealth (INDEPENDENT_AMBULATORY_CARE_PROVIDER_SITE_OTHER): Admitting: Psychiatry

## 2024-04-23 DIAGNOSIS — M2550 Pain in unspecified joint: Secondary | ICD-10-CM

## 2024-04-23 DIAGNOSIS — F411 Generalized anxiety disorder: Secondary | ICD-10-CM | POA: Diagnosis not present

## 2024-04-23 DIAGNOSIS — Z720 Tobacco use: Secondary | ICD-10-CM | POA: Diagnosis not present

## 2024-04-23 DIAGNOSIS — R41840 Attention and concentration deficit: Secondary | ICD-10-CM | POA: Diagnosis not present

## 2024-04-23 DIAGNOSIS — F3341 Major depressive disorder, recurrent, in partial remission: Secondary | ICD-10-CM

## 2024-04-23 DIAGNOSIS — F4329 Adjustment disorder with other symptoms: Secondary | ICD-10-CM | POA: Diagnosis not present

## 2024-04-23 MED ORDER — TRAMADOL HCL 50 MG PO TABS: 50.0000 mg | ORAL_TABLET | Freq: Two times a day (BID) | ORAL | 0 refills | Status: DC | PRN

## 2024-04-23 MED ORDER — DULOXETINE HCL 60 MG PO CPEP: ORAL_CAPSULE | ORAL | 3 refills | Status: AC

## 2024-04-23 MED ORDER — NICOTINE POLACRILEX 2 MG MT GUM: 2.0000 mg | CHEWING_GUM | OROMUCOSAL | 3 refills | Status: AC | PRN

## 2024-04-23 MED ORDER — ATOMOXETINE HCL 40 MG PO CAPS: 40.0000 mg | ORAL_CAPSULE | Freq: Every day | ORAL | 3 refills | Status: AC

## 2024-04-23 MED ORDER — TRAZODONE HCL 100 MG PO TABS: 100.0000 mg | ORAL_TABLET | Freq: Every day | ORAL | 3 refills | Status: AC

## 2024-04-23 NOTE — Progress Notes (Signed)
 BH MD/PA/NP OP Progress Note Virtual Visit via Video Note  I connected with [name] on 04/23/2024 at  2:30 PM EST by a video enabled telemedicine application and verified that I am speaking with the correct person using two identifiers.  Location: Patient: Home Provider: Clinic   I discussed the limitations of evaluation and management by telemedicine and the availability of in person appointments. The patient expressed understanding and agreed to proceed.  I provided 30 minutes of non-face-to-face time during this encounter.    04/23/2024 2:43 PM [name]  MRN:  [id number]  Chief Complaint: I am doing okay  HPI: 54 year old female seen today for follow-up psychiatric evaluation.   She has a psychiatric history of tobacco dependence (in remission) depression and prolonged grief.  Currently she is managed on Strattera  40 mg daily, Nicoderm 2 mg gum, and trazodone  100 mg nightly, and Cymbalta  60 mg twice daily.  She notes her medications are effective in managing her psychiatric conditions.  Today patient was well-groomed, pleasant, cooperative, and engaged in conversation.  She informed clinical research associate that she is doing okay.  She notes that she is spending the day with her daughter.  She is looking forward to her birthday reporting that her children will be taking her out.  Since her last visit she notes that her anxiety and depression continues to be well-managed.  Today provider conducted a GAD-7 and patient scored a 4, at her last visit she scored a 0.  Provider also conducted PHQ-9 and she score of 4, at her last visit she scored a 4.  She endorsed adequate sleep and appetite.  Today she denies SI/HI/VAH, mania, or paranoia.   Patient continues to have chronic pain.  She quantifies her pain today is 5 out of 10.  No medication changes made today.  Patient agreeable to continue medication as prescribed.  No other concerns noted at this time.    Visit Diagnosis:    ICD-10-CM   1.  Poor concentration  R41.840 atomoxetine  (STRATTERA ) 40 MG capsule    2. Tobacco use  Z72.0 nicotine  polacrilex (NICORETTE ) 2 MG gum    3. MDD (major depressive disorder), recurrent, in partial remission  F33.41 traZODone  (DESYREL ) 100 MG tablet    DULoxetine  (CYMBALTA ) 60 MG capsule    4. Generalized anxiety disorder  F41.1 DULoxetine  (CYMBALTA ) 60 MG capsule    5. 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 DULoxetine  (CYMBALTA ) 60 MG capsule            Past Psychiatric History:  MDD, grief with prolonged bereavement reaction, tobacco dependence  Past Medical History:  Past Medical History:  Diagnosis Date   Anxiety    Asthma    Common migraine with intractable migraine 10/02/2016   Depression    Diabetes mellitus    Hyperlipidemia    Hypertension    Sleep apnea     Past Surgical History:  Procedure Laterality Date   CHOLECYSTECTOMY     TUBAL LIGATION      Family Psychiatric History: Denied any family psychiatric history  Family History:  Family History  Problem Relation Age of Onset   Diabetes Mother    Hypertension Mother    Renal Disease Mother    Colon cancer Father    Cancer Father    Diabetes Sister    Diabetes Sister    Diabetes Brother    Renal Disease Brother    Diabetes Brother    Diabetes Brother    Diabetes Brother  Diabetes Brother    Healthy Daughter    Healthy Daughter    Rectal cancer Neg Hx    Stomach cancer Neg Hx    Esophageal cancer Neg Hx     Social History:  Social History   Socioeconomic History   Marital status: Single    Spouse name: Not on file   Number of children: Not on file   Years of education: Not on file   Highest education level: Not on file  Occupational History   Not on file  Tobacco Use   Smoking status: Every Day    Current packs/day: 0.25    Types: Cigarettes   Smokeless tobacco: Never   Tobacco comments:    Smoking 2-3 per day.  Trying to quit.  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  Vaping Use   Vaping  status: Former  Substance and Sexual Activity   Alcohol use: No   Drug use: No   Sexual activity: Not on file  Other Topics Concern   Not on file  Social History Narrative   Right handed    Lives with daughter   Caffeine  use: Drinks coffee/tea/soda sometimes   Social Drivers of Health   Tobacco Use: High Risk (04/23/2024)   Patient History    Smoking Tobacco Use: Every Day    Smokeless Tobacco Use: Never    Passive Exposure: Not on file  Financial Resource Strain: Low Risk (06/30/2022)   Overall Financial Resource Strain (CARDIA)    Difficulty of Paying Living Expenses: Not hard at all  Food Insecurity: No Food Insecurity (04/15/2024)   Epic    Worried About Radiation Protection Practitioner of Food in the Last Year: Never true    Ran Out of Food in the Last Year: Never true  Transportation Needs: No Transportation Needs (04/15/2024)   Epic    Lack of Transportation (Medical): No    Lack of Transportation (Non-Medical): No  Physical Activity: Sufficiently Active (06/30/2022)   Exercise Vital Sign    Days of Exercise per Week: 5 days    Minutes of Exercise per Session: 30 min  Stress: No Stress Concern Present (06/30/2022)   [employer] of Occupational Health - Occupational Stress Questionnaire    Feeling of Stress : Not at all  Social Connections: Socially Integrated (06/30/2022)   Social Connection and Isolation Panel    Frequency of Communication with Friends and Family: More than three times a week    Frequency of Social Gatherings with Friends and Family: More than three times a week    Attends Religious Services: 1 to 4 times per year    Active Member of Clubs or Organizations: Yes    Attends Banker Meetings: 1 to 4 times per year    Marital Status: Living with partner  Depression (PHQ2-9): Low Risk (04/23/2024)   Depression (PHQ2-9)    PHQ-2 Score: 4  Recent Concern: Depression (PHQ2-9) - Medium Risk (04/15/2024)   Depression (PHQ2-9)    PHQ-2 Score: 7  Alcohol Screen: Low  Risk (06/30/2022)   Alcohol Screen    Last Alcohol Screening Score (AUDIT): 1  Housing: Low Risk (04/15/2024)   Epic    Unable to Pay for Housing in the Last Year: No    Number of Times Moved in the Last Year: 0    Homeless in the Last Year: No  Utilities: Not At Risk (04/15/2024)   Epic    Threatened with loss of utilities: No  Health Literacy: Not on file    Allergies:  Allergies  Allergen Reactions   Lisinopril  Swelling    angioedema   Trulicity  [Dulaglutide ] Other (See Comments)    Gas, abdominal bloating    Metabolic Disorder Labs: Lab Results  Component Value Date   HGBA1C 11.5 (A) 04/15/2024   No results found for: PROLACTIN Lab Results  Component Value Date   CHOL 206 (H) 08/14/2023   TRIG 117 08/14/2023   HDL 48 08/14/2023   CHOLHDL 4.3 08/14/2023   LDLCALC 137 (H) 08/14/2023   LDLCALC 131 (H) 05/29/2022   Lab Results  Component Value Date   TSH 2.870 10/02/2018   TSH 4.840 (H) 01/09/2018    Therapeutic Level Labs: No results found for: LITHIUM No results found for: VALPROATE No results found for: CBMZ  Current Medications: Current Outpatient Medications  Medication Sig Dispense Refill   Accu-Chek Softclix Lancets lancets Use to check blood sugar three times daily. 100 each 6   acetaminophen -codeine  (TYLENOL  #3) 300-30 MG tablet Take 1 tablet by mouth 2 (two) times daily as needed. 30 tablet 1   albuterol  (VENTOLIN  HFA) 108 (90 Base) MCG/ACT inhaler Inhale 1-2 puffs into the lungs every 6 (six) hours as needed for up to 14 days for wheezing or shortness of breath. 1 each 0   amLODipine  (NORVASC ) 10 MG tablet TAKE 1 TABLET BY MOUTH DAILY. (AM) 90 tablet 0   aspirin  EC 81 MG tablet Take 81 mg by mouth every 6 (six) hours as needed for mild pain.      atomoxetine  (STRATTERA ) 40 MG capsule Take 1 capsule (40 mg total) by mouth daily. 30 capsule 3   benzonatate  (TESSALON ) 100 MG capsule Take 1 capsule (100 mg total) by mouth every 8 (eight) hours. 21  capsule 0   blood glucose meter kit and supplies Dispense based on patient and insurance preference. Use up to four times daily as directed. (FOR ICD-10 E10.9, E11.9). (Patient not taking: Reported on 10/25/2023) 1 each 0   Blood Glucose Monitoring Suppl (ACCU-CHEK GUIDE) w/Device KIT Use to check blood sugar three times daily. (Patient not taking: Reported on 10/25/2023) 1 kit 0   Butalbital -APAP-Caffeine  50-300-40 MG CAPS Take 1 capsule by mouth as needed.     cetirizine  (ZYRTEC ) 10 MG tablet Take 10 mg by mouth as needed. (Patient not taking: Reported on 10/25/2023)     Continuous Blood Gluc Receiver (FREESTYLE LIBRE 2 READER) DEVI Use to check blood sugar continuously throughout the day. Change sensors once every 14 days. E11.9 (Patient not taking: Reported on 10/25/2023) 1 each 0   Continuous Blood Gluc Sensor (FREESTYLE LIBRE 2 SENSOR) MISC Use to check blood sugar continuously throughout the day. Change sensors once every 14 days. E11.9 (Patient not taking: Reported on 10/25/2023) 2 each 3   dicyclomine  (BENTYL ) 20 MG tablet Take 1 tablet (20 mg total) by mouth 2 (two) times daily. (Patient not taking: Reported on 10/25/2023) 20 tablet 0   DULoxetine  (CYMBALTA ) 60 MG capsule TAKE 1 CAPSULE (60 MG TOTAL) BY MOUTH 2 (TWO) TIMES DAILY (AM+BEDTIME) 90 capsule 3   fluticasone  (FLONASE ) 50 MCG/ACT nasal spray PLACE 2 SPRAYS INTO BOTH NOSTRILS DAILY. (Patient not taking: Reported on 10/25/2023) 16 g 2   fluticasone  (FLOVENT  HFA) 44 MCG/ACT inhaler Inhale 2 puffs into the lungs 2 (two) times daily. (Patient not taking: Reported on 10/25/2023) 1 Inhaler 12   gabapentin  (NEURONTIN ) 300 MG capsule TAKE ONE CAPSULE BY MOUTH THREE TIMES DAILY ( AM+NOON+BEDTIME) (Patient not taking: No sig reported) 90 capsule 0  glucose blood (ACCU-CHEK GUIDE) test strip Use to check blood sugar three times daily. (Patient not taking: Reported on 10/25/2023) 100 each 6   hydrOXYzine  (ATARAX ) 10 MG tablet Take 1 tablet (10 mg total)  by mouth 3 (three) times daily as needed. (Patient not taking: No sig reported) 90 tablet 3   ibuprofen  (ADVIL ) 800 MG tablet Take 1 tablet (800 mg total) by mouth every 8 (eight) hours as needed. 90 tablet 1   Insulin  Pen Needle (EASY COMFORT PEN NEEDLES) 31G X 8 MM MISC USE TO INJECT Lantus  once A DAY 100 each 2   LANTUS  SOLOSTAR 100 UNIT/ML Solostar Pen Inject 56 Units into the skin daily. 45 mL 1   lidocaine  (XYLOCAINE ) 5 % ointment Apply 1 Application topically 4 (four) times daily as needed. (Patient not taking: Reported on 10/25/2023) 35.44 g 0   losartan  (COZAAR ) 50 MG tablet TAKE 1 TABLET (50 MG TOTAL) BY MOUTH DAILY (AM) 90 tablet 1   magic mouthwash (nystatin , lidocaine , diphenhydrAMINE , alum & mag hydroxide) suspension Swish and spit 5 mLs 4 (four) times daily. 180 mL 0   metFORMIN  (GLUCOPHAGE ) 1000 MG tablet TAKE ONE TABLET BY MOUTH TWICE DAILY (AM+BEDTIME) 90 tablet 0   MOUNJARO  2.5 MG/0.5ML Pen INJECT 2.5 MG INTO THE SKIN ONCE A WEEK. 2 mL 1   nicotine  (NICODERM CQ  - DOSED IN MG/24 HR) 7 mg/24hr patch Place 1 patch (7 mg total) onto the skin daily. Remove before bedtime. (Patient not taking: Reported on 10/25/2023) 28 patch 5   nicotine  polacrilex (NICORETTE ) 2 MG gum Take 1 each (2 mg total) by mouth as needed for smoking cessation. Use every 1-2 hours as needed. 100 tablet 3   ondansetron  (ZOFRAN ) 4 MG tablet Take 1 tablet (4 mg total) by mouth every 8 (eight) hours as needed for nausea or vomiting. 20 tablet 0   pantoprazole  (PROTONIX ) 40 MG tablet TAKE 1 TABLET BY MOUTH DAILY (AM) 30 tablet 3   pravastatin  (PRAVACHOL ) 20 MG tablet TAKE 1 TABLET (20 MG TOTAL) BY MOUTH DAILY. (BEDTIME) 90 tablet 1   promethazine -dextromethorphan  (PROMETHAZINE -DM) 6.25-15 MG/5ML syrup Take 5 mLs by mouth at bedtime as needed for cough. 118 mL 0   SUMAtriptan  (IMITREX ) 50 MG tablet TAKE 1 TABLET (50 MG TOTAL) BY MOUTH EVERY 2 (TWO) HOURS AS NEEDED FOR MIGRAINE. MAY REPEAT IN 2 HOURS IF HEADACHE PERSISTS  OR RECURS. 10 tablet 0   traMADol  (ULTRAM ) 50 MG tablet Take 1-2 tablets (50-100 mg total) by mouth daily as needed. 20 tablet 0   traZODone  (DESYREL ) 100 MG tablet Take 1 tablet (100 mg total) by mouth at bedtime. 30 tablet 3   No current facility-administered medications for this visit.     Musculoskeletal: Strength & Muscle Tone: within normal limits and telehealth visit Gait & Station: normal, telehealth visit Patient leans: N/A  Psychiatric Specialty Exam: Review of Systems  There were no vitals taken for this visit.There is no height or weight on file to calculate BMI.  General Appearance: Well Groomed  Eye Contact:  Good  Speech:  Clear and Coherent and Normal Rate  Volume:  Normal  Mood:  Euthymic,   Affect:  Appropriate and Congruent  Thought Process:  Coherent, Goal Directed, and Linear  Orientation:  Full (Time, Place, and Person)  Thought Content: WDL and Logical   Suicidal Thoughts:  No  Homicidal Thoughts:  No  Memory:  Immediate;   Good Recent;   Good Remote;   Good  Judgement:  Good  Insight:  Good  Psychomotor Activity:  Normal  Concentration:  Concentration: Good and Attention Span: Good  Recall:  Good  Fund of Knowledge: Good  Language: Good  Akathisia:  No  Handed:  Right  AIMS (if indicated): not done  Assets:  Communication Skills Desire for Improvement Financial Resources/Insurance Housing Leisure Time Physical Health Social Support  ADL's:  Intact  Cognition: WNL  Sleep:  Good   Screenings: GAD-7    Flowsheet Row Video Visit from 04/23/2024 in [facility] Video Visit from 02/08/2024 in [facility] Office Visit from 08/14/2023 in [facility] Video Visit from 05/25/2023 in [facility] Office Visit from 04/23/2023 in [facility]  Total GAD-7 Score 4 0 8 10 0   PHQ2-9    Flowsheet Row Video Visit from 04/23/2024 in  [facility] Office Visit from 04/15/2024 in [facility] Video Visit from 02/08/2024 in [facility] Office Visit from 08/14/2023 in [facility] Video Visit from 05/25/2023 in [facility]  PHQ-2 Total Score 0 2 2 4  0  PHQ-9 Total Score 4 7 4 8 4    Flowsheet Row ED from 01/26/2024 in [facility] UC from 01/09/2024 in [facility] UC from 12/20/2023 in [facility] RISK CATEGORY No Risk No Risk No Risk     Assessment and Plan: Patient reports that her sleep, anxiety, depression are well-managed.  No medication changes made today.  Patient agreeable to taking medication as prescribed.  1. MDD (major depressive disorder), recurrent, in partial remission  Continue- traZODone  (DESYREL ) 100 MG tablet; Take 1 tablet (100 mg total) by mouth at bedtime.  Dispense: 30 tablet; Refill: 3 Continue- DULoxetine  (CYMBALTA ) 60 MG capsule; TAKE 1 CAPSULE (60 MG TOTAL) BY MOUTH 2 (TWO) TIMES DAILY (AM+BEDTIME)  Dispense: 90 capsule; Refill: 3  2. Generalized anxiety disorder  Continue- DULoxetine  (CYMBALTA ) 60 MG capsule; TAKE 1 CAPSULE (60 MG TOTAL) BY MOUTH 2 (TWO) TIMES DAILY (AM+BEDTIME)  Dispense: 90 capsule; Refill: 3  3. Grief reaction with prolonged bereavement  Continue- DULoxetine  (CYMBALTA ) 60 MG capsule; TAKE 1 CAPSULE (60 MG TOTAL) BY MOUTH 2 (TWO) TIMES DAILY (AM+BEDTIME)  Dispense: 90 capsule; Refill: 3.  4. Tobacco use (Primary)  Continue- nicotine  polacrilex (NICORETTE ) 2 MG gum; Take 1 each (2 mg total) by mouth as needed for smoking cessation. Use every 1-2 hours as needed.  Dispense: 100 tablet; Refill: 3  5. Poor concentration  Continue- atomoxetine  (STRATTERA ) 40 MG capsule; Take 1 capsule (40 mg total) by mouth daily.  Dispense: 30 capsule; Refill:  3     Follow-up in 2 months Zane FORBES Bach, NP 04/23/2024, 2:43 PM

## 2024-04-23 NOTE — Telephone Encounter (Signed)
 Pt called asking for a refill of tramadol . Please send to [facility]. Pt number is [phone].

## 2024-04-23 NOTE — Telephone Encounter (Signed)
 sent

## 2024-04-24 ENCOUNTER — Other Ambulatory Visit (INDEPENDENT_AMBULATORY_CARE_PROVIDER_SITE_OTHER): Payer: Self-pay | Admitting: Primary Care

## 2024-04-24 DIAGNOSIS — M1711 Unilateral primary osteoarthritis, right knee: Secondary | ICD-10-CM

## 2024-04-24 DIAGNOSIS — M1712 Unilateral primary osteoarthritis, left knee: Secondary | ICD-10-CM

## 2024-04-24 DIAGNOSIS — E119 Type 2 diabetes mellitus without complications: Secondary | ICD-10-CM

## 2024-04-24 NOTE — Telephone Encounter (Signed)
 Will forward to provider

## 2024-04-24 NOTE — Telephone Encounter (Signed)
 D/C 03/17/24 Requested Prescriptions  Refused Prescriptions Disp Refills   celecoxib  (CELEBREX ) 200 MG capsule [Pharmacy Med Name: CELECOXIB  200 MG ORAL CAPSULE] 60 capsule 0    Sig: TAKE ONE CAPSULE BY MOUTH TWICE DAILY (AM+BEDTIME)     Analgesics:  COX2 Inhibitors Failed - 04/24/2024  3:41 PM      Failed - Manual Review: Labs are only required if the patient has taken medication for more than 8 weeks.      Failed - ALT in normal range and within 360 days    ALT  Date Value Ref Range Status  01/26/2024 47 (H) 0 - 44 U/L Final         Passed - HGB in normal range and within 360 days    Hemoglobin  Date Value Ref Range Status  01/26/2024 14.1 12.0 - 15.0 g/dL Final  90/69/7977 84.7 11.1 - 15.9 g/dL Final         Passed - Cr in normal range and within 360 days    Creatinine, Ser  Date Value Ref Range Status  01/26/2024 0.80 0.44 - 1.00 mg/dL Final         Passed - HCT in normal range and within 360 days    HCT  Date Value Ref Range Status  01/26/2024 43.7 36.0 - 46.0 % Final   Hematocrit  Date Value Ref Range Status  01/07/2021 46.1 34.0 - 46.6 % Final         Passed - AST in normal range and within 360 days    AST  Date Value Ref Range Status  01/26/2024 33 15 - 41 U/L Final    Comment:    HEMOLYSIS AT THIS LEVEL MAY AFFECT RESULT         Passed - eGFR is 30 or above and within 360 days    GFR calc Af Amer  Date Value Ref Range Status  12/07/2019 >60 >60 mL/min Final   GFR, Estimated  Date Value Ref Range Status  01/26/2024 >60 >60 mL/min Final    Comment:    (NOTE) Calculated using the CKD-EPI Creatinine Equation (2021)    eGFR  Date Value Ref Range Status  05/29/2022 101 >59 mL/min/1.73 Final         Passed - Patient is not pregnant      Passed - Valid encounter within last 12 months    Recent Outpatient Visits           1 week ago Type 2 diabetes mellitus without complication, without long-term current use of insulin  (HCC)   [place]  [facility] Celestia Rosaline SQUIBB, NP   1 month ago Primary osteoarthritis of left knee   [place] [facility] Celestia Rosaline SQUIBB, NP   6 months ago Primary osteoarthritis of both knees   [place] [facility] Celestia Rosaline SQUIBB, NP   8 months ago Type 2 diabetes mellitus without complication, without long-term current use of insulin  (HCC)   [place] [facility] Celestia Rosaline SQUIBB, NP   1 year ago Type 2 diabetes mellitus without complication, without long-term current use of insulin  [facility])   [place] [facility] Celestia Rosaline SQUIBB, NP

## 2024-04-25 ENCOUNTER — Ambulatory Visit
Admission: RE | Admit: 2024-04-25 | Discharge: 2024-04-25 | Disposition: A | Discharge status: Home / Self Care | Source: Ambulatory Visit | Attending: Primary Care | Admitting: Primary Care

## 2024-04-25 DIAGNOSIS — N6322 Unspecified lump in the left breast, upper inner quadrant: Secondary | ICD-10-CM

## 2024-04-25 DIAGNOSIS — N6310 Unspecified lump in the right breast, unspecified quadrant: Secondary | ICD-10-CM

## 2024-05-07 ENCOUNTER — Other Ambulatory Visit: Payer: Self-pay | Admitting: Physician Assistant

## 2024-05-07 NOTE — Telephone Encounter (Signed)
 I will send in one more rx but will then need to refer to pcp/pain mgmt for additional refills

## 2024-05-16 ENCOUNTER — Encounter: Payer: Self-pay | Admitting: Pharmacist

## 2024-05-16 ENCOUNTER — Ambulatory Visit: Payer: Self-pay | Admitting: Pharmacist

## 2024-05-16 ENCOUNTER — Telehealth: Payer: Self-pay | Admitting: Pharmacist

## 2024-05-16 VITALS — BP 137/81 | HR 90 | Wt 239.6 lb

## 2024-05-16 DIAGNOSIS — E119 Type 2 diabetes mellitus without complications: Secondary | ICD-10-CM

## 2024-05-16 DIAGNOSIS — Z794 Long term (current) use of insulin: Secondary | ICD-10-CM

## 2024-05-16 DIAGNOSIS — Z7984 Long term (current) use of oral hypoglycemic drugs: Secondary | ICD-10-CM

## 2024-05-16 DIAGNOSIS — Z7985 Long-term (current) use of injectable non-insulin antidiabetic drugs: Secondary | ICD-10-CM

## 2024-05-16 MED ORDER — ACCU-CHEK SOFTCLIX LANCETS MISC: 6 refills | Status: AC

## 2024-05-16 MED ORDER — TIRZEPATIDE 5 MG/0.5ML ~~LOC~~ SOAJ: 5.0000 mg | SUBCUTANEOUS | 0 refills | Status: AC

## 2024-05-16 MED ORDER — ACCU-CHEK GUIDE W/DEVICE KIT: PACK | 0 refills | Status: AC

## 2024-05-16 MED ORDER — ACCU-CHEK GUIDE TEST VI STRP: ORAL_STRIP | 6 refills | Status: AC

## 2024-05-16 MED ORDER — FREESTYLE LIBRE 3 PLUS SENSOR MISC: 6 refills | Status: AC

## 2024-05-16 NOTE — Telephone Encounter (Signed)
 Can we start a PA for this patient's Libre 3 + sensors? She has Medicaid and is on insulin .

## 2024-05-16 NOTE — Progress Notes (Cosign Needed)
 "   S:     No chief complaint on file.  54 y.o. female who presents for diabetes evaluation, education, and management. PMH is significant for migraines,T2DM, benign intracranial HTN, osteoarthritis.   Patient was last seen and referred by Rosaline Bohr on 04/15/2024. BP at that visit was 154/88 mmHg. A1c at that appt was 11.5%. Of note, her A1c ranged from 9.9 to 11.4% in 2025 before her most recent result of 11.5%. Last time it was close to goal was in June of 2024 (was 7.1% at that time).   I have not seen her since early 2024. Last visit with me was 08/04/2022. I worked with her in 2024 to get Ozempic  covered. She was on this until ~06/2023. Per my review of her chart, Ozempic  2 mg was changed to Mounjaro  2.5 mg ~08/2023.  Today, patient arrives in good spirits and presents without any assistance. She is doing well. She is tolerating her Mounjaro  without any NV or abdominal pain. No changes in vision. She is also needing new supplies for CBG testing at home.   Family history: T2DM, HTN, renal disease Social History: 1-2 cigarettes daily   Current diabetes medications include: metformin  1000 mg BID, Ozempic  1 mg weekly, Lantus  56 units daily Current hyperlipidemia medications include: pravastatin  20 mg daily Current antihypertensive medications include: amlodipine  10 mg daily, losartan  50 mg daily Patient reports adherence to taking all medications as prescribed.   Insurance coverage: [place] Medicaid  Patient denies hypoglycemic events.  Reported home blood sugars: not checking regularly. Needs new supplies.   Patient denies polyuria. Patient denies neuropathy (nerve pain). Patient denies visual changes. Patient reports self foot exams.   Patient reported dietary habits: none reported   Patient-reported exercise habits: none reported   O:  No CGM or GM with her at today's visit.   Lab Results  Component Value Date   HGBA1C 11.5 (A) 04/15/2024   Vitals:   05/16/24 0935  BP:  137/81  Pulse: 90   Lipid Panel     Component Value Date/Time   CHOL 206 (H) 08/14/2023 1132   TRIG 117 08/14/2023 1132   HDL 48 08/14/2023 1132   CHOLHDL 4.3 08/14/2023 1132   LDLCALC 137 (H) 08/14/2023 1132    Clinical Atherosclerotic Cardiovascular Disease (ASCVD): No  The 10-year ASCVD risk score (Arnett DK, et al., 2019) is: 31.6%   Values used to calculate the score:     Age: 25 years     Clinically relevant sex: Female     Is Non-Hispanic African American: Yes     Diabetic: Yes     Tobacco smoker: Yes     Systolic Blood Pressure: 137 mmHg     Is BP treated: Yes     HDL Cholesterol: 48 mg/dL     Total Cholesterol: 206 mg/dL   Patient is participating in a Managed Medicaid Plan:  Yes   A/P: Diabetes longstanding currently uncontrolled. Patient is able to verbalize appropriate hypoglycemia management plan. Medication adherence appears appropriate. -Continue Lantus  (insulin  glargine) 56 units daily in the morning.  -Increased dose of Mounjaro  to 5 mg weekly. -Continued metformin  1000 mg BID.  -Accu Chek Guide supplies sent. Libre CGM sent as well.  -Patient educated on purpose, proper use, and potential adverse effects of Mounjaro .  -Extensively discussed pathophysiology of diabetes, recommended lifestyle interventions, dietary effects on blood sugar control.  -Counseled on s/sx of and management of hypoglycemia.  -Next A1c anticipated 07/2024.   Written patient instructions provided.  Patient verbalized understanding of treatment plan.  Total time in face to face counseling 30 minutes.    Follow-up:  Pharmacist prn. PCP clinic visit in 2-3 weeks for A1c check.   Herlene Fleeta Morris, PharmD, JAQUELINE, CPP Clinical Pharmacist [facility] [phone]  "

## 2024-06-13 ENCOUNTER — Ambulatory Visit: Payer: Self-pay | Admitting: Pharmacist

## 2024-06-18 ENCOUNTER — Telehealth (HOSPITAL_COMMUNITY): Admitting: Psychiatry

## 2024-07-14 ENCOUNTER — Ambulatory Visit (INDEPENDENT_AMBULATORY_CARE_PROVIDER_SITE_OTHER): Payer: Self-pay | Admitting: Primary Care
